# Patient Record
Sex: Female | Born: 1955
Health system: Southern US, Community
[De-identification: ages and names within clinical notes are randomized; demographics above are authoritative.]

## PROBLEM LIST (undated history)

## (undated) DIAGNOSIS — Z0000 Encounter for general adult medical examination without abnormal findings: Secondary | ICD-10-CM

## (undated) DIAGNOSIS — T7840XA Allergy, unspecified, initial encounter: Secondary | ICD-10-CM

## (undated) DIAGNOSIS — F418 Other specified anxiety disorders: Secondary | ICD-10-CM

## (undated) DIAGNOSIS — R911 Solitary pulmonary nodule: Secondary | ICD-10-CM

## (undated) DIAGNOSIS — E782 Mixed hyperlipidemia: Secondary | ICD-10-CM

## (undated) DIAGNOSIS — F419 Anxiety disorder, unspecified: Secondary | ICD-10-CM

## (undated) DIAGNOSIS — G43909 Migraine, unspecified, not intractable, without status migrainosus: Secondary | ICD-10-CM

## (undated) DIAGNOSIS — Z9289 Personal history of other medical treatment: Secondary | ICD-10-CM

## (undated) DIAGNOSIS — E663 Overweight: Secondary | ICD-10-CM

## (undated) DIAGNOSIS — F329 Major depressive disorder, single episode, unspecified: Secondary | ICD-10-CM

## (undated) DIAGNOSIS — F1010 Alcohol abuse, uncomplicated: Secondary | ICD-10-CM

## (undated) DIAGNOSIS — D689 Coagulation defect, unspecified: Secondary | ICD-10-CM

## (undated) DIAGNOSIS — Z9889 Other specified postprocedural states: Secondary | ICD-10-CM

## (undated) DIAGNOSIS — Z7189 Other specified counseling: Secondary | ICD-10-CM

## (undated) DIAGNOSIS — D693 Immune thrombocytopenic purpura: Secondary | ICD-10-CM

## (undated) DIAGNOSIS — K219 Gastro-esophageal reflux disease without esophagitis: Secondary | ICD-10-CM

## (undated) DIAGNOSIS — R112 Nausea with vomiting, unspecified: Secondary | ICD-10-CM

## (undated) DIAGNOSIS — M797 Fibromyalgia: Secondary | ICD-10-CM

## (undated) DIAGNOSIS — D110 Benign neoplasm of parotid gland: Secondary | ICD-10-CM

## (undated) HISTORY — DX: Encounter for general adult medical examination without abnormal findings: Z00.00

## (undated) HISTORY — DX: Other specified anxiety disorders: F41.8

## (undated) HISTORY — DX: Mixed hyperlipidemia: E78.2

## (undated) HISTORY — PX: LIPOMA EXCISION: SHX5283

## (undated) HISTORY — DX: Gastro-esophageal reflux disease without esophagitis: K21.9

## (undated) HISTORY — DX: Coagulation defect, unspecified: D68.9

## (undated) HISTORY — DX: Allergy, unspecified, initial encounter: T78.40XA

## (undated) HISTORY — DX: Benign neoplasm of parotid gland: D11.0

## (undated) HISTORY — PX: IRRIGATION AND DEBRIDEMENT ABSCESS: SHX5252

## (undated) HISTORY — DX: Immune thrombocytopenic purpura: D69.3

## (undated) HISTORY — PX: PAROTID GLAND TUMOR EXCISION: SHX5221

## (undated) HISTORY — DX: Overweight: E66.3

## (undated) HISTORY — DX: Other specified counseling: Z71.89

## (undated) HISTORY — DX: Solitary pulmonary nodule: R91.1

---

## 1993-12-03 HISTORY — PX: SPLENECTOMY, TOTAL: SHX788

## 1993-12-03 HISTORY — PX: TOTAL ABDOMINAL HYSTERECTOMY: SHX209

## 1993-12-03 HISTORY — PX: APPENDECTOMY: SHX54

## 2000-10-07 ENCOUNTER — Encounter: Payer: Self-pay | Admitting: Hematology & Oncology

## 2000-10-07 ENCOUNTER — Encounter: Admission: RE | Admit: 2000-10-07 | Discharge: 2000-10-07 | Payer: Self-pay | Admitting: Hematology & Oncology

## 2003-03-22 ENCOUNTER — Encounter: Payer: Self-pay | Admitting: Hematology & Oncology

## 2003-03-22 ENCOUNTER — Encounter: Admission: RE | Admit: 2003-03-22 | Discharge: 2003-03-22 | Payer: Self-pay | Admitting: Hematology & Oncology

## 2004-10-10 ENCOUNTER — Ambulatory Visit: Payer: Self-pay | Admitting: Hematology & Oncology

## 2005-01-01 ENCOUNTER — Encounter: Admission: RE | Admit: 2005-01-01 | Discharge: 2005-01-01 | Payer: Self-pay | Admitting: Hematology & Oncology

## 2005-05-31 ENCOUNTER — Ambulatory Visit: Payer: Self-pay | Admitting: Hematology & Oncology

## 2005-07-17 ENCOUNTER — Ambulatory Visit: Payer: Self-pay | Admitting: Hematology & Oncology

## 2005-11-15 ENCOUNTER — Ambulatory Visit: Payer: Self-pay | Admitting: Hematology & Oncology

## 2006-01-08 ENCOUNTER — Ambulatory Visit (HOSPITAL_COMMUNITY): Admission: RE | Admit: 2006-01-08 | Discharge: 2006-01-08 | Payer: Self-pay | Admitting: Otolaryngology

## 2006-01-09 ENCOUNTER — Ambulatory Visit: Payer: Self-pay | Admitting: Hematology & Oncology

## 2006-02-04 ENCOUNTER — Ambulatory Visit (HOSPITAL_COMMUNITY): Admission: RE | Admit: 2006-02-04 | Discharge: 2006-02-05 | Payer: Self-pay | Admitting: Otolaryngology

## 2006-02-04 ENCOUNTER — Encounter (INDEPENDENT_AMBULATORY_CARE_PROVIDER_SITE_OTHER): Payer: Self-pay | Admitting: *Deleted

## 2006-03-14 ENCOUNTER — Ambulatory Visit: Payer: Self-pay | Admitting: Hematology & Oncology

## 2006-03-15 LAB — CBC WITH DIFFERENTIAL/PLATELET
BASO%: 1.9 % (ref 0.0–2.0)
Basophils Absolute: 0.1 10*3/uL (ref 0.0–0.1)
Eosinophils Absolute: 0.1 10*3/uL (ref 0.0–0.5)
HCT: 38.3 % (ref 34.8–46.6)
HGB: 13.1 g/dL (ref 11.6–15.9)
LYMPH%: 31.1 % (ref 14.0–48.0)
MONO#: 0.8 10*3/uL (ref 0.1–0.9)
NEUT#: 4.1 10*3/uL (ref 1.5–6.5)
NEUT%: 54.3 % (ref 39.6–76.8)
Platelets: 22 10*3/uL — ABNORMAL LOW (ref 145–400)
WBC: 7.5 10*3/uL (ref 3.9–10.0)
lymph#: 2.3 10*3/uL (ref 0.9–3.3)

## 2006-03-15 LAB — URINALYSIS, MICROSCOPIC - CHCC
Bilirubin (Urine): NEGATIVE
Ketones: NEGATIVE mg/dL
Protein: NEGATIVE mg/dL
Specific Gravity, Urine: 1.015 (ref 1.003–1.035)
pH: 5 (ref 4.6–8.0)

## 2006-03-29 LAB — CBC WITH DIFFERENTIAL/PLATELET
BASO%: 1.6 % (ref 0.0–2.0)
HCT: 37.1 % (ref 34.8–46.6)
MCHC: 33.4 g/dL (ref 32.0–36.0)
MONO#: 0.7 10*3/uL (ref 0.1–0.9)
NEUT%: 49.5 % (ref 39.6–76.8)
RDW: 13.9 % (ref 11.3–14.5)
WBC: 6.8 10*3/uL (ref 3.9–10.0)
lymph#: 2.5 10*3/uL (ref 0.9–3.3)

## 2006-04-04 LAB — CBC WITH DIFFERENTIAL/PLATELET
BASO%: 0.7 % (ref 0.0–2.0)
Basophils Absolute: 0.1 10*3/uL (ref 0.0–0.1)
HCT: 35.9 % (ref 34.8–46.6)
HGB: 12.5 g/dL (ref 11.6–15.9)
LYMPH%: 34.4 % (ref 14.0–48.0)
MCHC: 34.9 g/dL (ref 32.0–36.0)
MONO#: 1.9 10*3/uL — ABNORMAL HIGH (ref 0.1–0.9)
NEUT%: 53.6 % (ref 39.6–76.8)
Platelets: 170 10*3/uL (ref 145–400)
WBC: 18.7 10*3/uL — ABNORMAL HIGH (ref 3.9–10.0)

## 2006-04-04 LAB — MORPHOLOGY: PLT EST: ADEQUATE

## 2006-04-12 LAB — CBC WITH DIFFERENTIAL/PLATELET
BASO%: 0.9 % (ref 0.0–2.0)
EOS%: 1.2 % (ref 0.0–7.0)
Eosinophils Absolute: 0.1 10*3/uL (ref 0.0–0.5)
MCV: 89.4 fL (ref 81.0–101.0)
MONO%: 12.5 % (ref 0.0–13.0)
NEUT#: 6.5 10*3/uL (ref 1.5–6.5)
RBC: 4.14 10*6/uL (ref 3.70–5.32)
RDW: 13.9 % (ref 11.3–14.5)

## 2006-04-12 LAB — MORPHOLOGY: PLT EST: DECREASED

## 2006-04-16 ENCOUNTER — Ambulatory Visit (HOSPITAL_COMMUNITY): Admission: RE | Admit: 2006-04-16 | Discharge: 2006-04-16 | Payer: Self-pay | Admitting: Hematology & Oncology

## 2006-04-18 LAB — CBC WITH DIFFERENTIAL/PLATELET
BASO%: 0.1 % (ref 0.0–2.0)
Basophils Absolute: 0 10e3/uL (ref 0.0–0.1)
EOS%: 0.1 % (ref 0.0–7.0)
Eosinophils Absolute: 0 10e3/uL (ref 0.0–0.5)
HCT: 39.8 % (ref 34.8–46.6)
HGB: 13.4 g/dL (ref 11.6–15.9)
LYMPH%: 9.7 % — ABNORMAL LOW (ref 14.0–48.0)
MCH: 30.2 pg (ref 26.0–34.0)
MCHC: 33.7 g/dL (ref 32.0–36.0)
MCV: 89.6 fL (ref 81.0–101.0)
MONO#: 0.1 10e3/uL (ref 0.1–0.9)
MONO%: 0.3 % (ref 0.0–13.0)
NEUT#: 16.1 10e3/uL — ABNORMAL HIGH (ref 1.5–6.5)
NEUT%: 89.8 % — ABNORMAL HIGH (ref 39.6–76.8)
Platelets: 192 10e3/uL (ref 145–400)
RBC: 4.45 10e6/uL (ref 3.70–5.32)
RDW: 14.1 % (ref 11.3–14.5)
WBC: 17.9 10e3/uL — ABNORMAL HIGH (ref 3.9–10.0)
lymph#: 1.7 10e3/uL (ref 0.9–3.3)

## 2006-04-18 LAB — MORPHOLOGY
PLT EST: ADEQUATE
RBC Comments: NORMAL

## 2006-04-26 LAB — CBC WITH DIFFERENTIAL/PLATELET
Basophils Absolute: 0.2 10*3/uL — ABNORMAL HIGH (ref 0.0–0.1)
Eosinophils Absolute: 0.3 10*3/uL (ref 0.0–0.5)
LYMPH%: 26.8 % (ref 14.0–48.0)
MCH: 30.2 pg (ref 26.0–34.0)
MCHC: 32.9 g/dL (ref 32.0–36.0)
MCV: 91.7 fL (ref 81.0–101.0)
RDW: 13.4 % (ref 11.3–14.5)
lymph#: 3.3 10*3/uL (ref 0.9–3.3)

## 2006-04-26 LAB — URINALYSIS, MICROSCOPIC - CHCC
Blood: NEGATIVE
Ketones: NEGATIVE mg/dL
Protein: NEGATIVE mg/dL
Specific Gravity, Urine: 1.01 (ref 1.003–1.035)
pH: 5 (ref 4.6–8.0)

## 2006-04-26 LAB — MORPHOLOGY: PLT EST: ADEQUATE

## 2006-04-27 ENCOUNTER — Ambulatory Visit: Payer: Self-pay | Admitting: Hematology & Oncology

## 2006-05-02 LAB — CBC WITH DIFFERENTIAL/PLATELET
Eosinophils Absolute: 0 10*3/uL (ref 0.0–0.5)
HCT: 38.7 % (ref 34.8–46.6)
LYMPH%: 5.9 % — ABNORMAL LOW (ref 14.0–48.0)
MONO#: 0.4 10*3/uL (ref 0.1–0.9)
NEUT#: 13.1 10*3/uL — ABNORMAL HIGH (ref 1.5–6.5)
NEUT%: 91.1 % — ABNORMAL HIGH (ref 39.6–76.8)
Platelets: 30 10*3/uL — ABNORMAL LOW (ref 145–400)
RBC: 4.3 10*6/uL (ref 3.70–5.32)
WBC: 14.4 10*3/uL — ABNORMAL HIGH (ref 3.9–10.0)

## 2006-05-02 LAB — CHCC SMEAR

## 2006-05-02 LAB — MORPHOLOGY: RBC Comments: NORMAL

## 2006-05-09 LAB — CBC WITH DIFFERENTIAL/PLATELET
BASO%: 0.9 % (ref 0.0–2.0)
Eosinophils Absolute: 0.2 10*3/uL (ref 0.0–0.5)
MCHC: 33.2 g/dL (ref 32.0–36.0)
MONO#: 1.2 10*3/uL — ABNORMAL HIGH (ref 0.1–0.9)
NEUT#: 4.8 10*3/uL (ref 1.5–6.5)
RBC: 4.29 10*6/uL (ref 3.70–5.32)
RDW: 12.4 % (ref 11.3–14.5)
WBC: 10.3 10*3/uL — ABNORMAL HIGH (ref 3.9–10.0)

## 2006-05-09 LAB — CHCC SMEAR

## 2006-05-09 LAB — MORPHOLOGY

## 2006-05-16 LAB — CBC WITH DIFFERENTIAL/PLATELET
Basophils Absolute: 0.2 10*3/uL — ABNORMAL HIGH (ref 0.0–0.1)
Eosinophils Absolute: 0.1 10*3/uL (ref 0.0–0.5)
HGB: 13.9 g/dL (ref 11.6–15.9)
MCV: 91 fL (ref 81.0–101.0)
MONO#: 1.5 10*3/uL — ABNORMAL HIGH (ref 0.1–0.9)
MONO%: 16.8 % — ABNORMAL HIGH (ref 0.0–13.0)
NEUT#: 4.9 10*3/uL (ref 1.5–6.5)
RBC: 4.6 10*6/uL (ref 3.70–5.32)
RDW: 12.4 % (ref 11.3–14.5)
WBC: 8.8 10*3/uL (ref 3.9–10.0)
lymph#: 2.1 10*3/uL (ref 0.9–3.3)

## 2006-05-16 LAB — CHCC SMEAR

## 2006-05-16 LAB — TECHNOLOGIST REVIEW

## 2006-05-23 LAB — CBC WITH DIFFERENTIAL/PLATELET
Basophils Absolute: 0.1 10*3/uL (ref 0.0–0.1)
EOS%: 1.9 % (ref 0.0–7.0)
Eosinophils Absolute: 0.1 10*3/uL (ref 0.0–0.5)
HCT: 40.1 % (ref 34.8–46.6)
HGB: 13.4 g/dL (ref 11.6–15.9)
MCH: 30.1 pg (ref 26.0–34.0)
MONO#: 0.7 10*3/uL (ref 0.1–0.9)
NEUT#: 2.5 10*3/uL (ref 1.5–6.5)
NEUT%: 43.2 % (ref 39.6–76.8)
RDW: 11.9 % (ref 11.3–14.5)
WBC: 5.9 10*3/uL (ref 3.9–10.0)
lymph#: 2.4 10*3/uL (ref 0.9–3.3)

## 2006-05-30 LAB — CBC WITH DIFFERENTIAL/PLATELET
Basophils Absolute: 0.1 10*3/uL (ref 0.0–0.1)
Eosinophils Absolute: 0.1 10*3/uL (ref 0.0–0.5)
HGB: 13.5 g/dL (ref 11.6–15.9)
LYMPH%: 43.3 % (ref 14.0–48.0)
MCV: 90.1 fL (ref 81.0–101.0)
MONO#: 0.8 10*3/uL (ref 0.1–0.9)
MONO%: 11.8 % (ref 0.0–13.0)
NEUT#: 2.9 10*3/uL (ref 1.5–6.5)
Platelets: 8 10*3/uL — CL (ref 145–400)
RDW: 12.2 % (ref 11.3–14.5)

## 2006-06-13 ENCOUNTER — Ambulatory Visit: Payer: Self-pay | Admitting: Hematology & Oncology

## 2006-06-13 LAB — CBC WITH DIFFERENTIAL/PLATELET
BASO%: 1.8 % (ref 0.0–2.0)
Basophils Absolute: 0.1 10*3/uL (ref 0.0–0.1)
EOS%: 1.4 % (ref 0.0–7.0)
HGB: 14.2 g/dL (ref 11.6–15.9)
MCH: 29.9 pg (ref 26.0–34.0)
MCHC: 33.5 g/dL (ref 32.0–36.0)
RBC: 4.74 10*6/uL (ref 3.70–5.32)
RDW: 12.2 % (ref 11.3–14.5)
lymph#: 3 10*3/uL (ref 0.9–3.3)

## 2006-06-13 LAB — CHCC SMEAR

## 2006-07-04 LAB — CBC WITH DIFFERENTIAL/PLATELET
BASO%: 1.9 % (ref 0.0–2.0)
EOS%: 1.6 % (ref 0.0–7.0)
HGB: 12.7 g/dL (ref 11.6–15.9)
MCH: 29.8 pg (ref 26.0–34.0)
MCHC: 33.5 g/dL (ref 32.0–36.0)
RDW: 13.6 % (ref 11.3–14.5)
WBC: 6.9 10*3/uL (ref 3.9–10.0)
lymph#: 2.9 10*3/uL (ref 0.9–3.3)

## 2006-07-04 LAB — CHCC SMEAR

## 2006-07-25 LAB — CBC WITH DIFFERENTIAL/PLATELET
Basophils Absolute: 0.1 10*3/uL (ref 0.0–0.1)
Eosinophils Absolute: 0 10*3/uL (ref 0.0–0.5)
HGB: 13.6 g/dL (ref 11.6–15.9)
MCV: 89.3 fL (ref 81.0–101.0)
NEUT#: 3.4 10*3/uL (ref 1.5–6.5)
RDW: 13.3 % (ref 11.3–14.5)
lymph#: 2.5 10*3/uL (ref 0.9–3.3)

## 2006-08-13 ENCOUNTER — Ambulatory Visit: Payer: Self-pay | Admitting: Hematology & Oncology

## 2006-08-15 LAB — CBC WITH DIFFERENTIAL/PLATELET
Eosinophils Absolute: 0 10*3/uL (ref 0.0–0.5)
HCT: 38.8 % (ref 34.8–46.6)
HGB: 13.4 g/dL (ref 11.6–15.9)
LYMPH%: 25.9 % (ref 14.0–48.0)
MONO#: 0.7 10*3/uL (ref 0.1–0.9)
NEUT#: 4.5 10*3/uL (ref 1.5–6.5)
NEUT%: 63.5 % (ref 39.6–76.8)
Platelets: 6 10*3/uL — CL (ref 145–400)
WBC: 7 10*3/uL (ref 3.9–10.0)

## 2006-08-15 LAB — CHCC SMEAR

## 2006-08-27 LAB — CBC WITH DIFFERENTIAL/PLATELET
BASO%: 0.9 % (ref 0.0–2.0)
EOS%: 1.4 % (ref 0.0–7.0)
HCT: 38.3 % (ref 34.8–46.6)
MCH: 29.9 pg (ref 26.0–34.0)
MCHC: 34 g/dL (ref 32.0–36.0)
MONO%: 12.2 % (ref 0.0–13.0)
NEUT%: 52.4 % (ref 39.6–76.8)
lymph#: 2.4 10*3/uL (ref 0.9–3.3)

## 2006-10-07 ENCOUNTER — Ambulatory Visit: Payer: Self-pay | Admitting: Hematology & Oncology

## 2006-10-09 LAB — CBC WITH DIFFERENTIAL/PLATELET
BASO%: 1.1 % (ref 0.0–2.0)
EOS%: 1.5 % (ref 0.0–7.0)
Eosinophils Absolute: 0.1 10*3/uL (ref 0.0–0.5)
LYMPH%: 30.5 % (ref 14.0–48.0)
MCH: 29.8 pg (ref 26.0–34.0)
MCHC: 33.7 g/dL (ref 32.0–36.0)
MCV: 88.2 fL (ref 81.0–101.0)
MONO%: 14.2 % — ABNORMAL HIGH (ref 0.0–13.0)
Platelets: 54 10*3/uL — ABNORMAL LOW (ref 145–400)
RBC: 4.69 10*6/uL (ref 3.70–5.32)
RDW: 13.4 % (ref 11.3–14.5)

## 2006-10-09 LAB — CHCC SMEAR

## 2006-10-30 LAB — CBC WITH DIFFERENTIAL/PLATELET
BASO%: 1.2 % (ref 0.0–2.0)
EOS%: 1.3 % (ref 0.0–7.0)
HCT: 38.5 % (ref 34.8–46.6)
MCH: 30.1 pg (ref 26.0–34.0)
MCHC: 34.2 g/dL (ref 32.0–36.0)
NEUT%: 54.1 % (ref 39.6–76.8)
RBC: 4.37 10*6/uL (ref 3.70–5.32)
lymph#: 1.9 10*3/uL (ref 0.9–3.3)

## 2006-11-20 LAB — CBC WITH DIFFERENTIAL/PLATELET
BASO%: 0.9 % (ref 0.0–2.0)
Basophils Absolute: 0.1 10*3/uL (ref 0.0–0.1)
EOS%: 2.1 % (ref 0.0–7.0)
HCT: 41.6 % (ref 34.8–46.6)
HGB: 13.9 g/dL (ref 11.6–15.9)
LYMPH%: 19 % (ref 14.0–48.0)
MCH: 29.7 pg (ref 26.0–34.0)
MCHC: 33.4 g/dL (ref 32.0–36.0)
MCV: 89 fL (ref 81.0–101.0)
MONO%: 11.8 % (ref 0.0–13.0)
NEUT%: 66.2 % (ref 39.6–76.8)

## 2006-12-03 HISTORY — PX: COLONOSCOPY: SHX174

## 2006-12-13 ENCOUNTER — Ambulatory Visit: Payer: Self-pay | Admitting: Hematology & Oncology

## 2006-12-17 LAB — CBC WITH DIFFERENTIAL/PLATELET
BASO%: 0.6 % (ref 0.0–2.0)
Eosinophils Absolute: 0.1 10*3/uL (ref 0.0–0.5)
HCT: 39.6 % (ref 34.8–46.6)
MCHC: 33.7 g/dL (ref 32.0–36.0)
MONO#: 0.9 10*3/uL (ref 0.1–0.9)
NEUT#: 4.1 10*3/uL (ref 1.5–6.5)
NEUT%: 54.6 % (ref 39.6–76.8)
Platelets: 74 10*3/uL — ABNORMAL LOW (ref 145–400)
RBC: 4.45 10*6/uL (ref 3.70–5.32)
WBC: 7.4 10*3/uL (ref 3.9–10.0)
lymph#: 2.4 10*3/uL (ref 0.9–3.3)

## 2006-12-17 LAB — CHCC SMEAR

## 2007-01-22 LAB — CBC WITH DIFFERENTIAL/PLATELET
Eosinophils Absolute: 0.1 10*3/uL (ref 0.0–0.5)
HCT: 39.9 % (ref 34.8–46.6)
HGB: 13.7 g/dL (ref 11.6–15.9)
LYMPH%: 29 % (ref 14.0–48.0)
MONO#: 1.2 10*3/uL — ABNORMAL HIGH (ref 0.1–0.9)
NEUT#: 4 10*3/uL (ref 1.5–6.5)
NEUT%: 52 % (ref 39.6–76.8)
Platelets: 159 10*3/uL (ref 145–400)
WBC: 7.8 10*3/uL (ref 3.9–10.0)
lymph#: 2.3 10*3/uL (ref 0.9–3.3)

## 2007-03-03 ENCOUNTER — Ambulatory Visit: Payer: Self-pay | Admitting: Hematology & Oncology

## 2007-03-05 LAB — CBC WITH DIFFERENTIAL/PLATELET
Basophils Absolute: 0.1 10*3/uL (ref 0.0–0.1)
EOS%: 1 % (ref 0.0–7.0)
Eosinophils Absolute: 0.1 10*3/uL (ref 0.0–0.5)
HCT: 39 % (ref 34.8–46.6)
HGB: 13.4 g/dL (ref 11.6–15.9)
MCH: 30.6 pg (ref 26.0–34.0)
MCV: 88.8 fL (ref 81.0–101.0)
MONO%: 14 % — ABNORMAL HIGH (ref 0.0–13.0)
NEUT#: 3.4 10*3/uL (ref 1.5–6.5)
NEUT%: 51.2 % (ref 39.6–76.8)
lymph#: 2.2 10*3/uL (ref 0.9–3.3)

## 2007-03-26 LAB — CBC WITH DIFFERENTIAL/PLATELET
Eosinophils Absolute: 0.1 10*3/uL (ref 0.0–0.5)
HCT: 38.3 % (ref 34.8–46.6)
LYMPH%: 36.3 % (ref 14.0–48.0)
MCV: 88.9 fL (ref 81.0–101.0)
MONO#: 0.9 10*3/uL (ref 0.1–0.9)
MONO%: 13.6 % — ABNORMAL HIGH (ref 0.0–13.0)
NEUT#: 3.1 10*3/uL (ref 1.5–6.5)
NEUT%: 47.4 % (ref 39.6–76.8)
Platelets: 74 10*3/uL — ABNORMAL LOW (ref 145–400)
RBC: 4.31 10*6/uL (ref 3.70–5.32)
WBC: 6.5 10*3/uL (ref 3.9–10.0)

## 2007-04-24 ENCOUNTER — Encounter: Admission: RE | Admit: 2007-04-24 | Discharge: 2007-04-24 | Payer: Self-pay | Admitting: Hematology & Oncology

## 2007-04-30 ENCOUNTER — Ambulatory Visit: Payer: Self-pay | Admitting: Hematology & Oncology

## 2007-05-02 LAB — CHCC SMEAR

## 2007-05-02 LAB — CBC WITH DIFFERENTIAL/PLATELET
BASO%: 0.8 % (ref 0.0–2.0)
EOS%: 1.4 % (ref 0.0–7.0)
HCT: 38.6 % (ref 34.8–46.6)
LYMPH%: 29.1 % (ref 14.0–48.0)
MCH: 30.7 pg (ref 26.0–34.0)
MCHC: 34.8 g/dL (ref 32.0–36.0)
MCV: 88.2 fL (ref 81.0–101.0)
MONO%: 15 % — ABNORMAL HIGH (ref 0.0–13.0)
NEUT%: 53.7 % (ref 39.6–76.8)
Platelets: 63 10*3/uL — ABNORMAL LOW (ref 145–400)
RBC: 4.37 10*6/uL (ref 3.70–5.32)
WBC: 6.5 10*3/uL (ref 3.9–10.0)

## 2007-06-16 ENCOUNTER — Ambulatory Visit: Payer: Self-pay | Admitting: Hematology & Oncology

## 2007-06-18 LAB — CBC WITH DIFFERENTIAL/PLATELET
EOS%: 1.3 % (ref 0.0–7.0)
MCH: 31 pg (ref 26.0–34.0)
MCV: 87.7 fL (ref 81.0–101.0)
MONO%: 11.9 % (ref 0.0–13.0)
NEUT#: 4.1 10*3/uL (ref 1.5–6.5)
RBC: 4.31 10*6/uL (ref 3.70–5.32)
RDW: 13 % (ref 11.3–14.5)

## 2007-07-09 ENCOUNTER — Observation Stay (HOSPITAL_COMMUNITY): Admission: EM | Admit: 2007-07-09 | Discharge: 2007-07-10 | Payer: Self-pay | Admitting: Emergency Medicine

## 2007-08-13 ENCOUNTER — Other Ambulatory Visit: Payer: Self-pay | Admitting: Emergency Medicine

## 2007-08-14 ENCOUNTER — Inpatient Hospital Stay (HOSPITAL_COMMUNITY): Admission: AD | Admit: 2007-08-14 | Discharge: 2007-08-16 | Payer: Self-pay | Admitting: Surgery

## 2007-10-01 ENCOUNTER — Ambulatory Visit: Payer: Self-pay | Admitting: Hematology & Oncology

## 2007-10-03 LAB — CBC WITH DIFFERENTIAL/PLATELET
Eosinophils Absolute: 0.1 10*3/uL (ref 0.0–0.5)
MONO#: 0.9 10*3/uL (ref 0.1–0.9)
NEUT#: 3.9 10*3/uL (ref 1.5–6.5)
RBC: 4.4 10*6/uL (ref 3.70–5.32)
RDW: 13.4 % (ref 11.3–14.5)
WBC: 6.9 10*3/uL (ref 3.9–10.0)

## 2007-10-03 LAB — CHCC SMEAR

## 2007-11-20 ENCOUNTER — Ambulatory Visit: Payer: Self-pay | Admitting: Hematology & Oncology

## 2007-11-24 LAB — CHCC SMEAR

## 2007-11-24 LAB — CBC WITH DIFFERENTIAL/PLATELET
BASO%: 0.4 % (ref 0.0–2.0)
HCT: 41 % (ref 34.8–46.6)
HGB: 13.9 g/dL (ref 11.6–15.9)
MCHC: 34 g/dL (ref 32.0–36.0)
MONO#: 1 10*3/uL — ABNORMAL HIGH (ref 0.1–0.9)
NEUT%: 52.4 % (ref 39.6–76.8)
WBC: 7.2 10*3/uL (ref 3.9–10.0)
lymph#: 2.4 10*3/uL (ref 0.9–3.3)

## 2008-01-21 ENCOUNTER — Ambulatory Visit: Payer: Self-pay | Admitting: Hematology & Oncology

## 2008-01-26 LAB — COMPREHENSIVE METABOLIC PANEL
Alkaline Phosphatase: 100 U/L (ref 39–117)
CO2: 25 mEq/L (ref 19–32)
Calcium: 9.5 mg/dL (ref 8.4–10.5)
Creatinine, Ser: 0.66 mg/dL (ref 0.40–1.20)
Glucose, Bld: 83 mg/dL (ref 70–99)
Potassium: 4.4 mEq/L (ref 3.5–5.3)
Sodium: 138 mEq/L (ref 135–145)
Total Bilirubin: 0.4 mg/dL (ref 0.3–1.2)

## 2008-01-26 LAB — CBC WITH DIFFERENTIAL/PLATELET
Eosinophils Absolute: 0.1 10*3/uL (ref 0.0–0.5)
MCH: 30.2 pg (ref 26.0–34.0)
MCHC: 34.4 g/dL (ref 32.0–36.0)
MCV: 87.9 fL (ref 81.0–101.0)
MONO#: 0.8 10*3/uL (ref 0.1–0.9)
MONO%: 12.3 % (ref 0.0–13.0)
Platelets: 52 10*3/uL — ABNORMAL LOW (ref 145–400)
RBC: 4.52 10*6/uL (ref 3.70–5.32)
lymph#: 2 10*3/uL (ref 0.9–3.3)

## 2008-01-26 LAB — TSH: TSH: 2.587 u[IU]/mL (ref 0.350–5.500)

## 2008-03-16 ENCOUNTER — Ambulatory Visit: Payer: Self-pay | Admitting: Hematology & Oncology

## 2008-03-16 LAB — CBC WITH DIFFERENTIAL/PLATELET
Eosinophils Absolute: 0.2 10*3/uL (ref 0.0–0.5)
HGB: 13 g/dL (ref 11.6–15.9)
LYMPH%: 33.2 % (ref 14.0–48.0)
MCH: 30.7 pg (ref 26.0–34.0)
MCHC: 34.2 g/dL (ref 32.0–36.0)
MCV: 89.6 fL (ref 81.0–101.0)
MONO#: 0.9 10*3/uL (ref 0.1–0.9)
NEUT#: 3.6 10*3/uL (ref 1.5–6.5)
NEUT%: 50.3 % (ref 39.6–76.8)
RBC: 4.25 10*6/uL (ref 3.70–5.32)
RDW: 13.7 % (ref 11.3–14.5)
lymph#: 2.4 10*3/uL (ref 0.9–3.3)

## 2008-04-05 LAB — CBC WITH DIFFERENTIAL/PLATELET
BASO%: 1.7 % (ref 0.0–2.0)
Basophils Absolute: 0.1 10*3/uL (ref 0.0–0.1)
Eosinophils Absolute: 0.2 10*3/uL (ref 0.0–0.5)
MCH: 30.5 pg (ref 26.0–34.0)
RBC: 4.22 10*6/uL (ref 3.70–5.32)
lymph#: 2.8 10*3/uL (ref 0.9–3.3)

## 2008-05-03 ENCOUNTER — Ambulatory Visit: Payer: Self-pay | Admitting: Hematology & Oncology

## 2008-05-07 ENCOUNTER — Encounter: Admission: RE | Admit: 2008-05-07 | Discharge: 2008-05-07 | Payer: Self-pay | Admitting: Hematology & Oncology

## 2008-06-01 LAB — COMPREHENSIVE METABOLIC PANEL
ALT: 17 U/L (ref 0–35)
AST: 19 U/L (ref 0–37)
Alkaline Phosphatase: 107 U/L (ref 39–117)
BUN: 17 mg/dL (ref 6–23)
CO2: 25 mEq/L (ref 19–32)
Chloride: 104 mEq/L (ref 96–112)
Glucose, Bld: 96 mg/dL (ref 70–99)
Sodium: 140 mEq/L (ref 135–145)

## 2008-06-01 LAB — CBC WITH DIFFERENTIAL/PLATELET
Basophils Absolute: 0.1 10*3/uL (ref 0.0–0.1)
Eosinophils Absolute: 0.2 10*3/uL (ref 0.0–0.5)
HCT: 39.9 % (ref 34.8–46.6)
LYMPH%: 26.5 % (ref 14.0–48.0)
MCH: 31 pg (ref 26.0–34.0)
MCHC: 34.3 g/dL (ref 32.0–36.0)
MCV: 90.3 fL (ref 81.0–101.0)
MONO#: 0.7 10*3/uL (ref 0.1–0.9)
MONO%: 9.6 % (ref 0.0–13.0)
NEUT#: 4.2 10*3/uL (ref 1.5–6.5)
Platelets: 57 10*3/uL — ABNORMAL LOW (ref 145–400)
WBC: 6.9 10*3/uL (ref 3.9–10.0)
lymph#: 1.8 10*3/uL (ref 0.9–3.3)

## 2008-10-01 ENCOUNTER — Ambulatory Visit: Payer: Self-pay | Admitting: Hematology & Oncology

## 2008-10-04 LAB — CBC WITH DIFFERENTIAL (CANCER CENTER ONLY)
BASO#: 0.1 10*3/uL (ref 0.0–0.2)
Eosinophils Absolute: 0.2 10*3/uL (ref 0.0–0.5)
HGB: 13.2 g/dL (ref 11.6–15.9)
LYMPH%: 32 % (ref 14.0–48.0)
MCH: 30.1 pg (ref 26.0–34.0)
MCV: 90 fL (ref 81–101)
MONO%: 10.6 % (ref 0.0–13.0)
NEUT%: 53.8 % (ref 39.6–80.0)
RBC: 4.39 10*6/uL (ref 3.70–5.32)

## 2008-10-04 LAB — CMP (CANCER CENTER ONLY)
AST: 24 U/L (ref 11–38)
BUN, Bld: 15 mg/dL (ref 7–22)
CO2: 31 mEq/L (ref 18–33)
Total Bilirubin: 0.5 mg/dl (ref 0.20–1.60)

## 2008-12-10 ENCOUNTER — Ambulatory Visit: Payer: Self-pay | Admitting: Hematology & Oncology

## 2008-12-10 LAB — CMP (CANCER CENTER ONLY)
BUN, Bld: 16 mg/dL (ref 7–22)
CO2: 29 mEq/L (ref 18–33)
Chloride: 101 mEq/L (ref 98–108)
Creat: 0.7 mg/dl (ref 0.6–1.2)
Glucose, Bld: 97 mg/dL (ref 73–118)
Total Bilirubin: 0.6 mg/dl (ref 0.20–1.60)

## 2008-12-10 LAB — CBC WITH DIFFERENTIAL (CANCER CENTER ONLY)
BASO#: 0.1 10*3/uL (ref 0.0–0.2)
Eosinophils Absolute: 0.2 10*3/uL (ref 0.0–0.5)
HGB: 13.5 g/dL (ref 11.6–15.9)
MCH: 30.9 pg (ref 26.0–34.0)
MONO%: 10.6 % (ref 0.0–13.0)
NEUT#: 3.6 10*3/uL (ref 1.5–6.5)
RBC: 4.37 10*6/uL (ref 3.70–5.32)

## 2009-02-04 ENCOUNTER — Ambulatory Visit: Payer: Self-pay | Admitting: Hematology & Oncology

## 2009-02-04 LAB — URINALYSIS, MICROSCOPIC (CHCC SATELLITE)
Leukocyte Esterase: NEGATIVE
Nitrite: NEGATIVE
Protein: NEGATIVE mg/dL
Specific Gravity, Urine: 1.01 (ref 1.003–1.035)
pH: 5 (ref 4.60–8.00)

## 2009-02-06 LAB — URINE CULTURE

## 2009-03-15 LAB — CBC WITH DIFFERENTIAL (CANCER CENTER ONLY)
BASO#: 0 10*3/uL (ref 0.0–0.2)
EOS%: 2.7 % (ref 0.0–7.0)
Eosinophils Absolute: 0.1 10*3/uL (ref 0.0–0.5)
HGB: 13.2 g/dL (ref 11.6–15.9)
LYMPH#: 1.7 10*3/uL (ref 0.9–3.3)
MCHC: 32 g/dL (ref 32.0–36.0)
MONO#: 0.4 10*3/uL (ref 0.1–0.9)
NEUT#: 2.4 10*3/uL (ref 1.5–6.5)
RBC: 4.43 10*6/uL (ref 3.70–5.32)
WBC: 4.6 10*3/uL (ref 3.9–10.0)

## 2009-05-09 ENCOUNTER — Encounter: Admission: RE | Admit: 2009-05-09 | Discharge: 2009-05-09 | Payer: Self-pay | Admitting: Hematology & Oncology

## 2009-12-06 ENCOUNTER — Ambulatory Visit: Payer: Self-pay | Admitting: Hematology & Oncology

## 2009-12-07 ENCOUNTER — Ambulatory Visit: Payer: Self-pay | Admitting: Radiology

## 2009-12-07 ENCOUNTER — Ambulatory Visit (HOSPITAL_BASED_OUTPATIENT_CLINIC_OR_DEPARTMENT_OTHER): Admission: RE | Admit: 2009-12-07 | Discharge: 2009-12-07 | Payer: Self-pay | Admitting: Hematology & Oncology

## 2009-12-07 LAB — CBC WITH DIFFERENTIAL (CANCER CENTER ONLY)
LYMPH#: 2.5 10*3/uL (ref 0.9–3.3)
LYMPH%: 57.2 % — ABNORMAL HIGH (ref 14.0–48.0)
MCHC: 33.6 g/dL (ref 32.0–36.0)
MCV: 91 fL (ref 81–101)
MONO%: 15.9 % — ABNORMAL HIGH (ref 0.0–13.0)
NEUT%: 19 % — ABNORMAL LOW (ref 39.6–80.0)
RBC: 4.09 10*6/uL (ref 3.70–5.32)
RDW: 11.2 % (ref 10.5–14.6)

## 2009-12-19 LAB — CBC WITH DIFFERENTIAL (CANCER CENTER ONLY)
BASO#: 0.1 10*3/uL (ref 0.0–0.2)
Eosinophils Absolute: 0.2 10*3/uL (ref 0.0–0.5)
HGB: 12.8 g/dL (ref 11.6–15.9)
LYMPH#: 2.6 10*3/uL (ref 0.9–3.3)
MCH: 30.1 pg (ref 26.0–34.0)
MCHC: 33.1 g/dL (ref 32.0–36.0)
MCV: 91 fL (ref 81–101)
MONO#: 0.5 10*3/uL (ref 0.1–0.9)
NEUT#: 2.4 10*3/uL (ref 1.5–6.5)
NEUT%: 42.6 % (ref 39.6–80.0)
Platelets: 135 10*3/uL — ABNORMAL LOW (ref 145–400)
RBC: 4.25 10*6/uL (ref 3.70–5.32)

## 2009-12-19 LAB — CHCC SATELLITE - SMEAR

## 2010-06-20 ENCOUNTER — Encounter: Admission: RE | Admit: 2010-06-20 | Discharge: 2010-06-20 | Payer: Self-pay | Admitting: Hematology & Oncology

## 2010-12-03 HISTORY — PX: BREAST BIOPSY: SHX20

## 2011-04-17 NOTE — Op Note (Signed)
Laura Bender, OPPERMAN             ACCOUNT NO.:  [id number]   MEDICAL RECORD NO.:  [id number]          PATIENT TYPE:  INP   LOCATION:  NA                           FACILITY:  [facility]   PHYSICIAN:  Thomas A. Cornett, M.D.DATE OF BIRTH:  16-Sep-1956   DATE OF PROCEDURE:  08/15/2007  DATE OF DISCHARGE:                               OPERATIVE REPORT   PREOPERATIVE DIAGNOSIS:  Right buttock abscess.   POSTOPERATIVE DIAGNOSIS:  Right buttock abscess.   PROCEDURE:  Incision and drainage of complex right buttock abscess.   SURGEON:  Maisie Fus A. Cornett, M.D.   ANESTHESIA:  General endotracheal anesthesia 30 mL of 0.25% Sensorcaine  with epinephrine.   ESTIMATED BLOOD LOSS:  50 mL.   SPECIMEN:  Cultures of wound sent to microbiology.   INDICATIONS FOR PROCEDURE:  The patient is a 55 year old female who has  had a history of a perirectal abscess about a month ago.  She presented  yesterday with swelling of her right buttock well away from her previous  I&D site.  This was a large right complex buttock abscess.  An attempt  was made to drain her in the emergency room by the emergency room nurse  practitioner but this was unsuccessful.  She was admitted last night and  brought to the operating room for further opening of this wound and  break up of the loculations from the abscess cavity.   DESCRIPTION OF PROCEDURE:  The patient was brought to the operating room  and placed supine.  After induction of general anesthesia, she was  placed prone.  Her right buttock was prepped and draped in a sterile  fashion.  A 12 x 12 cm area of induration was noted and a small area of  the wound was opened centrally.  A piece of packing was in this and I  removed this.  I used a scalpel to open this wider after sterile prep  and drape.  I broke up all loculations in the right buttock and excised  some excess skin over it that was indurated.  This was packed with a  saline soaked Kerlix.  Dry dressings were  applied.  This was a complex  abscess due to the multilocular nature of it.  An ABD pad was placed.  She was then placed supine, extubated, and taken to recovery in  satisfactory condition.      Thomas A. Cornett, M.D.  Electronically Signed     TAC/MEDQ  D:  08/15/2007  T:  08/16/2007  Job:  [id number]

## 2011-04-17 NOTE — Op Note (Signed)
Laura Bender, Laura Bender             ACCOUNT NO.:  [id number]   MEDICAL RECORD NO.:  [id number]          PATIENT TYPE:  INP   LOCATION:  1308                         FACILITY:  [facility]   PHYSICIAN:  Clovis Pu. Cornett, M.D.DATE OF BIRTH:  07-12-1956   DATE OF PROCEDURE:  07/10/2007  DATE OF DISCHARGE:                               OPERATIVE REPORT   PREOP DIAGNOSIS:  Perirectal abscess.   POSTOP DIAGNOSIS:  Perirectal abscess.   PROCEDURE:  Incision and drainage of perirectal abscess.   SURGEON:  Harriette Bouillon, M.D.   ANESTHESIA:  General endotracheal anesthesia with 20 mL of 0.25%  Sensorcaine.   EBL:  50 mL.   DRAINS:  None.   Cultures taken.   INDICATIONS FOR PROCEDURE:  The patient is a 55 year old female who  developed left buttock pain.  She also had some perianal pain as well.  She was brought to the operating room on examination due to redness,  induration actually involving both buttocks.  This was more left than  right initially.  Concern was for a perirectal abscess.   DESCRIPTION OF PROCEDURE:  Patient was brought to the operating room and  placed supine.  After induction of general anesthesia patient was placed  in lithotomy.  The perianal region was prepped and draped in a sterile  fashion.  Involving the posterior area of the anal canal, both left and  right, were indurated areas.  I made an incision initially on the right  side and this now appeared more red than earlier before and very  indurated.  This was about 4 cm to the right posterior of the anal canal  verge.  Once I opened this area up there was some dishwater colored  fluid that I encountered.  I used my finger to break up some loculations  and cultures were taken.  A similar area on the left buttock was chosen,  this was opened separately.  We opened this and probed it and actually  this area did not have any pus but just some induration.  Rectal  examination was done and it was normal.  There was a  small opening just  proximal to the right lateral anal canal region that I probed with a  small probe.  This was part of the abscess  involving the right buttock toward the right inguinal canal.  One-inch  packing was used to pack both wounds.  Hemostasis was excellent.  Dry  dressings were applied.  All final counts were found to be correct,  sponge, needle, instruments.  The patient was awoke, taken to recovery  in satisfactory condition.      Thomas A. Cornett, M.D.  Electronically Signed     TAC/MEDQ  D:  07/10/2007  T:  07/10/2007  Job:  [id number]

## 2011-04-17 NOTE — Discharge Summary (Signed)
Laura Bender, Laura Bender             ACCOUNT NO.:  [id number]   MEDICAL RECORD NO.:  [id number]          PATIENT TYPE:  INP   LOCATION:  5712                         FACILITY:  [facility]   PHYSICIAN:  Maisie Fus A. Cornett, M.D.DATE OF BIRTH:  05-22-1956   DATE OF ADMISSION:  08/14/2007  DATE OF DISCHARGE:  08/16/2007                               DISCHARGE SUMMARY   ADMITTING DIAGNOSIS:  Right buttock abscess.   DISCHARGE DIAGNOSIS:  Right buttock abscess.   PROCEDURES PERFORMED:  Incision and drainage right buttock abscess.   BRIEF HISTORY:  Patient is a pleasant 55 year old female who I saw about  a month ago for a perirectal abscess.  This was drained and healed quite  nicely.  She developed a new abscess over the central portion of her  right buttock and was seen by me in my office on Thursday.  She was  admitted Thursday night, placed on IV antibiotics, taken to the  operating room on Friday for incision and drainage and packing of this  wound.  Today, she is doing well.  The wound is well dressed, clean.  No  fevers or chills overnight.  Cultures are pending.  The initial report  was a staphylococcus aureus, but this did not appear to be a MRSA.  She  has been on Vancomycin; will switch her to doxycycline.  She will be  discharged home today on doxycycline 100 mg p.o. b.i.d. and Percocet for  pain.  She has significant other who will help her change her wound to  her buttock and she is comfortable with that; will not need home health.  I will see her back in seven to ten days.   CONDITION AT DISCHARGE:  Improved.   DISCHARGE MEDICATIONS:  1. Doxycycline 100 mg p.o. b.i.d.  2. Percocet one to two tabs q.4 p.r.n. pain.      Thomas A. Cornett, M.D.  Electronically Signed     TAC/MEDQ  D:  08/16/2007  T:  08/16/2007  Job:  [id number]

## 2011-04-17 NOTE — Discharge Summary (Signed)
[name], Laura Bender             ACCOUNT NO.:  [id number]   MEDICAL RECORD NO.:  [id number]          PATIENT TYPE:  INP   LOCATION:  1308                         FACILITY:  [facility]   PHYSICIAN:  Clovis Pu. Cornett, M.D.DATE OF BIRTH:  10-31-1956   DATE OF ADMISSION:  07/09/2007  DATE OF DISCHARGE:  07/10/2007                               DISCHARGE SUMMARY   ADMITTING DIAGNOSIS:  Perirectal abscess.   DISCHARGE DIAGNOSIS:  Perirectal abscess.   PROCEDURE PERFORMED:  I&D perirectal abscess.   BRIEF HISTORY:  The patient is a 55 year old female admitted for  perirectal pain, swelling and induration.  History of ITP.  She was  brought to the operating room on July 09, 2007, for incision and  drainage of perirectal abscess.   HOSPITAL COURSE:  The patient's hospital course was unremarkable.  She  felt well on postop day 1, was not febrile and wished to go home.  She  is discharged home on postop day 1 in improved condition.   DISCHARGE INSTRUCTIONS:  1. Home Health will be arranged for packing changes to her buttock      wounds, which are 2 of them.  2. She will be switched over to Augmentin until her final cultures      come back and Cipro and Flagyl were stopped.  3. She will be given a script for pain medicine and follow up in 5-7      days with me.      Thomas A. Cornett, M.D.  Electronically Signed     TAC/MEDQ  D:  07/10/2007  T:  07/10/2007  Job:  [id number]

## 2011-04-17 NOTE — H&P (Signed)
Laura Bender, Laura Bender             ACCOUNT NO.:  [id number]   MEDICAL RECORD NO.:  [id number]          PATIENT TYPE:  EMS   LOCATION:  ED                           FACILITY:  [facility]   PHYSICIAN:  Clovis Pu. Cornett, M.D.DATE OF BIRTH:  10/20/1956   DATE OF ADMISSION:  07/09/2007  DATE OF DISCHARGE:                              HISTORY & PHYSICAL   CHIEF COMPLAINT:  Left buttock pain.   HISTORY OF PRESENT ILLNESS:  The patient is a 55 year old female with  past history of anal fissure and ITP with a 2-day history of left  buttock pain and swelling.  He states it started yesterday.  She had  some mild discomfort over her left buttock and then today the area  became swollen, red and very tender with a level of 8/10 pain in the  left buttock without radiation.  Pain made worse by sitting on it, made  better by not sitting on it.  Denies any drainage, but has had fever and  chills associated with it.  She has a history of anal fissure followed  by Dr. Abbey Chatters.  Denies any history of constipation.  Her ITP was  treated by splenectomy, but she runs counts of platelets in the 70,000s.  She states it is stable.  Dr. Arlan Organ follows her for that.   PAST MEDICAL HISTORY:  1. ITP status post splenectomy.  2. History of anal fissure controlled medically.   FAMILY HISTORY:  Positive for type 2 diabetes mellitus.   SURGICAL HISTORY:  Splenectomy.   SOCIAL HISTORY:  Denies tobacco or alcohol use.   ALLERGIES TO MEDICINES:  None.   MEDICATIONS:  1. Premarin.  2. Prozac.   REVIEW OF SYSTEMS:  A 15-point review of systems was reviewed with the  patient.  It is negative, otherwise for that stated above.   PHYSICAL EXAMINATION:  VITAL SIGNS:  Temperature 98, pulse 111, blood  pressure 117/51.  GENERAL APPEARANCE:  A pleasant female in no apparent distress.  HEENT:  Extraocular movements are intact.  No evidence of scleral  icterus.  Mucous membranes are moist.  NECK:  Supple,  nontender.  No mass.  Trachea midline.  No  lymphadenopathy.  PULMONARY:  Lungs are clear to auscultation.  CHEST:  Chest wall motion is normal.  CARDIOVASCULAR:  Regular rate and rhythm without rub, murmur or gallop.  EXTREMITIES:  Well-perfused and warm.  ABDOMEN:  Soft, nontender.  Previous surgical scar is noted.  No mass.  GU:  Left buttock shows significant swelling and inflammation tracked  down to the left anal verge, tender and fluctuant on examination.  The  entire area measures roughly 10 x 12 cm.  RECTAL:  The rectal examination was not repeated due to patient  discomfort.  No evidence of drainage.  EXTREMITIES:  Muscle tone normal.  Range of motion normal.  NEUROLOGIC:  Motor and sensory function are grossly intact.  Glasgow  coma scale is 15.   DIAGNOSTIC STUDIES:  White count is 20,000 with left shift.  Platelet  count 71,000, hemoglobin 13.9.  There is a left shift.  Sodium 138,  potassium 3.6, chloride 103, CO2 of 28, BUN 10, creatinine 0.6, glucose  100.  PT/INR is normal at 1 and 30.   IMPRESSION:  1. Left block perirectal abscess.  2. History of idiopathic thrombocytopenic purpura, stable.   PLAN:  Ms. Morlock needs to have this area drained in the operating  room.  Will go ahead and schedule for incision and drainage of  perirectal abscess today.  Will place her on antibiotics and admit her  to the hospital for IV fluids and the above.  Risks of bleeding,  infection and fistula in ano were discussed with the patient.  She  understands the above and agrees to proceed.      Thomas A. Cornett, M.D.  Electronically Signed     TAC/MEDQ  D:  07/09/2007  T:  07/09/2007  Job:  [id number]

## 2011-04-17 NOTE — H&P (Signed)
Laura Bender, Laura Bender             ACCOUNT NO.:  [id number]   MEDICAL RECORD NO.:  [id number]          PATIENT TYPE:  INP   LOCATION:  5712                         FACILITY:  [facility]   PHYSICIAN:  Maisie Fus A. Cornett, M.D.DATE OF BIRTH:  09-02-1956   DATE OF ADMISSION:  08/14/2007  DATE OF DISCHARGE:                              HISTORY & PHYSICAL   CHIEF COMPLAINT:  Right buttock pain and swelling.   HISTORY OF PRESENT ILLNESS:  The patient is a 55 year old female who, 1  month ago, had two perirectal abscesses drained around her anus.  She  has now developed an area of redness and induration on the right main  buttock, well away from this.  This is associated with redness,  bleeding, and pain.  She was seen yesterday in the emergency room; and a  nurse practitioner drained this and packed it; and placed her on  doxycycline.  It has worsened today, and she comes in to see me since  she has a relationship with me from a previous surgery.   PAST MEDICAL HISTORY:  1. ITP.  2. Status post splenectomy.  3. Sinus histiocytosis.   PAST SURGICAL HISTORY:  1. Splenectomy.  2. Hysterectomy.  3. Appendectomy.  4. Incision and drainage of perirectal abscess.  5. Lymph node removal.   ALLERGIES:  None known.   MEDICATIONS:  Premarin, Prozac, and daily vitamins.   SOCIAL HISTORY:  Denies tobacco or alcohol use.   FAMILY HISTORY:  Positive for hypertension, diabetes mellitus. and  hypercholesterolemia.   REVIEW OF SYSTEMS:  A 15-point review of systems is otherwise  unremarkable.   PHYSICAL EXAM:  GENERAL:  A white female in mild distress.  HEENT: Extraocular movements are intact.  Oropharynx clear.  NECK:  Supple, nontender.  No JVD.  CHEST:  Clear to auscultation.  Chest wall motion normal.  CARDIOVASCULAR:  Regular rate and rhythm without murmur, rub, or gallop.  EXTREMITIES:  Warm.  ABDOMEN:  Soft, nontender without rebound, or guarding.  EXTREMITIES:  Muscle tone normal.  Range  of motion normal.  SKIN:  Over the right buttock, midportion, is a 10 x 10 cm area of  induration with a small incision in the middle of it.  It is tender and  fluctuant.  The other incisions in the perianal region were well-healed  with no signs of infection or recurrence.   IMPRESSION:  Right buttock cellulitis, partially drained abscess.   PLAN:  She will need to be admitted for IV antibiotics and subsequent  debridement tomorrow.  She would like me to do it; and I will go ahead  and get her in hospital tonight on IV antibiotics, IV fluids, and pain  medicine; and schedule her for further debridement of this tomorrow.  The risks of bleeding, infection, were all discussed.  Her ITP has been  well controlled since her splenectomy.  She runs platelet a count of  about 8000 which is her baseline.      Thomas A. Cornett, M.D.  Electronically Signed     TAC/MEDQ  D:  08/14/2007  T:  08/15/2007  Job:  [id number]

## 2011-04-24 ENCOUNTER — Other Ambulatory Visit: Payer: Self-pay | Admitting: Hematology & Oncology

## 2011-04-24 ENCOUNTER — Encounter: Payer: BC Managed Care – PPO | Admitting: Hematology & Oncology

## 2011-04-24 LAB — CBC WITH DIFFERENTIAL (CANCER CENTER ONLY)
BASO#: 0.1 10*3/uL (ref 0.0–0.2)
Eosinophils Absolute: 0.3 10*3/uL (ref 0.0–0.5)
HCT: 35.9 % (ref 34.8–46.6)
LYMPH%: 43 % (ref 14.0–48.0)
MCH: 30.6 pg (ref 26.0–34.0)
MCHC: 33.4 g/dL (ref 32.0–36.0)
MONO%: 9.3 % (ref 0.0–13.0)
NEUT#: 2.6 10*3/uL (ref 1.5–6.5)
NEUT%: 41.8 % (ref 39.6–80.0)
RDW: 12.9 % (ref 11.1–15.7)
WBC: 6.3 10*3/uL (ref 3.9–10.0)

## 2011-09-14 LAB — DIFFERENTIAL
Basophils Absolute: 0.1
Basophils Absolute: 0.1
Basophils Relative: 0
Eosinophils Absolute: 0.1
Eosinophils Absolute: 0.1
Eosinophils Relative: 1
Lymphocytes Relative: 15
Lymphs Abs: 1.6
Monocytes Absolute: 1.1 — ABNORMAL HIGH
Neutro Abs: 9.9 — ABNORMAL HIGH
Neutrophils Relative %: 71

## 2011-09-14 LAB — CBC
HCT: 37.1
Hemoglobin: 12.6
MCHC: 33.5
MCV: 88.6
Platelets: 59 — ABNORMAL LOW
RDW: 13.2
RDW: 13.6

## 2011-09-14 LAB — WOUND CULTURE

## 2011-09-14 LAB — BASIC METABOLIC PANEL
CO2: 29
Calcium: 9.2
Creatinine, Ser: 0.71
Glucose, Bld: 100 — ABNORMAL HIGH

## 2011-09-14 LAB — CULTURE, ROUTINE-ABSCESS

## 2011-09-14 LAB — ANAEROBIC CULTURE

## 2011-09-17 LAB — WOUND CULTURE

## 2011-09-17 LAB — BASIC METABOLIC PANEL
BUN: 10
CO2: 28
Calcium: 9.2
Chloride: 103
Creatinine, Ser: 0.65
GFR calc Af Amer: >60

## 2011-09-17 LAB — PROTIME-INR
INR: 1
Prothrombin Time: 12.9

## 2011-09-17 LAB — CBC
MCHC: 34.3
MCV: 88.3
Platelets: 71 — ABNORMAL LOW
RBC: 4.6
RDW: 13.5

## 2011-09-17 LAB — ANAEROBIC CULTURE

## 2011-09-17 LAB — DIFFERENTIAL
Basophils Absolute: 0.1
Basophils Relative: 0
Eosinophils Absolute: 0
Neutro Abs: 17.9 — ABNORMAL HIGH
Neutrophils Relative %: 86 — ABNORMAL HIGH

## 2011-09-17 LAB — APTT: aPTT: 30

## 2011-10-19 ENCOUNTER — Other Ambulatory Visit: Payer: Self-pay | Admitting: Hematology & Oncology

## 2011-10-19 DIAGNOSIS — Z1231 Encounter for screening mammogram for malignant neoplasm of breast: Secondary | ICD-10-CM

## 2011-11-08 ENCOUNTER — Ambulatory Visit
Admission: RE | Admit: 2011-11-08 | Discharge: 2011-11-08 | Disposition: A | Discharge status: Home / Self Care | Payer: BC Managed Care – PPO | Source: Ambulatory Visit | Attending: Hematology & Oncology | Admitting: Hematology & Oncology

## 2011-11-08 DIAGNOSIS — Z1231 Encounter for screening mammogram for malignant neoplasm of breast: Secondary | ICD-10-CM

## 2011-11-12 ENCOUNTER — Other Ambulatory Visit: Payer: Self-pay | Admitting: Hematology & Oncology

## 2011-11-12 DIAGNOSIS — R928 Other abnormal and inconclusive findings on diagnostic imaging of breast: Secondary | ICD-10-CM

## 2011-11-19 ENCOUNTER — Ambulatory Visit
Admission: RE | Admit: 2011-11-19 | Discharge: 2011-11-19 | Disposition: A | Discharge status: Home / Self Care | Payer: BC Managed Care – PPO | Source: Ambulatory Visit | Attending: Hematology & Oncology | Admitting: Hematology & Oncology

## 2011-11-19 ENCOUNTER — Other Ambulatory Visit: Payer: Self-pay | Admitting: Hematology & Oncology

## 2011-11-19 DIAGNOSIS — R928 Other abnormal and inconclusive findings on diagnostic imaging of breast: Secondary | ICD-10-CM

## 2011-11-23 ENCOUNTER — Other Ambulatory Visit: Payer: Self-pay | Admitting: *Deleted

## 2011-11-23 ENCOUNTER — Encounter: Payer: Self-pay | Admitting: Hematology & Oncology

## 2011-11-23 ENCOUNTER — Other Ambulatory Visit: Payer: Self-pay | Admitting: Hematology & Oncology

## 2011-11-23 DIAGNOSIS — D693 Immune thrombocytopenic purpura: Secondary | ICD-10-CM

## 2011-11-28 ENCOUNTER — Other Ambulatory Visit (HOSPITAL_BASED_OUTPATIENT_CLINIC_OR_DEPARTMENT_OTHER): Payer: BC Managed Care – PPO | Admitting: Lab

## 2011-11-28 ENCOUNTER — Telehealth: Payer: Self-pay | Admitting: Hematology & Oncology

## 2011-11-28 DIAGNOSIS — D693 Immune thrombocytopenic purpura: Secondary | ICD-10-CM

## 2011-11-28 LAB — CBC WITH DIFFERENTIAL (CANCER CENTER ONLY)
BASO#: 0.1 10*3/uL (ref 0.0–0.2)
BASO%: 1.1 % (ref 0.0–2.0)
EOS%: 1.3 % (ref 0.0–7.0)
HGB: 13.1 g/dL (ref 11.6–15.9)
MCH: 31 pg (ref 26.0–34.0)
MCHC: 33.8 g/dL (ref 32.0–36.0)
MONO%: 13.1 % — ABNORMAL HIGH (ref 0.0–13.0)
NEUT#: 2.6 10*3/uL (ref 1.5–6.5)
RDW: 13.1 % (ref 11.1–15.7)

## 2011-11-28 NOTE — Telephone Encounter (Signed)
Pt was her on 11/28/11 for lab work, she sch MD visit for 01/02/12

## 2011-11-29 ENCOUNTER — Ambulatory Visit
Admission: RE | Admit: 2011-11-29 | Discharge: 2011-11-29 | Disposition: A | Discharge status: Home / Self Care | Payer: BC Managed Care – PPO | Source: Ambulatory Visit | Attending: Hematology & Oncology | Admitting: Hematology & Oncology

## 2011-11-29 ENCOUNTER — Other Ambulatory Visit: Payer: Self-pay | Admitting: Hematology & Oncology

## 2011-11-29 DIAGNOSIS — R928 Other abnormal and inconclusive findings on diagnostic imaging of breast: Secondary | ICD-10-CM

## 2012-01-02 ENCOUNTER — Ambulatory Visit (HOSPITAL_BASED_OUTPATIENT_CLINIC_OR_DEPARTMENT_OTHER): Payer: BC Managed Care – PPO | Admitting: Hematology & Oncology

## 2012-01-02 DIAGNOSIS — L723 Sebaceous cyst: Secondary | ICD-10-CM

## 2012-01-02 DIAGNOSIS — R222 Localized swelling, mass and lump, trunk: Secondary | ICD-10-CM

## 2012-01-02 DIAGNOSIS — D693 Immune thrombocytopenic purpura: Secondary | ICD-10-CM

## 2012-01-02 DIAGNOSIS — H6690 Otitis media, unspecified, unspecified ear: Secondary | ICD-10-CM

## 2012-01-02 MED ORDER — CEFDINIR 300 MG PO CAPS: 300.0000 mg | ORAL_CAPSULE | Freq: Two times a day (BID) | ORAL | Status: AC

## 2012-01-02 MED ORDER — CLORAZEPATE DIPOTASSIUM 7.5 MG PO TABS: 7.5000 mg | ORAL_TABLET | Freq: Every day | ORAL | Status: DC

## 2012-01-02 MED ORDER — FLUOXETINE HCL 20 MG PO CAPS: 20.0000 mg | ORAL_CAPSULE | Freq: Every day | ORAL | Status: DC

## 2012-01-02 MED ORDER — ESTROGENS CONJUGATED 1.25 MG PO TABS: 1.2500 mg | ORAL_TABLET | Freq: Every day | ORAL | Status: DC

## 2012-01-02 MED ORDER — TRAZODONE HCL 50 MG PO TABS: 50.0000 mg | ORAL_TABLET | Freq: Every day | ORAL | Status: DC

## 2012-01-02 NOTE — Progress Notes (Signed)
Addended by: Arlan Organ R on: 01/02/2012 02:00 PM   Modules accepted: Orders, Medications

## 2012-01-02 NOTE — Progress Notes (Signed)
This office note has been dictated.

## 2012-01-03 NOTE — Progress Notes (Signed)
CC:   Adolph Pollack, M.D.  DIAGNOSES: 1. Refractory ITP. 2. Fibromyalgia.  CURRENT THERAPY:  Observation.  INTERIM HISTORY:  Laura Bender comes in for follow-up.  She is doing okay.  She did have a breast biopsy done recently.  I think this was for a lesion in the left breast.  Thankfully, the pathology report ([id number]) showed benign breast fibrocystic changes.  Otherwise, Laura Bender has no problems.  She said that she does have some stuffiness with her ears.  She does have a problem with sinusitis in the past.  She also has what appears to be a lipoma on her right upper back.  This is becoming a little more painful for her when she lies down.  We may need to have this taken out.  She has had no bleeding.  There has been no change in bowel or bladder habits.  She has had no issues with her fibromyalgia.  PHYSICAL EXAMINATION:  General Appearance:  This is a well-developed, well-nourished white female in no obvious distress.  Vital Signs:  98.1, pulse 68, respiratory rate 14, blood pressure 121/68.  Weight is 180. Head and Neck Exam:  Shows a normocephalic, atraumatic skull.  Her tonsils are not enlarged.  There is no pharyngeal erythema.  Ear Exam: Does show some fullness with the tympanic membranes.  She appears to have a little fluid behind the tympanic membranes.  She does have a good light reflex.  There is no adenopathy in her neck.  Thyroid is not palpable.  Lungs are clear bilaterally.  Cardiac Exam:  Regular rate and rhythm with a normal S1, S2.  There are no murmurs, rubs or bruits. Abdominal Exam:  Soft with good bowel sounds.  There is no fluid wave. She has a well-healed splenectomy scar.  There is no palpable hepatomegaly.  Back Exam:  Does show this lipoma-type lesion in the right upper back.  It is nontender to palpation.  It is somewhat mobile. It is slightly firm.  It probably measures about 4 x 3 cm.  Extremities: Show no clubbing, cyanosis or  edema.  She has good range of motion of her joints.  There is no erythema, warmth or swelling of her joints. Skin Exam:  No rashes, ecchymosis or petechia.  LABORATORY STUDIES:  White cell count is 6.4, hemoglobin 13.1, hematocrit 38.8, platelet count 48,000 (11/28/2011).  IMPRESSION:  Laura Bender is a 56 year old white female with refractory ITP.  She has had her spleen out.  We last treated her with Rituxan. This was several years ago.  Her platelet count has been on the low side, but yet she is totally asymptomatic.  We will go ahead and get her set up with a CT scan of the chest.  This will look at this lipoma/cyst in the upper back.  If needed, Dr. Abbey Chatters, who has seen Laura Bender in the past, can see her again for an evaluation for resection of this area.  We did go ahead and give Laura Bender a prescription for Omnicef (600 mg p.o. daily times 7 days) for what appears to be otitis media.  Laura Bender usually likes to come back when she has issues.  We will leave that for her to let us know when she needs to see [place].  We did refill her medications today.    ______________________________ Josph Macho, M.D. PRE/MEDQ  D:  01/02/2012  T:  01/03/2012  Job:  [id number]

## 2012-01-09 ENCOUNTER — Other Ambulatory Visit (HOSPITAL_BASED_OUTPATIENT_CLINIC_OR_DEPARTMENT_OTHER): Payer: BC Managed Care – PPO

## 2012-01-09 ENCOUNTER — Ambulatory Visit (HOSPITAL_BASED_OUTPATIENT_CLINIC_OR_DEPARTMENT_OTHER)
Admission: RE | Admit: 2012-01-09 | Discharge: 2012-01-09 | Disposition: A | Discharge status: Home / Self Care | Payer: BC Managed Care – PPO | Source: Ambulatory Visit | Attending: Hematology & Oncology | Admitting: Hematology & Oncology

## 2012-01-09 DIAGNOSIS — H6690 Otitis media, unspecified, unspecified ear: Secondary | ICD-10-CM

## 2012-01-09 DIAGNOSIS — R935 Abnormal findings on diagnostic imaging of other abdominal regions, including retroperitoneum: Secondary | ICD-10-CM

## 2012-01-09 DIAGNOSIS — R222 Localized swelling, mass and lump, trunk: Secondary | ICD-10-CM

## 2012-01-09 DIAGNOSIS — J984 Other disorders of lung: Secondary | ICD-10-CM | POA: Insufficient documentation

## 2012-01-09 DIAGNOSIS — R229 Localized swelling, mass and lump, unspecified: Secondary | ICD-10-CM

## 2012-01-09 DIAGNOSIS — D693 Immune thrombocytopenic purpura: Secondary | ICD-10-CM

## 2012-01-09 DIAGNOSIS — R911 Solitary pulmonary nodule: Secondary | ICD-10-CM

## 2012-01-10 ENCOUNTER — Telehealth: Payer: Self-pay | Admitting: Hematology & Oncology

## 2012-01-10 NOTE — Telephone Encounter (Signed)
Nothing to document

## 2012-02-05 ENCOUNTER — Other Ambulatory Visit: Payer: Self-pay | Admitting: Hematology & Oncology

## 2012-02-07 ENCOUNTER — Encounter (INDEPENDENT_AMBULATORY_CARE_PROVIDER_SITE_OTHER): Payer: Self-pay | Admitting: General Surgery

## 2012-02-07 ENCOUNTER — Ambulatory Visit (INDEPENDENT_AMBULATORY_CARE_PROVIDER_SITE_OTHER): Payer: BC Managed Care – PPO | Admitting: General Surgery

## 2012-02-07 ENCOUNTER — Other Ambulatory Visit (INDEPENDENT_AMBULATORY_CARE_PROVIDER_SITE_OTHER): Payer: Self-pay | Admitting: General Surgery

## 2012-02-07 VITALS — BP 118/66 | HR 64 | Temp 97.2°F | Resp 12 | Ht 68.0 in | Wt 177.4 lb

## 2012-02-07 DIAGNOSIS — D1779 Benign lipomatous neoplasm of other sites: Secondary | ICD-10-CM

## 2012-02-07 DIAGNOSIS — D171 Benign lipomatous neoplasm of skin and subcutaneous tissue of trunk: Secondary | ICD-10-CM

## 2012-02-07 NOTE — Progress Notes (Signed)
[name], female   DOB: Sep 27, 1956, 56 y.o.   MRN: [id number]  Chief Complaint  Patient presents with  . Lipoma    new prob- eval lipoma on back    HPI Laura Bender is a 56 y.o. female.   HPIShe is referred by Dr. Myna Hidalgo because of an enlarging and painful lipoma of the back. She's had this for many years but recently has become larger and more painful. A CT scan demonstrated findings consistent with a 5.5 cm lipomatous mass. She is here to discuss removal of that. She does have thrombocytopenia and her last platelet count was 48,000.  Past Medical History  Diagnosis Date  . ITP (idiopathic thrombocytopenic purpura) 11/23/2011  . Clotting disorder   . Lipoma     Past Surgical History  Procedure Date  . Splenectomy, total 1995  . Abdominal hysterectomy 1995  . Appendectomy 1995  . Irrigation and debridement abscess     x2  . Parotid gland tumor excision     benign    Family History  Problem Relation Age of Onset  . Heart disease Maternal Grandmother   . Heart disease Maternal Grandfather   . Heart disease Paternal Grandmother   . Heart disease Paternal Grandfather     Social History History  Substance Use Topics  . Smoking status: Former Games developer  . Smokeless tobacco: Not on file  . Alcohol Use: Yes    No Known Allergies  Current Outpatient Prescriptions  Medication Sig Dispense Refill  . clorazepate (TRANXENE) 7.5 MG tablet Take 1 tablet (7.5 mg total) by mouth daily at 8 pm.  30 tablet  3  . estrogens, conjugated, (PREMARIN) 1.25 MG tablet Take 1 tablet (1.25 mg total) by mouth daily. Take daily for 21 days then do not take for 7 days.  30 tablet  3  . FLUoxetine (PROZAC) 20 MG capsule Take 1 capsule (20 mg total) by mouth daily.  30 capsule  3  . traZODone (DESYREL) 50 MG tablet TAKE 1 TABLET BY MOUTH EVERY DAY  30 tablet  PRN    Review of Systems Review of Systems  Constitutional: Negative for fever and chills.  HENT: Negative.     Respiratory: Negative.   Cardiovascular: Negative.   Gastrointestinal: Negative.   Hematological: Does not bruise/bleed easily.       Low platelet count.    Blood pressure 118/66, pulse 64, temperature 97.2 F (36.2 C), temperature source Temporal, resp. rate 12, height 5\' 8"  (1.727 m), weight 177 lb 6.4 oz (80.468 kg).  Physical Exam Physical Exam  Constitutional: She appears well-developed and well-nourished. No distress.  HENT:  Head: Normocephalic and atraumatic.  Cardiovascular: Normal rate and regular rhythm.   Pulmonary/Chest: Effort normal and breath sounds normal.  Musculoskeletal:       6 cm soft tissue mass in the right scapular region of the back that is mobile. There is no erythema.    Data Reviewed Dr. Gustavo Lah note.  CT scan.  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 She also has thrombocytopenia with a platelet count less than 50,000.    Plan    Removal of lipoma of the back under general anesthesia. Perioperative platelet transfusion. I have discussed the procedure and risks with her the risks include but not limited to bleeding, infection, wound healing problems, anesthesia. She realizes that her biggest risk is bleeding. We also discussed after care. She seems to understand all this and agrees  with the plan.       Parnell Spieler J 02/07/2012, 9:46 AM

## 2012-02-07 NOTE — Patient Instructions (Signed)
Call us if you get sick the week before your operation.

## 2012-02-27 ENCOUNTER — Encounter (HOSPITAL_COMMUNITY): Payer: Self-pay | Admitting: Pharmacy Technician

## 2012-03-04 ENCOUNTER — Encounter (HOSPITAL_COMMUNITY)
Admission: RE | Admit: 2012-03-04 | Discharge: 2012-03-04 | Disposition: A | Discharge status: Home / Self Care | Payer: BC Managed Care – PPO | Source: Ambulatory Visit | Attending: General Surgery | Admitting: General Surgery

## 2012-03-04 ENCOUNTER — Encounter (HOSPITAL_COMMUNITY): Payer: Self-pay

## 2012-03-04 DIAGNOSIS — F32A Depression, unspecified: Secondary | ICD-10-CM

## 2012-03-04 DIAGNOSIS — M797 Fibromyalgia: Secondary | ICD-10-CM

## 2012-03-04 DIAGNOSIS — D693 Immune thrombocytopenic purpura: Secondary | ICD-10-CM

## 2012-03-04 HISTORY — DX: Depression, unspecified: F32.A

## 2012-03-04 HISTORY — DX: Immune thrombocytopenic purpura: D69.3

## 2012-03-04 HISTORY — DX: Fibromyalgia: M79.7

## 2012-03-04 HISTORY — DX: Major depressive disorder, single episode, unspecified: F32.9

## 2012-03-04 LAB — COMPREHENSIVE METABOLIC PANEL
BUN: 12 mg/dL (ref 6–23)
Calcium: 9.5 mg/dL (ref 8.4–10.5)
Creatinine, Ser: 0.67 mg/dL (ref 0.50–1.10)
GFR calc Af Amer: >90 mL/min (ref >90)
Glucose, Bld: 78 mg/dL (ref 70–99)
Total Protein: 7.8 g/dL (ref 6.0–8.3)

## 2012-03-04 LAB — DIFFERENTIAL
Eosinophils Relative: 2 % (ref 0–5)
Lymphocytes Relative: 48 % — ABNORMAL HIGH (ref 12–46)
Monocytes Absolute: 0.7 10*3/uL (ref 0.1–1.0)
Monocytes Relative: 11 % (ref 3–12)
Neutrophils Relative %: 38 % — ABNORMAL LOW (ref 43–77)

## 2012-03-04 LAB — CBC
HCT: 38.1 % (ref 36.0–46.0)
Hemoglobin: 12.6 g/dL (ref 12.0–15.0)
MCH: 30.7 pg (ref 26.0–34.0)
MCHC: 33.1 g/dL (ref 30.0–36.0)
RDW: 13.3 % (ref 11.5–15.5)

## 2012-03-04 LAB — PROTIME-INR
INR: 0.9 (ref 0.00–1.49)
Prothrombin Time: 12.3 seconds (ref 11.6–15.2)

## 2012-03-04 NOTE — Patient Instructions (Signed)
20 Laura Bender  03/04/2012   Your procedure is scheduled on: 4-8  -2013  Report to [facility] at   0530     AM.  Call this number if you have problems the morning of surgery: [phone]   Remember:   Do not eat food:After Midnight.    Take these medicines the morning of surgery with A SIP OF WATER: Prozac, Tylenol.   Do not wear jewelry, make-up or nail polish.  Do not wear lotions, powders, or perfumes. You may wear deodorant.  Do not shave 48 hours prior to surgery.(face and neck okay, no shaving of legs)  Do not bring valuables to the hospital.  Contacts, dentures or bridgework may not be worn into surgery.  Leave suitcase in the car. After surgery it may be brought to your room.  For patients admitted to the hospital, checkout time is 11:00 AM the day of discharge.   Patients discharged the day of surgery will not be allowed to drive home.  Name and phone number of your driver: Laura Bender, friend- [phone]  Special Instructions: CHG Shower Use Special Wash: 1/2 bottle night before surgery and 1/2 bottle morning of surgery.(avoid face and genitals)   Please read over the following fact sheets that you were given: MRSA Information, Blood Transfusion fact sheet, Incentive Spirometry Instruction.

## 2012-03-05 ENCOUNTER — Telehealth (INDEPENDENT_AMBULATORY_CARE_PROVIDER_SITE_OTHER): Payer: Self-pay

## 2012-03-05 NOTE — Telephone Encounter (Signed)
Nurse from [facility] pre op called with FYI that pt's platelets were at 55 today. She has ITP.  Her surgery is scheduled for 03/10/12 at 7:30am.  Dr. Abbey Chatters was notified.

## 2012-03-05 NOTE — Pre-Procedure Instructions (Signed)
03-05-12 22 Dr. Maris Berger RN- [facility] [facility], made aware pt's platelet 55,000 and has a hx. ITP-T/S has been done on 03-04-12.W. Zali Kamaka,RN

## 2012-03-09 ENCOUNTER — Other Ambulatory Visit: Payer: Self-pay | Admitting: Hematology & Oncology

## 2012-03-09 MED ORDER — CEFAZOLIN SODIUM-DEXTROSE 2-3 GM-% IV SOLR: 2.0000 g | INTRAVENOUS | Status: DC

## 2012-03-10 ENCOUNTER — Encounter (HOSPITAL_COMMUNITY)
Admission: RE | Disposition: A | Discharge status: Home / Self Care | Payer: Self-pay | Source: Ambulatory Visit | Attending: General Surgery

## 2012-03-10 ENCOUNTER — Ambulatory Visit (HOSPITAL_COMMUNITY)
Admission: RE | Admit: 2012-03-10 | Discharge: 2012-03-10 | Disposition: A | Discharge status: Home / Self Care | Payer: BC Managed Care – PPO | Source: Ambulatory Visit | Attending: General Surgery | Admitting: General Surgery

## 2012-03-10 ENCOUNTER — Ambulatory Visit (HOSPITAL_COMMUNITY): Payer: BC Managed Care – PPO | Admitting: Anesthesiology

## 2012-03-10 ENCOUNTER — Encounter (HOSPITAL_COMMUNITY): Payer: Self-pay | Admitting: Anesthesiology

## 2012-03-10 ENCOUNTER — Encounter (HOSPITAL_COMMUNITY): Payer: Self-pay | Admitting: *Deleted

## 2012-03-10 DIAGNOSIS — IMO0001 Reserved for inherently not codable concepts without codable children: Secondary | ICD-10-CM | POA: Insufficient documentation

## 2012-03-10 DIAGNOSIS — D696 Thrombocytopenia, unspecified: Secondary | ICD-10-CM | POA: Insufficient documentation

## 2012-03-10 DIAGNOSIS — D1779 Benign lipomatous neoplasm of other sites: Secondary | ICD-10-CM | POA: Insufficient documentation

## 2012-03-10 DIAGNOSIS — Z01812 Encounter for preprocedural laboratory examination: Secondary | ICD-10-CM | POA: Insufficient documentation

## 2012-03-10 DIAGNOSIS — D1739 Benign lipomatous neoplasm of skin and subcutaneous tissue of other sites: Secondary | ICD-10-CM

## 2012-03-10 HISTORY — PX: MASS EXCISION: SHX2000

## 2012-03-10 LAB — TYPE AND SCREEN
ABO/RH(D): O POS
Antibody Screen: NEGATIVE

## 2012-03-10 SURGERY — EXCISION MASS
Anesthesia: General | Site: Back | Wound class: Clean

## 2012-03-10 MED ORDER — SODIUM CHLORIDE 0.9 % IV SOLN
INTRAVENOUS | Status: DC
  Administered 2012-03-10 (×2): via INTRAVENOUS

## 2012-03-10 MED ORDER — HYDROMORPHONE HCL PF 1 MG/ML IJ SOLN
INTRAMUSCULAR | Status: AC
  Filled 2012-03-10: qty 1

## 2012-03-10 MED ORDER — ACETAMINOPHEN 650 MG RE SUPP
650.0000 mg | RECTAL | Status: DC | PRN
  Filled 2012-03-10: qty 1

## 2012-03-10 MED ORDER — CEFAZOLIN SODIUM 1-5 GM-% IV SOLN
INTRAVENOUS | Status: DC | PRN
  Administered 2012-03-10: 2 g via INTRAVENOUS

## 2012-03-10 MED ORDER — METOCLOPRAMIDE HCL 5 MG/ML IJ SOLN
INTRAMUSCULAR | Status: DC | PRN
  Administered 2012-03-10: 10 mg via INTRAVENOUS

## 2012-03-10 MED ORDER — MORPHINE SULFATE 10 MG/ML IJ SOLN: 2.0000 mg | INTRAMUSCULAR | Status: DC | PRN

## 2012-03-10 MED ORDER — SCOPOLAMINE 1 MG/3DAYS TD PT72
MEDICATED_PATCH | TRANSDERMAL | Status: AC
  Filled 2012-03-10: qty 1

## 2012-03-10 MED ORDER — OXYCODONE HCL 5 MG PO TABS: 5.0000 mg | ORAL_TABLET | ORAL | Status: DC | PRN

## 2012-03-10 MED ORDER — BUPIVACAINE-EPINEPHRINE PF 0.25-1:200000 % IJ SOLN
INTRAMUSCULAR | Status: AC
  Filled 2012-03-10: qty 30

## 2012-03-10 MED ORDER — LIDOCAINE HCL (CARDIAC) 20 MG/ML IV SOLN
INTRAVENOUS | Status: DC | PRN
  Administered 2012-03-10: 50 mg via INTRAVENOUS

## 2012-03-10 MED ORDER — CEFAZOLIN SODIUM-DEXTROSE 2-3 GM-% IV SOLR
INTRAVENOUS | Status: AC
  Filled 2012-03-10: qty 50

## 2012-03-10 MED ORDER — ONDANSETRON HCL 4 MG/2ML IJ SOLN: 4.0000 mg | Freq: Four times a day (QID) | INTRAMUSCULAR | Status: DC | PRN

## 2012-03-10 MED ORDER — ACETAMINOPHEN 325 MG PO TABS: 650.0000 mg | ORAL_TABLET | ORAL | Status: DC | PRN

## 2012-03-10 MED ORDER — HYDROMORPHONE HCL PF 1 MG/ML IJ SOLN
0.2500 mg | INTRAMUSCULAR | Status: DC | PRN
  Administered 2012-03-10 (×2): 0.5 mg via INTRAVENOUS

## 2012-03-10 MED ORDER — SODIUM CHLORIDE 0.9 % IJ SOLN: 3.0000 mL | INTRAMUSCULAR | Status: DC | PRN

## 2012-03-10 MED ORDER — GLYCOPYRROLATE 0.2 MG/ML IJ SOLN
INTRAMUSCULAR | Status: DC | PRN
  Administered 2012-03-10: 0.6 mg via INTRAVENOUS

## 2012-03-10 MED ORDER — PROPOFOL 10 MG/ML IV BOLUS
INTRAVENOUS | Status: DC | PRN
  Administered 2012-03-10: 200 mg via INTRAVENOUS

## 2012-03-10 MED ORDER — FENTANYL CITRATE 0.05 MG/ML IJ SOLN
INTRAMUSCULAR | Status: DC | PRN
  Administered 2012-03-10 (×2): 50 ug via INTRAVENOUS
  Administered 2012-03-10: 100 ug via INTRAVENOUS

## 2012-03-10 MED ORDER — LACTATED RINGERS IV SOLN
INTRAVENOUS | Status: DC | PRN
  Administered 2012-03-10: 07:00:00 via INTRAVENOUS

## 2012-03-10 MED ORDER — HYDROCODONE-ACETAMINOPHEN 5-325 MG PO TABS: 1.0000 | ORAL_TABLET | ORAL | Status: AC | PRN

## 2012-03-10 MED ORDER — ONDANSETRON HCL 4 MG/2ML IJ SOLN
INTRAMUSCULAR | Status: DC | PRN
  Administered 2012-03-10: 4 mg via INTRAVENOUS

## 2012-03-10 MED ORDER — ROCURONIUM BROMIDE 100 MG/10ML IV SOLN
INTRAVENOUS | Status: DC | PRN
  Administered 2012-03-10: 40 mg via INTRAVENOUS

## 2012-03-10 MED ORDER — BUPIVACAINE-EPINEPHRINE 0.25% -1:200000 IJ SOLN
INTRAMUSCULAR | Status: DC | PRN
  Administered 2012-03-10: 8 mL

## 2012-03-10 MED ORDER — NEOSTIGMINE METHYLSULFATE 1 MG/ML IJ SOLN
INTRAMUSCULAR | Status: DC | PRN
  Administered 2012-03-10: 4 mg via INTRAVENOUS

## 2012-03-10 MED ORDER — 0.9 % SODIUM CHLORIDE (POUR BTL) OPTIME
TOPICAL | Status: DC | PRN
  Administered 2012-03-10: 1000 mL

## 2012-03-10 MED ORDER — SCOPOLAMINE 1 MG/3DAYS TD PT72
MEDICATED_PATCH | TRANSDERMAL | Status: DC | PRN
  Administered 2012-03-10: 1 via TRANSDERMAL

## 2012-03-10 MED ORDER — DEXAMETHASONE SODIUM PHOSPHATE 10 MG/ML IJ SOLN
INTRAMUSCULAR | Status: DC | PRN
  Administered 2012-03-10: 4 mg via INTRAVENOUS

## 2012-03-10 SURGICAL SUPPLY — 42 items
APL SKNCLS STERI-STRIP NONHPOA (GAUZE/BANDAGES/DRESSINGS) ×1
BENZOIN TINCTURE PRP APPL 2/3 (GAUZE/BANDAGES/DRESSINGS) ×1 IMPLANT
BLADE HEX COATED 2.75 (ELECTRODE) ×2 IMPLANT
BLADE SURG SZ10 CARB STEEL (BLADE) ×3 IMPLANT
CANISTER SUCTION 2500CC (MISCELLANEOUS) ×2 IMPLANT
CLOTH BEACON ORANGE TIMEOUT ST (SAFETY) ×2 IMPLANT
CLSR STERI-STRIP ANTIMIC 1/2X4 (GAUZE/BANDAGES/DRESSINGS) ×2 IMPLANT
DECANTER SPIKE VIAL GLASS SM (MISCELLANEOUS) IMPLANT
DRAPE LAPAROTOMY T 102X78X121 (DRAPES) IMPLANT
DRAPE LAPAROTOMY TRNSV 102X78 (DRAPE) ×2 IMPLANT
DRAPE LG THREE QUARTER DISP (DRAPES) ×1 IMPLANT
DRSG TEGADERM 4X4.75 (GAUZE/BANDAGES/DRESSINGS) ×1 IMPLANT
ELECT REM PT RETURN 9FT ADLT (ELECTROSURGICAL) ×2
ELECTRODE REM PT RTRN 9FT ADLT (ELECTROSURGICAL) ×1 IMPLANT
EVACUATOR SILICONE 100CC (DRAIN) IMPLANT
GAUZE XEROFORM 4X4 STRL (GAUZE/BANDAGES/DRESSINGS) ×1 IMPLANT
GLOVE BIOGEL PI IND STRL 7.0 (GLOVE) ×1 IMPLANT
GLOVE BIOGEL PI INDICATOR 7.0 (GLOVE) ×1
GLOVE ECLIPSE 8.0 STRL XLNG CF (GLOVE) ×2 IMPLANT
GLOVE INDICATOR 8.0 STRL GRN (GLOVE) ×4 IMPLANT
GOWN STRL NON-REIN LRG LVL3 (GOWN DISPOSABLE) ×2 IMPLANT
GOWN STRL REIN XL XLG (GOWN DISPOSABLE) ×4 IMPLANT
KIT BASIN OR (CUSTOM PROCEDURE TRAY) ×2 IMPLANT
NDL HYPO 25X1 1.5 SAFETY (NEEDLE) ×1 IMPLANT
NEEDLE HYPO 25X1 1.5 SAFETY (NEEDLE) ×2 IMPLANT
NS IRRIG 1000ML POUR BTL (IV SOLUTION) ×2 IMPLANT
PACK BASIC VI WITH GOWN DISP (CUSTOM PROCEDURE TRAY) ×2 IMPLANT
PEN SKIN MARKING BROAD (MISCELLANEOUS) IMPLANT
PENCIL BUTTON HOLSTER BLD 10FT (ELECTRODE) ×2 IMPLANT
SOL PREP POV-IOD 16OZ 10% (MISCELLANEOUS) ×2 IMPLANT
SPONGE GAUZE 4X4 12PLY (GAUZE/BANDAGES/DRESSINGS) ×2 IMPLANT
SPONGE LAP 18X18 X RAY DECT (DISPOSABLE) IMPLANT
SPONGE LAP 4X18 X RAY DECT (DISPOSABLE) ×2 IMPLANT
STAPLER VISISTAT 35W (STAPLE) IMPLANT
SUT MNCRL AB 4-0 PS2 18 (SUTURE) ×1 IMPLANT
SUT VIC AB 3-0 SH 27 (SUTURE) ×2
SUT VIC AB 3-0 SH 27XBRD (SUTURE) IMPLANT
SUT VICRYL 0 UR6 27IN ABS (SUTURE) ×2 IMPLANT
SYR CONTROL 10ML LL (SYRINGE) ×2 IMPLANT
TAPE CLOTH SURG 4X10 WHT LF (GAUZE/BANDAGES/DRESSINGS) ×2 IMPLANT
TOWEL OR 17X26 10 PK STRL BLUE (TOWEL DISPOSABLE) ×2 IMPLANT
YANKAUER SUCT BULB TIP 10FT TU (MISCELLANEOUS) IMPLANT

## 2012-03-10 NOTE — H&P (Signed)
Laura Bender is an 56 y.o. female.   Chief Complaint: Enlarging soft tissue mass on back HPI:   She has an enlarging mass in the subcutaneous tissue of the right scapular area.  She now presents for removal.  She has thrombocytopenia as well.  Past Medical History  Diagnosis Date  . Clotting disorder   . Lipoma 03-04-12    Rt. back about scapular ares  . 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  '95- blow normal levels, but stable.  . Fibromyalgia 03-04-12    Sporadic pain  . Depression 03-04-12    tx. meds    Past Surgical History  Procedure Date  . Splenectomy, total 1995  . Abdominal hysterectomy 1995  . Appendectomy 1995  . Irrigation and debridement abscess 03-04-12    x2- buttocks  . Parotid gland tumor excision     benign  . Breast surgery 03-04-12    12'12 -left needle biopsy(benign)-Titanium needle marker remains    Family History  Problem Relation Age of Onset  . Heart disease Maternal Grandmother   . Heart disease Maternal Grandfather   . Heart disease Paternal Grandmother   . Heart disease Paternal Grandfather    Social History:  reports that she has quit smoking. She does not have any smokeless tobacco history on file. She reports that she drinks alcohol. She reports that she does not use illicit drugs.  Allergies:  Allergies  Allergen Reactions  . Codeine Nausea And Vomiting    Medications Prior to Admission  Medication Dose Route Frequency Provider Last Rate Last Dose  . 0.9 %  sodium chloride infusion   Intravenous Continuous Adolph Pollack, MD      . ceFAZolin (ANCEF) IVPB 2 g/50 mL premix  2 g Intravenous 60 min Pre-Op Adolph Pollack, MD       Medications Prior to Admission  Medication Sig Dispense Refill  . clorazepate (TRANXENE) 7.5 MG tablet Take 1 tablet (7.5 mg total) by mouth daily at 8 pm.  30 tablet  3  . FLUoxetine (PROZAC) 20 MG capsule Take 20 mg by mouth daily with breakfast.        Results for orders placed during  the hospital encounter of 03/10/12 (from the past 48 hour(s))  PREPARE PLATELET PHERESIS     Status: Normal (Preliminary result)   Collection Time   03/10/12  7:00 AM      Component Value Range Comment   Unit Number [id number]      Blood Component Type PLTPHER LR1      Unit division 00      Status of Unit ISSUED      Transfusion Status OK TO TRANSFUSE      No results found.  Review of Systems  Constitutional: Negative for fever and chills.  HENT: Negative for congestion.   Respiratory: Negative for cough.   Gastrointestinal: Negative for nausea, vomiting and diarrhea.    Blood pressure 123/60, pulse 69, temperature 97.9 F (36.6 C), resp. rate 20, SpO2 100.00%. Physical Exam  Constitutional: She appears well-developed and well-nourished.  HENT:  Head: Normocephalic and atraumatic.  Neck: Neck supple.  Cardiovascular: Normal rate and regular rhythm.   Respiratory: Effort normal and breath sounds normal.  GI: Soft. There is no tenderness.  Musculoskeletal: She exhibits no edema.       6 cm soft tissue in right scapular region.  Lymphadenopathy:    She has no cervical adenopathy.     Assessment/Plan Enlarging soft  tissue mass on back.  Also has thrombocytopenia.  Plan:  Preop platelet transfusion and then removal of back mass.  Laura Bender J 03/10/2012, 7:40 AM

## 2012-03-10 NOTE — Interval H&P Note (Signed)
History and Physical Interval Note:  03/10/2012 7:44 AM  Laura Bender  has presented today for surgery, with the diagnosis of Enlarging, painful back lipoma  The various methods of treatment have been discussed with the patient and family. After consideration of risks, benefits and other options for treatment, the patient has consented to  Procedure(s) (LRB): EXCISION MASS (N/A) as a surgical intervention .  The patients' history has been reviewed, patient examined, no change in status, stable for surgery.  I have reviewed the patients' chart and labs.  Questions were answered to the patient's satisfaction.     [name] [name]

## 2012-03-10 NOTE — Anesthesia Preprocedure Evaluation (Signed)
Anesthesia Evaluation  Patient identified by MRN, date of birth, ID band Patient awake    Reviewed: Allergy & Precautions, H&P , NPO status , Patient's Chart, lab work & pertinent test results, reviewed documented beta blocker date and time   History of Anesthesia Complications (+) PONV  Airway Mallampati: II TM Distance: >3 FB Neck ROM: Full    Dental  (+) Teeth Intact and Dental Advisory Given   Pulmonary neg pulmonary ROS,  breath sounds clear to auscultation        Cardiovascular negative cardio ROS  Rhythm:Regular Rate:Normal  Denies cardiac symptoms   Neuro/Psych negative neurological ROS  negative psych ROS   GI/Hepatic negative GI ROS, Neg liver ROS,   Endo/Other  negative endocrine ROS  Renal/GU negative Renal ROS  negative genitourinary   Musculoskeletal  (+) Fibromyalgia -  Abdominal   Peds negative pediatric ROS (+)  Hematology ITP Plts 55K No steroids in yrs   Anesthesia Other Findings   Reproductive/Obstetrics negative OB ROS                           Anesthesia Physical Anesthesia Plan  ASA: III  Anesthesia Plan: General   Post-op Pain Management:    Induction: Intravenous  Airway Management Planned: Oral ETT  Additional Equipment:   Intra-op Plan:   Post-operative Plan: Extubation in OR  Informed Consent: I have reviewed the patients History and Physical, chart, labs and discussed the procedure including the risks, benefits and alternatives for the proposed anesthesia with the patient or authorized representative who has indicated his/her understanding and acceptance.   Dental advisory given  Plan Discussed with: CRNA and Surgeon  Anesthesia Plan Comments:         Anesthesia Quick Evaluation

## 2012-03-10 NOTE — Op Note (Signed)
Operative Note  Laura Bender female 56 y.o. 03/10/2012  PREOPERATIVE DX:  Soft tissue mass right upper back POSTOPERATIVE DX:  Same  PROCEDURE:  Excision of 6.2 cm deep soft tissue mass right upper back        Surgeon: Adolph Pollack   Assistants:   Arsenio Loader, PA-S  Anesthesia: General endotracheal anesthesia  Indications: This is a 56 year old female with an enlarging soft tissue mass of the right upper back. She presents for removal. She has thrombocytopenia and was given a preoperative platelet transfusion in the holding room.    Procedure Detail:  She was brought to the operating room supine on a stretcher and given a general anesthetic. She was then placed prone on the operating table and pressure points were padded appropriately. The right upper back area were sterilely prepped and draped.  An incision was made over the area of the subcutaneous  soft tissue mass incising the skin sharply. Using electrocautery the dermis and subcutaneous tissue were divided. The soft tissue mass was deep below the subcutaneous tissue and was identified. A fibrous capsule was then entered and a lipomatous type mass was noted. Using blunt dissection and electrocautery this was freed from its surrounding attachments and removed.  The mass measured 6.2 cm and was sent to pathology.  The wound was inspected and bleeding points were controlled electrocautery. Marcaine solution was infiltrated into the wound for local anesthetic effect.The was inspected again and hemostasis was adequate.  The subcutaneous tissue was then closed in 2 layers with running 3-0 Vicryl suture. The skin was closed with a 4-0 Monocryl subcuticular stitch. Steri-Strips and sterile dressings were applied.  She tolerated the procedure well without any apparent complications and was taken to the recovery room in satisfactory condition.   Estimated Blood Loss:  Minimal         Drains: none          Blood Given:  one unit PLTS          Specimens: Soft tissue mass right upper back        Complications:  * No complications entered in OR log *         Disposition: PACU - hemodynamically stable.         Condition: stable

## 2012-03-10 NOTE — Discharge Instructions (Addendum)
Light activities with her right arm for one week. Please do not lift anything heavier than 10 pounds with the right arm for one week.  May return to work in 2-5 days or when you're comfortable. May drive tomorrow afternoon if you are not taking pain medicine. Do not drive if you are taking pain medicine.  Liquid diet and crackers for the rest of the day. Diet as tolerated beginning tomorrow.  May shower tomorrow. Remove bandage in 3 days. Leave Steri-Strips on.  Ice to the area for 48 hours.  Call for severe swelling, heavy bleeding, High fever (greater than 101.5), or other wound problems.  Follow up appointment in 2-3 weeks.  Please call the office to make this appointment, [phone].

## 2012-03-10 NOTE — Anesthesia Postprocedure Evaluation (Signed)
  Anesthesia Post-op Note  Patient: Laura Bender  Procedure(s) Performed: Procedure(s) (LRB): EXCISION MASS (N/A)  Patient Location: PACU  Anesthesia Type: General  Level of Consciousness: oriented and sedated  Airway and Oxygen Therapy: Patient Spontanous Breathing  Post-op Pain: mild  Post-op Assessment: Post-op Vital signs reviewed, Patient's Cardiovascular Status Stable, Respiratory Function Stable and Patent Airway  Post-op Vital Signs: stable  Complications: No apparent anesthesia complications

## 2012-03-10 NOTE — Transfer of Care (Signed)
Immediate Anesthesia Transfer of Care Note  Patient: Laura Bender  Procedure(s) Performed: Procedure(s) (LRB): EXCISION MASS (N/A)  Patient Location: PACU  Anesthesia Type: General  Level of Consciousness: awake, alert  and oriented  Airway & Oxygen Therapy: Patient Spontanous Breathing and Patient connected to face mask oxygen  Post-op Assessment: Report given to PACU RN, Post -op Vital signs reviewed and stable and Patient moving all extremities  Post vital signs: Reviewed and stable  Complications: No apparent anesthesia complications

## 2012-03-11 LAB — PREPARE PLATELET PHERESIS: Unit division: 0

## 2012-03-18 ENCOUNTER — Encounter (INDEPENDENT_AMBULATORY_CARE_PROVIDER_SITE_OTHER): Payer: BC Managed Care – PPO | Admitting: General Surgery

## 2012-03-25 ENCOUNTER — Encounter (HOSPITAL_COMMUNITY): Payer: Self-pay | Admitting: General Surgery

## 2012-03-26 ENCOUNTER — Encounter (INDEPENDENT_AMBULATORY_CARE_PROVIDER_SITE_OTHER): Payer: Self-pay | Admitting: General Surgery

## 2012-03-26 ENCOUNTER — Ambulatory Visit (INDEPENDENT_AMBULATORY_CARE_PROVIDER_SITE_OTHER): Payer: BC Managed Care – PPO | Admitting: General Surgery

## 2012-03-26 VITALS — BP 104/68 | HR 60 | Temp 97.8°F | Resp 12 | Ht 68.0 in | Wt 178.8 lb

## 2012-03-26 DIAGNOSIS — Z9889 Other specified postprocedural states: Secondary | ICD-10-CM

## 2012-03-26 NOTE — Progress Notes (Signed)
Operation: Removal of right upper back soft tissue mass  Date: March 10, 2012  Pathology: Benign lipoma  HPI: She is here for her first postoperative visit. She is doing well and has no complaints.   Physical Exam: Right upper back wound is clean and intact with no evidence of infection   Assessment: Wound healing well and pathology is benign. I told her there is a small chance that this could recur  Plan: Return visit as needed.

## 2012-03-26 NOTE — Patient Instructions (Signed)
Call if you have any wound problems. 

## 2012-05-30 ENCOUNTER — Other Ambulatory Visit: Payer: Self-pay | Admitting: *Deleted

## 2012-05-30 DIAGNOSIS — R222 Localized swelling, mass and lump, trunk: Secondary | ICD-10-CM

## 2012-05-30 DIAGNOSIS — H6690 Otitis media, unspecified, unspecified ear: Secondary | ICD-10-CM

## 2012-05-30 DIAGNOSIS — D693 Immune thrombocytopenic purpura: Secondary | ICD-10-CM

## 2012-05-30 MED ORDER — CLORAZEPATE DIPOTASSIUM 7.5 MG PO TABS: 7.5000 mg | ORAL_TABLET | Freq: Every day | ORAL | Status: DC

## 2012-05-30 NOTE — Telephone Encounter (Signed)
Pt called stating that [facility] sent a request for Tranxene over 2 weeks ago & hasn't heard back from the office. No refill request received. Reviewed with Dr Myna Hidalgo, ok to refill with 2 additional refills. Faxed to [facility] at [phone]. Dr Myna Hidalgo has encouraged the pt to obtain a PCP several times but the pt has declined to do so. Spoke to her about this. She agreed that it was time & is going to make an appt with [facility] in the [facility] building.

## 2012-07-14 ENCOUNTER — Encounter (INDEPENDENT_AMBULATORY_CARE_PROVIDER_SITE_OTHER): Payer: BC Managed Care – PPO | Admitting: Ophthalmology

## 2012-07-14 DIAGNOSIS — H5210 Myopia, unspecified eye: Secondary | ICD-10-CM

## 2012-07-14 DIAGNOSIS — H2510 Age-related nuclear cataract, unspecified eye: Secondary | ICD-10-CM

## 2012-07-14 DIAGNOSIS — H35439 Paving stone degeneration of retina, unspecified eye: Secondary | ICD-10-CM

## 2012-07-14 DIAGNOSIS — H43819 Vitreous degeneration, unspecified eye: Secondary | ICD-10-CM

## 2012-10-20 ENCOUNTER — Other Ambulatory Visit: Payer: Self-pay | Admitting: *Deleted

## 2012-10-20 NOTE — Telephone Encounter (Signed)
Received refill request for Tranxene from [facility]. Denied rx as per previous note on last refill request as she was to make an appt with a PCP.

## 2013-01-28 ENCOUNTER — Other Ambulatory Visit: Payer: Self-pay

## 2013-01-28 DIAGNOSIS — Z1231 Encounter for screening mammogram for malignant neoplasm of breast: Secondary | ICD-10-CM

## 2013-02-20 ENCOUNTER — Encounter (HOSPITAL_COMMUNITY): Payer: Self-pay | Admitting: Emergency Medicine

## 2013-02-20 ENCOUNTER — Emergency Department (HOSPITAL_COMMUNITY): Payer: BC Managed Care – PPO

## 2013-02-20 ENCOUNTER — Emergency Department (HOSPITAL_COMMUNITY)
Admission: EM | Admit: 2013-02-20 | Discharge: 2013-02-20 | Disposition: A | Discharge status: Home / Self Care | Payer: BC Managed Care – PPO | Attending: Emergency Medicine | Admitting: Emergency Medicine

## 2013-02-20 ENCOUNTER — Other Ambulatory Visit: Payer: Self-pay | Admitting: Medical

## 2013-02-20 ENCOUNTER — Other Ambulatory Visit: Payer: Self-pay

## 2013-02-20 ENCOUNTER — Telehealth: Payer: Self-pay | Admitting: Hematology & Oncology

## 2013-02-20 ENCOUNTER — Ambulatory Visit (HOSPITAL_BASED_OUTPATIENT_CLINIC_OR_DEPARTMENT_OTHER)
Admission: RE | Admit: 2013-02-20 | Discharge: 2013-02-20 | Disposition: A | Discharge status: Home / Self Care | Payer: BC Managed Care – PPO | Source: Ambulatory Visit | Attending: Medical | Admitting: Medical

## 2013-02-20 ENCOUNTER — Other Ambulatory Visit: Payer: Self-pay | Admitting: Oncology

## 2013-02-20 ENCOUNTER — Encounter (HOSPITAL_COMMUNITY)
Admission: RE | Admit: 2013-02-20 | Discharge: 2013-02-20 | Disposition: A | Discharge status: Home / Self Care | Payer: BC Managed Care – PPO | Source: Ambulatory Visit | Attending: Hematology & Oncology | Admitting: Hematology & Oncology

## 2013-02-20 ENCOUNTER — Ambulatory Visit (HOSPITAL_BASED_OUTPATIENT_CLINIC_OR_DEPARTMENT_OTHER): Payer: BC Managed Care – PPO | Admitting: Lab

## 2013-02-20 DIAGNOSIS — F329 Major depressive disorder, single episode, unspecified: Secondary | ICD-10-CM | POA: Insufficient documentation

## 2013-02-20 DIAGNOSIS — Z87891 Personal history of nicotine dependence: Secondary | ICD-10-CM | POA: Insufficient documentation

## 2013-02-20 DIAGNOSIS — R0602 Shortness of breath: Secondary | ICD-10-CM | POA: Insufficient documentation

## 2013-02-20 DIAGNOSIS — J209 Acute bronchitis, unspecified: Secondary | ICD-10-CM | POA: Insufficient documentation

## 2013-02-20 DIAGNOSIS — J3489 Other specified disorders of nose and nasal sinuses: Secondary | ICD-10-CM | POA: Insufficient documentation

## 2013-02-20 DIAGNOSIS — D693 Immune thrombocytopenic purpura: Secondary | ICD-10-CM

## 2013-02-20 DIAGNOSIS — Z862 Personal history of diseases of the blood and blood-forming organs and certain disorders involving the immune mechanism: Secondary | ICD-10-CM | POA: Insufficient documentation

## 2013-02-20 DIAGNOSIS — Z79899 Other long term (current) drug therapy: Secondary | ICD-10-CM | POA: Insufficient documentation

## 2013-02-20 DIAGNOSIS — R6883 Chills (without fever): Secondary | ICD-10-CM | POA: Insufficient documentation

## 2013-02-20 DIAGNOSIS — J329 Chronic sinusitis, unspecified: Secondary | ICD-10-CM

## 2013-02-20 DIAGNOSIS — R0789 Other chest pain: Secondary | ICD-10-CM

## 2013-02-20 DIAGNOSIS — IMO0001 Reserved for inherently not codable concepts without codable children: Secondary | ICD-10-CM | POA: Insufficient documentation

## 2013-02-20 DIAGNOSIS — R079 Chest pain, unspecified: Secondary | ICD-10-CM | POA: Insufficient documentation

## 2013-02-20 DIAGNOSIS — R110 Nausea: Secondary | ICD-10-CM | POA: Insufficient documentation

## 2013-02-20 DIAGNOSIS — Z8589 Personal history of malignant neoplasm of other organs and systems: Secondary | ICD-10-CM | POA: Insufficient documentation

## 2013-02-20 DIAGNOSIS — J40 Bronchitis, not specified as acute or chronic: Secondary | ICD-10-CM

## 2013-02-20 DIAGNOSIS — F3289 Other specified depressive episodes: Secondary | ICD-10-CM | POA: Insufficient documentation

## 2013-02-20 DIAGNOSIS — R5381 Other malaise: Secondary | ICD-10-CM | POA: Insufficient documentation

## 2013-02-20 HISTORY — DX: Nausea with vomiting, unspecified: R11.2

## 2013-02-20 HISTORY — DX: Other specified postprocedural states: Z98.890

## 2013-02-20 LAB — URINALYSIS, ROUTINE W REFLEX MICROSCOPIC
Bilirubin Urine: NEGATIVE
Glucose, UA: NEGATIVE mg/dL
Ketones, ur: NEGATIVE mg/dL
Protein, ur: NEGATIVE mg/dL
Urobilinogen, UA: 0.2 mg/dL (ref 0.0–1.0)

## 2013-02-20 LAB — COMPREHENSIVE METABOLIC PANEL
AST: 22 U/L (ref 0–37)
Albumin: 3.2 g/dL — ABNORMAL LOW (ref 3.5–5.2)
BUN: 13 mg/dL (ref 6–23)
Calcium: 9.1 mg/dL (ref 8.4–10.5)
Chloride: 102 mEq/L (ref 96–112)
Creatinine, Ser: 0.71 mg/dL (ref 0.50–1.10)
Total Bilirubin: 0.1 mg/dL — ABNORMAL LOW (ref 0.3–1.2)
Total Protein: 7.3 g/dL (ref 6.0–8.3)

## 2013-02-20 LAB — CBC WITH DIFFERENTIAL (CANCER CENTER ONLY)
BASO#: 0.1 10*3/uL (ref 0.0–0.2)
EOS%: 0 % (ref 0.0–7.0)
Eosinophils Absolute: 0 10*3/uL (ref 0.0–0.5)
HGB: 12.5 g/dL (ref 11.6–15.9)
LYMPH%: 12.3 % — ABNORMAL LOW (ref 14.0–48.0)
MCH: 30.7 pg (ref 26.0–34.0)
MCHC: 32.8 g/dL (ref 32.0–36.0)
MCV: 94 fL (ref 81–101)
MONO%: 11.1 % (ref 0.0–13.0)
NEUT#: 5 10*3/uL (ref 1.5–6.5)
Platelets: 9 10*3/uL — CL (ref 145–400)
RBC: 4.07 10*6/uL (ref 3.70–5.32)

## 2013-02-20 LAB — CBC WITH DIFFERENTIAL/PLATELET
Basophils Absolute: 0.1 10*3/uL (ref 0.0–0.1)
Eosinophils Absolute: 0 10*3/uL (ref 0.0–0.7)
Lymphs Abs: 1.4 10*3/uL (ref 0.7–4.0)
MCH: 30.6 pg (ref 26.0–34.0)
MCV: 91.9 fL (ref 78.0–100.0)
Monocytes Absolute: 0.9 10*3/uL (ref 0.1–1.0)
Neutrophils Relative %: 63 % (ref 43–77)
Platelets: 10 10*3/uL — CL (ref 150–400)
RBC: 3.96 MIL/uL (ref 3.87–5.11)
RDW: 13 % (ref 11.5–15.5)

## 2013-02-20 LAB — URINE MICROSCOPIC-ADD ON

## 2013-02-20 LAB — D-DIMER, QUANTITATIVE: D-Dimer, Quant: 0.53 ug{FEU}/mL — ABNORMAL HIGH (ref 0.00–0.48)

## 2013-02-20 LAB — TROPONIN I: Troponin I: <0.3 ng/mL (ref <0.30)

## 2013-02-20 MED ORDER — AZITHROMYCIN 250 MG PO TABS: ORAL_TABLET | ORAL | Status: DC

## 2013-02-20 MED ORDER — SODIUM CHLORIDE 0.9 % IV BOLUS (SEPSIS)
1000.0000 mL | Freq: Once | INTRAVENOUS | Status: AC
  Administered 2013-02-20: 1000 mL via INTRAVENOUS

## 2013-02-20 MED ORDER — IOHEXOL 350 MG/ML SOLN
100.0000 mL | Freq: Once | INTRAVENOUS | Status: AC | PRN
  Administered 2013-02-20: 100 mL via INTRAVENOUS

## 2013-02-20 NOTE — ED Notes (Signed)
Pt aware urine sample is needed 

## 2013-02-20 NOTE — Consult Note (Signed)
Medical Oncology 02-20-2013   Reason for Consult:thrombocytopenia Referring Physician: ED  Laura Bender is an 57 y.o. female who has been followed x19 years by Dr Arlan Organ for ITP, for which she had splenectomy remotely, seen in ED with respiratory infection and lower platelet count than her usual. Patient had upper respiratory symptoms 4 weeks ago, which resolved with amoxicillin x 10 days. On 02-16-13 she began with upper respiratory congestion and cough, which has worsened, now with some yellow sputum and temperature of 101 this AM. She was cold and chilling last pm, did not take temperature then. She saw small amount of blood in sputum x3 today, with hard coughing and sore anterior chest when she coughs. She has trouble taking deep breath due to coughing and nasal congestion. She has been drinking fluids, has not seen other bleeding or significant petechiae, no GI symptoms. She was given steroid nasal spray and prednisone 20 mg daily by PCP which she took on 02-18-13, 3-20 and 10 mg this AM;she has had no antibiotics in ~ 2.5 weeks.  She last needed treatment for ITP in 2007, initially with brief good improvement in platelets with pulse decadron, then prolonged response to Rituxan x 4 weeks.   Review of Systems: as above   CBCs today:  WBC 6.6, Hgb 12.5 and plt 9K, then repeat this afternoon WBC 6.5, Hgb 12.1 and plt 10k. I have reviewed peripheral smear: no schistocytes, some large platelets, no immature cells, consistent with ITP.  Allergies:  Allergies  Allergen Reactions  . Codeine Nausea And Vomiting   Past Medical History  Diagnosis Date  . Clotting disorder   . Lipoma 03-04-12    Rt. back about scapular ares  . 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  '95- blow normal levels, but stable.  . Fibromyalgia 03-04-12    Sporadic pain  . Depression 03-04-12    tx. meds  . PONV (postoperative nausea and vomiting)     Past Surgical History  Procedure Laterality  Date  . Splenectomy, total  1995  . Abdominal hysterectomy  1995  . Appendectomy  1995  . Irrigation and debridement abscess  03-04-12    x2- buttocks  . Parotid gland tumor excision      benign  . Breast surgery  03-04-12    12'12 -left needle biopsy(benign)-Titanium needle marker remains  . Mass excision  03/10/2012    Procedure: EXCISION MASS;  Surgeon: Adolph Pollack, MD;  Location: [facility];  Service: General;  Laterality: N/A;  Removal of back lipoma    Family History  Problem Relation Age of Onset  . Heart disease Maternal Grandmother   . Heart disease Maternal Grandfather   . Heart disease Paternal Grandmother   . Heart disease Paternal Grandfather     Social History:  reports that she quit smoking about 30 years ago. She does not have any smokeless tobacco history on file. She reports that  drinks alcohol. She reports that she does not use illicit drugs. Roommate has been ill with similar respiratory illness. Has worked this week.  Medications: as above  Blood pressure 116/62, pulse 90, temperature 99.6 F (37.6 C), temperature source Oral, SpO2 95.00%. Awake, alert, good historian, sounds nasally congested, shallow breaths trying not to cough. Not in acute distress tho looks uncomfortable. HEENT: PERRL, not icteric. Nasal mucosa boggy with thick drainage, no blood. Oral mucosa clear and moist, posterior pharynx with some erythema and some petechiae. No cervical or supraclavicular adenopathy.  Lungs with breath sounds bilaterally to bases, no wheezes or rales, no use of accessory muscles. Heart RRR. Abdomen soft, nontender, some bowel sounds.LE no edema, cords, tenderness. Skin with very few scattered petechiae on lower legs bilaterally. IV infusing LUE site ok.   Results for orders placed during the hospital encounter of 02/20/13 (from the past 48 hour(s))  CBC WITH DIFFERENTIAL     Status: Abnormal   Collection Time    02/20/13  2:40 PM      Result Value Range   WBC 6.5  4.0  - 10.5 K/uL   RBC 3.96  3.87 - 5.11 MIL/uL   Hemoglobin 12.1  12.0 - 15.0 g/dL   HCT 62.1  30.8 - 65.7 %   MCV 91.9  78.0 - 100.0 fL   MCH 30.6  26.0 - 34.0 pg   MCHC 33.2  30.0 - 36.0 g/dL   RDW 84.6  96.2 - 95.2 %   Platelets 10 (*) 150 - 400 K/uL   Comment: SPECIMEN CHECKED FOR CLOTS     RESULT REPEATED AND VERIFIED     CRITICAL RESULT CALLED TO, READ BACK BY AND VERIFIED WITH:     JEFFRIES,T. AT 1535 ON [id number] BY LOVE,T.   Neutrophils Relative 63  43 - 77 %   Lymphocytes Relative 22  12 - 46 %   Monocytes Relative 14 (*) 3 - 12 %   Eosinophils Relative 0  0 - 5 %   Basophils Relative 1  0 - 1 %   Neutro Abs 4.1  1.7 - 7.7 K/uL   Lymphs Abs 1.4  0.7 - 4.0 K/uL   Monocytes Absolute 0.9  0.1 - 1.0 K/uL   Eosinophils Absolute 0.0  0.0 - 0.7 K/uL   Basophils Absolute 0.1  0.0 - 0.1 K/uL   WBC Morphology VACUOLATED NEUTROPHILS     Smear Review PLATELET COUNT CONFIRMED BY SMEAR    COMPREHENSIVE METABOLIC PANEL     Status: Abnormal   Collection Time    02/20/13  2:40 PM      Result Value Range   Sodium 138  135 - 145 mEq/L   Potassium 3.2 (*) 3.5 - 5.1 mEq/L   Chloride 102  96 - 112 mEq/L   CO2 26  19 - 32 mEq/L   Glucose, Bld 94  70 - 99 mg/dL   BUN 13  6 - 23 mg/dL   Creatinine, Ser 4.01  0.50 - 1.10 mg/dL   Calcium 9.1  8.4 - 02.7 mg/dL   Total Protein 7.3  6.0 - 8.3 g/dL   Albumin 3.2 (*) 3.5 - 5.2 g/dL   AST 22  0 - 37 U/L   ALT 18  0 - 35 U/L   Alkaline Phosphatase 93  39 - 117 U/L   Total Bilirubin 0.1 (*) 0.3 - 1.2 mg/dL   GFR calc non Af Amer >90  >90 mL/min   GFR calc Af Amer >90  >90 mL/min   Comment:            The eGFR has been calculated     using the CKD EPI equation.     This calculation has not been     validated in all clinical     situations.     eGFR's persistently     <90 mL/min signify     possible Chronic Kidney Disease.  TROPONIN I     Status: None   Collection Time    02/20/13  2:40 PM      Result Value Range   Troponin I <0.30  <0.30  ng/mL   Comment:            Due to the release kinetics of cTnI,     a negative result within the first hours     of the onset of symptoms does not rule out     myocardial infarction with certainty.     If myocardial infarction is still suspected,     repeat the test at appropriate intervals.  D-DIMER, QUANTITATIVE     Status: Abnormal   Collection Time    02/20/13  2:40 PM      Result Value Range   D-Dimer, Quant 0.53 (*) 0.00 - 0.48 ug/mL-FEU   Comment:            AT THE INHOUSE ESTABLISHED CUTOFF     VALUE OF 0.48 ug/mL FEU,     THIS ASSAY HAS BEEN DOCUMENTED     IN THE LITERATURE TO HAVE     A SENSITIVITY AND NEGATIVE     PREDICTIVE VALUE OF AT LEAST     98 TO 99%.  THE TEST RESULT     SHOULD BE CORRELATED WITH     AN ASSESSMENT OF THE CLINICAL     PROBABILITY OF DVT / VTE.  TYPE AND SCREEN     Status: None   Collection Time    02/20/13  2:40 PM      Result Value Range   ABO/RH(D) O POS     Antibody Screen NEG     Sample Expiration 02/23/2013    URINALYSIS, ROUTINE W REFLEX MICROSCOPIC     Status: Abnormal   Collection Time    02/20/13  4:01 PM      Result Value Range   Color, Urine YELLOW  YELLOW   APPearance CLOUDY (*) CLEAR   Specific Gravity, Urine 1.022  1.005 - 1.030   pH 5.5  5.0 - 8.0   Glucose, UA NEGATIVE  NEGATIVE mg/dL   Hgb urine dipstick MODERATE (*) NEGATIVE   Bilirubin Urine NEGATIVE  NEGATIVE   Ketones, ur NEGATIVE  NEGATIVE mg/dL   Protein, ur NEGATIVE  NEGATIVE mg/dL   Urobilinogen, UA 0.2  0.0 - 1.0 mg/dL   Nitrite NEGATIVE  NEGATIVE   Leukocytes, UA NEGATIVE  NEGATIVE  URINE MICROSCOPIC-ADD ON     Status: Abnormal   Collection Time    02/20/13  4:01 PM      Result Value Range   RBC / HPF 3-6  <3 RBC/hpf   Bacteria, UA FEW (*) RARE   Urine-Other MUCOUS PRESENT      Dg Chest 2 View  02/20/2013  *RADIOLOGY REPORT*  Clinical Data: Chest pain.  CHEST - 2 VIEW  Comparison: 12/07/2009.  Findings: The cardiac silhouette, mediastinal and hilar  contours are within normal limits and stable.  The lungs are clear.  No pleural effusion.  The bony thorax is intact.  IMPRESSION: No acute cardiopulmonary findings.  No change since prior examination.   Original Report Authenticated By: Rudie Meyer, M.D.    Ct Angio Chest Pe W/cm &/or Wo Cm  02/20/2013  *RADIOLOGY REPORT*  Clinical Data: Fever, chills, nausea.  Coughing up blood.  Concern pulmonary embolism.  CT ANGIOGRAPHY CHEST  Technique:  Multidetector CT imaging of the chest using the standard protocol during bolus administration of intravenous contrast. Multiplanar reconstructed images including MIPs were obtained and reviewed to evaluate the vascular anatomy.  Contrast: OMNIPAQUE IOHEXOL 350 MG/ML SOLN  Comparison: CT 01/09/2012  Findings: No filling defects within the pulmonary suggest acute pulmonary embolism.  No acute findings of the aorta great vessels. No pericardial fluid.  Esophagus is normal.  There is a mildly enlarged subcarinal lymph node measuring 13 mm short axis.  There is enlarged hilar lymph node measuring 13 mm (image 90).  Review of the lung parenchyma demonstrates no pleural fluid or pneumothorax.  No focal consolidation.  Within the left lower lobe there is a 5 mm pulmonary nodule is unchanged from prior (image 48, series 12).  IMPRESSION:  1.  No evidence acute pulmonary embolism. 2.  Mild subcarinal and right hilar adenopathy is likely reactive. 3.  No evidence of pneumonia. 4.  5 mm nodule at the left lung base. If the patient is at high risk for bronchogenic carcinoma, follow-up chest CT at 6-12 months is recommended.  If the patient is at low risk for bronchogenic carcinoma, follow-up chest CT at 12 months is recommended.  This recommendation follows the consensus statement: Guidelines for Management of Small Pulmonary Nodules Detected on CT Scans: A Statement from the Fleischner Society as published in Radiology 2005; 237:395-400.   Original Report Authenticated By:  Genevive Bi, M.D.       Assessment/Plan: 1.Sinusitis, possibly bronchitis in patient who is post splenectomy: needs to be covered with antibiotics due to earlier fever and purulent secretions. Suggested mucinex and possibly cough suppressant and decongestant also. No history of environmental allergies, but I suggested keeping windows closed for now. 2.thrombocytopenia: looks consistent with ITP exacerbation, probably from recent viral illness. She does not want steroids. Count may increase without other intervention when the respiratory illness resolves. We can check CBC at [facility] office on 02-23-13 AM if she is not admitted. We can consider Rituxan again if needed next week. 3. Immunocompromised post splenectomy: if she is not admitted, she understands that she would need to be seen again if temp spikes or shaking chills.  LIVESAY,LENNIS P 02/20/2013, 5:59 PM

## 2013-02-20 NOTE — ED Provider Notes (Signed)
History     CSN: [id number]  Arrival date & time 02/20/13  1326   First MD Initiated Contact with Patient 02/20/13 1351      Chief Complaint  Patient presents with  . Cough  . Chest Pain    with cough  . Weakness    (Consider location/radiation/quality/duration/timing/severity/associated sxs/prior treatment) HPI Comments: Patient presents with one month of generalized weakness, shortness of breath, chest pain with coughing and congestion. Associated with chills and nausea. She of hemoptysis this morning which prompted her to call her oncologist. She was found to have thrombocytopenia at 9. She has a history of ITP. She denies any bleeding in her stools or vaginal bleeding. She's been on a course of antibiotics for bronchitis. She denies any leg pain or swelling. She denies any easy bleeding or bruising.  The history is provided by the patient.    Past Medical History  Diagnosis Date  . Clotting disorder   . Lipoma 03-04-12    Rt. back about scapular ares  . 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  '95- blow normal levels, but stable.  . Fibromyalgia 03-04-12    Sporadic pain  . Depression 03-04-12    tx. meds    Past Surgical History  Procedure Laterality Date  . Splenectomy, total  1995  . Abdominal hysterectomy  1995  . Appendectomy  1995  . Irrigation and debridement abscess  03-04-12    x2- buttocks  . Parotid gland tumor excision      benign  . Breast surgery  03-04-12    12'12 -left needle biopsy(benign)-Titanium needle marker remains  . Mass excision  03/10/2012    Procedure: EXCISION MASS;  Surgeon: Adolph Pollack, MD;  Location: [facility];  Service: General;  Laterality: N/A;  Removal of back lipoma    Family History  Problem Relation Age of Onset  . Heart disease Maternal Grandmother   . Heart disease Maternal Grandfather   . Heart disease Paternal Grandmother   . Heart disease Paternal Grandfather     History  Substance Use Topics  . Smoking  status: Former Games developer  . Smokeless tobacco: Not on file  . Alcohol Use: 0.0 oz/week    3-4 Cans of beer per week     Comment: weekends -beer    OB History   Grav Para Term Preterm Abortions TAB SAB Ect Mult Living                  Review of Systems  Constitutional: Positive for activity change, appetite change and fatigue. Negative for fever.  HENT: Negative for congestion and rhinorrhea.   Respiratory: Positive for cough, chest tightness and shortness of breath.   Cardiovascular: Negative for chest pain.  Gastrointestinal: Negative for nausea, vomiting and abdominal pain.  Genitourinary: Negative for dysuria.  Musculoskeletal: Positive for myalgias and arthralgias. Negative for back pain.  Skin: Negative for wound.  Neurological: Positive for weakness. Negative for dizziness and headaches.  A complete 10 system review of systems was obtained and all systems are negative except as noted in the HPI and PMH.    Allergies  Codeine  Home Medications   Current Outpatient Rx  Name  Route  Sig  Dispense  Refill  . clorazepate (TRANXENE) 7.5 MG tablet   Oral   Take 1 tablet (7.5 mg total) by mouth daily at 8 pm.   30 tablet   2   . FLUoxetine (PROZAC) 20 MG capsule   Oral  Take 20 mg by mouth daily with breakfast.         . PREMARIN 1.25 MG tablet      TAKE 1 TABLET BY MOUTH EVERY DAY   30 tablet   PRN   . traZODone (DESYREL) 50 MG tablet   Oral   Take 50 mg by mouth at bedtime.           BP 115/65  Pulse 93  Temp(Src) 99.6 F (37.6 C) (Oral)  SpO2 95%  Physical Exam  Constitutional: She is oriented to person, place, and time. She appears well-developed and well-nourished. No distress.  HENT:  Head: Normocephalic and atraumatic.  Mouth/Throat: Oropharynx is clear and moist. No oropharyngeal exudate.  Nasal congestion  Eyes: Conjunctivae and EOM are normal. Pupils are equal, round, and reactive to light.  Neck: Normal range of motion. Neck supple.   Cardiovascular: Normal rate, regular rhythm and normal heart sounds.   No murmur heard. Pulmonary/Chest: Effort normal and breath sounds normal. No respiratory distress.  Abdominal: Soft. There is no tenderness. There is no rebound and no guarding.  Musculoskeletal: Normal range of motion. She exhibits no edema and no tenderness.  Neurological: She is alert and oriented to person, place, and time. No cranial nerve deficit. She exhibits normal muscle tone. Coordination normal.  Skin: Skin is warm. No rash noted.  No evidence petechiae    ED Course  Procedures (including critical care time)  Labs Reviewed  CBC WITH DIFFERENTIAL  COMPREHENSIVE METABOLIC PANEL  TROPONIN I  D-DIMER, QUANTITATIVE  URINALYSIS, ROUTINE W REFLEX MICROSCOPIC  TYPE AND SCREEN   Dg Chest 2 View  02/20/2013  *RADIOLOGY REPORT*  Clinical Data: Chest pain.  CHEST - 2 VIEW  Comparison: 12/07/2009.  Findings: The cardiac silhouette, mediastinal and hilar contours are within normal limits and stable.  The lungs are clear.  No pleural effusion.  The bony thorax is intact.  IMPRESSION: No acute cardiopulmonary findings.  No change since prior examination.   Original Report Authenticated By: Rudie Meyer, M.D.      No diagnosis found.    MDM  One month of generalized body aches, cough, chest pain, shortness of breath, myalgias. History of ITP with thrombocytopenia. CXR negative. Presentation consistent with bronchitis/sinusitis.   Patient's thrombocytopenia discuss with Dr. Darrold Span of oncology. She agrees with workup including d-dimer.  Patient not interested in taking steroids, though she has taken 20 mg prednisone daily for the past 3 days.  CT angiogram of chest pending at time of signout to Dr. Adriana Simas. Oncology consult pending as well.   Date: 02/20/2013  Rate: 93  Rhythm: normal sinus rhythm  QRS Axis: normal  Intervals: normal  ST/T Wave abnormalities: normal  Conduction Disutrbances:none  Narrative  Interpretation:   Old EKG Reviewed: none available     Glynn Octave, MD 02/21/13 1326

## 2013-02-20 NOTE — Telephone Encounter (Signed)
Left pt message with 11:15 am lab and she needs to stay for results and to call if the time is not ok.

## 2013-02-20 NOTE — ED Notes (Signed)
Pt c/o cough with SOB and chest pain. Chest pain only happens when coughing on the right side. States she has chills, fever, and nausea. Last night she was having night sweats. Pt was coughing up bright red blood this morning.

## 2013-02-23 ENCOUNTER — Telehealth: Payer: Self-pay | Admitting: Oncology

## 2013-02-23 ENCOUNTER — Other Ambulatory Visit: Payer: Self-pay | Admitting: Oncology

## 2013-02-23 ENCOUNTER — Other Ambulatory Visit (HOSPITAL_BASED_OUTPATIENT_CLINIC_OR_DEPARTMENT_OTHER): Payer: BC Managed Care – PPO | Admitting: Lab

## 2013-02-23 ENCOUNTER — Other Ambulatory Visit: Payer: Self-pay

## 2013-02-23 DIAGNOSIS — D693 Immune thrombocytopenic purpura: Secondary | ICD-10-CM

## 2013-02-23 LAB — CBC WITH DIFFERENTIAL/PLATELET
Basophils Absolute: 0.1 10*3/uL (ref 0.0–0.1)
Eosinophils Absolute: 0.1 10*3/uL (ref 0.0–0.5)
HGB: 13 g/dL (ref 11.6–15.9)
MCV: 91.8 fL (ref 79.5–101.0)
MONO#: 0.9 10*3/uL (ref 0.1–0.9)
MONO%: 18.3 % — ABNORMAL HIGH (ref 0.0–14.0)
NEUT#: 1.4 10*3/uL — ABNORMAL LOW (ref 1.5–6.5)
RBC: 4.29 10*6/uL (ref 3.70–5.45)
RDW: 13.3 % (ref 11.2–14.5)
WBC: 4.8 10*3/uL (ref 3.9–10.3)
lymph#: 2.3 10*3/uL (ref 0.9–3.3)
nRBC: 0 % (ref 0–0)

## 2013-02-23 NOTE — Telephone Encounter (Signed)
Gave pt appt for lab on 3/28

## 2013-02-23 NOTE — Progress Notes (Signed)
Ms. Delamater here to follow up from ED visit 02-20-13 with URI and low plt. count.   She is on a Z-pack.  Afebrile 98.6 since Sat. 02-21-13.  Productive cough with yellow phlegm. No blood in sputum. Using Mucinex  and Claritin.  Drinking a lot of water. She is feeling better.  Told Ms. Silguero that Dr. Darrold Span said that her plts. A stable at 12k.She would like to check her counts again either 02-27-13 or 03-02-13. Dr. Darrold Span said that the small nodule in the left lung base on the CT in the ED 02-20-13 is not concerning at this time as she has been very sick.  She will need a follow up CT in 6-12 months as recommended by radiologist to re-evaluate the area. Ms. Sabo is to call Dr. Precious Reel office if she has further problems prior to return visit with Dr. Myna Hidalgo.  Pt. Verbalized understanding.

## 2013-02-26 ENCOUNTER — Ambulatory Visit
Admission: RE | Admit: 2013-02-26 | Discharge: 2013-02-26 | Disposition: A | Discharge status: Home / Self Care | Payer: BC Managed Care – PPO | Source: Ambulatory Visit

## 2013-02-26 DIAGNOSIS — Z1231 Encounter for screening mammogram for malignant neoplasm of breast: Secondary | ICD-10-CM

## 2013-02-27 ENCOUNTER — Telehealth: Payer: Self-pay

## 2013-02-27 ENCOUNTER — Other Ambulatory Visit: Payer: Self-pay | Admitting: Oncology

## 2013-02-27 ENCOUNTER — Other Ambulatory Visit (HOSPITAL_BASED_OUTPATIENT_CLINIC_OR_DEPARTMENT_OTHER): Payer: BC Managed Care – PPO

## 2013-02-27 DIAGNOSIS — D693 Immune thrombocytopenic purpura: Secondary | ICD-10-CM

## 2013-02-27 LAB — MORPHOLOGY: PLT EST: DECREASED

## 2013-02-27 LAB — CBC WITH DIFFERENTIAL/PLATELET
BASO%: 0.8 % (ref 0.0–2.0)
Eosinophils Absolute: 0.1 10*3/uL (ref 0.0–0.5)
HCT: 39.4 % (ref 34.8–46.6)
MCHC: 33 g/dL (ref 31.5–36.0)
MONO#: 0.8 10*3/uL (ref 0.1–0.9)
NEUT#: 1.5 10*3/uL (ref 1.5–6.5)
NEUT%: 29.6 % — ABNORMAL LOW (ref 38.4–76.8)
WBC: 5 10*3/uL (ref 3.9–10.3)
lymph#: 2.6 10*3/uL (ref 0.9–3.3)

## 2013-02-27 NOTE — Telephone Encounter (Signed)
Lm for Ms. Konopka that a message would be left with Dr. Gustavo Lah nurse in [place] to call and set up a follow up appointment with Dr. Myna Hidalgo in the next few weeks to discuss her ITP and if restarting Rituxan would be appropriate with her platelet count gradually declining  and very low with recent respiratory infection ED as mentioned by patient in lab follow up of ED consult by Dr. Darrold Span. Spoke with Alvino Chapel RN at Dr. Joycelyn Das office and will follow up with Ms. Harmon regarding above.  Platelet count was up to 133k today and Pt. Felt much better from the infection.

## 2013-03-03 LAB — HM MAMMOGRAPHY: HM Mammogram: NORMAL

## 2013-04-15 ENCOUNTER — Other Ambulatory Visit: Payer: Self-pay | Admitting: Family Medicine

## 2013-04-15 ENCOUNTER — Ambulatory Visit (INDEPENDENT_AMBULATORY_CARE_PROVIDER_SITE_OTHER): Payer: BC Managed Care – PPO | Admitting: Family Medicine

## 2013-04-15 ENCOUNTER — Encounter: Payer: Self-pay | Admitting: Family Medicine

## 2013-04-15 VITALS — BP 102/70 | HR 60 | Temp 97.7°F | Ht 68.0 in | Wt 186.0 lb

## 2013-04-15 DIAGNOSIS — Z23 Encounter for immunization: Secondary | ICD-10-CM

## 2013-04-15 DIAGNOSIS — M797 Fibromyalgia: Secondary | ICD-10-CM

## 2013-04-15 DIAGNOSIS — T7840XA Allergy, unspecified, initial encounter: Secondary | ICD-10-CM

## 2013-04-15 DIAGNOSIS — IMO0001 Reserved for inherently not codable concepts without codable children: Secondary | ICD-10-CM

## 2013-04-15 DIAGNOSIS — D693 Immune thrombocytopenic purpura: Secondary | ICD-10-CM

## 2013-04-15 DIAGNOSIS — R222 Localized swelling, mass and lump, trunk: Secondary | ICD-10-CM

## 2013-04-15 DIAGNOSIS — F341 Dysthymic disorder: Secondary | ICD-10-CM

## 2013-04-15 DIAGNOSIS — F418 Other specified anxiety disorders: Secondary | ICD-10-CM

## 2013-04-15 DIAGNOSIS — Z0000 Encounter for general adult medical examination without abnormal findings: Secondary | ICD-10-CM

## 2013-04-15 NOTE — Telephone Encounter (Signed)
Please advise refill? Last RX was wrote by Dr Myna Hidalgo on 05-30-12 quantity 30 with 2 refills?

## 2013-04-15 NOTE — Telephone Encounter (Signed)
Pam from the pharmacy called requesting a new prescription for clorazepate for patient

## 2013-04-15 NOTE — Patient Instructions (Addendum)
Rel of [facility] at [employer] with any concerns Next visit annual gyn with labs prior to visit, lipid, renal, cbc, tsh, hepatic   Preventive Care for Adults, Female A healthy lifestyle and preventive care can promote health and wellness. Preventive health guidelines for women include the following key practices.  A routine yearly physical is a good way to check with your caregiver about your health and preventive screening. It is a chance to share any concerns and updates on your health, and to receive a thorough exam.  Visit your dentist for a routine exam and preventive care every 6 months. Brush your teeth twice a day and floss once a day. Good oral hygiene prevents tooth decay and gum disease.  The frequency of eye exams is based on your age, health, family medical history, use of contact lenses, and other factors. Follow your caregiver's recommendations for frequency of eye exams.  Eat a healthy diet. Foods like vegetables, fruits, whole grains, low-fat dairy products, and lean protein foods contain the nutrients you need without too many calories. Decrease your intake of foods high in solid fats, added sugars, and salt. Eat the right amount of calories for you.Get information about a proper diet from your caregiver, if necessary.  Regular physical exercise is one of the most important things you can do for your health. Most adults should get at least 150 minutes of moderate-intensity exercise (any activity that increases your heart rate and causes you to sweat) each week. In addition, most adults need muscle-strengthening exercises on 2 or more days a week.  Maintain a healthy weight. The body mass index (BMI) is a screening tool to identify possible weight problems. It provides an estimate of body fat based on height and weight. Your caregiver can help determine your BMI, and can help you achieve or maintain a healthy weight.For adults 20 years and older:  A BMI below 18.5 is  considered underweight.  A BMI of 18.5 to 24.9 is normal.  A BMI of 25 to 29.9 is considered overweight.  A BMI of 30 and above is considered obese.  Maintain normal blood lipids and cholesterol levels by exercising and minimizing your intake of saturated fat. Eat a balanced diet with plenty of fruit and vegetables. Blood tests for lipids and cholesterol should begin at age 25 and be repeated every 5 years. If your lipid or cholesterol levels are high, you are over 50, or you are at high risk for heart disease, you may need your cholesterol levels checked more frequently.Ongoing high lipid and cholesterol levels should be treated with medicines if diet and exercise are not effective.  If you smoke, find out from your caregiver how to quit. If you do not use tobacco, do not start.  If you are pregnant, do not drink alcohol. If you are breastfeeding, be very cautious about drinking alcohol. If you are not pregnant and choose to drink alcohol, do not exceed 1 drink per day. One drink is considered to be 12 ounces (355 mL) of beer, 5 ounces (148 mL) of wine, or 1.5 ounces (44 mL) of liquor.  Avoid use of street drugs. Do not share needles with anyone. Ask for help if you need support or instructions about stopping the use of drugs.  High blood pressure causes heart disease and increases the risk of stroke. Your blood pressure should be checked at least every 1 to 2 years. Ongoing high blood pressure should be treated with medicines if weight loss  and exercise are not effective.  If you are 35 to 56 years old, ask your caregiver if you should take aspirin to prevent strokes.  Diabetes screening involves taking a blood sample to check your fasting blood sugar level. This should be done once every 3 years, after age 62, if you are within normal weight and without risk factors for diabetes. Testing should be considered at a younger age or be carried out more frequently if you are overweight and have at  least 1 risk factor for diabetes.  Breast cancer screening is essential preventive care for women. You should practice "breast self-awareness." This means understanding the normal appearance and feel of your breasts and may include breast self-examination. Any changes detected, no matter how small, should be reported to a caregiver. Women in their 13s and 30s should have a clinical breast exam (CBE) by a caregiver as part of a regular health exam every 1 to 3 years. After age 38, women should have a CBE every year. Starting at age 41, women should consider having a mammography (breast X-ray test) every year. Women who have a family history of breast cancer should talk to their caregiver about genetic screening. Women at a high risk of breast cancer should talk to their caregivers about having magnetic resonance imaging (MRI) and a mammography every year.  The Pap test is a screening test for cervical cancer. A Pap test can show cell changes on the cervix that might become cervical cancer if left untreated. A Pap test is a procedure in which cells are obtained and examined from the lower end of the uterus (cervix).  Women should have a Pap test starting at age 52.  Between ages 48 and 6, Pap tests should be repeated every 2 years.  Beginning at age 64, you should have a Pap test every 3 years as long as the past 3 Pap tests have been normal.  Some women have medical problems that increase the chance of getting cervical cancer. Talk to your caregiver about these problems. It is especially important to talk to your caregiver if a new problem develops soon after your last Pap test. In these cases, your caregiver may recommend more frequent screening and Pap tests.  The above recommendations are the same for women who have or have not gotten the vaccine for human papillomavirus (HPV).  If you had a hysterectomy for a problem that was not cancer or a condition that could lead to cancer, then you no longer  need Pap tests. Even if you no longer need a Pap test, a regular exam is a good idea to make sure no other problems are starting.  If you are between ages 31 and 41, and you have had normal Pap tests going back 10 years, you no longer need Pap tests. Even if you no longer need a Pap test, a regular exam is a good idea to make sure no other problems are starting.  If you have had past treatment for cervical cancer or a condition that could lead to cancer, you need Pap tests and screening for cancer for at least 20 years after your treatment.  If Pap tests have been discontinued, risk factors (such as a new sexual partner) need to be reassessed to determine if screening should be resumed.  The HPV test is an additional test that may be used for cervical cancer screening. The HPV test looks for the virus that can cause the cell changes on the cervix. The  cells collected during the Pap test can be tested for HPV. The HPV test could be used to screen women aged 70 years and older, and should be used in women of any age who have unclear Pap test results. After the age of 48, women should have HPV testing at the same frequency as a Pap test.  Colorectal cancer can be detected and often prevented. Most routine colorectal cancer screening begins at the age of 15 and continues through age 44. However, your caregiver may recommend screening at an earlier age if you have risk factors for colon cancer. On a yearly basis, your caregiver may provide home test kits to check for hidden blood in the stool. Use of a small camera at the end of a tube, to directly examine the colon (sigmoidoscopy or colonoscopy), can detect the earliest forms of colorectal cancer. Talk to your caregiver about this at age 20, when routine screening begins. Direct examination of the colon should be repeated every 5 to 10 years through age 72, unless early forms of pre-cancerous polyps or small growths are found.  Hepatitis C blood testing is  recommended for all people born from [birth year] through 1965 and any individual with known risks for hepatitis C.  Practice safe sex. Use condoms and avoid high-risk sexual practices to reduce the spread of sexually transmitted infections (STIs). STIs include gonorrhea, chlamydia, syphilis, trichomonas, herpes, HPV, and human immunodeficiency virus (HIV). Herpes, HIV, and HPV are viral illnesses that have no cure. They can result in disability, cancer, and death. Sexually active women aged 70 and younger should be checked for chlamydia. Older women with new or multiple partners should also be tested for chlamydia. Testing for other STIs is recommended if you are sexually active and at increased risk.  Osteoporosis is a disease in which the bones lose minerals and strength with aging. This can result in serious bone fractures. The risk of osteoporosis can be identified using a bone density scan. Women ages 65 and over and women at risk for fractures or osteoporosis should discuss screening with their caregivers. Ask your caregiver whether you should take a calcium supplement or vitamin D to reduce the rate of osteoporosis.  Menopause can be associated with physical symptoms and risks. Hormone replacement therapy is available to decrease symptoms and risks. You should talk to your caregiver about whether hormone replacement therapy is right for you.  Use sunscreen with sun protection factor (SPF) of 30 or more. Apply sunscreen liberally and repeatedly throughout the day. You should seek shade when your shadow is shorter than you. Protect yourself by wearing long sleeves, pants, a wide-brimmed hat, and sunglasses year round, whenever you are outdoors.  Once a month, do a whole body skin exam, using a mirror to look at the skin on your back. Notify your caregiver of new moles, moles that have irregular borders, moles that are larger than a pencil eraser, or moles that have changed in shape or color.  Stay current  with required immunizations.  Influenza. You need a dose every fall (or winter). The composition of the flu vaccine changes each year, so being vaccinated once is not enough.  Pneumococcal polysaccharide. You need 1 to 2 doses if you smoke cigarettes or if you have certain chronic medical conditions. You need 1 dose at age 59 (or older) if you have never been vaccinated.  Tetanus, diphtheria, pertussis (Tdap, Td). Get 1 dose of Tdap vaccine if you are younger than age 80, are over 46  and have contact with an infant, are a Research scientist (physical sciences), are pregnant, or simply want to be protected from whooping cough. After that, you need a Td booster dose every 10 years. Consult your caregiver if you have not had at least 3 tetanus and diphtheria-containing shots sometime in your life or have a deep or dirty wound.  HPV. You need this vaccine if you are a woman age 58 or younger. The vaccine is given in 3 doses over 6 months.  Measles, mumps, rubella (MMR). You need at least 1 dose of MMR if you were born in 1957 or later. You may also need a second dose.  Meningococcal. If you are age 11 to 61 and a first-year college student living in a residence hall, or have one of several medical conditions, you need to get vaccinated against meningococcal disease. You may also need additional booster doses.  Zoster (shingles). If you are age 79 or older, you should get this vaccine.  Varicella (chickenpox). If you have never had chickenpox or you were vaccinated but received only 1 dose, talk to your caregiver to find out if you need this vaccine.  Hepatitis A. You need this vaccine if you have a specific risk factor for hepatitis A virus infection or you simply wish to be protected from this disease. The vaccine is usually given as 2 doses, 6 to 18 months apart.  Hepatitis B. You need this vaccine if you have a specific risk factor for hepatitis B virus infection or you simply wish to be protected from this disease.  The vaccine is given in 3 doses, usually over 6 months. Preventive Services / Frequency Ages 59 to 17  Blood pressure check.** / Every 1 to 2 years.  Lipid and cholesterol check.** / Every 5 years beginning at age 12.  Clinical breast exam.** / Every 3 years for women in their 40s and 30s.  Pap test.** / Every 2 years from ages 39 through 75. Every 3 years starting at age 88 through age 37 or 77 with a history of 3 consecutive normal Pap tests.  HPV screening.** / Every 3 years from ages 30 through ages 42 to 17 with a history of 3 consecutive normal Pap tests.  Hepatitis C blood test.** / For any individual with known risks for hepatitis C.  Skin self-exam. / Monthly.  Influenza immunization.** / Every year.  Pneumococcal polysaccharide immunization.** / 1 to 2 doses if you smoke cigarettes or if you have certain chronic medical conditions.  Tetanus, diphtheria, pertussis (Tdap, Td) immunization. / A one-time dose of Tdap vaccine. After that, you need a Td booster dose every 10 years.  HPV immunization. / 3 doses over 6 months, if you are 38 and younger.  Measles, mumps, rubella (MMR) immunization. / You need at least 1 dose of MMR if you were born in 1957 or later. You may also need a second dose.  Meningococcal immunization. / 1 dose if you are age 51 to 56 and a first-year college student living in a residence hall, or have one of several medical conditions, you need to get vaccinated against meningococcal disease. You may also need additional booster doses.  Varicella immunization.** / Consult your caregiver.  Hepatitis A immunization.** / Consult your caregiver. 2 doses, 6 to 18 months apart.  Hepatitis B immunization.** / Consult your caregiver. 3 doses usually over 6 months. Ages 69 to 64  Blood pressure check.** / Every 1 to 2 years.  Lipid and cholesterol check.** /  Every 5 years beginning at age 37.  Clinical breast exam.** / Every year after age  93.  Mammogram.** / Every year beginning at age 13 and continuing for as long as you are in good health. Consult with your caregiver.  Pap test.** / Every 3 years starting at age 56 through age 25 or 67 with a history of 3 consecutive normal Pap tests.  HPV screening.** / Every 3 years from ages 29 through ages 28 to 63 with a history of 3 consecutive normal Pap tests.  Fecal occult blood test (FOBT) of stool. / Every year beginning at age 55 and continuing until age 73. You may not need to do this test if you get a colonoscopy every 10 years.  Flexible sigmoidoscopy or colonoscopy.** / Every 5 years for a flexible sigmoidoscopy or every 10 years for a colonoscopy beginning at age 81 and continuing until age 40.  Hepatitis C blood test.** / For all people born from [birth year] through 1965 and any individual with known risks for hepatitis C.  Skin self-exam. / Monthly.  Influenza immunization.** / Every year.  Pneumococcal polysaccharide immunization.** / 1 to 2 doses if you smoke cigarettes or if you have certain chronic medical conditions.  Tetanus, diphtheria, pertussis (Tdap, Td) immunization.** / A one-time dose of Tdap vaccine. After that, you need a Td booster dose every 10 years.  Measles, mumps, rubella (MMR) immunization. / You need at least 1 dose of MMR if you were born in 1957 or later. You may also need a second dose.  Varicella immunization.** / Consult your caregiver.  Meningococcal immunization.** / Consult your caregiver.  Hepatitis A immunization.** / Consult your caregiver. 2 doses, 6 to 18 months apart.  Hepatitis B immunization.** / Consult your caregiver. 3 doses, usually over 6 months. Ages 33 and over  Blood pressure check.** / Every 1 to 2 years.  Lipid and cholesterol check.** / Every 5 years beginning at age 45.  Clinical breast exam.** / Every year after age 26.  Mammogram.** / Every year beginning at age 69 and continuing for as long as you are in good  health. Consult with your caregiver.  Pap test.** / Every 3 years starting at age 23 through age 64 or 67 with a 3 consecutive normal Pap tests. Testing can be stopped between 65 and 70 with 3 consecutive normal Pap tests and no abnormal Pap or HPV tests in the past 10 years.  HPV screening.** / Every 3 years from ages 65 through ages 32 or 82 with a history of 3 consecutive normal Pap tests. Testing can be stopped between 65 and 70 with 3 consecutive normal Pap tests and no abnormal Pap or HPV tests in the past 10 years.  Fecal occult blood test (FOBT) of stool. / Every year beginning at age 52 and continuing until age 30. You may not need to do this test if you get a colonoscopy every 10 years.  Flexible sigmoidoscopy or colonoscopy.** / Every 5 years for a flexible sigmoidoscopy or every 10 years for a colonoscopy beginning at age 94 and continuing until age 22.  Hepatitis C blood test.** / For all people born from [birth year] through 1964 and any individual with known risks for hepatitis C.  Osteoporosis screening.** / A one-time screening for women ages 28 and over and women at risk for fractures or osteoporosis.  Skin self-exam. / Monthly.  Influenza immunization.** / Every year.  Pneumococcal polysaccharide immunization.** / 1 dose at age 21 (  or older) if you have never been vaccinated.  Tetanus, diphtheria, pertussis (Tdap, Td) immunization. / A one-time dose of Tdap vaccine if you are over 65 and have contact with an infant, are a Research scientist (physical sciences), or simply want to be protected from whooping cough. After that, you need a Td booster dose every 10 years.  Varicella immunization.** / Consult your caregiver.  Meningococcal immunization.** / Consult your caregiver.  Hepatitis A immunization.** / Consult your caregiver. 2 doses, 6 to 18 months apart.  Hepatitis B immunization.** / Check with your caregiver. 3 doses, usually over 6 months. ** Family history and personal history of risk and  conditions may change your caregiver's recommendations. Document Released: 01/15/2002 Document Revised: 02/11/2012 Document Reviewed: 04/16/2011 [facility] Patient Information 2013 [place], [place].

## 2013-04-15 NOTE — Telephone Encounter (Signed)
OK to refill with same strength, same sig, disp #30 2 rf

## 2013-04-16 MED ORDER — CLORAZEPATE DIPOTASSIUM 7.5 MG PO TABS: 7.5000 mg | ORAL_TABLET | Freq: Every day | ORAL | Status: DC

## 2013-04-17 ENCOUNTER — Encounter: Payer: Self-pay | Admitting: Family Medicine

## 2013-04-17 DIAGNOSIS — Z0000 Encounter for general adult medical examination without abnormal findings: Secondary | ICD-10-CM | POA: Insufficient documentation

## 2013-04-17 DIAGNOSIS — F418 Other specified anxiety disorders: Secondary | ICD-10-CM

## 2013-04-17 DIAGNOSIS — M797 Fibromyalgia: Secondary | ICD-10-CM | POA: Insufficient documentation

## 2013-04-17 DIAGNOSIS — T7840XA Allergy, unspecified, initial encounter: Secondary | ICD-10-CM

## 2013-04-17 HISTORY — DX: Other specified anxiety disorders: F41.8

## 2013-04-17 HISTORY — DX: Allergy, unspecified, initial encounter: T78.40XA

## 2013-04-17 NOTE — Assessment & Plan Note (Signed)
requires treatment intermittently. develops petechiae.

## 2013-04-17 NOTE — Assessment & Plan Note (Signed)
Consider Cetirizine prn.

## 2013-04-17 NOTE — Assessment & Plan Note (Signed)
Using Prozac with decent results, is noting some increased anhedonia, no changes for now.

## 2013-04-17 NOTE — Assessment & Plan Note (Signed)
Chronic pain, add krill oil caps, increase exercise.

## 2013-04-17 NOTE — Progress Notes (Signed)
[name], female   DOB: 1956-03-28, 57 y.o.   MRN: [id number] Laura Bender [id number] Feb 03, 1956 04/17/2013      Progress Note New Patient  Subjective  Chief Complaint  Chief Complaint  Patient presents with  . Annual Exam    est new patient    HPI  Patient 81 Caucasian female in today for new patient appointment. 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 She has recurrent flares up with oncology she has been sick off and on all winter and will no fevers or chills. Feeling better at this time. Has been evaluated in [facility] and [place]. Has recently been treated for bronchitis with antibiotics and symptoms are improved. No recent petechiae  Past Medical History  Diagnosis Date  . Clotting disorder   . Lipoma 03-04-12    Rt. back about scapular ares  . 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  '95- blow normal levels, but stable.  . Fibromyalgia 03-04-12    Sporadic pain  . Depression 03-04-12    tx. meds  . PONV (postoperative nausea and vomiting)   . Allergic state 04/17/2013  . Depression with anxiety 04/17/2013  . Preventative health care 04/17/2013    Past Surgical History  Procedure Laterality Date  . Splenectomy, total  1995  . Appendectomy  1995  . Irrigation and debridement abscess  03-04-12    x2- buttocks  . Parotid gland tumor excision Left     benign  . Breast surgery  03-04-12    12'12 -left needle biopsy(benign)-Titanium needle marker remains  . Mass excision  03/10/2012    Procedure: EXCISION MASS;  Surgeon: Adolph Pollack, MD;  Location: [facility];  Service: General;  Laterality: N/A;  Removal of back lipoma  . Abdominal hysterectomy  1995    total, endometriosis, fibroid, ovarian atrophy    Family History  Problem Relation Age of Onset  . Heart disease Maternal Grandmother   . Heart disease Maternal Grandfather   . Heart attack Maternal Grandfather 62  . Heart disease  Paternal Grandmother   . Heart attack Paternal Grandmother 23  . Heart disease Paternal Grandfather   . Cancer Paternal Grandfather     chest- smoker  . Diabetes Mother     type 2- controlled by diet  . Atrial fibrillation Father   . Hyperlipidemia Sister   . Depression Sister     History   Social History  . Marital Status: Single    Spouse Name: N/A    Number of Children: N/A  . Years of Education: N/A   Occupational History  . Not on file.   Social History Main Topics  . Smoking status: Former Smoker -- 5 years    Quit date: 02/21/1983  . Smokeless tobacco: Not on file  . Alcohol Use: 0.0 oz/week    3-4 Cans of beer per week     Comment: weekends -beer  . Drug Use: No  . Sexually Active: No   Other Topics Concern  . Not on file   Social History Narrative  . No narrative on file    Current Outpatient Prescriptions on File Prior to Visit  Medication Sig Dispense Refill  . acetaminophen (TYLENOL) 500 MG tablet Take 1,000 mg by mouth every 6 (six) hours as needed for pain.      Marland Kitchen estrogens, conjugated, (PREMARIN) 1.25 MG tablet Take 1.25 mg by mouth daily.      Marland Kitchen  FLUoxetine (PROZAC) 20 MG capsule Take 20 mg by mouth daily with breakfast.      . traZODone (DESYREL) 50 MG tablet Take 50 mg by mouth at bedtime.       No current facility-administered medications on file prior to visit.    Allergies  Allergen Reactions  . Codeine Nausea And Vomiting    Review of Systems  Review of Systems  Constitutional: Negative for fever, chills and malaise/fatigue.  HENT: Negative for hearing loss, nosebleeds and congestion.   Eyes: Negative for discharge.  Respiratory: Negative for cough, sputum production, shortness of breath and wheezing.   Cardiovascular: Negative for chest pain, palpitations and leg swelling.  Gastrointestinal: Negative for heartburn, nausea, vomiting, abdominal pain, diarrhea, constipation and blood in stool.  Genitourinary: Negative for dysuria,  urgency, frequency and hematuria.  Musculoskeletal: Negative for myalgias, back pain and falls.  Skin: Negative for rash.  Neurological: Negative for dizziness, tremors, sensory change, focal weakness, loss of consciousness, weakness and headaches.  Endo/Heme/Allergies: Negative for polydipsia. Does not bruise/bleed easily.  Psychiatric/Behavioral: Negative for depression and suicidal ideas. The patient is not nervous/anxious and does not have insomnia.     Objective  BP 102/70  Pulse 60  Temp(Src) 97.7 F (36.5 C) (Oral)  Ht 5\' 8"  (1.727 m)  Wt 186 lb (84.369 kg)  BMI 28.29 kg/m2  SpO2 99%  Physical Exam  Physical Exam  Constitutional: She is oriented to person, place, and time and well-developed, well-nourished, and in no distress. No distress.  HENT:  Head: Normocephalic and atraumatic.  Right Ear: External ear normal.  Left Ear: External ear normal.  Nose: Nose normal.  Mouth/Throat: Oropharynx is clear and moist. No oropharyngeal exudate.  Eyes: Conjunctivae are normal. Pupils are equal, round, and reactive to light. Right eye exhibits no discharge. Left eye exhibits no discharge. No scleral icterus.  Neck: Normal range of motion. Neck supple. No thyromegaly present.  Cardiovascular: Normal rate, regular rhythm, normal heart sounds and intact distal pulses.   No murmur heard. Pulmonary/Chest: Effort normal and breath sounds normal. No respiratory distress. She has no wheezes. She has no rales.  Abdominal: Soft. Bowel sounds are normal. She exhibits no distension and no mass. There is no tenderness.  Musculoskeletal: Normal range of motion. She exhibits no edema and no tenderness.  Lymphadenopathy:    She has no cervical adenopathy.  Neurological: She is alert and oriented to person, place, and time. She has normal reflexes. No cranial nerve deficit. Coordination normal.  Skin: Skin is warm and dry. No rash noted. She is not diaphoretic.  Psychiatric: Mood, memory and  affect normal.       Assessment & Plan  ITP (idiopathic thrombocytopenic purpura) requires treatment intermittently. develops petechiae.   Depression with anxiety Using Prozac with decent results, is noting some increased anhedonia, no changes for now.   Allergic state Consider Cetirizine prn.   Fibromyalgia Chronic pain, add krill oil caps, increase exercise.   Preventative health care tdap given today. May need pneumonia shot at next visit. Encouraged to return for fasting labs prior to next visit.

## 2013-04-17 NOTE — Assessment & Plan Note (Signed)
tdap given today. May need pneumonia shot at next visit. Encouraged to return for fasting labs prior to next visit.

## 2013-04-22 ENCOUNTER — Emergency Department (HOSPITAL_COMMUNITY): Payer: BC Managed Care – PPO

## 2013-04-22 ENCOUNTER — Emergency Department (HOSPITAL_COMMUNITY)
Admission: EM | Admit: 2013-04-22 | Discharge: 2013-04-23 | Disposition: A | Discharge status: Home / Self Care | Payer: BC Managed Care – PPO | Attending: Emergency Medicine | Admitting: Emergency Medicine

## 2013-04-22 ENCOUNTER — Encounter (HOSPITAL_COMMUNITY): Payer: Self-pay | Admitting: Emergency Medicine

## 2013-04-22 DIAGNOSIS — F341 Dysthymic disorder: Secondary | ICD-10-CM | POA: Insufficient documentation

## 2013-04-22 DIAGNOSIS — Z9089 Acquired absence of other organs: Secondary | ICD-10-CM | POA: Insufficient documentation

## 2013-04-22 DIAGNOSIS — K8020 Calculus of gallbladder without cholecystitis without obstruction: Secondary | ICD-10-CM | POA: Insufficient documentation

## 2013-04-22 DIAGNOSIS — Z79899 Other long term (current) drug therapy: Secondary | ICD-10-CM | POA: Insufficient documentation

## 2013-04-22 DIAGNOSIS — Z8739 Personal history of other diseases of the musculoskeletal system and connective tissue: Secondary | ICD-10-CM | POA: Insufficient documentation

## 2013-04-22 DIAGNOSIS — R110 Nausea: Secondary | ICD-10-CM | POA: Insufficient documentation

## 2013-04-22 DIAGNOSIS — Z862 Personal history of diseases of the blood and blood-forming organs and certain disorders involving the immune mechanism: Secondary | ICD-10-CM | POA: Insufficient documentation

## 2013-04-22 DIAGNOSIS — Z87891 Personal history of nicotine dependence: Secondary | ICD-10-CM | POA: Insufficient documentation

## 2013-04-22 DIAGNOSIS — R1013 Epigastric pain: Secondary | ICD-10-CM | POA: Insufficient documentation

## 2013-04-22 DIAGNOSIS — Z90710 Acquired absence of both cervix and uterus: Secondary | ICD-10-CM | POA: Insufficient documentation

## 2013-04-22 LAB — BASIC METABOLIC PANEL
CO2: 24 mEq/L (ref 19–32)
Chloride: 102 mEq/L (ref 96–112)
Potassium: 3.9 mEq/L (ref 3.5–5.1)
Sodium: 138 mEq/L (ref 135–145)

## 2013-04-22 LAB — POCT I-STAT TROPONIN I
Troponin i, poc: 0 ng/mL (ref 0.00–0.08)
Troponin i, poc: 0 ng/mL (ref 0.00–0.08)

## 2013-04-22 LAB — CBC
Platelets: 72 10*3/uL — ABNORMAL LOW (ref 150–400)
RBC: 4.16 MIL/uL (ref 3.87–5.11)
WBC: 8.1 10*3/uL (ref 4.0–10.5)

## 2013-04-22 LAB — LIPASE, BLOOD: Lipase: 33 U/L (ref 11–59)

## 2013-04-22 LAB — HEPATIC FUNCTION PANEL
AST: 23 U/L (ref 0–37)
Albumin: 3.6 g/dL (ref 3.5–5.2)

## 2013-04-22 MED ORDER — ONDANSETRON HCL 4 MG/2ML IJ SOLN
4.0000 mg | INTRAMUSCULAR | Status: AC
  Administered 2013-04-22: 4 mg via INTRAVENOUS
  Filled 2013-04-22: qty 2

## 2013-04-22 MED ORDER — OXYCODONE-ACETAMINOPHEN 5-325 MG PO TABS
2.0000 | ORAL_TABLET | Freq: Once | ORAL | Status: AC
  Administered 2013-04-22: 2 via ORAL
  Filled 2013-04-22: qty 2

## 2013-04-22 MED ORDER — HYDROMORPHONE HCL PF 1 MG/ML IJ SOLN
0.5000 mg | Freq: Once | INTRAMUSCULAR | Status: AC
  Administered 2013-04-22: 0.5 mg via INTRAVENOUS
  Filled 2013-04-22: qty 1

## 2013-04-22 MED ORDER — LORAZEPAM 2 MG/ML IJ SOLN
1.0000 mg | Freq: Once | INTRAMUSCULAR | Status: AC
  Administered 2013-04-22: 1 mg via INTRAVENOUS
  Filled 2013-04-22: qty 1

## 2013-04-22 MED ORDER — OXYCODONE-ACETAMINOPHEN 5-325 MG PO TABS: 2.0000 | ORAL_TABLET | Freq: Four times a day (QID) | ORAL | Status: DC | PRN

## 2013-04-22 MED ORDER — ONDANSETRON HCL 4 MG PO TABS: 4.0000 mg | ORAL_TABLET | Freq: Four times a day (QID) | ORAL | Status: DC

## 2013-04-22 MED ORDER — HYDROMORPHONE HCL PF 1 MG/ML IJ SOLN
1.0000 mg | Freq: Once | INTRAMUSCULAR | Status: AC
  Administered 2013-04-22: 1 mg via INTRAVENOUS
  Filled 2013-04-22: qty 1

## 2013-04-22 MED ORDER — SODIUM CHLORIDE 0.9 % IV BOLUS (SEPSIS)
1000.0000 mL | Freq: Once | INTRAVENOUS | Status: AC
  Administered 2013-04-22: 1000 mL via INTRAVENOUS

## 2013-04-22 NOTE — ED Notes (Signed)
US at bedside

## 2013-04-22 NOTE — ED Notes (Signed)
Pt states that around lunch time today she started having central chest pain that goes under left breast around to left back.  Pt states she has little lightheadedness, and mild nausea but denies sweating, LOC, vomiting.

## 2013-04-22 NOTE — ED Provider Notes (Signed)
History     CSN: [id number]  Arrival date & time 04/22/13  1705   First MD Initiated Contact with Patient 04/22/13 1721      Chief Complaint  Patient presents with  . Chest Pain    (Consider location/radiation/quality/duration/timing/severity/associated sxs/prior treatment) HPI Comments: Patient is a 57 year old female with a PMH of ITP and fibromyalgia as well as a surgical hx of total splenectomy, appendectomy, and TAHBSO who presents for epigastric pain with onset this AM. Patient states that pain is sharp in nature, gradually worsening since onset, and radiating to her RUQ, L chest, and under her L breast. Patient denies aggravating or alleviating factors of symptoms. She admits to associated nausea and denies fevers, SOB, V/D, urinary symptoms, and numbness or tingling in her extremities.  Patient PCP - Dr. Abner Greenspan; Heme/Onc - Dr. Myna Hidalgo  Patient is a 57 y.o. female presenting with chest pain. The history is provided by the patient. No language interpreter was used.  Chest Pain Associated symptoms: abdominal pain (epigastric) and nausea   Associated symptoms: no dysphagia, no fever, no shortness of breath, not vomiting and no weakness     Past Medical History  Diagnosis Date  . Clotting disorder   . Lipoma 03-04-12    Rt. back about scapular ares  . 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  '95- blow normal levels, but stable.  . Fibromyalgia 03-04-12    Sporadic pain  . Depression 03-04-12    tx. meds  . PONV (postoperative nausea and vomiting)   . Allergic state 04/17/2013  . Depression with anxiety 04/17/2013  . Preventative health care 04/17/2013    Past Surgical History  Procedure Laterality Date  . Splenectomy, total  1995  . Appendectomy  1995  . Irrigation and debridement abscess  03-04-12    x2- buttocks  . Parotid gland tumor excision Left     benign  . Breast surgery  03-04-12    12'12 -left needle biopsy(benign)-Titanium needle marker remains  .  Mass excision  03/10/2012    Procedure: EXCISION MASS;  Surgeon: Adolph Pollack, MD;  Location: [facility];  Service: General;  Laterality: N/A;  Removal of back lipoma  . Abdominal hysterectomy  1995    total, endometriosis, fibroid, ovarian atrophy    Family History  Problem Relation Age of Onset  . Heart disease Maternal Grandmother   . Heart disease Maternal Grandfather   . Heart attack Maternal Grandfather 62  . Heart disease Paternal Grandmother   . Heart attack Paternal Grandmother 40  . Heart disease Paternal Grandfather   . Cancer Paternal Grandfather     chest- smoker  . Diabetes Mother     type 2- controlled by diet  . Atrial fibrillation Father   . Hyperlipidemia Sister   . Depression Sister     History  Substance Use Topics  . Smoking status: Former Smoker -- 5 years    Quit date: 02/21/1983  . Smokeless tobacco: Not on file  . Alcohol Use: 0.0 oz/week    3-4 Cans of beer per week     Comment: weekends -beer    OB History   Grav Para Term Preterm Abortions TAB SAB Ect Mult Living                  Review of Systems  Constitutional: Negative for fever.  HENT: Negative for trouble swallowing.   Respiratory: Negative for shortness of breath.   Cardiovascular: Positive for chest pain.  Gastrointestinal: Positive for nausea and abdominal pain (epigastric). Negative for vomiting and diarrhea.  Skin: Negative for color change.  Neurological: Negative for syncope, speech difficulty and weakness.  All other systems reviewed and are negative.    Allergies  Codeine  Home Medications   Current Outpatient Rx  Name  Route  Sig  Dispense  Refill  . acetaminophen (TYLENOL) 500 MG tablet   Oral   Take 1,000 mg by mouth every 6 (six) hours as needed for pain.         Marland Kitchen BIOTIN PO   Oral   Take 1 capsule by mouth daily.         . clorazepate (TRANXENE) 7.5 MG tablet   Oral   Take 1 tablet (7.5 mg total) by mouth daily at 8 pm.   30 tablet   2   .  estrogens, conjugated, (PREMARIN) 1.25 MG tablet   Oral   Take 1.25 mg by mouth daily.         Marland Kitchen FLUoxetine (PROZAC) 20 MG capsule   Oral   Take 40 mg by mouth daily with breakfast.          . traZODone (DESYREL) 50 MG tablet   Oral   Take 50 mg by mouth at bedtime.           BP 132/67  Pulse 71  Temp(Src) 98.1 F (36.7 C) (Oral)  Resp 16  SpO2 98%  Physical Exam  Nursing note and vitals reviewed. Constitutional: She is oriented to person, place, and time. She appears well-developed and well-nourished. No distress.  HENT:  Head: Normocephalic and atraumatic.  Eyes: Conjunctivae and EOM are normal. Pupils are equal, round, and reactive to light. No scleral icterus.  Neck: Normal range of motion. Neck supple.  Cardiovascular: Normal rate, regular rhythm, normal heart sounds and intact distal pulses.   Pulmonary/Chest: Effort normal and breath sounds normal. No respiratory distress. She has no wheezes. She has no rales.  Abdominal: Soft. Normal appearance and bowel sounds are normal. She exhibits no distension, no fluid wave, no ascites and no mass. There is tenderness (epigastric and RUQ) in the right upper quadrant and epigastric area. There is no rebound, no guarding, no CVA tenderness, no tenderness at McBurney's point and negative Murphy's sign.    Focal RUQ and epigastric tenderness; no peritoneal signs  Musculoskeletal: Normal range of motion. She exhibits no edema.  Lymphadenopathy:    She has no cervical adenopathy.  Neurological: She is alert and oriented to person, place, and time. She has normal strength and normal reflexes. No cranial nerve deficit or sensory deficit. She exhibits normal muscle tone. Coordination normal. GCS eye subscore is 4. GCS verbal subscore is 5. GCS motor subscore is 6.  Skin: Skin is warm and dry. No rash noted. She is not diaphoretic. No erythema. No pallor.  Psychiatric: She has a normal mood and affect. Her behavior is normal.     ED Course  Procedures (including critical care time)  Labs Reviewed  CBC - Abnormal; Notable for the following:    Platelets 72 (*)    All other components within normal limits  HEPATIC FUNCTION PANEL - Abnormal; Notable for the following:    Total Bilirubin 0.2 (*)    All other components within normal limits  BASIC METABOLIC PANEL  LIPASE, BLOOD  POCT I-STAT TROPONIN I  POCT I-STAT TROPONIN I   US Abdomen Complete  04/22/2013   *RADIOLOGY REPORT*  Clinical Data:  Right  upper quadrant abdominal pain.  COMPLETE ABDOMINAL ULTRASOUND  Comparison:  CT scan 04/16/2006.  Findings:  Gallbladder:  Large nonmobile gallstone noted measuring 2.3 cm.  No gallbladder wall thickening, pericholecystic fluid or sonographic Murphy's sign.  Common bile duct:  Normal in caliber measuring a maximum of 4.48mm.  Liver:  Normal echogenicity without intrahepatic biliary dilatation.  Scattered hypoechoic lesions correspond to lesions seen on the prior CT scan consistent with benign hepatic hemangiomas.  IVC:  Normal caliber.  Pancreas:  Sonographically unremarkable.  Spleen:  Status post lobectomy.  Right Kidney:  10.9 cm in length. Normal renal cortical thickness and echogenicity without focal lesions or hydronephrosis.  Left Kidney:  10.1 cm in length. Normal renal cortical thickness and echogenicity without focal lesions or hydronephrosis.  Abdominal aorta:  Normal caliber.  IMPRESSION:  1.  Cholelithiasis without sonographic findings for acute cholecystitis. 2.  Scattered benign appearing hepatic hemangioma is unchanged since 2007. 3.  Status post splenectomy.   Original Report Authenticated By: Rudie Meyer, M.D.   Dg Chest Port 1 View  04/22/2013   *RADIOLOGY REPORT*  Clinical Data: Chest pain, facial numbness  PORTABLE CHEST - 1 VIEW  Comparison: Prior chest x-ray and chest CT 02/20/2013  Findings: The lungs are well-aerated and free from pulmonary edema, focal airspace consolidation or pulmonary nodule.   Cardiac and mediastinal contours are within normal limits.  No pneumothorax, or pleural effusion. No acute osseous findings.  IMPRESSION:  No acute cardiopulmonary disease.   Original Report Authenticated By: Malachy Moan, M.D.     Date: 04/22/2013  Rate: 79  Rhythm: normal sinus rhythm  QRS Axis: normal  Intervals: normal  ST/T Wave abnormalities: normal  Conduction Disutrbances:none  Narrative Interpretation:   Old EKG Reviewed: unchanged from 01/30/2006   1. Cholelithiasis     MDM  Patient is a 57 year old female with a PMH of ITP and fibromyalgia as well as a surgical hx of total splenectomy, appendectomy, and TAHBSO who presents for epigastric pain with onset this AM. On physical exam there is focal tenderness to palpation in the epigastric region and medial aspect of the right upper quadrant. Low suspicion for ACS given history and physical exam findings; supported by troponin of 0.00 x 2 and unchanged EKG. chest x-ray without evidence of pneumonia, pneumothorax, or pleural effusion on my interpretation. Ordered abdominal ultrasound to further evaluate cause of patient's pain.  Abdominal ultrasound significant for cholelithiasis without evidence of acute cholecystitis. Gallstones approximately 2.3 cm and there is no gallbladder wall thickening or pericholecystic fluid. Patient's LFTs stable. Alkaline phosphatase and Total Bili WNL. Patient hemodynamically stable and pain controlled in ED. Believe pain can be adequately managed at home. She is appropriate for d/c with general surgery f/u and PCP f/u as needed. Zofran and percocet prescribed for symptom management. Indications for ED return discussed at length with this patient. Patient states comfort and understanding with this discharge plan with no unaddressed concerns. Patient work up and management discussed with Dr. Silverio Lay who is in agreement.      Antony Madura, PA-C 04/22/13 2326

## 2013-04-23 ENCOUNTER — Other Ambulatory Visit (INDEPENDENT_AMBULATORY_CARE_PROVIDER_SITE_OTHER): Payer: Self-pay | Admitting: Surgery

## 2013-04-23 ENCOUNTER — Encounter (INDEPENDENT_AMBULATORY_CARE_PROVIDER_SITE_OTHER): Payer: Self-pay | Admitting: Surgery

## 2013-04-23 ENCOUNTER — Encounter (HOSPITAL_COMMUNITY): Payer: Self-pay | Admitting: *Deleted

## 2013-04-23 ENCOUNTER — Ambulatory Visit (INDEPENDENT_AMBULATORY_CARE_PROVIDER_SITE_OTHER): Payer: BC Managed Care – PPO | Admitting: Surgery

## 2013-04-23 ENCOUNTER — Inpatient Hospital Stay (HOSPITAL_COMMUNITY)
Admission: AD | Admit: 2013-04-23 | Discharge: 2013-04-25 | DRG: 493 | Disposition: A | Discharge status: Home / Self Care | Payer: BC Managed Care – PPO | Source: Ambulatory Visit | Attending: General Surgery | Admitting: General Surgery

## 2013-04-23 VITALS — BP 130/68 | HR 92 | Resp 16 | Ht 68.0 in | Wt 185.4 lb

## 2013-04-23 DIAGNOSIS — Z79899 Other long term (current) drug therapy: Secondary | ICD-10-CM

## 2013-04-23 DIAGNOSIS — Z87891 Personal history of nicotine dependence: Secondary | ICD-10-CM

## 2013-04-23 DIAGNOSIS — K812 Acute cholecystitis with chronic cholecystitis: Secondary | ICD-10-CM

## 2013-04-23 DIAGNOSIS — IMO0001 Reserved for inherently not codable concepts without codable children: Secondary | ICD-10-CM | POA: Diagnosis present

## 2013-04-23 DIAGNOSIS — D693 Immune thrombocytopenic purpura: Secondary | ICD-10-CM

## 2013-04-23 DIAGNOSIS — K8000 Calculus of gallbladder with acute cholecystitis without obstruction: Principal | ICD-10-CM | POA: Diagnosis present

## 2013-04-23 DIAGNOSIS — F4321 Adjustment disorder with depressed mood: Secondary | ICD-10-CM | POA: Diagnosis present

## 2013-04-23 LAB — COMPREHENSIVE METABOLIC PANEL
ALT: 59 U/L — ABNORMAL HIGH (ref 0–35)
AST: 50 U/L — ABNORMAL HIGH (ref 0–37)
Albumin: 3.5 g/dL (ref 3.5–5.2)
Alkaline Phosphatase: 108 U/L (ref 39–117)
Calcium: 9.2 mg/dL (ref 8.4–10.5)
Potassium: 4.3 mEq/L (ref 3.5–5.1)
Sodium: 139 mEq/L (ref 135–145)
Total Protein: 7.2 g/dL (ref 6.0–8.3)

## 2013-04-23 LAB — CBC
Hemoglobin: 12.3 g/dL (ref 12.0–15.0)
MCH: 30.5 pg (ref 26.0–34.0)
MCHC: 33.1 g/dL (ref 30.0–36.0)
Platelets: 79 10*3/uL — ABNORMAL LOW (ref 150–400)

## 2013-04-23 MED ORDER — KCL IN DEXTROSE-NACL 40-5-0.45 MEQ/L-%-% IV SOLN
INTRAVENOUS | Status: DC
  Administered 2013-04-23 – 2013-04-25 (×4): via INTRAVENOUS
  Filled 2013-04-23 (×8): qty 1000

## 2013-04-23 MED ORDER — BISACODYL 10 MG RE SUPP: 10.0000 mg | Freq: Two times a day (BID) | RECTAL | Status: DC | PRN

## 2013-04-23 MED ORDER — ZOLPIDEM TARTRATE 5 MG PO TABS
5.0000 mg | ORAL_TABLET | Freq: Every evening | ORAL | Status: DC | PRN
Start: 2013-04-23 — End: 2013-04-24

## 2013-04-23 MED ORDER — AMPICILLIN-SULBACTAM SODIUM 3 (2-1) G IJ SOLR
3.0000 g | Freq: Four times a day (QID) | INTRAMUSCULAR | Status: DC
  Administered 2013-04-23 – 2013-04-25 (×6): 3 g via INTRAVENOUS
  Filled 2013-04-23 (×8): qty 3

## 2013-04-23 MED ORDER — ACETAMINOPHEN 500 MG PO TABS
1000.0000 mg | ORAL_TABLET | Freq: Three times a day (TID) | ORAL | Status: DC
  Administered 2013-04-23 – 2013-04-24 (×4): 1000 mg via ORAL
  Filled 2013-04-23 (×8): qty 2

## 2013-04-23 MED ORDER — MAGIC MOUTHWASH
15.0000 mL | Freq: Four times a day (QID) | ORAL | Status: DC | PRN
  Filled 2013-04-23: qty 15

## 2013-04-23 MED ORDER — DIPHENHYDRAMINE HCL 12.5 MG/5ML PO ELIX: 12.5000 mg | ORAL_SOLUTION | Freq: Four times a day (QID) | ORAL | Status: DC | PRN

## 2013-04-23 MED ORDER — LIP MEDEX EX OINT
1.0000 "application " | TOPICAL_OINTMENT | Freq: Two times a day (BID) | CUTANEOUS | Status: DC
  Administered 2013-04-24 (×2): 1 via TOPICAL
  Filled 2013-04-23: qty 7

## 2013-04-23 MED ORDER — ALUM & MAG HYDROXIDE-SIMETH 200-200-20 MG/5ML PO SUSP: 30.0000 mL | Freq: Four times a day (QID) | ORAL | Status: DC | PRN

## 2013-04-23 MED ORDER — LACTATED RINGERS IV BOLUS (SEPSIS): 1000.0000 mL | Freq: Three times a day (TID) | INTRAVENOUS | Status: DC | PRN

## 2013-04-23 MED ORDER — PHENOL 1.4 % MT LIQD: 2.0000 | OROMUCOSAL | Status: DC | PRN

## 2013-04-23 MED ORDER — HYDROMORPHONE HCL PF 1 MG/ML IJ SOLN
0.5000 mg | INTRAMUSCULAR | Status: DC | PRN
  Administered 2013-04-23 – 2013-04-24 (×3): 1 mg via INTRAVENOUS
  Filled 2013-04-23 (×2): qty 1

## 2013-04-23 MED ORDER — METOPROLOL TARTRATE 1 MG/ML IV SOLN
5.0000 mg | Freq: Four times a day (QID) | INTRAVENOUS | Status: DC | PRN
  Filled 2013-04-23: qty 5

## 2013-04-23 MED ORDER — DIPHENHYDRAMINE HCL 50 MG/ML IJ SOLN: 12.5000 mg | Freq: Four times a day (QID) | INTRAMUSCULAR | Status: DC | PRN

## 2013-04-23 MED ORDER — MAGNESIUM HYDROXIDE 400 MG/5ML PO SUSP: 30.0000 mL | Freq: Two times a day (BID) | ORAL | Status: DC | PRN

## 2013-04-23 MED ORDER — PROMETHAZINE HCL 25 MG/ML IJ SOLN
12.5000 mg | Freq: Four times a day (QID) | INTRAMUSCULAR | Status: DC | PRN
  Administered 2013-04-24: 12.5 mg via INTRAVENOUS
  Filled 2013-04-23: qty 1

## 2013-04-23 MED ORDER — ACETAMINOPHEN 325 MG PO TABS: 650.0000 mg | ORAL_TABLET | Freq: Four times a day (QID) | ORAL | Status: DC | PRN

## 2013-04-23 MED ORDER — HEPARIN SODIUM (PORCINE) 5000 UNIT/ML IJ SOLN
5000.0000 [IU] | Freq: Three times a day (TID) | INTRAMUSCULAR | Status: DC
  Filled 2013-04-23 (×8): qty 1

## 2013-04-23 MED ORDER — ONDANSETRON HCL 4 MG/2ML IJ SOLN
4.0000 mg | Freq: Four times a day (QID) | INTRAMUSCULAR | Status: DC | PRN
  Administered 2013-04-24: 4 mg via INTRAVENOUS
  Filled 2013-04-23: qty 2

## 2013-04-23 MED ORDER — LORAZEPAM 2 MG/ML IJ SOLN: 0.5000 mg | Freq: Three times a day (TID) | INTRAMUSCULAR | Status: DC | PRN

## 2013-04-23 MED ORDER — OXYCODONE HCL 5 MG PO TABS
5.0000 mg | ORAL_TABLET | ORAL | Status: DC | PRN
  Administered 2013-04-23 – 2013-04-25 (×5): 10 mg via ORAL
  Filled 2013-04-23 (×5): qty 2

## 2013-04-23 MED ORDER — ACETAMINOPHEN 650 MG RE SUPP: 650.0000 mg | Freq: Four times a day (QID) | RECTAL | Status: DC | PRN

## 2013-04-23 MED ORDER — LACTATED RINGERS IV BOLUS (SEPSIS)
1000.0000 mL | Freq: Once | INTRAVENOUS | Status: AC
  Administered 2013-04-23: 1000 mL via INTRAVENOUS

## 2013-04-23 NOTE — ED Provider Notes (Signed)
Medical screening examination/treatment/procedure(s) were conducted as a shared visit with non-physician practitioner(s) and myself.  I personally evaluated the patient during the encounter  Laura Bender is a 57 y.o. female hx of ITP, fibromyalgia here with RUQ and epigastric pain since this AM. + tenderness in RUQ, mild murphy sign and epigastric tenderness. No rebound. US showed gallstones with no acute chole. LFTs and lipase nl. I think she is likely to have biliary colic. Pain controlled with pain meds. Will d/c home with surgery f/u.    Richardean Canal, MD 04/23/13 [phone]

## 2013-04-23 NOTE — Progress Notes (Addendum)
[name], female   DOB: 1956-07-29, 57 y.o.   MRN: [id number] Patient with cholecystitis.  Being admitted from our office.  See history and physical note dictated at the same time.

## 2013-04-23 NOTE — H&P (Signed)
Laura Bender IONGEXBM  May 23, 1956 [id number]  CARE TEAM:  PCP: Danise Edge, MD  Outpatient Care Team: Patient Care Team: Bradd Canary, MD as PCP - General (Family Medicine) Florencia Reasons, MD as Consulting Physician (Gastroenterology)  Inpatient Treatment Team: @RRTREATTEAM @  This patient is a 57 y.o.female who presents today for surgical evaluation at the request of Dr. Dwain Sarna.   Reason for evaluation: Right upper quadrant pain with gallstones.  Probable cholecystitis.  Pleasant active female.  Work checked on a surgery.  An episode of upper abdominal pain a few hours after lunch.  Had a [employer] without event.  Has a history of mild reflux for which he has taken Pepcid in the past.  Pepcid did not work this time.  This felt different.  Pain became more intense in the right upper quadrant.  Began to have more bloating and nausea.  Went to the emergency room.  Evaluation revealed gallstones.  Treated with nausea and pain medicines.  A little bit better.  Was sent home.  She began to get worse.  Pain became more intense.  She called her office.  We fit her in urgently.  She is quite miserable.  She normally walks 2 miles on the treadmill most days.  It is difficult for her to even stand up now.  She feels very nauseated.  No emesis.  No sick contacts.  Normally has a bowel movement every two days.  Had a normal colonoscopy a few years ago.  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  Can get recurrent thrombocytopenia in times of stress.  Platelet count last night 70.  Does not have issues with spontaneous bleeding or bruising.  Not on any immunosuppressive medicines for this at this time.  No personal nor family history of GI/colon cancer, inflammatory bowel disease, irritable bowel syndrome, allergy such as Celiac Sprue, dietary/dairy problems, colitis, ulcers nor gastritis.  No recent sick contacts/gastroenteritis.  No travel outside the country.  No changes in  diet. Denies much in the way of heartburn or reflux the    Past Medical History  Diagnosis Date  . Clotting disorder   . Lipoma 03-04-12    Rt. back about scapular ares  . 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  '95- blow normal levels, but stable.  . Fibromyalgia 03-04-12    Sporadic pain  . Depression 03-04-12    tx. meds  . PONV (postoperative nausea and vomiting)   . Allergic state 04/17/2013  . Depression with anxiety 04/17/2013  . Preventative health care 04/17/2013    Past Surgical History  Procedure Laterality Date  . Splenectomy, total  1995  . Appendectomy  1995  . Irrigation and debridement abscess  03-04-12    x2- buttocks  . Parotid gland tumor excision Left     benign  . Breast surgery  03-04-12    12'12 -left needle biopsy(benign)-Titanium needle marker remains  . Mass excision  03/10/2012    Procedure: EXCISION MASS;  Surgeon: Adolph Pollack, MD;  Location: [facility];  Service: General;  Laterality: N/A;  Removal of back lipoma  . Abdominal hysterectomy  1995    total, endometriosis, fibroid, ovarian atrophy    History   Social History  . Marital Status: Single    Spouse Name: N/A    Number of Children: N/A  . Years of Education: N/A   Occupational History  . Not on file.   Social History Main  Topics  . Smoking status: Former Smoker -- 5 years    Quit date: 02/21/1983  . Smokeless tobacco: Not on file  . Alcohol Use: 0.0 oz/week    3-4 Cans of beer per week     Comment: weekends -beer  . Drug Use: No  . Sexually Active: No   Other Topics Concern  . Not on file   Social History Narrative   Works at [employer]    Family History  Problem Relation Age of Onset  . Heart disease Maternal Grandmother   . Heart disease Maternal Grandfather   . Heart attack Maternal Grandfather 62  . Heart disease Paternal Grandmother   . Heart attack Paternal Grandmother 24  . Heart disease Paternal Grandfather   . Cancer Paternal Grandfather      chest- smoker  . Diabetes Mother     type 2- controlled by diet  . Atrial fibrillation Father   . Hyperlipidemia Sister   . Depression Sister     Current Outpatient Prescriptions  Medication Sig Dispense Refill  . acetaminophen (TYLENOL) 500 MG tablet Take 1,000 mg by mouth every 6 (six) hours as needed for pain.      Marland Kitchen BIOTIN PO Take 1 capsule by mouth daily.      . clorazepate (TRANXENE) 7.5 MG tablet Take 1 tablet (7.5 mg total) by mouth daily at 8 pm.  30 tablet  2  . estrogens, conjugated, (PREMARIN) 1.25 MG tablet Take 1.25 mg by mouth daily.      Marland Kitchen FLUoxetine (PROZAC) 20 MG capsule Take 40 mg by mouth daily with breakfast.       . ondansetron (ZOFRAN) 4 MG tablet Take 1 tablet (4 mg total) by mouth every 6 (six) hours.  12 tablet  0  . oxyCODONE-acetaminophen (PERCOCET/ROXICET) 5-325 MG per tablet Take 2 tablets by mouth every 6 (six) hours as needed for pain.  20 tablet  0  . traZODone (DESYREL) 50 MG tablet Take 50 mg by mouth at bedtime.       No current facility-administered medications for this visit.     Allergies  Allergen Reactions  . Codeine Nausea And Vomiting    ROS: Constitutional:  No fevers, chills, sweats.  Weight stable Eyes:  No vision changes, No discharge HENT:  No sore throats, nasal drainage Lymph: No neck swelling, No bruising easily Pulmonary:  No cough, productive sputum CV: No orthopnea, PND  Patient walks 60 minutes for about 2 miles without difficulty.  No exertional chest/neck/shoulder/arm pain. GI: No personal nor family history of GI/colon cancer, inflammatory bowel disease, irritable bowel syndrome, allergy such as Celiac Sprue, dietary/dairy problems, colitis, ulcers nor gastritis.  No recent sick contacts/gastroenteritis.  No travel outside the country.  No changes in diet. Renal: No UTIs, No hematuria Genital:  No drainage, bleeding, masses Musculoskeletal: No severe joint pain.  Good ROM major joints Skin:  No sores or lesions.  No  rashes Heme/Lymph:  No easy bleeding.  No swollen lymph nodes Neuro: No focal weakness/numbness.  No seizures Psych: No suicidal ideation.  No hallucinations  BP 130/68  Pulse 92  Resp 16  Ht 5\' 8"  (1.727 m)  Wt 185 lb 6.4 oz (84.097 kg)  BMI 28.2 kg/m2  Physical Exam: General: Pt awake/alert/oriented x4 in moderate acute distress, toxic Eyes: PERRL, normal EOM. Sclera nonicteric Neuro: CN II-XII intact w/o focal sensory/motor deficits. Lymph: No head/neck/groin lymphadenopathy Psych:  No delerium/psychosis/paranoia HENT: Normocephalic, Mucus membranes moist.  No thrush Neck:  Supple, No tracheal deviation Chest: No pain.  Good respiratory excursion. CV:  Pulses intact.  Regular rhythm Abdomen: Soft, Nondistended.  ++ TTP RUQ with Murphy's sign.  No incarcerated hernias. Ext:  SCDs BLE.  No significant edema.  No cyanosis Skin: No petechiae / purpurea.  No major sores Musculoskeletal: No severe joint pain.  Good ROM major joints   Results:   Labs: Results for orders placed during the hospital encounter of 04/22/13 (from the past 48 hour(s))  CBC     Status: Abnormal   Collection Time    04/22/13  5:23 PM      Result Value Range   WBC 8.1  4.0 - 10.5 K/uL   RBC 4.16  3.87 - 5.11 MIL/uL   Hemoglobin 12.5  12.0 - 15.0 g/dL   HCT 16.1  09.6 - 04.5 %   MCV 90.9  78.0 - 100.0 fL   MCH 30.0  26.0 - 34.0 pg   MCHC 33.1  30.0 - 36.0 g/dL   RDW 40.9  81.1 - 91.4 %   Platelets 72 (*) 150 - 400 K/uL   Comment: PLATELET COUNT CONFIRMED BY SMEAR  BASIC METABOLIC PANEL     Status: None   Collection Time    04/22/13  5:23 PM      Result Value Range   Sodium 138  135 - 145 mEq/L   Potassium 3.9  3.5 - 5.1 mEq/L   Chloride 102  96 - 112 mEq/L   CO2 24  19 - 32 mEq/L   Glucose, Bld 89  70 - 99 mg/dL   BUN 17  6 - 23 mg/dL   Creatinine, Ser 7.82  0.50 - 1.10 mg/dL   Calcium 9.4  8.4 - 95.6 mg/dL   GFR calc non Af Amer >90  >90 mL/min   GFR calc Af Amer >90  >90 mL/min    Comment:            The eGFR has been calculated     using the CKD EPI equation.     This calculation has not been     validated in all clinical     situations.     eGFR's persistently     <90 mL/min signify     possible Chronic Kidney Disease.  POCT I-STAT TROPONIN I     Status: None   Collection Time    04/22/13  5:34 PM      Result Value Range   Troponin i, poc 0.00  0.00 - 0.08 ng/mL   Comment 3            Comment: Due to the release kinetics of cTnI,     a negative result within the first hours     of the onset of symptoms does not rule out     myocardial infarction with certainty.     If myocardial infarction is still suspected,     repeat the test at appropriate intervals.  HEPATIC FUNCTION PANEL     Status: Abnormal   Collection Time    04/22/13  5:43 PM      Result Value Range   Total Protein 7.5  6.0 - 8.3 g/dL   Albumin 3.6  3.5 - 5.2 g/dL   AST 23  0 - 37 U/L   ALT 16  0 - 35 U/L   Alkaline Phosphatase 109  39 - 117 U/L   Total Bilirubin 0.2 (*) 0.3 - 1.2 mg/dL   Bilirubin,  Direct <0.1  0.0 - 0.3 mg/dL   Indirect Bilirubin NOT CALCULATED  0.3 - 0.9 mg/dL  LIPASE, BLOOD     Status: None   Collection Time    04/22/13  5:43 PM      Result Value Range   Lipase 33  11 - 59 U/L  POCT I-STAT TROPONIN I     Status: None   Collection Time    04/22/13  8:57 PM      Result Value Range   Troponin i, poc 0.00  0.00 - 0.08 ng/mL   Comment 3            Comment: Due to the release kinetics of cTnI,     a negative result within the first hours     of the onset of symptoms does not rule out     myocardial infarction with certainty.     If myocardial infarction is still suspected,     repeat the test at appropriate intervals.    Imaging / Studies: US Abdomen Complete  04/22/2013   *RADIOLOGY REPORT*  Clinical Data:  Right upper quadrant abdominal pain.  COMPLETE ABDOMINAL ULTRASOUND  Comparison:  CT scan 04/16/2006.  Findings:  Gallbladder:  Large nonmobile gallstone  noted measuring 2.3 cm.  No gallbladder wall thickening, pericholecystic fluid or sonographic Murphy's sign.  Common bile duct:  Normal in caliber measuring a maximum of 4.82mm.  Liver:  Normal echogenicity without intrahepatic biliary dilatation.  Scattered hypoechoic lesions correspond to lesions seen on the prior CT scan consistent with benign hepatic hemangiomas.  IVC:  Normal caliber.  Pancreas:  Sonographically unremarkable.  Spleen:  Status post lobectomy.  Right Kidney:  10.9 cm in length. Normal renal cortical thickness and echogenicity without focal lesions or hydronephrosis.  Left Kidney:  10.1 cm in length. Normal renal cortical thickness and echogenicity without focal lesions or hydronephrosis.  Abdominal aorta:  Normal caliber.  IMPRESSION:  1.  Cholelithiasis without sonographic findings for acute cholecystitis. 2.  Scattered benign appearing hepatic hemangioma is unchanged since 2007. 3.  Status post splenectomy.   Original Report Authenticated By: Rudie Meyer, M.D.   Dg Chest Port 1 View  04/22/2013   *RADIOLOGY REPORT*  Clinical Data: Chest pain, facial numbness  PORTABLE CHEST - 1 VIEW  Comparison: Prior chest x-ray and chest CT 02/20/2013  Findings: The lungs are well-aerated and free from pulmonary edema, focal airspace consolidation or pulmonary nodule.  Cardiac and mediastinal contours are within normal limits.  No pneumothorax, or pleural effusion. No acute osseous findings.  IMPRESSION:  No acute cardiopulmonary disease.   Original Report Authenticated By: Malachy Moan, M.D.    Medications / Allergies: per chart  Antibiotics: Anti-infectives   None      Assessment  ASHAYLA SUBIA  57 y.o. female  @[facility]SURGERY @    Problem List:  Active Problems:   * No active hospital problems. *   Persistent right upper quadrant pain strongly suspicious for cholecystitis.  Plan:  Admit  IV antibiotics  IV fluid rehydration  Recheck CBC.  Consider consultation  with hematology have severe thrombocytopenia persists.  Probable cholecystectomy in the morning I discussed this with her in detail.  I discussed with my partner, Dr. Cyndia Bent, who will assume care of her admission:  The anatomy & physiology of hepatobiliary & pancreatic function was discussed.  The pathophysiology of gallbladder dysfunction was discussed.  Natural history risks without surgery was discussed.   I feel the risks of no intervention  will lead to serious problems that outweigh the operative risks; therefore, I recommended cholecystectomy to remove the pathology.  I explained laparoscopic techniques with possible need for an open approach.  Probable cholangiogram to evaluate the bilary tract was explained as well.    Risks such as bleeding, infection, abscess, leak, injury to other organs, need for further treatment, heart attack, death, and other risks were discussed.  I noted a good likelihood this will help address the problem.  Possibility that this will not correct all abdominal symptoms was explained.  Goals of post-operative recovery were discussed as well.  We will work to minimize complications.  An educational handout further explaining the pathology and treatment options was given as well.  Questions were answered.  The patient expresses understanding & wishes to proceed with surgery.  -VTE prophylaxis- SCDs, etc  -mobilize as tolerated to help recovery    Ardeth Sportsman, M.D., F.A.C.S. Gastrointestinal and Minimally Invasive Surgery [facility] [address] [place], [place] [address] [phone] Main / Paging   04/23/2013

## 2013-04-23 NOTE — Patient Instructions (Addendum)
Cholecystitis Cholecystitis is an inflammation of your gallbladder. It is usually caused by a buildup of gallstones or sludge (cholelithiasis) in your gallbladder. The gallbladder stores a fluid that helps digest fats (bile). Cholecystitis is serious and needs treatment right away.  CAUSES   Gallstones. Gallstones can block the tube that leads to your gallbladder, causing bile to build up. As bile builds up, the gallbladder becomes inflamed.  Bile duct problems, such as blockage from scarring or kinking.  Tumors. Tumors can stop bile from leaving your gallbladder correctly, causing bile to build up. As bile builds up, the gallbladder becomes inflamed. SYMPTOMS   Nausea.  Vomiting.  Abdominal pain, especially in the upper right area of your abdomen.  Abdominal tenderness or bloating.  Sweating.  Chills.  Fever.  Yellowing of the skin and the whites of the eyes (jaundice). DIAGNOSIS  Your caregiver may order blood tests to look for infection or gallbladder problems. Your caregiver may also order imaging tests, such as an ultrasound or computed tomography (CT) scan. Further tests may include a hepatobiliary iminodiacetic acid (HIDA) scan. This scan allows your caregiver to see your bile move from the liver to the gallbladder and to the small intestine. TREATMENT  A hospital stay is usually necessary to lessen the inflammation of your gallbladder. You may be required to not eat or drink (fast) for a certain amount of time. You may be given medicine to treat pain or an antibiotic medicine to treat an infection. Surgery may be needed to remove your gallbladder (cholecystectomy) once the inflammation has gone down. Surgery may be needed right away if you develop complications such as death of gallbladder tissue (gangrene) or a tear (perforation) of the gallbladder.  HOME CARE INSTRUCTIONS  Home care will depend on your treatment. In general:  If you were given antibiotics, take them as  directed. Finish them even if you start to feel better.  Only take over-the-counter or prescription medicines for pain, discomfort, or fever as directed by your caregiver.  Follow a low-fat diet until you see your caregiver again.  Keep all follow-up visits as directed by your caregiver. SEEK IMMEDIATE MEDICAL CARE IF:   Your pain is increasing and not controlled by medicines.  Your pain moves to another part of your abdomen or to your back.  You have a fever.  You have nausea and vomiting. MAKE SURE YOU:  Understand these instructions.  Will watch your condition.  Will get help right away if you are not doing well or get worse. Document Released: 11/19/2005 Document Revised: 02/11/2012 Document Reviewed: 10/05/2011 [facility] Patient Information 2014 [place], [place].  LAPAROSCOPIC SURGERY: POST OP INSTRUCTIONS  1. DIET: Follow a light bland diet the first 24 hours after arrival home, such as soup, liquids, crackers, etc.  Be sure to include lots of fluids daily.  Avoid fast food or heavy meals as your are more likely to get nauseated.  Eat a low fat the next few days after surgery.   2. Take your usually prescribed home medications unless otherwise directed. 3. PAIN CONTROL: a. Pain is best controlled by a usual combination of three different methods TOGETHER: i. Ice/Heat ii. Over the counter pain medication iii. Prescription pain medication b. Most patients will experience some swelling and bruising around the incisions.  Ice packs or heating pads (30-60 minutes up to 6 times a day) will help. Use ice for the first few days to help decrease swelling and bruising, then switch to heat to help relax tight/sore  spots and speed recovery.  Some people prefer to use ice alone, heat alone, alternating between ice & heat.  Experiment to what works for you.  Swelling and bruising can take several weeks to resolve.   c. It is helpful to take an over-the-counter pain medication regularly for  the first few weeks.  Choose one of the following that works best for you: i. Naproxen (Aleve, etc)  Two 220mg  tabs twice a day ii. Ibuprofen (Advil, etc) Three 200mg  tabs four times a day (every meal & bedtime) iii. Acetaminophen (Tylenol, etc) 500-650mg  four times a day (every meal & bedtime) d. A  prescription for pain medication (such as oxycodone, hydrocodone, etc) should be given to you upon discharge.  Take your pain medication as prescribed.  i. If you are having problems/concerns with the prescription medicine (does not control pain, nausea, vomiting, rash, itching, etc), please call us [phone] to see if we need to switch you to a different pain medicine that will work better for you and/or control your side effect better. ii. If you need a refill on your pain medication, please contact your pharmacy.  They will contact our office to request authorization. Prescriptions will not be filled after 5 pm or on week-ends. 4. Avoid getting constipated.  Between the surgery and the pain medications, it is common to experience some constipation.  Increasing fluid intake and taking a fiber supplement (such as Metamucil, Citrucel, FiberCon, MiraLax, etc) 1-2 times a day regularly will usually help prevent this problem from occurring.  A mild laxative (prune juice, Milk of Magnesia, MiraLax, etc) should be taken according to package directions if there are no bowel movements after 48 hours.   5. Watch out for diarrhea.  If you have many loose bowel movements, simplify your diet to bland foods & liquids for a few days.  Stop any stool softeners and decrease your fiber supplement.  Switching to mild anti-diarrheal medications (Kayopectate, Pepto Bismol) can help.  If this worsens or does not improve, please call us. 6. Wash / shower every day.  You may shower over the dressings as they are waterproof.  Continue to shower over incision(s) after the dressing is off. 7. Remove your waterproof bandages 5  days after surgery.  You may leave the incision open to air.  You may replace a dressing/Band-Aid to cover the incision for comfort if you wish.  8. ACTIVITIES as tolerated:   a. You may resume regular (light) daily activities beginning the next day-such as daily self-care, walking, climbing stairs-gradually increasing activities as tolerated.  If you can walk 30 minutes without difficulty, it is safe to try more intense activity such as jogging, treadmill, bicycling, low-impact aerobics, swimming, etc. b. Save the most intensive and strenuous activity for last such as sit-ups, heavy lifting, contact sports, etc  Refrain from any heavy lifting or straining until you are off narcotics for pain control.   c. DO NOT PUSH THROUGH PAIN.  Let pain be your guide: If it hurts to do something, don't do it.  Pain is your body warning you to avoid that activity for another week until the pain goes down. d. You may drive when you are no longer taking prescription pain medication, you can comfortably wear a seatbelt, and you can safely maneuver your car and apply brakes. e. Bonita Quin may have sexual intercourse when it is comfortable.  9. FOLLOW UP in our office a. Please call [facility] at [phone] to set up an appointment  to see your surgeon in the office for a follow-up appointment approximately 2-3 weeks after your surgery. b. Make sure that you call for this appointment the day you arrive home to insure a convenient appointment time. 10. IF YOU HAVE DISABILITY OR FAMILY LEAVE FORMS, BRING THEM TO THE OFFICE FOR PROCESSING.  DO NOT GIVE THEM TO YOUR DOCTOR.   WHEN TO CALL us [phone]: 1. Poor pain control 2. Reactions / problems with new medications (rash/itching, nausea, etc)  3. Fever over 101.5 F (38.5 C) 4. Inability to urinate 5. Nausea and/or vomiting 6. Worsening swelling or bruising 7. Continued bleeding from incision. 8. Increased pain, redness, or drainage from the incision   The clinic staff  is available to answer your questions during regular business hours (8:30am-5pm).  Please don't hesitate to call and ask to speak to one of our nurses for clinical concerns.   If you have a medical emergency, go to the nearest emergency room or call 911.  A surgeon from [facility] is always on call at the [facility], [place] [address], [address], [place], [place]  [address] ? MAIN: [phone] ? TOLL FREE: [phone] ?  FAX [phone] [email or web address]  Managing Pain  Pain after surgery or related to activity is often due to strain/injury to muscle, tendon, nerves and/or incisions.  This pain is usually short-term and will improve in a few months.   Many people find it helpful to do the following things TOGETHER to help speed the process of healing and to get back to regular activity more quickly:  1. Avoid heavy physical activity a.  no lifting greater than 20 pounds b. Do not "push through" the pain.  Listen to your body and avoid positions and maneuvers than reproduce the pain c. Walking is okay as tolerated, but go slowly and stop when getting sore.  d. Remember: If it hurts to do it, then don't do it! 2. Take Anti-inflammatory medication  a. Take with food/snack around the clock for 1-2 weeks i. This helps the muscle and nerve tissues become less irritable and calm down faster b. Choose ONE of the following over-the-counter medications: i. Naproxen 220mg  tabs (ex. Aleve) 1-2 pills twice a day  ii. Ibuprofen 200mg  tabs (ex. Advil, Motrin) 3-4 pills with every meal and just before bedtime iii. Acetaminophen 500mg  tabs (Tylenol) 1-2 pills with every meal and just before bedtime 3. Use a Heating pad or Ice/Cold Pack a. 4-6 times a day b. May use warm bath/hottub  or showers 4. Try Gentle Massage and/or Stretching  a. at the area of pain many times a day b. stop if you feel pain - do not overdo it  Try these steps  together to help you body heal faster and avoid making things get worse.  Doing just one of these things may not be enough.    If you are not getting better after two weeks or are noticing you are getting worse, contact our office for further advice; we may need to re-evaluate you & see what other things we can do to help.

## 2013-04-24 ENCOUNTER — Inpatient Hospital Stay (HOSPITAL_COMMUNITY): Payer: BC Managed Care – PPO

## 2013-04-24 ENCOUNTER — Ambulatory Visit: Admit: 2013-04-24 | Payer: Self-pay | Admitting: Surgery

## 2013-04-24 ENCOUNTER — Encounter (INDEPENDENT_AMBULATORY_CARE_PROVIDER_SITE_OTHER): Payer: BC Managed Care – PPO | Admitting: Surgery

## 2013-04-24 ENCOUNTER — Encounter (HOSPITAL_COMMUNITY): Payer: Self-pay | Admitting: Anesthesiology

## 2013-04-24 ENCOUNTER — Encounter (HOSPITAL_COMMUNITY)
Admission: AD | Disposition: A | Discharge status: Home / Self Care | Payer: Self-pay | Source: Ambulatory Visit | Attending: General Surgery

## 2013-04-24 ENCOUNTER — Inpatient Hospital Stay (HOSPITAL_COMMUNITY): Payer: BC Managed Care – PPO | Admitting: Anesthesiology

## 2013-04-24 DIAGNOSIS — K810 Acute cholecystitis: Secondary | ICD-10-CM

## 2013-04-24 DIAGNOSIS — K8000 Calculus of gallbladder with acute cholecystitis without obstruction: Secondary | ICD-10-CM

## 2013-04-24 DIAGNOSIS — D693 Immune thrombocytopenic purpura: Secondary | ICD-10-CM

## 2013-04-24 HISTORY — PX: CHOLECYSTECTOMY: SHX55

## 2013-04-24 LAB — BASIC METABOLIC PANEL
BUN: 7 mg/dL (ref 6–23)
CO2: 32 mEq/L (ref 19–32)
Calcium: 8.7 mg/dL (ref 8.4–10.5)
Chloride: 105 mEq/L (ref 96–112)
Creatinine, Ser: 0.68 mg/dL (ref 0.50–1.10)
Glucose, Bld: 116 mg/dL — ABNORMAL HIGH (ref 70–99)

## 2013-04-24 LAB — CBC
HCT: 35.6 % — ABNORMAL LOW (ref 36.0–46.0)
MCH: 31.3 pg (ref 26.0–34.0)
MCHC: 33.4 g/dL (ref 30.0–36.0)
MCV: 93.7 fL (ref 78.0–100.0)
RDW: 13.9 % (ref 11.5–15.5)
WBC: 8.8 10*3/uL (ref 4.0–10.5)

## 2013-04-24 LAB — TYPE AND SCREEN: ABO/RH(D): O POS

## 2013-04-24 SURGERY — LAPAROSCOPIC CHOLECYSTECTOMY WITH INTRAOPERATIVE CHOLANGIOGRAM
Anesthesia: General | Wound class: Clean Contaminated

## 2013-04-24 MED ORDER — IOHEXOL 300 MG/ML  SOLN
INTRAMUSCULAR | Status: DC | PRN
  Administered 2013-04-24: 50 mL

## 2013-04-24 MED ORDER — PROPOFOL INFUSION 10 MG/ML OPTIME
INTRAVENOUS | Status: DC | PRN
  Administered 2013-04-24: 160 ug/kg/min via INTRAVENOUS

## 2013-04-24 MED ORDER — ROCURONIUM BROMIDE 100 MG/10ML IV SOLN
INTRAVENOUS | Status: DC | PRN
  Administered 2013-04-24: 10 mg via INTRAVENOUS
  Administered 2013-04-24: 35 mg via INTRAVENOUS
  Administered 2013-04-24: 5 mg via INTRAVENOUS
  Administered 2013-04-24: 10 mg via INTRAVENOUS

## 2013-04-24 MED ORDER — SCOPOLAMINE 1 MG/3DAYS TD PT72
MEDICATED_PATCH | TRANSDERMAL | Status: DC | PRN
  Administered 2013-04-24: 1 via TRANSDERMAL

## 2013-04-24 MED ORDER — BUPIVACAINE HCL (PF) 0.25 % IJ SOLN
INTRAMUSCULAR | Status: DC | PRN
  Administered 2013-04-24: 20 mL

## 2013-04-24 MED ORDER — LABETALOL HCL 5 MG/ML IV SOLN
INTRAVENOUS | Status: DC | PRN
  Administered 2013-04-24: 5 mg via INTRAVENOUS

## 2013-04-24 MED ORDER — GLYCOPYRROLATE 0.2 MG/ML IJ SOLN
INTRAMUSCULAR | Status: DC | PRN
  Administered 2013-04-24: 2.5 mg via INTRAVENOUS

## 2013-04-24 MED ORDER — PROMETHAZINE HCL 25 MG/ML IJ SOLN: 6.2500 mg | INTRAMUSCULAR | Status: DC | PRN

## 2013-04-24 MED ORDER — METOCLOPRAMIDE HCL 5 MG/ML IJ SOLN
INTRAMUSCULAR | Status: DC | PRN
  Administered 2013-04-24: 10 mg via INTRAVENOUS

## 2013-04-24 MED ORDER — HEMOSTATIC AGENTS (NO CHARGE) OPTIME
TOPICAL | Status: DC | PRN
  Administered 2013-04-24: 1 via TOPICAL

## 2013-04-24 MED ORDER — SUCCINYLCHOLINE CHLORIDE 20 MG/ML IJ SOLN
INTRAMUSCULAR | Status: DC | PRN
  Administered 2013-04-24: 100 mg via INTRAVENOUS

## 2013-04-24 MED ORDER — NEOSTIGMINE METHYLSULFATE 1 MG/ML IJ SOLN
INTRAMUSCULAR | Status: DC | PRN
  Administered 2013-04-24: 3.5 mg via INTRAVENOUS

## 2013-04-24 MED ORDER — PROMETHAZINE HCL 25 MG/ML IJ SOLN
INTRAMUSCULAR | Status: DC | PRN
  Administered 2013-04-24: .625 mg via INTRAVENOUS

## 2013-04-24 MED ORDER — ONDANSETRON HCL 4 MG/2ML IJ SOLN
INTRAMUSCULAR | Status: DC | PRN
  Administered 2013-04-24: 4 mg via INTRAVENOUS

## 2013-04-24 MED ORDER — LACTATED RINGERS IV SOLN
INTRAVENOUS | Status: DC | PRN
  Administered 2013-04-24 (×2): via INTRAVENOUS

## 2013-04-24 MED ORDER — LACTATED RINGERS IV SOLN
INTRAVENOUS | Status: DC | PRN
  Administered 2013-04-24 (×2): 1000 mL

## 2013-04-24 MED ORDER — KETOROLAC TROMETHAMINE 30 MG/ML IJ SOLN: 15.0000 mg | Freq: Once | INTRAMUSCULAR | Status: DC | PRN

## 2013-04-24 MED ORDER — 0.9 % SODIUM CHLORIDE (POUR BTL) OPTIME
TOPICAL | Status: DC | PRN
  Administered 2013-04-24: 1000 mL

## 2013-04-24 MED ORDER — FENTANYL CITRATE 0.05 MG/ML IJ SOLN
25.0000 ug | INTRAMUSCULAR | Status: DC | PRN
  Administered 2013-04-24: 50 ug via INTRAVENOUS
  Administered 2013-04-24 (×2): 25 ug via INTRAVENOUS

## 2013-04-24 MED ORDER — DEXAMETHASONE SODIUM PHOSPHATE 10 MG/ML IJ SOLN
INTRAMUSCULAR | Status: DC | PRN
  Administered 2013-04-24: 8 mg via INTRAVENOUS

## 2013-04-24 MED ORDER — SUFENTANIL CITRATE 50 MCG/ML IV SOLN
INTRAVENOUS | Status: DC | PRN
  Administered 2013-04-24: 10 ug via INTRAVENOUS
  Administered 2013-04-24: 5 ug via INTRAVENOUS
  Administered 2013-04-24: 10 ug via INTRAVENOUS
  Administered 2013-04-24: 15 ug via INTRAVENOUS
  Administered 2013-04-24 (×2): 10 ug via INTRAVENOUS

## 2013-04-24 SURGICAL SUPPLY — 43 items
ADH SKN CLS APL DERMABOND .7 (GAUZE/BANDAGES/DRESSINGS) ×2
APPLIER CLIP ROT 10 11.4 M/L (STAPLE) ×2
APR CLP MED LRG 11.4X10 (STAPLE) ×1
BAG SPEC RTRVL LRG 6X4 10 (ENDOMECHANICALS) ×1
CANISTER SUCTION 2500CC (MISCELLANEOUS) ×2 IMPLANT
CATH ROBINSON RED A/P 16FR (CATHETERS) ×1 IMPLANT
CHLORAPREP W/TINT 10.5 ML (MISCELLANEOUS) ×2 IMPLANT
CLIP APPLIE ROT 10 11.4 M/L (STAPLE) ×1 IMPLANT
CLOTH BEACON ORANGE TIMEOUT ST (SAFETY) ×2 IMPLANT
COVER MAYO STAND STRL (DRAPES) IMPLANT
DECANTER SPIKE VIAL GLASS SM (MISCELLANEOUS) ×2 IMPLANT
DERMABOND ADVANCED (GAUZE/BANDAGES/DRESSINGS) ×2
DERMABOND ADVANCED .7 DNX12 (GAUZE/BANDAGES/DRESSINGS) IMPLANT
DRAPE C-ARM 42X72 X-RAY (DRAPES) IMPLANT
DRAPE LAPAROSCOPIC ABDOMINAL (DRAPES) ×2 IMPLANT
ELECT REM PT RETURN 9FT ADLT (ELECTROSURGICAL) ×2
ELECTRODE REM PT RTRN 9FT ADLT (ELECTROSURGICAL) ×1 IMPLANT
FILTER SMOKE EVAC LAPAROSHD (FILTER) ×2 IMPLANT
GLOVE BIOGEL PI IND STRL 7.0 (GLOVE) ×1 IMPLANT
GLOVE BIOGEL PI INDICATOR 7.0 (GLOVE) ×1
GLOVE EUDERMIC 7 POWDERFREE (GLOVE) ×2 IMPLANT
GOWN STRL NON-REIN LRG LVL3 (GOWN DISPOSABLE) ×2 IMPLANT
GOWN STRL REIN XL XLG (GOWN DISPOSABLE) ×4 IMPLANT
HEMOSTAT SURGICEL 2X14 (HEMOSTASIS) ×1 IMPLANT
HEMOSTAT SURGICEL 4X8 (HEMOSTASIS) IMPLANT
IV SET EXT 30 76VOL 4 MALE LL (IV SETS) IMPLANT
KIT BASIN OR (CUSTOM PROCEDURE TRAY) ×2 IMPLANT
NS IRRIG 1000ML POUR BTL (IV SOLUTION) ×2 IMPLANT
POUCH SPECIMEN RETRIEVAL 10MM (ENDOMECHANICALS) ×1 IMPLANT
SET CHOLANGIOGRAPH MIX (MISCELLANEOUS) ×2 IMPLANT
SET IRRIG TUBING LAPAROSCOPIC (IRRIGATION / IRRIGATOR) ×2 IMPLANT
SLEEVE Z-THREAD 5X100MM (TROCAR) ×2 IMPLANT
SOLUTION ANTI FOG 6CC (MISCELLANEOUS) ×2 IMPLANT
STOPCOCK K 69 2C6206 (IV SETS) IMPLANT
STRIP CLOSURE SKIN 1/4X3 (GAUZE/BANDAGES/DRESSINGS) ×4 IMPLANT
SUT MNCRL AB 4-0 PS2 18 (SUTURE) ×2 IMPLANT
TOWEL OR 17X26 10 PK STRL BLUE (TOWEL DISPOSABLE) ×2 IMPLANT
TRAY LAP CHOLE (CUSTOM PROCEDURE TRAY) ×2 IMPLANT
TROCAR XCEL BLUNT TIP 100MML (ENDOMECHANICALS) ×2 IMPLANT
TROCAR XCEL NON-BLD 11X100MML (ENDOMECHANICALS) ×1 IMPLANT
TROCAR Z-THREAD FIOS 11X100 BL (TROCAR) ×2 IMPLANT
TROCAR Z-THREAD FIOS 5X100MM (TROCAR) ×2 IMPLANT
TUBING INSUFFLATION 10FT LAP (TUBING) ×2 IMPLANT

## 2013-04-24 NOTE — Progress Notes (Signed)
Acute cholecystitis with chronic cholecystitis  Assessment: Biliary colic developing acute cholecystitis ITP, stable  Plan: Will proceed to lap chole later today. Patient wishes to proceed and voices understanding of procedure risks etc from her discussions yesterday with Dr Michaell Cowing. She has a long upper midline incision which could make laparoscopic approach more difficult and I discussed that as well   Subjective: Still with RUQ pain, a little worse than yesterday  Objective: Vital signs in last 24 hours: Temp:  [97.9 F (36.6 C)-98.5 F (36.9 C)] 98.5 F (36.9 C) (05/23 0500) Pulse Rate:  [60-92] 63 (05/23 0500) Resp:  [16-18] 18 (05/23 0500) BP: (122-142)/(68-82) 122/77 mmHg (05/23 0500) SpO2:  [96 %-100 %] 96 % (05/23 0500) Weight:  [185 lb 6.4 oz (84.097 kg)] 185 lb 6.4 oz (84.097 kg) (05/22 1731) Last BM Date: 04/23/13  Intake/Output from previous day: 05/22 0701 - 05/23 0700 In: 1370.8 [I.V.:1370.8] Out: 3 [Urine:3]  General appearance: alert, cooperative and mild distress Resp: clear to auscultation bilaterally GI: not distended, soft, but very tender right subcostal area, no rebound  Lab Results:  Results for orders placed during the hospital encounter of 04/23/13 (from the past 24 hour(s))  CBC     Status: Abnormal   Collection Time    04/23/13  5:06 PM      Result Value Range   WBC 13.1 (*) 4.0 - 10.5 K/uL   RBC 4.03  3.87 - 5.11 MIL/uL   Hemoglobin 12.3  12.0 - 15.0 g/dL   HCT 28.4  13.2 - 44.0 %   MCV 92.3  78.0 - 100.0 fL   MCH 30.5  26.0 - 34.0 pg   MCHC 33.1  30.0 - 36.0 g/dL   RDW 10.2  72.5 - 36.6 %   Platelets 79 (*) 150 - 400 K/uL  COMPREHENSIVE METABOLIC PANEL     Status: Abnormal   Collection Time    04/23/13  5:06 PM      Result Value Range   Sodium 139  135 - 145 mEq/L   Potassium 4.3  3.5 - 5.1 mEq/L   Chloride 104  96 - 112 mEq/L   CO2 30  19 - 32 mEq/L   Glucose, Bld 108 (*) 70 - 99 mg/dL   BUN 10  6 - 23 mg/dL   Creatinine, Ser  4.40  0.50 - 1.10 mg/dL   Calcium 9.2  8.4 - 34.7 mg/dL   Total Protein 7.2  6.0 - 8.3 g/dL   Albumin 3.5  3.5 - 5.2 g/dL   AST 50 (*) 0 - 37 U/L   ALT 59 (*) 0 - 35 U/L   Alkaline Phosphatase 108  39 - 117 U/L   Total Bilirubin 0.3  0.3 - 1.2 mg/dL   GFR calc non Af Amer >90  >90 mL/min   GFR calc Af Amer >90  >90 mL/min  SURGICAL PCR SCREEN     Status: None   Collection Time    04/23/13  7:25 PM      Result Value Range   MRSA, PCR NEGATIVE  NEGATIVE   Staphylococcus aureus NEGATIVE  NEGATIVE  BASIC METABOLIC PANEL     Status: Abnormal   Collection Time    04/24/13  4:25 AM      Result Value Range   Sodium 139  135 - 145 mEq/L   Potassium 4.3  3.5 - 5.1 mEq/L   Chloride 105  96 - 112 mEq/L   CO2 32  19 -  32 mEq/L   Glucose, Bld 116 (*) 70 - 99 mg/dL   BUN 7  6 - 23 mg/dL   Creatinine, Ser 0.86  0.50 - 1.10 mg/dL   Calcium 8.7  8.4 - 57.8 mg/dL   GFR calc non Af Amer >90  >90 mL/min   GFR calc Af Amer >90  >90 mL/min  CBC     Status: Abnormal   Collection Time    04/24/13  4:25 AM      Result Value Range   WBC 8.8  4.0 - 10.5 K/uL   RBC 3.80 (*) 3.87 - 5.11 MIL/uL   Hemoglobin 11.9 (*) 12.0 - 15.0 g/dL   HCT 46.9 (*) 62.9 - 52.8 %   MCV 93.7  78.0 - 100.0 fL   MCH 31.3  26.0 - 34.0 pg   MCHC 33.4  30.0 - 36.0 g/dL   RDW 41.3  24.4 - 01.0 %   Platelets 76 (*) 150 - 400 K/uL     Studies/Results Radiology     MEDS, Scheduled . acetaminophen  1,000 mg Oral TID  . ampicillin-sulbactam (UNASYN) IV  3 g Intravenous Q6H  . heparin  5,000 Units Subcutaneous Q8H  . lip balm  1 application Topical BID       LOS: 1 day    Currie Paris, MD, [facility], [place] [phone]   04/24/2013 7:46 AM

## 2013-04-24 NOTE — Consult Note (Signed)
Ms Brickley is well-known to me. She is a very nice 57 year old white female with chronic immune thrombocytopenia. I've been seeing her now probably for close to 17 or 18 years. 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 Typically, her platelet counts ranged around 20-50,000. She recently had a respiratory illness and her platelet count dropped down to around 10,000. They have rebounded nicely.  Last him that we actually treated the thrombocytopenia was back in 2007 when she got Rituxan.  She is now admitted with acute cholecystitis. Her platelet count actually was 72,000.  She has had no bleeding. She is due for surgery today. She will have a laparoscopic procedure.  She has not been on any aspirin. She has had prior surgery without any issues with bleeding. She did have a lipoma removed last year. Her platelet count was 46,000. She did receive some platelets preprocedure.  Even with profound thrombocytopenia, she has not had any bleeding issues. Her platelets are very functional.  Her past medical history is pretty much unremarkable. There is some fibromyalgia. She has some situational depression.  Her allergies are to codeine.  Her admission medications are Tranxene 7.5 mg by mouth every morning Premarin 1.25 mg by mouth daily Prozac 40 mg by mouth daily Desyrel 50 mg by mouth Q. at bedtime  Social history is negative for tobacco use. There is rare alcohol use.  Family history is non-contributory.  On her physical exam this is a well-developed well-nourished white female in no obvious distress. Vital signs were temperature of 98.5 pulse 63 response rate 18 blood pressure 122/77. Head and neck exam shows no ocular oral lesion. There are no palpable cervical or supraclavicular lymph nodes. Lungs are clear bilaterally. Cardiac exam regular rate and rhythm with no murmurs rubs or bruits. Abdominal exam is soft. There is some tenderness in the right upper quadrant. There may be some slight  "bloating". Bowel sounds are decreased. There is no obvious fluid wave. She's well-healed splenectomy scar. Extremities shows no clubbing cyanosis or edema. Neurological exam shows no focal neurological deficits.  Laboratory studies shows a white cell count 8.8 hemoglobin 12 hematocrit 35.6 platelet count 76,000.  Ms. Runnels is a 57 year old white female with chronic immune thrombocytopenia. Her platelet count actually is doing quite well for her. I do not anticipate any problems with bleeding with her surgery. I do not feel that she needs to platelet transfusions prior to surgery. I feel her risk of bleeding is minimal given her platelet count. Typically with immune thrombocytopenia, the platelets are very functional as they are very young platelets that tend to be "sticky".  I will be more than happy to follow along and help out in any way possible. Again, I feel that she will do well with surgery and that her risk of bleeding should not be any greater than normal.  Pete E.  Joshua 1:9

## 2013-04-24 NOTE — Anesthesia Postprocedure Evaluation (Signed)
Anesthesia Post Note  Patient: Laura Bender  Procedure(s) Performed: Procedure(s) (LRB): LAPAROSCOPIC CHOLECYSTECTOMY WITH INTRAOPERATIVE CHOLANGIOGRAM (N/A)  Anesthesia type: General  Patient location: PACU  Post pain: Pain level controlled  Post assessment: Post-op Vital signs reviewed  Last Vitals: BP 134/58  Pulse 67  Temp(Src) 36.9 C (Oral)  Resp 15  Ht 5\' 8"  (1.727 m)  Wt 185 lb 6.4 oz (84.097 kg)  BMI 28.2 kg/m2  SpO2 100%  Post vital signs: Reviewed  Level of consciousness: sedated  Complications: No apparent anesthesia complications

## 2013-04-24 NOTE — Transfer of Care (Signed)
Immediate Anesthesia Transfer of Care Note  Patient: Laura Bender  Procedure(s) Performed: Procedure(s): LAPAROSCOPIC CHOLECYSTECTOMY WITH INTRAOPERATIVE CHOLANGIOGRAM (N/A)  Patient Location: PACU  Anesthesia Type:General  Level of Consciousness: awake, oriented, sedated, patient cooperative and responds to stimulation  Airway & Oxygen Therapy: Patient Spontanous Breathing and Patient connected to face mask oxygen  Post-op Assessment: Report given to PACU RN, Post -op Vital signs reviewed and stable and Patient moving all extremities X 4  Post vital signs: Reviewed, stable  Complications: No apparent anesthesia complications

## 2013-04-24 NOTE — Op Note (Signed)
Laura Bender [facility] 18-Aug-1956 [id number] 04/23/2013  Preoperative diagnosis: acute calculus cholecystitis  Postoperative diagnosis: same  Procedure: laparoscopic cholecystectomy with intraoperative cholangiogram  Surgeon: Currie Paris, MD, FACS  Assistant surgeon: Dr. Viviann Spare gross   Anesthesia: General  Clinical History and Indications: This patient was admitted urgently yesterday with signs and symptoms of acute cholecystitis and gallstones.  Description of procedure: The patient was seen in the preoperative area. I reviewed the plans for the procedure with her as well as the risks and complications. She had no further questions and wished to proceed.  The patient was taken to the operating room. After satisfactory general endotracheal anesthesia had been obtained the abdomen was prepped and draped. A time out was done.  0.25% plain Marcaine was used at all incisions.she has a long midline incision from her prior splenectomy. Therefore I used a 5 mm Optiview trocar in the right upper quadrant to gain entry to the abdominal cavity under direct vision. The camera was placed and there were no gross abnormalities. There were some midline adhesions, but the area of the umbilicus was free of adhesions. I made an umbilical incision I was able to inner the abdominal cavity under direct vision. A pursestring was placed and the [facility] cannula introduced. I placed a camera here and put a 1011 port in the epigastrium and a second 5 mm port in the right upper quadrant.  The gallbladder was tense distended and acutely inflamed. It was aspirated to decompress it. Dissection was then carried out in the area of the cystic duct. This was very tedious scissor a lot of adhesions and edema. Eventually was able to free a segment of what appeared to be the cystic duct and another segment of the cystic artery. I then opened the peritoneum a little further up on the gallbladder to confirm the anatomy. Once  that was done I clipped and divided the cystic artery. A clip was placed on the cystic duct. It was opened.   An intraoperative cholangiogram was then performed. A Cook catheter was introduced percutaneously and placed in the cystic duct. The cholangiogram showed good filling of the common duct and hepatic radicals and free flow into the duodenum. No abnormalities were noted.  The catheter was removed and 3 clips placed on the stay side of the cystic duct. The duct was then divided.  . The gallbladder was then removed from below to above the coagulation current of the cautery.smaller vessels were clipped and divided as they were encountered. It was then placed in a bag to be retrieved later.  The abdomen was irrigated and a check for hemostasis along the bed of the gallbladder made. Once everything appeared to be dry we were able to move the camera to the epigastric port and removed the gallbladder through the umbilical port.  The abdomen was reinsufflated and a final check for hemostasis made. There is no evidence of bleeding or bile leakage. The lateral ports were removed under direct vision and there was no bleeding. The umbilical site was closed with a pursestring, watching with the camera in the epigastric port. The abdomen was then deflated through the epigastric port and that was removed. Skin was closed with 4-0 Monocryl subcuticular and Dermabond.  The patient tolerated the procedure well. There were no operative complications. EBL was minimal. All counts were correct.  Currie Paris, MD, FACS 04/24/2013 3:10 PM

## 2013-04-24 NOTE — Anesthesia Preprocedure Evaluation (Signed)
Anesthesia Evaluation  Patient identified by MRN, date of birth, ID band Patient awake    Reviewed: Allergy & Precautions, H&P , NPO status , Patient's Chart, lab work & pertinent test results  Airway Mallampati: II TM Distance: >3 FB Neck ROM: Full    Dental no notable dental hx.    Pulmonary neg pulmonary ROS,  breath sounds clear to auscultation  Pulmonary exam normal       Cardiovascular negative cardio ROS  Rhythm:Regular Rate:Normal     Neuro/Psych negative neurological ROS  negative psych ROS   GI/Hepatic negative GI ROS, Neg liver ROS,   Endo/Other  negative endocrine ROS  Renal/GU negative Renal ROS  negative genitourinary   Musculoskeletal  (+) Fibromyalgia -  Abdominal   Peds negative pediatric ROS (+)  Hematology  (+) Blood dyscrasia, anemia , ITP (idiopathic thrombocytopenic purpura) 03-04-12 Dx.  '95- blow normal levels, but stable.    Anesthesia Other Findings   Reproductive/Obstetrics negative OB ROS                           Anesthesia Physical Anesthesia Plan  ASA: III  Anesthesia Plan: General   Post-op Pain Management:    Induction: Intravenous  Airway Management Planned: Oral ETT  Additional Equipment:   Intra-op Plan:   Post-operative Plan:   Informed Consent: I have reviewed the patients History and Physical, chart, labs and discussed the procedure including the risks, benefits and alternatives for the proposed anesthesia with the patient or authorized representative who has indicated his/her understanding and acceptance.   Dental advisory given  Plan Discussed with: CRNA and Surgeon  Anesthesia Plan Comments:         Anesthesia Quick Evaluation

## 2013-04-25 MED ORDER — OXYCODONE HCL 5 MG PO TABS: 5.0000 mg | ORAL_TABLET | ORAL | Status: DC | PRN

## 2013-04-25 NOTE — Progress Notes (Signed)
Pain well controlled with oral pain meds. Ate fat modified regular breakfast and tolerated well. Voices desire for discharge. Discharge instruction discussed . Able to voice home care instructions.

## 2013-04-25 NOTE — Progress Notes (Signed)
1 Day Post-Op   Assessment: s/p Procedure(s): LAPAROSCOPIC CHOLECYSTECTOMY WITH INTRAOPERATIVE CHOLANGIOGRAM Patient Active Problem List   Diagnosis Date Noted  . Acute cholecystitis with chronic cholecystitis 04/23/2013    Priority: High  . Allergic state 04/17/2013  . Fibromyalgia 04/17/2013  . Depression with anxiety 04/17/2013  . Preventative health care 04/17/2013  . Lipoma of back 02/07/2012  . ITP (idiopathic thrombocytopenic purpura) s/p splenectomy 1995 11/23/2011    Doing well post lap chole for acute cholecystitis  Plan: Advance diet Discharge If diet tolerated this am and pain well controlled  Subjective: Feels much better than pre-op and hoping to go home later today  Objective: Vital signs in last 24 hours: Temp:  [97.3 F (36.3 C)-99.1 F (37.3 C)] 98.9 F (37.2 C) (05/24 0542) Pulse Rate:  [58-74] 60 (05/24 0542) Resp:  [15-16] 16 (05/24 0542) BP: (109-148)/(58-76) 115/72 mmHg (05/24 0542) SpO2:  [94 %-100 %] 98 % (05/24 0542)   Intake/Output from previous day: 05/23 0701 - 05/24 0700 In: 4815.8 [P.O.:720; I.V.:3895.8; IV Piggyback:200] Out: 3500 [Urine:3500]  General appearance: alert, cooperative and no distress Resp: clear to auscultation bilaterally GI: Soft, not distended, mild incisional tenderness, BS+  Incision: healing well  Lab Results:   Recent Labs  04/23/13 1706 04/24/13 0425  WBC 13.1* 8.8  HGB 12.3 11.9*  HCT 37.2 35.6*  PLT 79* 76*   BMET  Recent Labs  04/23/13 1706 04/24/13 0425  NA 139 139  K 4.3 4.3  CL 104 105  CO2 30 32  GLUCOSE 108* 116*  BUN 10 7  CREATININE 0.72 0.68  CALCIUM 9.2 8.7    MEDS, Scheduled . acetaminophen  1,000 mg Oral TID  . ampicillin-sulbactam (UNASYN) IV  3 g Intravenous Q6H  . heparin  5,000 Units Subcutaneous Q8H  . lip balm  1 application Topical BID    Studies/Results: Dg Cholangiogram Operative  04/24/2013   *RADIOLOGY REPORT*  Clinical data:  Cholelithiasis.   Laparoscopic cholecystectomy.  INTRAOPERATIVE CHOLANGIOGRAM 04/24/2013.  Comparison: Abdominal ultrasound 04/22/2013.  Findings:  2 series of cholangiographic images from the C-arm fluoroscopic device were submitted for interpretation post- operatively.  The cannula is present in the cystic duct remnant with excellent opacification of the common bile duct, common hepatic duct, and proximal intrahepatic ducts.  No filling defects to suggest retained bile duct stones.  Excellent antegrade flow into the duodenum.  No extravasation.  The pancreatic duct in the head of the pancreas fills and is normal in appearance.  The radiologic technologist documented 10 seconds of fluoroscopy time.  Please correlate with findings at real time fluoroscopy.  IMPRESSION: Normal intraoperative cholangiogram.  These images were submitted for radiologic interpretation only. Please see the procedural report for the amount of contrast and the fluoroscopy time utilized.   Original Report Authenticated By: Hulan Saas, M.D.      LOS: 2 days     Currie Paris, MD, [facility] Surgery, [place] [phone]   04/25/2013 7:18 AM

## 2013-04-28 ENCOUNTER — Encounter (HOSPITAL_COMMUNITY): Payer: Self-pay | Admitting: Surgery

## 2013-04-29 NOTE — Discharge Summary (Signed)
[name] [id number] 57 y.o. October 06, 1956  Admission  Date: 04/23/2013  Discharge date and time: 04/25/2013  9:55 AM  Admitting Physician: Currie Paris  Discharge Physician: Currie Paris  Admission Diagnoses: cholecystitis cholecystitis  Discharge Diagnoses: Acute calsulous cholecystitis  Operations: Procedure(s): LAPAROSCOPIC CHOLECYSTECTOMY WITH INTRAOPERATIVE CHOLANGIOGRAM  Discharged Condition: good  Hospital Course: The patient was admitted with S/S of ascute cholecystitis, taken to surgery the next AM for lap chole. Did well and able to be discharged the next day  Consults: None  Significant Diagnostic Studies: Path: Diagnosis Gallbladder - ACUTE CHOLECYSTITIS AND CHOLELITHIASIS. Abigail Miyamoto MD Pathologist, Electronic Signature (Case signed 04/28/2013)   Disposition: Home

## 2013-05-15 ENCOUNTER — Ambulatory Visit (INDEPENDENT_AMBULATORY_CARE_PROVIDER_SITE_OTHER): Payer: BC Managed Care – PPO | Admitting: Surgery

## 2013-05-15 ENCOUNTER — Encounter (INDEPENDENT_AMBULATORY_CARE_PROVIDER_SITE_OTHER): Payer: Self-pay | Admitting: Surgery

## 2013-05-15 VITALS — BP 128/72 | HR 64 | Temp 97.6°F | Resp 15 | Ht 68.0 in | Wt 182.8 lb

## 2013-05-15 DIAGNOSIS — Z09 Encounter for follow-up examination after completed treatment for conditions other than malignant neoplasm: Secondary | ICD-10-CM

## 2013-05-15 NOTE — Progress Notes (Signed)
NAME: Laura Bender       DOB: July 19, 1956           DATE: 05/15/2013       YNW:[id number]   CC:  Chief Complaint  Patient presents with  . Routine Post Op    1st po gb     Impression:  The patient appears to be doing well, with improvement in her symptoms.  Plan:  She may resume full activity and regular diet. She  will followup with [place] on a p.r.n. basis. I did tell her that she may still have some foods that cause indigestion and ask her to call us if there are any questions, problems or concerns.  HPI:  This patient underwent a laparoscopic cholecystectomy with operative cholangiogram on 03/10/13. She is in for her first postoperative visit. She notes that her incisional pain has resolved. Her preoperative symptoms have improved. She is not having problems with nausea, vomiting, diarrhea, fevers, chills, or urinary symptoms. She is tolerating diet. She feels that she is progressing well and nearly back to normal. PE:  VS: BP 128/72  Pulse 64  Temp(Src) 97.6 F (36.4 C) (Temporal)  Resp 15  Ht 5\' 8"  (1.727 m)  Wt 182 lb 12.8 oz (82.918 kg)  BMI 27.8 kg/m2  General: The patient is alert and appears comfortable, NAD.  Abdomen: Soft and benign. The incisions are healing nicely. There are no apparent problems.  Data reviewed: IOC: IMPRESSION:  Normal intraoperative cholangiogram.  These images were submitted for radiologic interpretation only.  Please see the procedural report for the amount of contrast and the  fluoroscopy time utilized.  Original Report Authenticated By: Hulan Saas, M.D.  Pathology: Diagnosis Gallbladder - ACUTE CHOLECYSTITIS AND CHOLELITHIASIS. Abigail Miyamoto MD Pathologist, Electronic Signature

## 2013-05-15 NOTE — Patient Instructions (Signed)
We will see you again on an as needed basis. Please call the office at [phone] if you have any questions or concerns. Thank you for allowing us to take care of you.  

## 2013-05-22 ENCOUNTER — Other Ambulatory Visit: Payer: Self-pay | Admitting: Family Medicine

## 2013-05-22 MED ORDER — TRAZODONE HCL 50 MG PO TABS: 50.0000 mg | ORAL_TABLET | Freq: Every day | ORAL | Status: DC

## 2013-05-22 NOTE — Telephone Encounter (Signed)
Please advise refill? I don't see where we have filled the Trazodone?  If ok fax to [phone]

## 2013-05-22 NOTE — Telephone Encounter (Signed)
OK to refill Trazodone 50 mg po qhs, disp #30, 3 rf

## 2013-05-22 NOTE — Telephone Encounter (Signed)
New prescription request for Trazodone

## 2013-06-29 ENCOUNTER — Other Ambulatory Visit: Payer: Self-pay | Admitting: *Deleted

## 2013-06-29 MED ORDER — ESTROGENS CONJUGATED 1.25 MG PO TABS: 1.2500 mg | ORAL_TABLET | Freq: Every day | ORAL | Status: DC

## 2013-06-29 NOTE — Telephone Encounter (Signed)
Rx request to pharmacy/[facility]  

## 2013-08-04 ENCOUNTER — Other Ambulatory Visit: Payer: Self-pay | Admitting: Family Medicine

## 2013-08-04 NOTE — Telephone Encounter (Signed)
Please advise refill? Last RX was done on 04-15-13 quantity 30 with 2 refills

## 2013-08-04 NOTE — Telephone Encounter (Signed)
Please advise 

## 2013-08-04 NOTE — Telephone Encounter (Signed)
OK to refill med with same sig, same number, 2 rf

## 2013-08-13 ENCOUNTER — Telehealth: Payer: Self-pay

## 2013-08-13 DIAGNOSIS — Z0000 Encounter for general adult medical examination without abnormal findings: Secondary | ICD-10-CM

## 2013-08-13 NOTE — Telephone Encounter (Signed)
Lab order placed.

## 2013-08-14 LAB — CBC
MCV: 90.2 fL (ref 78.0–100.0)
Platelets: 43 10*3/uL — ABNORMAL LOW (ref 150–400)
RDW: 14.3 % (ref 11.5–15.5)
WBC: 5.5 10*3/uL (ref 4.0–10.5)

## 2013-08-14 LAB — RENAL FUNCTION PANEL
Albumin: 4 g/dL (ref 3.5–5.2)
Calcium: 9.3 mg/dL (ref 8.4–10.5)
Creat: 0.75 mg/dL (ref 0.50–1.10)
Phosphorus: 3.6 mg/dL (ref 2.3–4.6)

## 2013-08-14 LAB — HEPATIC FUNCTION PANEL
ALT: 15 U/L (ref 0–35)
Bilirubin, Direct: 0.1 mg/dL (ref 0.0–0.3)
Total Bilirubin: 0.3 mg/dL (ref 0.3–1.2)

## 2013-08-14 LAB — LIPID PANEL
HDL: 74 mg/dL (ref >39)
LDL Cholesterol: 93 mg/dL (ref 0–99)
Triglycerides: 91 mg/dL (ref <150)
VLDL: 18 mg/dL (ref 0–40)

## 2013-08-14 LAB — TSH: TSH: 1.8 u[IU]/mL (ref 0.350–4.500)

## 2013-08-17 ENCOUNTER — Encounter: Payer: Self-pay | Admitting: Family Medicine

## 2013-08-17 ENCOUNTER — Ambulatory Visit (INDEPENDENT_AMBULATORY_CARE_PROVIDER_SITE_OTHER): Payer: BC Managed Care – PPO | Admitting: Family Medicine

## 2013-08-17 VITALS — BP 104/70 | HR 57 | Temp 98.1°F | Ht 68.0 in | Wt 190.0 lb

## 2013-08-17 DIAGNOSIS — R911 Solitary pulmonary nodule: Secondary | ICD-10-CM | POA: Insufficient documentation

## 2013-08-17 DIAGNOSIS — Z23 Encounter for immunization: Secondary | ICD-10-CM

## 2013-08-17 DIAGNOSIS — F418 Other specified anxiety disorders: Secondary | ICD-10-CM

## 2013-08-17 DIAGNOSIS — D693 Immune thrombocytopenic purpura: Secondary | ICD-10-CM

## 2013-08-17 DIAGNOSIS — Z0000 Encounter for general adult medical examination without abnormal findings: Secondary | ICD-10-CM

## 2013-08-17 DIAGNOSIS — F329 Major depressive disorder, single episode, unspecified: Secondary | ICD-10-CM

## 2013-08-17 DIAGNOSIS — F341 Dysthymic disorder: Secondary | ICD-10-CM

## 2013-08-17 DIAGNOSIS — F411 Generalized anxiety disorder: Secondary | ICD-10-CM

## 2013-08-17 DIAGNOSIS — R0789 Other chest pain: Secondary | ICD-10-CM

## 2013-08-17 DIAGNOSIS — F32A Depression, unspecified: Secondary | ICD-10-CM

## 2013-08-17 DIAGNOSIS — K219 Gastro-esophageal reflux disease without esophagitis: Secondary | ICD-10-CM

## 2013-08-17 HISTORY — DX: Solitary pulmonary nodule: R91.1

## 2013-08-17 HISTORY — DX: Gastro-esophageal reflux disease without esophagitis: K21.9

## 2013-08-17 MED ORDER — ESCITALOPRAM OXALATE 20 MG PO TABS: 20.0000 mg | ORAL_TABLET | Freq: Every day | ORAL | Status: DC

## 2013-08-17 MED ORDER — ALPRAZOLAM 0.25 MG PO TABS: 0.2500 mg | ORAL_TABLET | Freq: Two times a day (BID) | ORAL | Status: DC | PRN

## 2013-08-17 NOTE — Assessment & Plan Note (Addendum)
Pepcid as needed, try Mylanta prn for substernal Chest pain and Tums qhs, avoid offending foods

## 2013-08-17 NOTE — Assessment & Plan Note (Signed)
Having some anxiety attacks and no improvement with increase of Prozac. Will change to Lexapro 20 mg daily and given a small amount of Alprazolam to use prn for anxiety attacks, reeval in 8- 12 weeks

## 2013-08-17 NOTE — Patient Instructions (Addendum)
Preventive Care for Adults, Female A healthy lifestyle and preventive care can promote health and wellness. Preventive health guidelines for women include the following key practices.  A routine yearly physical is a good way to check with your caregiver about your health and preventive screening. It is a chance to share any concerns and updates on your health, and to receive a thorough exam.  Visit your dentist for a routine exam and preventive care every 6 months. Brush your teeth twice a day and floss once a day. Good oral hygiene prevents tooth decay and gum disease.  The frequency of eye exams is based on your age, health, family medical history, use of contact lenses, and other factors. Follow your caregiver's recommendations for frequency of eye exams.  Eat a healthy diet. Foods like vegetables, fruits, whole grains, low-fat dairy products, and lean protein foods contain the nutrients you need without too many calories. Decrease your intake of foods high in solid fats, added sugars, and salt. Eat the right amount of calories for you.Get information about a proper diet from your caregiver, if necessary.  Regular physical exercise is one of the most important things you can do for your health. Most adults should get at least 150 minutes of moderate-intensity exercise (any activity that increases your heart rate and causes you to sweat) each week. In addition, most adults need muscle-strengthening exercises on 2 or more days a week.  Maintain a healthy weight. The body mass index (BMI) is a screening tool to identify possible weight problems. It provides an estimate of body fat based on height and weight. Your caregiver can help determine your BMI, and can help you achieve or maintain a healthy weight.For adults 20 years and older:  A BMI below 18.5 is considered underweight.  A BMI of 18.5 to 24.9 is normal.  A BMI of 25 to 29.9 is considered overweight.  A BMI of 30 and above is  considered obese.  Maintain normal blood lipids and cholesterol levels by exercising and minimizing your intake of saturated fat. Eat a balanced diet with plenty of fruit and vegetables. Blood tests for lipids and cholesterol should begin at age 20 and be repeated every 5 years. If your lipid or cholesterol levels are high, you are over 50, or you are at high risk for heart disease, you may need your cholesterol levels checked more frequently.Ongoing high lipid and cholesterol levels should be treated with medicines if diet and exercise are not effective.  If you smoke, find out from your caregiver how to quit. If you do not use tobacco, do not start.  If you are pregnant, do not drink alcohol. If you are breastfeeding, be very cautious about drinking alcohol. If you are not pregnant and choose to drink alcohol, do not exceed 1 drink per day. One drink is considered to be 12 ounces (355 mL) of beer, 5 ounces (148 mL) of wine, or 1.5 ounces (44 mL) of liquor.  Avoid use of street drugs. Do not share needles with anyone. Ask for help if you need support or instructions about stopping the use of drugs.  High blood pressure causes heart disease and increases the risk of stroke. Your blood pressure should be checked at least every 1 to 2 years. Ongoing high blood pressure should be treated with medicines if weight loss and exercise are not effective.  If you are 55 to 56 years old, ask your caregiver if you should take aspirin to prevent strokes.  Diabetes   screening involves taking a blood sample to check your fasting blood sugar level. This should be done once every 3 years, after age 45, if you are within normal weight and without risk factors for diabetes. Testing should be considered at a younger age or be carried out more frequently if you are overweight and have at least 1 risk factor for diabetes.  Breast cancer screening is essential preventive care for women. You should practice "breast  self-awareness." This means understanding the normal appearance and feel of your breasts and may include breast self-examination. Any changes detected, no matter how small, should be reported to a caregiver. Women in their 20s and 30s should have a clinical breast exam (CBE) by a caregiver as part of a regular health exam every 1 to 3 years. After age 40, women should have a CBE every year. Starting at age 40, women should consider having a mammography (breast X-ray test) every year. Women who have a family history of breast cancer should talk to their caregiver about genetic screening. Women at a high risk of breast cancer should talk to their caregivers about having magnetic resonance imaging (MRI) and a mammography every year.  The Pap test is a screening test for cervical cancer. A Pap test can show cell changes on the cervix that might become cervical cancer if left untreated. A Pap test is a procedure in which cells are obtained and examined from the lower end of the uterus (cervix).  Women should have a Pap test starting at age 21.  Between ages 21 and 29, Pap tests should be repeated every 2 years.  Beginning at age 30, you should have a Pap test every 3 years as long as the past 3 Pap tests have been normal.  Some women have medical problems that increase the chance of getting cervical cancer. Talk to your caregiver about these problems. It is especially important to talk to your caregiver if a new problem develops soon after your last Pap test. In these cases, your caregiver may recommend more frequent screening and Pap tests.  The above recommendations are the same for women who have or have not gotten the vaccine for human papillomavirus (HPV).  If you had a hysterectomy for a problem that was not cancer or a condition that could lead to cancer, then you no longer need Pap tests. Even if you no longer need a Pap test, a regular exam is a good idea to make sure no other problems are  starting.  If you are between ages 65 and 70, and you have had normal Pap tests going back 10 years, you no longer need Pap tests. Even if you no longer need a Pap test, a regular exam is a good idea to make sure no other problems are starting.  If you have had past treatment for cervical cancer or a condition that could lead to cancer, you need Pap tests and screening for cancer for at least 20 years after your treatment.  If Pap tests have been discontinued, risk factors (such as a new sexual partner) need to be reassessed to determine if screening should be resumed.  The HPV test is an additional test that may be used for cervical cancer screening. The HPV test looks for the virus that can cause the cell changes on the cervix. The cells collected during the Pap test can be tested for HPV. The HPV test could be used to screen women aged 30 years and older, and should   be used in women of any age who have unclear Pap test results. After the age of 30, women should have HPV testing at the same frequency as a Pap test.  Colorectal cancer can be detected and often prevented. Most routine colorectal cancer screening begins at the age of 50 and continues through age 75. However, your caregiver may recommend screening at an earlier age if you have risk factors for colon cancer. On a yearly basis, your caregiver may provide home test kits to check for hidden blood in the stool. Use of a small camera at the end of a tube, to directly examine the colon (sigmoidoscopy or colonoscopy), can detect the earliest forms of colorectal cancer. Talk to your caregiver about this at age 50, when routine screening begins. Direct examination of the colon should be repeated every 5 to 10 years through age 75, unless early forms of pre-cancerous polyps or small growths are found.  Hepatitis C blood testing is recommended for all people born from 1945 through 1965 and any individual with known risks for hepatitis C.  Practice  safe sex. Use condoms and avoid high-risk sexual practices to reduce the spread of sexually transmitted infections (STIs). STIs include gonorrhea, chlamydia, syphilis, trichomonas, herpes, HPV, and human immunodeficiency virus (HIV). Herpes, HIV, and HPV are viral illnesses that have no cure. They can result in disability, cancer, and death. Sexually active women aged 25 and younger should be checked for chlamydia. Older women with new or multiple partners should also be tested for chlamydia. Testing for other STIs is recommended if you are sexually active and at increased risk.  Osteoporosis is a disease in which the bones lose minerals and strength with aging. This can result in serious bone fractures. The risk of osteoporosis can be identified using a bone density scan. Women ages 65 and over and women at risk for fractures or osteoporosis should discuss screening with their caregivers. Ask your caregiver whether you should take a calcium supplement or vitamin D to reduce the rate of osteoporosis.  Menopause can be associated with physical symptoms and risks. Hormone replacement therapy is available to decrease symptoms and risks. You should talk to your caregiver about whether hormone replacement therapy is right for you.  Use sunscreen with sun protection factor (SPF) of 30 or more. Apply sunscreen liberally and repeatedly throughout the day. You should seek shade when your shadow is shorter than you. Protect yourself by wearing long sleeves, pants, a wide-brimmed hat, and sunglasses year round, whenever you are outdoors.  Once a month, do a whole body skin exam, using a mirror to look at the skin on your back. Notify your caregiver of new moles, moles that have irregular borders, moles that are larger than a pencil eraser, or moles that have changed in shape or color.  Stay current with required immunizations.  Influenza. You need a dose every fall (or winter). The composition of the flu vaccine  changes each year, so being vaccinated once is not enough.  Pneumococcal polysaccharide. You need 1 to 2 doses if you smoke cigarettes or if you have certain chronic medical conditions. You need 1 dose at age 65 (or older) if you have never been vaccinated.  Tetanus, diphtheria, pertussis (Tdap, Td). Get 1 dose of Tdap vaccine if you are younger than age 65, are over 65 and have contact with an infant, are a healthcare worker, are pregnant, or simply want to be protected from whooping cough. After that, you need a Td   booster dose every 10 years. Consult your caregiver if you have not had at least 3 tetanus and diphtheria-containing shots sometime in your life or have a deep or dirty wound.  HPV. You need this vaccine if you are a woman age 26 or younger. The vaccine is given in 3 doses over 6 months.  Measles, mumps, rubella (MMR). You need at least 1 dose of MMR if you were born in 1957 or later. You may also need a second dose.  Meningococcal. If you are age 19 to 21 and a first-year college student living in a residence hall, or have one of several medical conditions, you need to get vaccinated against meningococcal disease. You may also need additional booster doses.  Zoster (shingles). If you are age 60 or older, you should get this vaccine.  Varicella (chickenpox). If you have never had chickenpox or you were vaccinated but received only 1 dose, talk to your caregiver to find out if you need this vaccine.  Hepatitis A. You need this vaccine if you have a specific risk factor for hepatitis A virus infection or you simply wish to be protected from this disease. The vaccine is usually given as 2 doses, 6 to 18 months apart.  Hepatitis B. You need this vaccine if you have a specific risk factor for hepatitis B virus infection or you simply wish to be protected from this disease. The vaccine is given in 3 doses, usually over 6 months. Preventive Services / Frequency Ages 19 to 39  Blood  pressure check.** / Every 1 to 2 years.  Lipid and cholesterol check.** / Every 5 years beginning at age 20.  Clinical breast exam.** / Every 3 years for women in their 20s and 30s.  Pap test.** / Every 2 years from ages 21 through 29. Every 3 years starting at age 30 through age 65 or 70 with a history of 3 consecutive normal Pap tests.  HPV screening.** / Every 3 years from ages 30 through ages 65 to 70 with a history of 3 consecutive normal Pap tests.  Hepatitis C blood test.** / For any individual with known risks for hepatitis C.  Skin self-exam. / Monthly.  Influenza immunization.** / Every year.  Pneumococcal polysaccharide immunization.** / 1 to 2 doses if you smoke cigarettes or if you have certain chronic medical conditions.  Tetanus, diphtheria, pertussis (Tdap, Td) immunization. / A one-time dose of Tdap vaccine. After that, you need a Td booster dose every 10 years.  HPV immunization. / 3 doses over 6 months, if you are 26 and younger.  Measles, mumps, rubella (MMR) immunization. / You need at least 1 dose of MMR if you were born in 1957 or later. You may also need a second dose.  Meningococcal immunization. / 1 dose if you are age 19 to 21 and a first-year college student living in a residence hall, or have one of several medical conditions, you need to get vaccinated against meningococcal disease. You may also need additional booster doses.  Varicella immunization.** / Consult your caregiver.  Hepatitis A immunization.** / Consult your caregiver. 2 doses, 6 to 18 months apart.  Hepatitis B immunization.** / Consult your caregiver. 3 doses usually over 6 months. Ages 40 to 64  Blood pressure check.** / Every 1 to 2 years.  Lipid and cholesterol check.** / Every 5 years beginning at age 20.  Clinical breast exam.** / Every year after age 40.  Mammogram.** / Every year beginning at age 40   and continuing for as long as you are in good health. Consult with your  caregiver.  Pap test.** / Every 3 years starting at age 30 through age 65 or 70 with a history of 3 consecutive normal Pap tests.  HPV screening.** / Every 3 years from ages 30 through ages 65 to 70 with a history of 3 consecutive normal Pap tests.  Fecal occult blood test (FOBT) of stool. / Every year beginning at age 50 and continuing until age 75. You may not need to do this test if you get a colonoscopy every 10 years.  Flexible sigmoidoscopy or colonoscopy.** / Every 5 years for a flexible sigmoidoscopy or every 10 years for a colonoscopy beginning at age 50 and continuing until age 75.  Hepatitis C blood test.** / For all people born from 1945 through 1965 and any individual with known risks for hepatitis C.  Skin self-exam. / Monthly.  Influenza immunization.** / Every year.  Pneumococcal polysaccharide immunization.** / 1 to 2 doses if you smoke cigarettes or if you have certain chronic medical conditions.  Tetanus, diphtheria, pertussis (Tdap, Td) immunization.** / A one-time dose of Tdap vaccine. After that, you need a Td booster dose every 10 years.  Measles, mumps, rubella (MMR) immunization. / You need at least 1 dose of MMR if you were born in 1957 or later. You may also need a second dose.  Varicella immunization.** / Consult your caregiver.  Meningococcal immunization.** / Consult your caregiver.  Hepatitis A immunization.** / Consult your caregiver. 2 doses, 6 to 18 months apart.  Hepatitis B immunization.** / Consult your caregiver. 3 doses, usually over 6 months. Ages 65 and over  Blood pressure check.** / Every 1 to 2 years.  Lipid and cholesterol check.** / Every 5 years beginning at age 20.  Clinical breast exam.** / Every year after age 40.  Mammogram.** / Every year beginning at age 40 and continuing for as long as you are in good health. Consult with your caregiver.  Pap test.** / Every 3 years starting at age 30 through age 65 or 70 with a 3  consecutive normal Pap tests. Testing can be stopped between 65 and 70 with 3 consecutive normal Pap tests and no abnormal Pap or HPV tests in the past 10 years.  HPV screening.** / Every 3 years from ages 30 through ages 65 or 70 with a history of 3 consecutive normal Pap tests. Testing can be stopped between 65 and 70 with 3 consecutive normal Pap tests and no abnormal Pap or HPV tests in the past 10 years.  Fecal occult blood test (FOBT) of stool. / Every year beginning at age 50 and continuing until age 75. You may not need to do this test if you get a colonoscopy every 10 years.  Flexible sigmoidoscopy or colonoscopy.** / Every 5 years for a flexible sigmoidoscopy or every 10 years for a colonoscopy beginning at age 50 and continuing until age 75.  Hepatitis C blood test.** / For all people born from 1945 through 1965 and any individual with known risks for hepatitis C.  Osteoporosis screening.** / A one-time screening for women ages 65 and over and women at risk for fractures or osteoporosis.  Skin self-exam. / Monthly.  Influenza immunization.** / Every year.  Pneumococcal polysaccharide immunization.** / 1 dose at age 65 (or older) if you have never been vaccinated.  Tetanus, diphtheria, pertussis (Tdap, Td) immunization. / A one-time dose of Tdap vaccine if you are over   65 and have contact with an infant, are a healthcare worker, or simply want to be protected from whooping cough. After that, you need a Td booster dose every 10 years.  Varicella immunization.** / Consult your caregiver.  Meningococcal immunization.** / Consult your caregiver.  Hepatitis A immunization.** / Consult your caregiver. 2 doses, 6 to 18 months apart.  Hepatitis B immunization.** / Check with your caregiver. 3 doses, usually over 6 months. ** Family history and personal history of risk and conditions may change your caregiver's recommendations. Document Released: 01/15/2002 Document Revised: 02/11/2012  Document Reviewed: 04/16/2011 [employer] Patient Information 2014 [employer]  

## 2013-08-17 NOTE — Telephone Encounter (Signed)
So patient was in today, she is asymptomatic today.

## 2013-08-17 NOTE — Progress Notes (Signed)
[name], female   DOB: 12/05/1955, 57 y.o.   MRN: [id number] Laura Bender [id number] 1956-07-21 08/17/2013      Progress Note-Follow Up  Subjective  Chief Complaint  Chief Complaint  Patient presents with  . Annual Exam    physical  . Injections    flu    HPI  Patient is a 57 year old Caucasian female who is in today for annual exam. She has a few concerns. One is when she was in March 2014 and coughing up blood he didn't CAT scan of the chest. He's had a pulmonary nodule. Review of her history reveals she did smoke for a short time less than 5 years when she was young and always less than a pack per day. She has no persistent hemoptysis. She has a mild a.m. Cough but it is nonproductive and she does have worsening reflux over the last 2 months. She notes reflux to the point of needing Pepcid couple times a week at this time. Worse when she eats fatty and spicy foods. No bloody or tarry stool. No change in bowel habits. No anorexia or constant nausea. He is complaining of some substernal chest pain a couple times a week as well. Describes it as a dull ache without associated symptoms. No palpitations, shortness of breath diaphoresis or nausea and time. She does note depression and anxiety are worsening as well. Did not respond to the increase in Prozac. Has a low mood but continues to get to work. Denies suicidal ideation but has anhedonia and lack of motivation. He is having episodes of anxiety attacks with trouble breathing palpitations and tremulousness a couple times a week as well. Triggered by both family and work stress  Past Medical History  Diagnosis Date  . Clotting disorder   . Lipoma 03-04-12    Rt. back about scapular ares  . 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  '95- blow normal levels, but stable.  . Fibromyalgia 03-04-12    Sporadic pain  . Depression 03-04-12    tx. meds  . PONV (postoperative nausea and vomiting)   . Allergic  state 04/17/2013  . Depression with anxiety 04/17/2013  . Preventative health care 04/17/2013    Past Surgical History  Procedure Laterality Date  . Splenectomy, total  1995  . Appendectomy  1995  . Irrigation and debridement abscess  03-04-12    x2- buttocks  . Parotid gland tumor excision Left     benign  . Breast surgery  03-04-12    12'12 -left needle biopsy(benign)-Titanium needle marker remains  . Mass excision  03/10/2012    Procedure: EXCISION MASS;  Surgeon: Adolph Pollack, MD;  Location: [facility];  Service: General;  Laterality: N/A;  Removal of back lipoma  . Abdominal hysterectomy  1995    total, endometriosis, fibroid, ovarian atrophy  . Colonoscopy  2008    WNL, Dr Matthias Hughs  . Cholecystectomy N/A 04/24/2013    Procedure: LAPAROSCOPIC CHOLECYSTECTOMY WITH INTRAOPERATIVE CHOLANGIOGRAM;  Surgeon: Currie Paris, MD;  Location: [facility];  Service: General;  Laterality: N/A;    Family History  Problem Relation Age of Onset  . Heart disease Maternal Grandmother   . Heart disease Maternal Grandfather   . Heart attack Maternal Grandfather 62  . Heart disease Paternal Grandmother   . Heart attack Paternal Grandmother 1  . Heart disease Paternal Grandfather   . Cancer Paternal Grandfather     chest- smoker  . Diabetes Mother  type 2- controlled by diet  . Atrial fibrillation Father   . Hyperlipidemia Sister   . Depression Sister     History   Social History  . Marital Status: Single    Spouse Name: N/A    Number of Children: N/A  . Years of Education: N/A   Occupational History  . Not on file.   Social History Main Topics  . Smoking status: Former Smoker -- 5 years    Quit date: 02/21/1983  . Smokeless tobacco: Never Used  . Alcohol Use: 0.0 oz/week    3-4 Cans of beer per week     Comment: weekends -beer  . Drug Use: No  . Sexual Activity: No   Other Topics Concern  . Not on file   Social History Narrative   Works at [employer]    Current  Outpatient Prescriptions on File Prior to Visit  Medication Sig Dispense Refill  . acetaminophen (TYLENOL) 500 MG tablet Take 1,000 mg by mouth every 6 (six) hours as needed for pain.      Marland Kitchen BIOTIN PO Take 1 capsule by mouth daily.      . clorazepate (TRANXENE) 7.5 MG tablet TAKE 1 TABLET BY MOUTH ONCE DAILY AT 8 IN THE EVENING  30 tablet  2  . estrogens, conjugated, (PREMARIN) 1.25 MG tablet Take 1 tablet (1.25 mg total) by mouth daily.  30 tablet  2  . FLUoxetine (PROZAC) 20 MG capsule Take 40 mg by mouth daily with breakfast.       . traZODone (DESYREL) 50 MG tablet Take 1 tablet (50 mg total) by mouth at bedtime.  30 tablet  3   No current facility-administered medications on file prior to visit.    Allergies  Allergen Reactions  . Codeine Nausea And Vomiting    Review of Systems  Review of Systems  Constitutional: Negative for fever, chills and malaise/fatigue.  HENT: Negative for hearing loss, nosebleeds and congestion.   Eyes: Negative for discharge.  Respiratory: Negative for cough, sputum production, shortness of breath and wheezing.   Cardiovascular: Negative for chest pain, palpitations and leg swelling.  Gastrointestinal: Negative for heartburn, nausea, vomiting, abdominal pain, diarrhea, constipation and blood in stool.  Genitourinary: Negative for dysuria, urgency, frequency and hematuria.  Musculoskeletal: Negative for myalgias, back pain and falls.  Skin: Negative for rash.  Neurological: Negative for dizziness, tremors, sensory change, focal weakness, loss of consciousness, weakness and headaches.  Endo/Heme/Allergies: Negative for polydipsia. Does not bruise/bleed easily.  Psychiatric/Behavioral: Negative for depression and suicidal ideas. The patient is not nervous/anxious and does not have insomnia.     Objective  BP 104/70  Pulse 57  Temp(Src) 98.1 F (36.7 C) (Oral)  Ht 5\' 8"  (1.727 m)  Wt 190 lb 0.6 oz (86.202 kg)  BMI 28.9 kg/m2  SpO2 98%  Physical  Exam  Physical Exam  Constitutional: She is oriented to person, place, and time and well-developed, well-nourished, and in no distress. No distress.  HENT:  Head: Normocephalic and atraumatic.  Eyes: Conjunctivae are normal.  Neck: Neck supple. No thyromegaly present.  Cardiovascular: Normal rate, regular rhythm and normal heart sounds.   No murmur heard. Pulmonary/Chest: Effort normal and breath sounds normal. She has no wheezes.  Abdominal: She exhibits no distension and no mass.  Musculoskeletal: She exhibits no edema.  Lymphadenopathy:    She has no cervical adenopathy.  Neurological: She is alert and oriented to person, place, and time.  Skin: Skin is warm and  dry. No rash noted. She is not diaphoretic.  Psychiatric: Memory, affect and judgment normal.    Lab Results  Component Value Date   TSH 1.800 08/13/2013   Lab Results  Component Value Date   WBC 5.5 08/13/2013   HGB 13.0 08/13/2013   HCT 39.5 08/13/2013   MCV 90.2 08/13/2013   PLT 43* 08/13/2013   Lab Results  Component Value Date   CREATININE 0.75 08/13/2013   BUN 16 08/13/2013   NA 139 08/13/2013   K 4.6 08/13/2013   CL 101 08/13/2013   CO2 31 08/13/2013   Lab Results  Component Value Date   ALT 15 08/13/2013   AST 19 08/13/2013   ALKPHOS 88 08/13/2013   BILITOT 0.3 08/13/2013   Lab Results  Component Value Date   CHOL 185 08/13/2013   Lab Results  Component Value Date   HDL 74 08/13/2013   Lab Results  Component Value Date   LDLCALC 93 08/13/2013   Lab Results  Component Value Date   TRIG 91 08/13/2013   Lab Results  Component Value Date   CHOLHDL 2.5 08/13/2013     Assessment & Plan  ITP (idiopathic thrombocytopenic purpura) s/p splenectomy 1995 Asymptomatic, patient will often not get treatment until under plt count of 10  GERD (gastroesophageal reflux disease) Pepcid as needed, try Mylanta prn for substernal Chest pain and Tums qhs, avoid offending foods  Solitary pulmonary nodule Repeat CT  scan now for surveillance given the addition of some intermittent atypical chest pain  Depression with anxiety Having some anxiety attacks and no improvement with increase of Prozac. Will change to Lexapro 20 mg daily and given a small amount of Alprazolam to use prn for anxiety attacks, reeval in 8- 12 weeks  Preventative health care Given flu shot today, annual labs reviewed, encouraged pelvic exam at next annual hs not had one in years. Encouraged routine exercise

## 2013-08-17 NOTE — Assessment & Plan Note (Signed)
Given flu shot today, annual labs reviewed, encouraged pelvic exam at next annual hs not had one in years. Encouraged routine exercise

## 2013-08-17 NOTE — Assessment & Plan Note (Signed)
Asymptomatic, patient will often not get treatment until under plt count of 10

## 2013-08-17 NOTE — Assessment & Plan Note (Signed)
Repeat CT scan now for surveillance given the addition of some intermittent atypical chest pain

## 2013-09-01 ENCOUNTER — Ambulatory Visit (HOSPITAL_BASED_OUTPATIENT_CLINIC_OR_DEPARTMENT_OTHER)
Admission: RE | Admit: 2013-09-01 | Discharge: 2013-09-01 | Disposition: A | Discharge status: Home / Self Care | Payer: BC Managed Care – PPO | Source: Ambulatory Visit | Attending: Family Medicine | Admitting: Family Medicine

## 2013-09-01 ENCOUNTER — Encounter (HOSPITAL_BASED_OUTPATIENT_CLINIC_OR_DEPARTMENT_OTHER): Payer: Self-pay

## 2013-09-01 DIAGNOSIS — R918 Other nonspecific abnormal finding of lung field: Secondary | ICD-10-CM | POA: Insufficient documentation

## 2013-09-01 DIAGNOSIS — K7689 Other specified diseases of liver: Secondary | ICD-10-CM | POA: Insufficient documentation

## 2013-09-01 DIAGNOSIS — R0789 Other chest pain: Secondary | ICD-10-CM

## 2013-09-01 DIAGNOSIS — R911 Solitary pulmonary nodule: Secondary | ICD-10-CM

## 2013-09-01 MED ORDER — IOHEXOL 300 MG/ML  SOLN
100.0000 mL | Freq: Once | INTRAMUSCULAR | Status: AC | PRN
  Administered 2013-09-01: 100 mL via INTRAVENOUS

## 2013-09-02 ENCOUNTER — Telehealth: Payer: Self-pay

## 2013-09-02 ENCOUNTER — Ambulatory Visit (HOSPITAL_BASED_OUTPATIENT_CLINIC_OR_DEPARTMENT_OTHER): Payer: BC Managed Care – PPO

## 2013-09-02 NOTE — Telephone Encounter (Signed)
Message copied by Darnell Level on Wed Sep 02, 2013 10:45 AM ------      Message from: Danise Edge A      Created: Tue Sep 01, 2013  7:49 PM       Notify pulmonary nodule appears stable and benign they recommend 1 more repeat CT in a year to confirm. The new finding is some lesions in her liver which might be hemangiomas but we need a MRI of liver to confirm. I will order once she is aware ------

## 2013-09-02 NOTE — Telephone Encounter (Signed)
Pt notified of results of CT and aware of needing an MRI. She would like to know how urgent the MRI is, she will be out of town until Monday and then going out of town again after that for 2 weeks. Please advise.

## 2013-09-02 NOTE — Telephone Encounter (Signed)
So if she is not in any pain then she can do it when she gets back I just cannot tell her what it is until we get the image

## 2013-09-02 NOTE — Telephone Encounter (Signed)
Patient informed and states she will call us when she gets back into town

## 2013-09-15 ENCOUNTER — Telehealth: Payer: Self-pay | Admitting: Family Medicine

## 2013-09-15 ENCOUNTER — Other Ambulatory Visit: Payer: Self-pay | Admitting: Family Medicine

## 2013-09-15 DIAGNOSIS — K769 Liver disease, unspecified: Secondary | ICD-10-CM

## 2013-09-15 NOTE — Telephone Encounter (Signed)
I have ordered MRI

## 2013-09-15 NOTE — Telephone Encounter (Signed)
Patient states that Dr. Abner Greenspan told her that she would order an MRI for patient regarding the lesions on her liver. She states that no one has called her regarding this. I did not see an order in patients record.

## 2013-09-15 NOTE — Telephone Encounter (Signed)
Please advise 

## 2013-09-28 ENCOUNTER — Other Ambulatory Visit: Payer: Self-pay | Admitting: Family Medicine

## 2013-09-28 NOTE — Telephone Encounter (Signed)
Please advise Trazodone refill? Last RX was 05-22-13 quantity 30 with 3 refills.  If ok fax to [phone]

## 2013-10-03 ENCOUNTER — Ambulatory Visit (HOSPITAL_BASED_OUTPATIENT_CLINIC_OR_DEPARTMENT_OTHER)
Admission: RE | Admit: 2013-10-03 | Discharge: 2013-10-03 | Disposition: A | Discharge status: Home / Self Care | Payer: BC Managed Care – PPO | Source: Ambulatory Visit | Attending: Family Medicine | Admitting: Family Medicine

## 2013-10-03 DIAGNOSIS — K769 Liver disease, unspecified: Secondary | ICD-10-CM

## 2013-10-03 DIAGNOSIS — R1012 Left upper quadrant pain: Secondary | ICD-10-CM | POA: Insufficient documentation

## 2013-10-03 DIAGNOSIS — D1809 Hemangioma of other sites: Secondary | ICD-10-CM | POA: Insufficient documentation

## 2013-10-03 DIAGNOSIS — K7689 Other specified diseases of liver: Secondary | ICD-10-CM | POA: Insufficient documentation

## 2013-10-03 MED ORDER — GADOBENATE DIMEGLUMINE 529 MG/ML IV SOLN: 17.0000 mL | Freq: Once | INTRAVENOUS | Status: AC | PRN

## 2013-10-08 ENCOUNTER — Telehealth: Payer: Self-pay | Admitting: *Deleted

## 2013-10-08 NOTE — Telephone Encounter (Signed)
Pt called requesting results from MRI done on Saturday/SLS Please Advise.

## 2013-10-08 NOTE — Telephone Encounter (Signed)
So I sent this already. 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 No further work up at this time

## 2013-10-09 NOTE — Telephone Encounter (Signed)
Patient informed & understood/SLS

## 2013-10-16 ENCOUNTER — Ambulatory Visit: Payer: BC Managed Care – PPO | Admitting: Family Medicine

## 2013-10-19 ENCOUNTER — Encounter: Payer: Self-pay | Admitting: Family Medicine

## 2013-10-19 ENCOUNTER — Ambulatory Visit (INDEPENDENT_AMBULATORY_CARE_PROVIDER_SITE_OTHER): Payer: BC Managed Care – PPO | Admitting: Family Medicine

## 2013-10-19 VITALS — BP 120/78 | HR 75 | Temp 98.1°F | Ht 68.0 in | Wt 190.1 lb

## 2013-10-19 DIAGNOSIS — F418 Other specified anxiety disorders: Secondary | ICD-10-CM

## 2013-10-19 DIAGNOSIS — D693 Immune thrombocytopenic purpura: Secondary | ICD-10-CM

## 2013-10-19 DIAGNOSIS — F32A Depression, unspecified: Secondary | ICD-10-CM

## 2013-10-19 DIAGNOSIS — R7309 Other abnormal glucose: Secondary | ICD-10-CM

## 2013-10-19 DIAGNOSIS — Z23 Encounter for immunization: Secondary | ICD-10-CM

## 2013-10-19 DIAGNOSIS — D696 Thrombocytopenia, unspecified: Secondary | ICD-10-CM

## 2013-10-19 DIAGNOSIS — F329 Major depressive disorder, single episode, unspecified: Secondary | ICD-10-CM

## 2013-10-19 DIAGNOSIS — F411 Generalized anxiety disorder: Secondary | ICD-10-CM

## 2013-10-19 DIAGNOSIS — K219 Gastro-esophageal reflux disease without esophagitis: Secondary | ICD-10-CM

## 2013-10-19 DIAGNOSIS — R739 Hyperglycemia, unspecified: Secondary | ICD-10-CM

## 2013-10-19 DIAGNOSIS — IMO0001 Reserved for inherently not codable concepts without codable children: Secondary | ICD-10-CM

## 2013-10-19 DIAGNOSIS — M797 Fibromyalgia: Secondary | ICD-10-CM

## 2013-10-19 DIAGNOSIS — R1013 Epigastric pain: Secondary | ICD-10-CM

## 2013-10-19 DIAGNOSIS — F341 Dysthymic disorder: Secondary | ICD-10-CM

## 2013-10-19 LAB — RENAL FUNCTION PANEL
Albumin: 3.9 g/dL (ref 3.5–5.2)
Calcium: 9.4 mg/dL (ref 8.4–10.5)
Creat: 0.81 mg/dL (ref 0.50–1.10)
Phosphorus: 2.5 mg/dL (ref 2.3–4.6)
Sodium: 135 mEq/L (ref 135–145)

## 2013-10-19 LAB — CBC
MCH: 30.4 pg (ref 26.0–34.0)
MCHC: 34.1 g/dL (ref 30.0–36.0)
MCV: 89.2 fL (ref 78.0–100.0)
Platelets: 47 10*3/uL — ABNORMAL LOW (ref 150–400)
RDW: 13.6 % (ref 11.5–15.5)
WBC: 5.6 10*3/uL (ref 4.0–10.5)

## 2013-10-19 LAB — HEMOGLOBIN A1C
Hgb A1c MFr Bld: 5.1 % (ref <5.7)
Mean Plasma Glucose: 100 mg/dL (ref <117)

## 2013-10-19 MED ORDER — OMEPRAZOLE 20 MG PO CPDR: 20.0000 mg | DELAYED_RELEASE_CAPSULE | Freq: Every day | ORAL | Status: DC

## 2013-10-19 MED ORDER — ESCITALOPRAM OXALATE 20 MG PO TABS: 20.0000 mg | ORAL_TABLET | Freq: Every day | ORAL | Status: DC

## 2013-10-19 MED ORDER — ALPRAZOLAM 0.25 MG PO TABS: 0.2500 mg | ORAL_TABLET | Freq: Two times a day (BID) | ORAL | Status: DC | PRN

## 2013-10-19 NOTE — Progress Notes (Signed)
Pre visit review using our clinic review tool, if applicable. No additional management support is needed unless otherwise documented below in the visit note. 

## 2013-10-19 NOTE — Progress Notes (Signed)
[name], female   DOB: Nov 06, 1956, 57 y.o.   MRN: [id number] Laura Bender [id number] [birth year] 10/19/2013      Progress Note-Follow Up  Subjective  Chief Complaint  Chief Complaint  Patient presents with  . Follow-up    8 week  . Injections    prevnar    HPI  Patient is a 57 year old Caucasian female who is in today for followup. Generally doing well. Does believe that oh is helping partially and has had no difficulty with it. Denies headaches, chest pain, palpitations, shortness of breath or worsening anxiety. No suicidal lesion. She is noting some persistent heartburn. Using relates with temporary effect been having nearly daily symptoms with burning, dyspepsia and sour taste into her throat frequently.  Past Medical History  Diagnosis Date  . Clotting disorder   . Lipoma 03-04-12    Rt. back about scapular ares  . 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  '95- blow normal levels, but stable.  . Fibromyalgia 03-04-12    Sporadic pain  . Depression 03-04-12    tx. meds  . PONV (postoperative nausea and vomiting)   . Allergic state 04/17/2013  . Depression with anxiety 04/17/2013  . Preventative health care 04/17/2013  . GERD (gastroesophageal reflux disease) 08/17/2013  . Solitary pulmonary nodule 08/17/2013    LLL seen on CT in March 2014  . Preventative health care 08/17/2013    Past Surgical History  Procedure Laterality Date  . Splenectomy, total  1995  . Appendectomy  1995  . Irrigation and debridement abscess  03-04-12    x2- buttocks  . Parotid gland tumor excision Left     benign  . Breast surgery  03-04-12    12'12 -left needle biopsy(benign)-Titanium needle marker remains  . Mass excision  03/10/2012    Procedure: EXCISION MASS;  Surgeon: Adolph Pollack, MD;  Location: [facility];  Service: General;  Laterality: N/A;  Removal of back lipoma  . Abdominal hysterectomy  1995    total, endometriosis, fibroid, ovarian atrophy  .  Colonoscopy  2008    WNL, Dr Matthias Hughs  . Cholecystectomy N/A 04/24/2013    Procedure: LAPAROSCOPIC CHOLECYSTECTOMY WITH INTRAOPERATIVE CHOLANGIOGRAM;  Surgeon: Currie Paris, MD;  Location: [facility];  Service: General;  Laterality: N/A;    Family History  Problem Relation Age of Onset  . Heart disease Maternal Grandmother   . Heart disease Maternal Grandfather   . Heart attack Maternal Grandfather 62  . Heart disease Paternal Grandmother   . Heart attack Paternal Grandmother 64  . Heart disease Paternal Grandfather   . Cancer Paternal Grandfather     chest- smoker  . Diabetes Mother     type 2- controlled by diet  . Atrial fibrillation Father   . Hyperlipidemia Sister   . Depression Sister     History   Social History  . Marital Status: Single    Spouse Name: N/A    Number of Children: N/A  . Years of Education: N/A   Occupational History  . Not on file.   Social History Main Topics  . Smoking status: Former Smoker -- 5 years    Quit date: 02/21/1983  . Smokeless tobacco: Never Used  . Alcohol Use: 0.0 oz/week    3-4 Cans of beer per week     Comment: weekends -beer  . Drug Use: No  . Sexual Activity: No   Other Topics Concern  . Not on file  Social History Narrative   Works at [employer]    Current Outpatient Prescriptions on File Prior to Visit  Medication Sig Dispense Refill  . acetaminophen (TYLENOL) 500 MG tablet Take 1,000 mg by mouth every 6 (six) hours as needed for pain.      Marland Kitchen ALPRAZolam (XANAX) 0.25 MG tablet Take 1 tablet (0.25 mg total) by mouth 2 (two) times daily as needed for sleep or anxiety.  20 tablet  1  . BIOTIN PO Take 1 capsule by mouth daily.      . clorazepate (TRANXENE) 7.5 MG tablet TAKE 1 TABLET BY MOUTH ONCE DAILY AT 8 IN THE EVENING  30 tablet  2  . escitalopram (LEXAPRO) 20 MG tablet Take 1 tablet (20 mg total) by mouth daily.  30 tablet  3  . PREMARIN 1.25 MG tablet TAKE 1 TABLET (1.25 MG TOTAL) BY MOUTH DAILY.  30  tablet  2  . traZODone (DESYREL) 50 MG tablet TAKE 1 TABLET (50 MG) BY MOUTH AT BEDTIME.  30 tablet  3   No current facility-administered medications on file prior to visit.    Allergies  Allergen Reactions  . Codeine Nausea And Vomiting    Review of Systems  Review of Systems  Constitutional: Negative for fever and malaise/fatigue.  HENT: Negative for congestion.   Eyes: Negative for discharge.  Respiratory: Negative for shortness of breath.   Cardiovascular: Negative for chest pain, palpitations and leg swelling.  Gastrointestinal: Positive for heartburn. Negative for nausea, abdominal pain and diarrhea.  Genitourinary: Negative for dysuria.  Musculoskeletal: Negative for falls.  Skin: Negative for rash.  Neurological: Negative for loss of consciousness and headaches.  Endo/Heme/Allergies: Negative for polydipsia.  Psychiatric/Behavioral: Negative for depression and suicidal ideas. The patient is not nervous/anxious and does not have insomnia.     Objective  BP 120/78  Pulse 75  Temp(Src) 98.1 F (36.7 C) (Oral)  Ht 5\' 8"  (1.727 m)  Wt 190 lb 1.9 oz (86.238 kg)  BMI 28.91 kg/m2  SpO2 97%  Physical Exam  Physical Exam  Constitutional: She is oriented to person, place, and time and well-developed, well-nourished, and in no distress. No distress.  HENT:  Head: Normocephalic and atraumatic.  Eyes: Conjunctivae are normal.  Neck: Neck supple. No thyromegaly present.  Cardiovascular: Normal rate, regular rhythm and normal heart sounds.   No murmur heard. Pulmonary/Chest: Effort normal and breath sounds normal. She has no wheezes.  Abdominal: She exhibits no distension and no mass.  Musculoskeletal: She exhibits no edema.  Lymphadenopathy:    She has no cervical adenopathy.  Neurological: She is alert and oriented to person, place, and time.  Skin: Skin is warm and dry. No rash noted. She is not diaphoretic.  Psychiatric: Memory, affect and judgment normal.     Lab Results  Component Value Date   TSH 1.800 08/13/2013   Lab Results  Component Value Date   WBC 5.5 08/13/2013   HGB 13.0 08/13/2013   HCT 39.5 08/13/2013   MCV 90.2 08/13/2013   PLT 43* 08/13/2013   Lab Results  Component Value Date   CREATININE 0.75 08/13/2013   BUN 16 08/13/2013   NA 139 08/13/2013   K 4.6 08/13/2013   CL 101 08/13/2013   CO2 31 08/13/2013   Lab Results  Component Value Date   ALT 15 08/13/2013   AST 19 08/13/2013   ALKPHOS 88 08/13/2013   BILITOT 0.3 08/13/2013   Lab Results  Component Value Date  CHOL 185 08/13/2013   Lab Results  Component Value Date   HDL 74 08/13/2013   Lab Results  Component Value Date   LDLCALC 93 08/13/2013   Lab Results  Component Value Date   TRIG 91 08/13/2013   Lab Results  Component Value Date   CHOLHDL 2.5 08/13/2013     Assessment & Plan  ITP (idiopathic thrombocytopenic purpura) s/p splenectomy 1995 Repeat CBC today  GERD (gastroesophageal reflux disease) Avoid offending foods, add Omeprazole daily  Depression with anxiety Partial response to Lexapro, try 20 mg daily reassess at next visit. Alprazolam prn

## 2013-10-19 NOTE — Assessment & Plan Note (Signed)
Repeat CBC today 

## 2013-10-19 NOTE — Patient Instructions (Signed)
DASH Diet  The DASH diet stands for "Dietary Approaches to Stop Hypertension." It is a healthy eating plan that has been shown to reduce high blood pressure (hypertension) in as little as 14 days, while also possibly providing other significant health benefits. These other health benefits include reducing the risk of breast cancer after menopause and reducing the risk of type 2 diabetes, heart disease, colon cancer, and stroke. Health benefits also include weight loss and slowing kidney failure in patients with chronic kidney disease.   DIET GUIDELINES  · Limit salt (sodium). Your diet should contain less than 1500 mg of sodium daily.  · Limit refined or processed carbohydrates. Your diet should include mostly whole grains. Desserts and added sugars should be used sparingly.  · Include small amounts of heart-healthy fats. These types of fats include nuts, oils, and tub margarine. Limit saturated and trans fats. These fats have been shown to be harmful in the body.  CHOOSING FOODS   The following food groups are based on a 2000 calorie diet. See your Registered Dietitian for individual calorie needs.  Grains and Grain Products (6 to 8 servings daily)  · Eat More Often: Whole-wheat bread, brown rice, whole-grain or wheat pasta, quinoa, popcorn without added fat or salt (air popped).  · Eat Less Often: White bread, white pasta, white rice, cornbread.  Vegetables (4 to 5 servings daily)  · Eat More Often: Fresh, frozen, and canned vegetables. Vegetables may be raw, steamed, roasted, or grilled with a minimal amount of fat.  · Eat Less Often/Avoid: Creamed or fried vegetables. Vegetables in a cheese sauce.  Fruit (4 to 5 servings daily)  · Eat More Often: All fresh, canned (in natural juice), or frozen fruits. Dried fruits without added sugar. One hundred percent fruit juice (½ cup [237 mL] daily).  · Eat Less Often: Dried fruits with added sugar. Canned fruit in light or heavy syrup.  Lean Meats, Fish, and Poultry (2  servings or less daily. One serving is 3 to 4 oz [85-114 g]).  · Eat More Often: Ninety percent or leaner ground beef, tenderloin, sirloin. Round cuts of beef, chicken breast, turkey breast. All fish. Grill, bake, or broil your meat. Nothing should be fried.  · Eat Less Often/Avoid: Fatty cuts of meat, turkey, or chicken leg, thigh, or wing. Fried cuts of meat or fish.  Dairy (2 to 3 servings)  · Eat More Often: Low-fat or fat-free milk, low-fat plain or light yogurt, reduced-fat or part-skim cheese.  · Eat Less Often/Avoid: Milk (whole, 2%). Whole milk yogurt. Full-fat cheeses.  Nuts, Seeds, and Legumes (4 to 5 servings per week)  · Eat More Often: All without added salt.  · Eat Less Often/Avoid: Salted nuts and seeds, canned beans with added salt.  Fats and Sweets (limited)  · Eat More Often: Vegetable oils, tub margarines without trans fats, sugar-free gelatin. Mayonnaise and salad dressings.  · Eat Less Often/Avoid: Coconut oils, palm oils, butter, stick margarine, cream, half and half, cookies, candy, pie.  FOR MORE INFORMATION  The Dash Diet Eating Plan: www.dashdiet.org  Document Released: 11/08/2011 Document Revised: 02/11/2012 Document Reviewed: 11/08/2011  [employer]® Patient Information ©2014 [employer].

## 2013-10-23 NOTE — Assessment & Plan Note (Signed)
Partial response to Lexapro, try 20 mg daily reassess at next visit. Alprazolam prn

## 2013-10-23 NOTE — Assessment & Plan Note (Signed)
Avoid offending foods, add Omeprazole daily

## 2013-10-26 ENCOUNTER — Other Ambulatory Visit: Payer: Self-pay | Admitting: *Deleted

## 2013-10-26 ENCOUNTER — Telehealth: Payer: Self-pay

## 2013-10-26 NOTE — Telephone Encounter (Signed)
Patient left a message stating that her Trazodone was increased to 1 1/2 tablets daily? Pt needs new RX to read 45 tabs.  Please advise?

## 2013-10-26 NOTE — Telephone Encounter (Signed)
Medication Detail      Disp Refills Start End     traZODone (DESYREL) 50 MG tablet 30 tablet 3 09/28/2013     Sig: TAKE 1 TABLET (50 MG) BY MOUTH AT BEDTIME.    E-Prescribing Status: Sent to pharmacy (09/28/2013 1:37 PM EDT)    Rx request Denied: Medication Detail: Last Rx sent 10.27.14, #30x3-Too Soon for request/SLS

## 2013-10-26 NOTE — Telephone Encounter (Signed)
I am fine with increasing her Trazadone to 75 mg qhs, disp 50 mg tabs take 1 1/2 tab po qhs, disp #45 with 2 rf

## 2013-10-27 MED ORDER — TRAZODONE HCL 50 MG PO TABS: 75.0000 mg | ORAL_TABLET | Freq: Every day | ORAL | Status: DC

## 2013-10-28 ENCOUNTER — Telehealth: Payer: Self-pay | Admitting: *Deleted

## 2013-10-28 NOTE — Telephone Encounter (Addendum)
Faxed refill request received from pharmacy for Clorazepate 7.5 mg Last filled by MD on 09.02.14, #30x2 Last AEX - 11.17.14 Next AEX - 3 Months Please Advise on refills for Dr. Einar Grad

## 2013-10-30 MED ORDER — CLORAZEPATE DIPOTASSIUM 7.5 MG PO TABS: ORAL_TABLET | ORAL | Status: DC

## 2013-10-30 NOTE — Telephone Encounter (Signed)
Rx request phoned to pharmacy/SLS  

## 2013-10-30 NOTE — Telephone Encounter (Signed)
OK to send 30 tabs zero refills.  

## 2013-11-30 ENCOUNTER — Other Ambulatory Visit: Payer: Self-pay | Admitting: Family

## 2013-11-30 NOTE — Telephone Encounter (Signed)
Rx printed and forwarded to Provider for signature.   Medication name:  Name from pharmacy:  clorazepate (TRANXENE) 7.5 MG tablet  CLORAZEPATE 7.5 MG TABLET 7.5 MG TAB Sig: TAKE 1 TABLET BY MOUTH EVERYDAY AT 8:00PM Dispense: 30 tablet Refills: 0 Start: 11/30/2013 Class: Normal Requested on: 11/02/2013 Originally ordered on: 01/02/2012 Last refill: 11/02/2013

## 2014-01-20 ENCOUNTER — Other Ambulatory Visit: Payer: Self-pay | Admitting: Family Medicine

## 2014-01-20 NOTE — Telephone Encounter (Signed)
Please advise Trazodone refill?  Last RX was wrote on 10-27-13 quantity 45 with 2 refills

## 2014-01-21 ENCOUNTER — Ambulatory Visit: Payer: BC Managed Care – PPO | Admitting: Family Medicine

## 2014-01-22 ENCOUNTER — Ambulatory Visit (INDEPENDENT_AMBULATORY_CARE_PROVIDER_SITE_OTHER): Payer: BC Managed Care – PPO | Admitting: Family Medicine

## 2014-01-22 ENCOUNTER — Telehealth: Payer: Self-pay | Admitting: Family Medicine

## 2014-01-22 ENCOUNTER — Encounter: Payer: Self-pay | Admitting: Family Medicine

## 2014-01-22 VITALS — BP 122/76 | HR 64 | Temp 97.9°F | Resp 16 | Ht 68.0 in | Wt 195.5 lb

## 2014-01-22 DIAGNOSIS — K219 Gastro-esophageal reflux disease without esophagitis: Secondary | ICD-10-CM

## 2014-01-22 DIAGNOSIS — Z0000 Encounter for general adult medical examination without abnormal findings: Secondary | ICD-10-CM

## 2014-01-22 DIAGNOSIS — H6690 Otitis media, unspecified, unspecified ear: Secondary | ICD-10-CM

## 2014-01-22 DIAGNOSIS — R222 Localized swelling, mass and lump, trunk: Secondary | ICD-10-CM

## 2014-01-22 DIAGNOSIS — R229 Localized swelling, mass and lump, unspecified: Secondary | ICD-10-CM

## 2014-01-22 DIAGNOSIS — F341 Dysthymic disorder: Secondary | ICD-10-CM

## 2014-01-22 DIAGNOSIS — D693 Immune thrombocytopenic purpura: Secondary | ICD-10-CM

## 2014-01-22 DIAGNOSIS — F418 Other specified anxiety disorders: Secondary | ICD-10-CM

## 2014-01-22 NOTE — Telephone Encounter (Signed)
Lab order week of 08/22/14  Next visit annual/gyn with labs prior to visit, lipid, renal, cbc, tsh, hepatic

## 2014-01-22 NOTE — Progress Notes (Signed)
Pre visit review using our clinic review tool, if applicable. No additional management support is needed unless otherwise documented below in the visit note/SLS  

## 2014-01-22 NOTE — Patient Instructions (Signed)
Daily Salon Pas patches  Costochondritis Costochondritis, sometimes called Tietze syndrome, is a swelling and irritation (inflammation) of the tissue (cartilage) that connects your ribs with your breastbone (sternum). It causes pain in the chest and rib area. Costochondritis usually goes away on its own over time. It can take up to 6 weeks or longer to get better, especially if you are unable to limit your activities. CAUSES  Some cases of costochondritis have no known cause. Possible causes include:  Injury (trauma).  Exercise or activity such as lifting.  Severe coughing. SIGNS AND SYMPTOMS  Pain and tenderness in the chest and rib area.  Pain that gets worse when coughing or taking deep breaths.  Pain that gets worse with specific movements. DIAGNOSIS  Your health care provider will do a physical exam and ask about your symptoms. Chest X-rays or other tests may be done to rule out other problems. TREATMENT  Costochondritis usually goes away on its own over time. Your health care provider may prescribe medicine to help relieve pain. HOME CARE INSTRUCTIONS   Avoid exhausting physical activity. Try not to strain your ribs during normal activity. This would include any activities using chest, abdominal, and side muscles, especially if heavy weights are used.  Apply ice to the affected area for the first 2 days after the pain begins.  Put ice in a plastic bag.  Place a towel between your skin and the bag.  Leave the ice on for 20 minutes, 2 3 times a day.  Only take over-the-counter or prescription medicines as directed by your health care provider. SEEK MEDICAL CARE IF:  You have redness or swelling at the rib joints. These are signs of infection.  Your pain does not go away despite rest or medicine. SEEK IMMEDIATE MEDICAL CARE IF:   Your pain increases or you are very uncomfortable.  You have shortness of breath or difficulty breathing.  You cough up blood.  You have  worse chest pains, sweating, or vomiting.  You have a fever or persistent symptoms for more than 2 3 days.  You have a fever and your symptoms suddenly get worse. MAKE SURE YOU:   Understand these instructions.  Will watch your condition.  Will get help right away if you are not doing well or get worse. Document Released: 08/29/2005 Document Revised: 09/09/2013 Document Reviewed: 06/23/2013 [facility] Patient Information 2014 [place].

## 2014-01-23 NOTE — Telephone Encounter (Signed)
Orders entered as below.

## 2014-01-24 MED ORDER — FLUOXETINE HCL 20 MG PO CAPS: 20.0000 mg | ORAL_CAPSULE | Freq: Every day | ORAL | Status: DC

## 2014-01-24 NOTE — Assessment & Plan Note (Signed)
Stable  controlled

## 2014-01-24 NOTE — Progress Notes (Signed)
[name], female   DOB: [birth year], 58 y.o.   MRN: [id number] Laura Bender [id number] [birth year] 01/24/2014      Progress Note-Follow Up  Subjective  Chief Complaint  Chief Complaint  Patient presents with  . Follow-up    3-mth. [Gerd; Hyperglycemia; ITP; Depression w/Anxiety; Fibromyalgia]    HPI  Patient is a 58 year old Caucasian female who is in today for followup. She was switched to Lexapro but ultimately did not feel like it helped so she went back to Prozac. She feels that has been more helpful. Otherwise feeling fairly well. No recent illness. No chest pain, palpitations or shortness of breath. No GI or GU concerns at this time.  Past Medical History  Diagnosis Date  . Clotting disorder   . Lipoma 03-04-12    Rt. back about scapular ares  . 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  '95- blow normal levels, but stable.  . Fibromyalgia 03-04-12    Sporadic pain  . Depression 03-04-12    tx. meds  . PONV (postoperative nausea and vomiting)   . Allergic state 04/17/2013  . Depression with anxiety 04/17/2013  . Preventative health care 04/17/2013  . GERD (gastroesophageal reflux disease) 08/17/2013  . Solitary pulmonary nodule 08/17/2013    LLL seen on CT in March 2014  . Preventative health care 08/17/2013    Past Surgical History  Procedure Laterality Date  . Splenectomy, total  1995  . Appendectomy  1995  . Irrigation and debridement abscess  03-04-12    x2- buttocks  . Parotid gland tumor excision Left     benign  . Breast surgery  03-04-12    12'12 -left needle biopsy(benign)-Titanium needle marker remains  . Mass excision  03/10/2012    Procedure: EXCISION MASS;  Surgeon: Odis Hollingshead, MD;  Location: [facility];  Service: General;  Laterality: N/A;  Removal of back lipoma  . Abdominal hysterectomy  1995    total, endometriosis, fibroid, ovarian atrophy  . Colonoscopy  2008    [facility], Dr Cristina Gong  . Cholecystectomy N/A 04/24/2013   Procedure: LAPAROSCOPIC CHOLECYSTECTOMY WITH INTRAOPERATIVE CHOLANGIOGRAM;  Surgeon: Haywood Lasso, MD;  Location: [facility];  Service: General;  Laterality: N/A;    Family History  Problem Relation Age of Onset  . Heart disease Maternal Grandmother   . Heart disease Maternal Grandfather   . Heart attack Maternal Grandfather 62  . Heart disease Paternal Grandmother   . Heart attack Paternal Grandmother 86  . Heart disease Paternal Grandfather   . Cancer Paternal Grandfather     chest- smoker  . Diabetes Mother     type 2- controlled by diet  . Atrial fibrillation Father   . Hyperlipidemia Sister   . Depression Sister     History   Social History  . Marital Status: Single    Spouse Name: N/A    Number of Children: N/A  . Years of Education: N/A   Occupational History  . Not on file.   Social History Main Topics  . Smoking status: Former Smoker -- 5 years    Quit date: 02/21/1983  . Smokeless tobacco: Never Used  . Alcohol Use: 0.0 oz/week    3-4 Cans of beer per week     Comment: weekends -beer  . Drug Use: No  . Sexual Activity: No   Other Topics Concern  . Not on file   Social History Narrative   Works at [employer]  Current Outpatient Prescriptions on File Prior to Visit  Medication Sig Dispense Refill  . acetaminophen (TYLENOL) 500 MG tablet Take 1,000 mg by mouth every 6 (six) hours as needed for pain.      Marland Kitchen ALPRAZolam (XANAX) 0.25 MG tablet Take 1 tablet (0.25 mg total) by mouth 2 (two) times daily as needed for sleep or anxiety.  20 tablet  3  . BIOTIN PO Take 1 capsule by mouth daily.      . clorazepate (TRANXENE) 7.5 MG tablet TAKE 1 TABLET BY MOUTH EVERYDAY AT 8:00PM  30 tablet  1  . omeprazole (PRILOSEC) 20 MG capsule Take 1 capsule (20 mg total) by mouth daily.  30 capsule  3  . PREMARIN 1.25 MG tablet TAKE 1 TABLET (1.25 MG TOTAL) BY MOUTH DAILY.  30 tablet  2  . traZODone (DESYREL) 50 MG tablet TAKE 1 & 1/2 TABLET BY MOUTH AT BEDTIME  45  tablet  2   No current facility-administered medications on file prior to visit.    Allergies  Allergen Reactions  . Codeine Nausea And Vomiting    Review of Systems  Review of Systems  Constitutional: Negative for fever and malaise/fatigue.  HENT: Negative for congestion.   Eyes: Negative for discharge.  Respiratory: Negative for shortness of breath.   Cardiovascular: Negative for chest pain, palpitations and leg swelling.  Gastrointestinal: Negative for nausea, abdominal pain and diarrhea.  Genitourinary: Negative for dysuria.  Musculoskeletal: Negative for falls.  Skin: Negative for rash.  Neurological: Negative for loss of consciousness and headaches.  Endo/Heme/Allergies: Negative for polydipsia.  Psychiatric/Behavioral: Negative for depression and suicidal ideas. The patient is nervous/anxious. The patient does not have insomnia.     Objective  BP 122/76  Pulse 64  Temp(Src) 97.9 F (36.6 C) (Oral)  Resp 16  Ht 5\' 8"  (1.727 m)  Wt 195 lb 8 oz (88.678 kg)  BMI 29.73 kg/m2  SpO2 99%  Physical Exam  Physical Exam  Constitutional: She is oriented to person, place, and time and well-developed, well-nourished, and in no distress. No distress.  HENT:  Head: Normocephalic and atraumatic.  Eyes: Conjunctivae are normal.  Neck: Neck supple. No thyromegaly present.  Cardiovascular: Normal rate, regular rhythm and normal heart sounds.   No murmur heard. Pulmonary/Chest: Effort normal and breath sounds normal. She has no wheezes.  Abdominal: She exhibits no distension and no mass.  Musculoskeletal: She exhibits no edema.  Lymphadenopathy:    She has no cervical adenopathy.  Neurological: She is alert and oriented to person, place, and time.  Skin: Skin is warm and dry. No rash noted. She is not diaphoretic.  Psychiatric: Memory, affect and judgment normal.    Lab Results  Component Value Date   TSH 1.800 08/13/2013   Lab Results  Component Value Date   WBC 5.6  10/19/2013   HGB 13.3 10/19/2013   HCT 39.0 10/19/2013   MCV 89.2 10/19/2013   PLT 47* 10/19/2013   Lab Results  Component Value Date   CREATININE 0.81 10/19/2013   BUN 18 10/19/2013   NA 135 10/19/2013   K 4.3 10/19/2013   CL 102 10/19/2013   CO2 29 10/19/2013   Lab Results  Component Value Date   ALT 15 08/13/2013   AST 19 08/13/2013   ALKPHOS 88 08/13/2013   BILITOT 0.3 08/13/2013   Lab Results  Component Value Date   CHOL 185 08/13/2013   Lab Results  Component Value Date   HDL 74 08/13/2013  Lab Results  Component Value Date   LDLCALC 93 08/13/2013   Lab Results  Component Value Date   TRIG 91 08/13/2013   Lab Results  Component Value Date   CHOLHDL 2.5 08/13/2013     Assessment & Plan  Depression with anxiety swithced back to Prozac and will stay with this for now.   GERD (gastroesophageal reflux disease) Improved avoid offending foods. Continue Omeprazole and  Consider probiotics  ITP (idiopathic thrombocytopenic purpura) s/p splenectomy 1995 Stable controlled

## 2014-01-24 NOTE — Assessment & Plan Note (Signed)
swithced back to Prozac and will stay with this for now.

## 2014-01-24 NOTE — Assessment & Plan Note (Addendum)
Improved avoid offending foods. Continue Omeprazole and  Consider probiotics

## 2014-02-01 ENCOUNTER — Telehealth: Payer: Self-pay | Admitting: Family Medicine

## 2014-02-01 DIAGNOSIS — D693 Immune thrombocytopenic purpura: Secondary | ICD-10-CM

## 2014-02-01 DIAGNOSIS — H6690 Otitis media, unspecified, unspecified ear: Secondary | ICD-10-CM

## 2014-02-01 DIAGNOSIS — R222 Localized swelling, mass and lump, trunk: Secondary | ICD-10-CM

## 2014-02-01 NOTE — Telephone Encounter (Signed)
Refill-clorazapate  Message from pharmacy:  Patient would also like a refill on her fluoxetine. We've never filled it, so we would need all prescribing info for it.

## 2014-02-02 MED ORDER — CLORAZEPATE DIPOTASSIUM 7.5 MG PO TABS: 7.5000 mg | ORAL_TABLET | Freq: Every day | ORAL | Status: DC

## 2014-02-02 MED ORDER — FLUOXETINE HCL 20 MG PO CAPS: 20.0000 mg | ORAL_CAPSULE | Freq: Every day | ORAL | Status: DC

## 2014-02-10 ENCOUNTER — Encounter: Payer: Self-pay | Admitting: Physician Assistant

## 2014-02-10 ENCOUNTER — Ambulatory Visit (INDEPENDENT_AMBULATORY_CARE_PROVIDER_SITE_OTHER): Payer: BC Managed Care – PPO | Admitting: Physician Assistant

## 2014-02-10 VITALS — BP 98/68 | HR 68 | Temp 98.1°F | Resp 16 | Ht 68.0 in | Wt 198.0 lb

## 2014-02-10 DIAGNOSIS — R42 Dizziness and giddiness: Secondary | ICD-10-CM

## 2014-02-10 MED ORDER — METHYLPREDNISOLONE ACETATE 40 MG/ML IJ SUSP
40.0000 mg | Freq: Once | INTRAMUSCULAR | Status: AC
  Administered 2014-02-10: 40 mg via INTRAMUSCULAR

## 2014-02-10 MED ORDER — MECLIZINE HCL 25 MG PO TABS: 25.0000 mg | ORAL_TABLET | Freq: Three times a day (TID) | ORAL | Status: DC | PRN

## 2014-02-10 MED ORDER — FLUTICASONE PROPIONATE 50 MCG/ACT NA SUSP: 2.0000 | Freq: Every day | NASAL | Status: DC

## 2014-02-10 NOTE — Patient Instructions (Signed)
Please use Flonase as directed once daily.  Monitor intake of salt.  Take meclizine as directed for dizziness.  Read instruction of Epley Maneuvers to do at home to help with your symptoms.  Continue Claritin daily.  Call or return to clinic if symptoms are not improving.  Vertigo Vertigo means you feel like you or your surroundings are moving when they are not. Vertigo can be dangerous if it occurs when you are at work, driving, or performing difficult activities.  CAUSES  Vertigo occurs when there is a conflict of signals sent to your brain from the visual and sensory systems in your body. There are many different causes of vertigo, including:  Infections, especially in the inner ear.  A bad reaction to a drug or misuse of alcohol and medicines.  Withdrawal from drugs or alcohol.  Rapidly changing positions, such as lying down or rolling over in bed.  A migraine headache.  Decreased blood flow to the brain.  Increased pressure in the brain from a head injury, infection, tumor, or bleeding. SYMPTOMS  You may feel as though the world is spinning around or you are falling to the ground. Because your balance is upset, vertigo can cause nausea and vomiting. You may have involuntary eye movements (nystagmus). DIAGNOSIS  Vertigo is usually diagnosed by physical exam. If the cause of your vertigo is unknown, your caregiver may perform imaging tests, such as an MRI scan (magnetic resonance imaging). TREATMENT  Most cases of vertigo resolve on their own, without treatment. Depending on the cause, your caregiver may prescribe certain medicines. If your vertigo is related to body position issues, your caregiver may recommend movements or procedures to correct the problem. In rare cases, if your vertigo is caused by certain inner ear problems, you may need surgery. HOME CARE INSTRUCTIONS   Follow your caregiver's instructions.  Avoid driving.  Avoid operating heavy machinery.  Avoid performing  any tasks that would be dangerous to you or others during a vertigo episode.  Tell your caregiver if you notice that certain medicines seem to be causing your vertigo. Some of the medicines used to treat vertigo episodes can actually make them worse in some people. SEEK IMMEDIATE MEDICAL CARE IF:   Your medicines do not relieve your vertigo or are making it worse.  You develop problems with talking, walking, weakness, or using your arms, hands, or legs.  You develop severe headaches.  Your nausea or vomiting continues or gets worse.  You develop visual changes.  A family member notices behavioral changes.  Your condition gets worse. MAKE SURE YOU:  Understand these instructions.  Will watch your condition.  Will get help right away if you are not doing well or get worse. Document Released: 08/29/2005 Document Revised: 02/11/2012 Document Reviewed: 06/07/2011 [facility] Patient Information 2014 [place].

## 2014-02-10 NOTE — Progress Notes (Signed)
Pre visit review using our clinic review tool, if applicable. No additional management support is needed unless otherwise documented below in the visit note/SLS  

## 2014-02-11 DIAGNOSIS — R42 Dizziness and giddiness: Secondary | ICD-10-CM | POA: Insufficient documentation

## 2014-02-11 NOTE — Progress Notes (Signed)
Patient presents to clinic today c/o intermittent positional vertigo x2 weeks. Patient states the dizziness occurs when she turned her head too quickly, more commonly when head is turned to the right. Endorses some bilateral ear pressure. Denies ear pain. Has occasional nausea. Denies vomiting. Denies lightheadedness or syncope. Denies headache or change to vision. Denies recent URI symptoms. Denies ever having this problem before. Does have history of allergies. Denies change in hearing or tinnitus.  Past Medical History  Diagnosis Date  . Clotting disorder   . Lipoma 03-04-12    Rt. back about scapular ares  . 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  '95- blow normal levels, but stable.  . Fibromyalgia 03-04-12    Sporadic pain  . Depression 03-04-12    tx. meds  . PONV (postoperative nausea and vomiting)   . Allergic state 04/17/2013  . Depression with anxiety 04/17/2013  . Preventative health care 04/17/2013  . GERD (gastroesophageal reflux disease) 08/17/2013  . Solitary pulmonary nodule 08/17/2013    LLL seen on CT in March 2014  . Preventative health care 08/17/2013    Current Outpatient Prescriptions on File Prior to Visit  Medication Sig Dispense Refill  . acetaminophen (TYLENOL) 500 MG tablet Take 1,000 mg by mouth every 6 (six) hours as needed for pain.      Marland Kitchen ALPRAZolam (XANAX) 0.25 MG tablet Take 1 tablet (0.25 mg total) by mouth 2 (two) times daily as needed for sleep or anxiety.  20 tablet  3  . BIOTIN PO Take 1 capsule by mouth daily.      . clorazepate (TRANXENE) 7.5 MG tablet Take 1 tablet (7.5 mg total) by mouth at bedtime.  30 tablet  1  . FLUoxetine (PROZAC) 20 MG capsule Take 1 capsule (20 mg total) by mouth daily.  30 capsule  3  . omeprazole (PRILOSEC) 20 MG capsule Take 1 capsule (20 mg total) by mouth daily.  30 capsule  3  . PREMARIN 1.25 MG tablet TAKE 1 TABLET (1.25 MG TOTAL) BY MOUTH DAILY.  30 tablet  2  . traZODone (DESYREL) 50 MG tablet TAKE 1 &  1/2 TABLET BY MOUTH AT BEDTIME  45 tablet  2   No current facility-administered medications on file prior to visit.    Allergies  Allergen Reactions  . Codeine Nausea And Vomiting    Family History  Problem Relation Age of Onset  . Heart disease Maternal Grandmother   . Heart disease Maternal Grandfather   . Heart attack Maternal Grandfather 62  . Heart disease Paternal Grandmother   . Heart attack Paternal Grandmother 86  . Heart disease Paternal Grandfather   . Cancer Paternal Grandfather     chest- smoker  . Diabetes Mother     type 2- controlled by diet  . Atrial fibrillation Father   . Hyperlipidemia Sister   . Depression Sister     History   Social History  . Marital Status: Single    Spouse Name: N/A    Number of Children: N/A  . Years of Education: N/A   Social History Main Topics  . Smoking status: Former Smoker -- 5 years    Quit date: 02/21/1983  . Smokeless tobacco: Never Used  . Alcohol Use: 0.0 oz/week    3-4 Cans of beer per week     Comment: weekends -beer  . Drug Use: No  . Sexual Activity: No   Other Topics Concern  . None   Social History  Narrative   Works at [employer]   Review of Systems - See HPI.  All other ROS are negative.  BP 98/68  Pulse 68  Temp(Src) 98.1 F (36.7 C) (Oral)  Resp 16  Ht 5\' 8"  (1.727 m)  Wt 198 lb (89.812 kg)  BMI 30.11 kg/m2  SpO2 98%  Physical Exam  Vitals reviewed. Constitutional: She is oriented to person, place, and time and well-developed, well-nourished, and in no distress.  HENT:  Head: Normocephalic and atraumatic.  Right Ear: Hearing, external ear and ear canal normal.  Left Ear: Hearing, external ear and ear canal normal.  Nose: Nose normal. Right sinus exhibits no maxillary sinus tenderness and no frontal sinus tenderness. Left sinus exhibits no maxillary sinus tenderness and no frontal sinus tenderness.  Mouth/Throat: Uvula is midline, oropharynx is clear and moist and mucous  membranes are normal. No oropharyngeal exudate, posterior oropharyngeal edema, posterior oropharyngeal erythema or tonsillar abscesses.  Fluid bubbles noted behind eardrums bilaterally. Dix-Hallpike maneuver performed, revealing right beating nystagmus.  Eyes: Conjunctivae and EOM are normal. Pupils are equal, round, and reactive to light.  Neck: Neck supple.  Cardiovascular: Normal rate, regular rhythm, normal heart sounds and intact distal pulses.   Pulmonary/Chest: Effort normal and breath sounds normal. No respiratory distress. She has no wheezes. She has no rales. She exhibits no tenderness.  Neurological: She is alert and oriented to person, place, and time.  Skin: Skin is warm and dry. No rash noted.  Psychiatric: Affect normal.   No results found for this or any previous visit (from the past 2160 hour(s)).  Assessment/Plan: Vertigo Positional.  Fluid noted behind eardrums. Rx Flonase. Encourage antihistamine. Antivert for dizziness. Handout was given to patient for Epley Maneuver Exercises. If symptoms persist, will need referral to physical therapy and/or ENT.

## 2014-02-11 NOTE — Assessment & Plan Note (Addendum)
Positional.  Fluid noted behind eardrums. Rx Flonase. Encourage antihistamine. Antivert for dizziness. Handout was given to patient for Epley Maneuver Exercises. If symptoms persist, will need referral to physical therapy and/or ENT.

## 2014-02-19 ENCOUNTER — Other Ambulatory Visit: Payer: Self-pay | Admitting: Family Medicine

## 2014-04-07 ENCOUNTER — Other Ambulatory Visit: Payer: Self-pay | Admitting: Family Medicine

## 2014-04-07 NOTE — Telephone Encounter (Signed)
Medication name:  Name from pharmacy:  clorazepate (TRANXENE) 7.5 MG tablet  CLORAZEPATE 7.5 MG TABLET 7.5 MG TAB Sig: TAKE 1 TABLET BY MOUTH DAILY AT BEDTIME Dispense: 30 tablet Refills: 1 Start: 04/07/2014 Class: Normal Requested on: 02/02/2014 Originally ordered on: 01/02/2012 Last refill: 03/08/2014

## 2014-04-23 ENCOUNTER — Other Ambulatory Visit: Payer: Self-pay | Admitting: Family Medicine

## 2014-06-07 ENCOUNTER — Telehealth: Payer: Self-pay | Admitting: Family Medicine

## 2014-06-07 MED ORDER — CLORAZEPATE DIPOTASSIUM 7.5 MG PO TABS: 7.5000 mg | ORAL_TABLET | Freq: Every day | ORAL | Status: DC

## 2014-06-07 NOTE — Telephone Encounter (Signed)
Refill- clorazepate  [facility]

## 2014-07-19 ENCOUNTER — Other Ambulatory Visit: Payer: Self-pay | Admitting: Family Medicine

## 2014-08-13 ENCOUNTER — Telehealth: Payer: Self-pay | Admitting: Family Medicine

## 2014-08-13 NOTE — Telephone Encounter (Signed)
Pt stated that she was last seen in early spring (02/10/14) for vertigo and states that she's starting to experience it again (dizziness, ear pressure, nausea, and jaw soreness near ears).  She called to inquire about what medication she took before to relieve symptoms.  According to patient's medication list, pt was prescribed meclizine (ANTIVERT) 25 MG tablet 30 tablets and 3 refills.  Pt had not been taking medication and was therefore encouraged to take it given symptoms.  She was also encouraged to take Flonase and Claritin as this may help as well.  She was encouraged to call back if her symptoms fail to improve to schedule an office visit.  Pt stated understanding and agreed.  She stated that she has an upcoming appointment with Dr. Charlett Blake on 08/31/14 and hoped that she could hold out until then.

## 2014-08-13 NOTE — Telephone Encounter (Signed)
Caller name: Melayah Relation to pt: Call back [place]:  Reason for call:  Having issues with ears and vertigo again.  Having dizziness and nausea again.  Would like to speak with RN.

## 2014-08-24 ENCOUNTER — Other Ambulatory Visit (INDEPENDENT_AMBULATORY_CARE_PROVIDER_SITE_OTHER): Payer: BC Managed Care – PPO

## 2014-08-24 ENCOUNTER — Telehealth: Payer: Self-pay | Admitting: Lab

## 2014-08-24 DIAGNOSIS — Z0000 Encounter for general adult medical examination without abnormal findings: Secondary | ICD-10-CM

## 2014-08-24 NOTE — Telephone Encounter (Signed)
I need patient orders and diagnosis code for lab work .

## 2014-08-24 NOTE — Telephone Encounter (Signed)
Lab order placed.

## 2014-08-25 ENCOUNTER — Telehealth: Payer: Self-pay | Admitting: Lab

## 2014-08-25 LAB — CBC
HEMATOCRIT: 38.7 % (ref 36.0–46.0)
HEMOGLOBIN: 12.8 g/dL (ref 12.0–15.0)
MCHC: 32.9 g/dL (ref 30.0–36.0)
MCV: 92.3 fl (ref 78.0–100.0)
RBC: 4.2 Mil/uL (ref 3.87–5.11)
RDW: 14 % (ref 11.5–15.5)
WBC: 5.8 10*3/uL (ref 4.0–10.5)

## 2014-08-25 LAB — LIPID PANEL
CHOLESTEROL: 193 mg/dL (ref 0–200)
HDL: 63.5 mg/dL (ref >39.00)
LDL Cholesterol: 101 mg/dL — ABNORMAL HIGH (ref 0–99)
NONHDL: 129.5
Total CHOL/HDL Ratio: 3
Triglycerides: 142 mg/dL (ref 0.0–149.0)
VLDL: 28.4 mg/dL (ref 0.0–40.0)

## 2014-08-25 LAB — RENAL FUNCTION PANEL
Albumin: 4 g/dL (ref 3.5–5.2)
BUN: 19 mg/dL (ref 6–23)
CHLORIDE: 104 meq/L (ref 96–112)
CO2: 30 meq/L (ref 19–32)
CREATININE: 0.8 mg/dL (ref 0.4–1.2)
Calcium: 9.3 mg/dL (ref 8.4–10.5)
GFR: 75.13 mL/min (ref >60.00)
Glucose, Bld: 92 mg/dL (ref 70–99)
Phosphorus: 3.8 mg/dL (ref 2.3–4.6)
Potassium: 4.2 mEq/L (ref 3.5–5.1)
SODIUM: 137 meq/L (ref 135–145)

## 2014-08-25 LAB — TSH: TSH: 1.56 u[IU]/mL (ref 0.35–4.50)

## 2014-08-25 LAB — HEPATIC FUNCTION PANEL
ALK PHOS: 93 U/L (ref 39–117)
ALT: 19 U/L (ref 0–35)
AST: 25 U/L (ref 0–37)
Albumin: 4 g/dL (ref 3.5–5.2)
Bilirubin, Direct: 0.1 mg/dL (ref 0.0–0.3)
Total Bilirubin: 0.3 mg/dL (ref 0.2–1.2)
Total Protein: 8.2 g/dL (ref 6.0–8.3)

## 2014-08-25 NOTE — Telephone Encounter (Signed)
Dr.Blyth currently out of office, Dr. Birdie Riddle can you verify lab result for patient. She has a critical platelet

## 2014-08-25 NOTE — Telephone Encounter (Signed)
Please call pt regarding her platelet count of 20,000.  She has a history of ITP but most recent platelets have been 40,000.  Please ask if she is experiencing any spontaneous bleeding or headaches- if so, she needs to go to the ER.  She has an appt w/ Dr Charlett Blake for next week but she should probably be seen tomorrow or Friday if able.

## 2014-08-25 NOTE — Telephone Encounter (Signed)
Pt states that she fine.  She denies spontaneous bleeding, headaches, bruising, etc.  She is familiar of what warning signs to look for and knows to go to the ER if she experiences any of these symptoms.  She has an appointment on 08/31/14 at 7:15 am for CPE.    Do we need to bring her in sooner or notify Dr. Marin Olp?  Please advise.

## 2014-08-25 NOTE — Telephone Encounter (Signed)
Re-route to Triage RN

## 2014-08-31 ENCOUNTER — Encounter: Payer: Self-pay | Admitting: Family Medicine

## 2014-08-31 ENCOUNTER — Ambulatory Visit (INDEPENDENT_AMBULATORY_CARE_PROVIDER_SITE_OTHER): Payer: BC Managed Care – PPO | Admitting: Family Medicine

## 2014-08-31 ENCOUNTER — Other Ambulatory Visit: Payer: Self-pay | Admitting: *Deleted

## 2014-08-31 VITALS — BP 102/55 | HR 74 | Temp 98.0°F | Ht 68.0 in | Wt 190.6 lb

## 2014-08-31 DIAGNOSIS — D693 Immune thrombocytopenic purpura: Secondary | ICD-10-CM

## 2014-08-31 DIAGNOSIS — K219 Gastro-esophageal reflux disease without esophagitis: Secondary | ICD-10-CM

## 2014-08-31 DIAGNOSIS — F341 Dysthymic disorder: Secondary | ICD-10-CM

## 2014-08-31 DIAGNOSIS — Z23 Encounter for immunization: Secondary | ICD-10-CM | POA: Diagnosis not present

## 2014-08-31 DIAGNOSIS — Z0000 Encounter for general adult medical examination without abnormal findings: Secondary | ICD-10-CM

## 2014-08-31 DIAGNOSIS — D696 Thrombocytopenia, unspecified: Secondary | ICD-10-CM | POA: Diagnosis not present

## 2014-08-31 DIAGNOSIS — F418 Other specified anxiety disorders: Secondary | ICD-10-CM

## 2014-08-31 DIAGNOSIS — Z7189 Other specified counseling: Secondary | ICD-10-CM

## 2014-08-31 LAB — CBC
HCT: 38.2 % (ref 36.0–46.0)
HEMOGLOBIN: 12.6 g/dL (ref 12.0–15.0)
MCHC: 32.9 g/dL (ref 30.0–36.0)
MCV: 92.9 fl (ref 78.0–100.0)
PLATELETS: 34 10*3/uL — AB (ref 150.0–400.0)
RBC: 4.11 Mil/uL (ref 3.87–5.11)
RDW: 13.8 % (ref 11.5–15.5)
WBC: 5.8 10*3/uL (ref 4.0–10.5)

## 2014-08-31 MED ORDER — TRAZODONE HCL 50 MG PO TABS: 75.0000 mg | ORAL_TABLET | Freq: Every day | ORAL | Status: DC

## 2014-08-31 MED ORDER — CLORAZEPATE DIPOTASSIUM 7.5 MG PO TABS: 7.5000 mg | ORAL_TABLET | Freq: Every day | ORAL | Status: DC

## 2014-08-31 MED ORDER — FLUOXETINE HCL 20 MG PO CAPS: 20.0000 mg | ORAL_CAPSULE | Freq: Every day | ORAL | Status: DC

## 2014-08-31 MED ORDER — OMEPRAZOLE 20 MG PO CPDR: 20.0000 mg | DELAYED_RELEASE_CAPSULE | Freq: Every day | ORAL | Status: DC

## 2014-08-31 NOTE — Patient Instructions (Signed)

## 2014-08-31 NOTE — Progress Notes (Signed)
Pre visit review using our clinic review tool, if applicable. No additional management support is needed unless otherwise documented below in the visit note. 

## 2014-09-01 ENCOUNTER — Encounter: Payer: Self-pay | Admitting: Family

## 2014-09-01 ENCOUNTER — Encounter: Payer: Self-pay | Admitting: Family Medicine

## 2014-09-01 ENCOUNTER — Ambulatory Visit (HOSPITAL_BASED_OUTPATIENT_CLINIC_OR_DEPARTMENT_OTHER): Payer: BC Managed Care – PPO | Admitting: Family

## 2014-09-01 ENCOUNTER — Other Ambulatory Visit (HOSPITAL_BASED_OUTPATIENT_CLINIC_OR_DEPARTMENT_OTHER): Payer: BC Managed Care – PPO | Admitting: Lab

## 2014-09-01 VITALS — BP 132/62 | HR 58 | Temp 98.0°F | Resp 12 | Ht 68.0 in | Wt 188.0 lb

## 2014-09-01 DIAGNOSIS — D693 Immune thrombocytopenic purpura: Secondary | ICD-10-CM

## 2014-09-01 DIAGNOSIS — Z7189 Other specified counseling: Secondary | ICD-10-CM

## 2014-09-01 HISTORY — DX: Other specified counseling: Z71.89

## 2014-09-01 LAB — COMPREHENSIVE METABOLIC PANEL
ALT: 16 U/L (ref 0–35)
AST: 20 U/L (ref 0–37)
Albumin: 4 g/dL (ref 3.5–5.2)
Alkaline Phosphatase: 100 U/L (ref 39–117)
BUN: 17 mg/dL (ref 6–23)
CALCIUM: 9.3 mg/dL (ref 8.4–10.5)
CHLORIDE: 102 meq/L (ref 96–112)
CO2: 29 meq/L (ref 19–32)
Creatinine, Ser: 0.83 mg/dL (ref 0.50–1.10)
Glucose, Bld: 88 mg/dL (ref 70–99)
Potassium: 4.5 mEq/L (ref 3.5–5.3)
Sodium: 137 mEq/L (ref 135–145)
Total Bilirubin: 0.3 mg/dL (ref 0.2–1.2)
Total Protein: 7.3 g/dL (ref 6.0–8.3)

## 2014-09-01 LAB — CBC WITH DIFFERENTIAL (CANCER CENTER ONLY)
BASO#: 0.1 10*3/uL (ref 0.0–0.2)
BASO%: 1.2 % (ref 0.0–2.0)
EOS ABS: 0.1 10*3/uL (ref 0.0–0.5)
EOS%: 1.2 % (ref 0.0–7.0)
HEMATOCRIT: 37.7 % (ref 34.8–46.6)
HGB: 12.5 g/dL (ref 11.6–15.9)
LYMPH#: 2.4 10*3/uL (ref 0.9–3.3)
LYMPH%: 36.4 % (ref 14.0–48.0)
MCH: 30.7 pg (ref 26.0–34.0)
MCHC: 33.2 g/dL (ref 32.0–36.0)
MCV: 93 fL (ref 81–101)
MONO#: 1 10*3/uL — AB (ref 0.1–0.9)
MONO%: 14.5 % — ABNORMAL HIGH (ref 0.0–13.0)
NEUT#: 3.1 10*3/uL (ref 1.5–6.5)
NEUT%: 46.7 % (ref 39.6–80.0)
Platelets: 25 10*3/uL — ABNORMAL LOW (ref 145–400)
RBC: 4.07 10*6/uL (ref 3.70–5.32)
RDW: 13.4 % (ref 11.1–15.7)
WBC: 6.7 10*3/uL (ref 3.9–10.0)

## 2014-09-01 LAB — CHCC SATELLITE - SMEAR

## 2014-09-01 NOTE — Assessment & Plan Note (Signed)
Patient encouraged to maintain heart healthy diet, regular exercise, adequate sleep. Consider daily probiotics. Take medications as prescribed. Labs reviewed with patient. Given flu shot today

## 2014-09-01 NOTE — Progress Notes (Signed)
[place]  Telephone:([phone]) [phone] Fax:(336) [phone]  ID: AFOMIA BLACKLEY OB: December 14, 1955 MR#: [id number] JOI#:[id number] Patient Care Team: [name], MD as PCP - General (Family Medicine) Cleotis Nipper, MD as Consulting Physician (Gastroenterology)  DIAGNOSIS:  Refractory ITP.  Fibromyalgia.  INTERVAL HISTORY: [name] is here today for a follow-up. Her platelets were 20 at her PCP visit last week. Today they are 25. She spotted a little last week after voiding. Other than this she has had no issues with bleeding. She has had no problems with her fibromyalgia. She denies fever, chills, n/v, cough, rash, headaches, dizziness, SOB, chest pain, palpitations, abdominal pain, constipation, diarrhea or blood in stool. She has had no swelling, tenderness, numbness or tingling in her extremities. Her appetite is good and she stays hydrated. Her weight is stable.     CURRENT TREATMENT: Observation  REVIEW OF SYSTEMS: All other 10 point review of systems is negative.   PAST MEDICAL HISTORY: Past Medical History  Diagnosis Date  . Clotting disorder   . Lipoma 03-04-12    Rt. back about scapular ares  . 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  '95- blow normal levels, but stable.  . Fibromyalgia 03-04-12    Sporadic pain  . Depression 03-04-12    tx. meds  . PONV (postoperative nausea and vomiting)   . Allergic state 04/17/2013  . Depression with anxiety 04/17/2013  . Preventative health care 04/17/2013  . GERD (gastroesophageal reflux disease) 08/17/2013  . Solitary pulmonary nodule 08/17/2013    LLL seen on CT in March 2014  . Preventative health care 08/17/2013   PAST SURGICAL HISTORY: Past Surgical History  Procedure Laterality Date  . Splenectomy, total  1995  . Appendectomy  1995  . Irrigation and debridement abscess  03-04-12    x2- buttocks  . Parotid gland tumor excision Left     benign  . Breast surgery  03-04-12    12'12 -left needle  biopsy(benign)-Titanium needle marker remains  . Mass excision  03/10/2012    Procedure: EXCISION MASS;  Surgeon: Odis Hollingshead, MD;  Location: [facility];  Service: General;  Laterality: N/A;  Removal of back lipoma  . Abdominal hysterectomy  1995    total, endometriosis, fibroid, ovarian atrophy  . Colonoscopy  2008    WNL, Dr Cristina Gong  . Cholecystectomy N/A 04/24/2013    Procedure: LAPAROSCOPIC CHOLECYSTECTOMY WITH INTRAOPERATIVE CHOLANGIOGRAM;  Surgeon: Haywood Lasso, MD;  Location: [facility];  Service: General;  Laterality: N/A;   FAMILY HISTORY Family History  Problem Relation Age of Onset  . Heart disease Maternal Grandmother   . Heart disease Maternal Grandfather   . Heart attack Maternal Grandfather 62  . Heart disease Paternal Grandmother   . Heart attack Paternal Grandmother 86  . Heart disease Paternal Grandfather   . Cancer Paternal Grandfather     chest- smoker  . Diabetes Mother     type 2- controlled by diet  . Atrial fibrillation Father   . Hyperlipidemia Sister   . Depression Sister    GYNECOLOGIC HISTORY:  No LMP recorded. Patient has had a hysterectomy.   SOCIAL HISTORY:  History   Social History  . Marital Status: Single    Spouse Name: N/A    Number of Children: N/A  . Years of Education: N/A   Occupational History  . Not on file.   Social History Main Topics  . Smoking status: Former Smoker -- 5 years  Quit date: 02/21/1983  . Smokeless tobacco: Never Used  . Alcohol Use: 0.0 oz/week    3-4 Cans of beer per week     Comment: weekends -beer  . Drug Use: No  . Sexual Activity: No   Other Topics Concern  . Not on file   Social History Narrative   Works at [employer]: <no information>  HEALTH MAINTENANCE: History  Substance Use Topics  . Smoking status: Former Smoker -- 5 years    Quit date: 02/21/1983  . Smokeless tobacco: Never Used  . Alcohol Use: 0.0 oz/week    3-4 Cans of beer per week     Comment:  weekends -beer   Colonoscopy: PAP: Bone density: Lipid panel:  Allergies  Allergen Reactions  . Codeine Nausea And Vomiting   Current Outpatient Prescriptions  Medication Sig Dispense Refill  . acetaminophen (TYLENOL) 500 MG tablet Take 1,000 mg by mouth every 6 (six) hours as needed for pain.      Marland Kitchen BIOTIN PO Take 1 capsule by mouth daily.      . clorazepate (TRANXENE) 7.5 MG tablet Take 1 tablet (7.5 mg total) by mouth at bedtime.  90 tablet  1  . FLUoxetine (PROZAC) 20 MG capsule Take 1 capsule (20 mg total) by mouth daily.  90 capsule  3  . fluticasone (FLONASE) 50 MCG/ACT nasal spray Place 2 sprays into both nostrils daily.  16 g  6  . loratadine (CLARITIN) 10 MG tablet Take 10 mg by mouth daily.      . meclizine (ANTIVERT) 25 MG tablet Take 1 tablet (25 mg total) by mouth 3 (three) times daily as needed for dizziness or nausea.  30 tablet  3  . omeprazole (PRILOSEC) 20 MG capsule Take 1 capsule (20 mg total) by mouth daily.  90 capsule  3  . PREMARIN 1.25 MG tablet TAKE 1 TABLET BY MOUTH DAILY.  30 tablet  2  . traZODone (DESYREL) 50 MG tablet Take 1.5 tablets (75 mg total) by mouth at bedtime.  135 tablet  1  . ALPRAZolam (XANAX) 0.25 MG tablet Take 1 tablet (0.25 mg total) by mouth 2 (two) times daily as needed for sleep or anxiety.  20 tablet  3   No current facility-administered medications for this visit.   OBJECTIVE: Filed Vitals:   09/01/14 1119  BP: 132/62  Pulse: 58  Temp: 98 F (36.7 C)  Resp: 12   Body mass index is 28.59 kg/(m^2). ECOG FS:0 - Asymptomatic Ocular: Sclerae unicteric, pupils equal, round and reactive to light Ear-nose-throat: Oropharynx clear, dentition fair Lymphatic: No cervical or supraclavicular adenopathy Lungs no rales or rhonchi, good excursion bilaterally Heart regular rate and rhythm, no murmur appreciated Abd soft, nontender, positive bowel sounds MSK no focal spinal tenderness, no joint edema Neuro: non-focal, well-oriented,  appropriate affect Breasts: Deferred  LAB RESULTS: CMP     Component Value Date/Time   NA 137 08/24/2014 1709   NA 141 12/10/2008 1014   K 4.2 08/24/2014 1709   K 4.4 12/10/2008 1014   CL 104 08/24/2014 1709   CL 101 12/10/2008 1014   CO2 30 08/24/2014 1709   CO2 29 12/10/2008 1014   GLUCOSE 92 08/24/2014 1709   GLUCOSE 97 12/10/2008 1014   BUN 19 08/24/2014 1709   BUN 16 12/10/2008 1014   CREATININE 0.8 08/24/2014 1709   CREATININE 0.81 10/19/2013 0902   CALCIUM 9.3 08/24/2014 1709   CALCIUM 9.4 12/10/2008 1014  PROT 8.2 08/24/2014 1709   PROT 7.7 12/10/2008 1014   ALBUMIN 4.0 08/24/2014 1709   ALBUMIN 4.0 08/24/2014 1709   AST 25 08/24/2014 1709   AST 20 12/10/2008 1014   ALT 19 08/24/2014 1709   ALT 16 12/10/2008 1014   ALKPHOS 93 08/24/2014 1709   ALKPHOS 92* 12/10/2008 1014   BILITOT 0.3 08/24/2014 1709   BILITOT 0.60 12/10/2008 1014   GFRNONAA >90 04/24/2013 0425   GFRAA >90 04/24/2013 0425   No results found for this basename: SPEP, UPEP,  kappa and lambda light chains   Lab Results  Component Value Date   WBC 6.7 09/01/2014   NEUTROABS 3.1 09/01/2014   HGB 12.5 09/01/2014   HCT 37.7 09/01/2014   MCV 93 09/01/2014   PLT 25* 09/01/2014   No results found for this basename: LABCA2   No components found with this basename: LABCA125   No results found for this basename: INR,  in the last 168 hours  STUDIES: No results found.  ASSESSMENT/PLAN: Ms. Lem is a very pleasant 58 yo female with refractory ITP. She has had her spleen out. We last treated her with Rituxan several years ago when her platelets dropped to 2. She has had only once instance of spotting last week. Her platelets today were 25. She is completely asymptomatic at this time.  Ms. Petion usually likes to come back when she has issues. We will leave that for her to let us know when she needs to see [place].   Eliezer Bottom, NP 09/01/2014 11:57 AM

## 2014-09-01 NOTE — Assessment & Plan Note (Signed)
Doing well on Fluoxetine. Continue same

## 2014-09-01 NOTE — Progress Notes (Signed)
[name], female   DOB: 1956-09-19, 58 y.o.   MRN: [id number] DAYONA SHAHEEN [id number] [birth year] 09/01/2014      Progress Note-Follow Up  Subjective  Chief Complaint  Chief Complaint  Patient presents with  . Annual Exam    physical  . Injections    flu    HPI  Patient is a 58 year old female in today for routine medical care. She exam. No recent illness. She does acknowledge she's had increased fatigue and some low-grade headaches but no significant headache. Has had some mild vertigo and nausea but none today.: Was 2009 with Dr. Ernest Haber. She's had no bleeding gums bloody stool, hematuria despite her low platelet count. Denies CP/palp/SOB/HA/congestion/fevers/GI or GU c/o. Taking meds as prescribed  Past Medical History  Diagnosis Date  . Clotting disorder   . Lipoma 03-04-12    Rt. back about scapular ares  . 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  '95- blow normal levels, but stable.  . Fibromyalgia 03-04-12    Sporadic pain  . Depression 03-04-12    tx. meds  . PONV (postoperative nausea and vomiting)   . Allergic state 04/17/2013  . Depression with anxiety 04/17/2013  . Preventative health care 04/17/2013  . GERD (gastroesophageal reflux disease) 08/17/2013  . Solitary pulmonary nodule 08/17/2013    LLL seen on CT in March 2014  . Preventative health care 08/17/2013  . Advanced care planning/counseling discussion 09/01/2014    Past Surgical History  Procedure Laterality Date  . Splenectomy, total  1995  . Appendectomy  1995  . Irrigation and debridement abscess  03-04-12    x2- buttocks  . Parotid gland tumor excision Left     benign  . Breast surgery  03-04-12    12'12 -left needle biopsy(benign)-Titanium needle marker remains  . Mass excision  03/10/2012    Procedure: EXCISION MASS;  Surgeon: Odis Hollingshead, MD;  Location: [facility];  Service: General;  Laterality: N/A;  Removal of back lipoma  . Abdominal hysterectomy  1995    total,  endometriosis, fibroid, ovarian atrophy  . Colonoscopy  2008    WNL, Dr Cristina Gong  . Cholecystectomy N/A 04/24/2013    Procedure: LAPAROSCOPIC CHOLECYSTECTOMY WITH INTRAOPERATIVE CHOLANGIOGRAM;  Surgeon: Haywood Lasso, MD;  Location: [facility];  Service: General;  Laterality: N/A;    Family History  Problem Relation Age of Onset  . Heart disease Maternal Grandmother   . Heart disease Maternal Grandfather   . Heart attack Maternal Grandfather 62  . Heart disease Paternal Grandmother   . Heart attack Paternal Grandmother 86  . Heart disease Paternal Grandfather   . Cancer Paternal Grandfather     chest- smoker  . Diabetes Mother     type 2- controlled by diet  . Atrial fibrillation Father   . Hyperlipidemia Sister   . Depression Sister     History   Social History  . Marital Status: Single    Spouse Name: N/A    Number of Children: N/A  . Years of Education: N/A   Occupational History  . Not on file.   Social History Main Topics  . Smoking status: Former Smoker -- 5 years    Quit date: 02/21/1983  . Smokeless tobacco: Never Used  . Alcohol Use: 0.0 oz/week    3-4 Cans of beer per week     Comment: weekends -beer  . Drug Use: No  . Sexual Activity: No   Other  Topics Concern  . Not on file   Social History Narrative   Works at [employer]    Current Outpatient Prescriptions on File Prior to Visit  Medication Sig Dispense Refill  . acetaminophen (TYLENOL) 500 MG tablet Take 1,000 mg by mouth every 6 (six) hours as needed for pain.      Marland Kitchen ALPRAZolam (XANAX) 0.25 MG tablet Take 1 tablet (0.25 mg total) by mouth 2 (two) times daily as needed for sleep or anxiety.  20 tablet  3  . BIOTIN PO Take 1 capsule by mouth daily.      . fluticasone (FLONASE) 50 MCG/ACT nasal spray Place 2 sprays into both nostrils daily.  16 g  6  . loratadine (CLARITIN) 10 MG tablet Take 10 mg by mouth daily.      . meclizine (ANTIVERT) 25 MG tablet Take 1 tablet (25 mg total) by mouth 3  (three) times daily as needed for dizziness or nausea.  30 tablet  3  . PREMARIN 1.25 MG tablet TAKE 1 TABLET BY MOUTH DAILY.  30 tablet  2   No current facility-administered medications on file prior to visit.    Allergies  Allergen Reactions  . Codeine Nausea And Vomiting    Review of Systems  Review of Systems  Constitutional: Negative for fever and malaise/fatigue.  HENT: Negative for congestion.   Eyes: Negative for discharge.  Respiratory: Negative for shortness of breath.   Cardiovascular: Negative for chest pain, palpitations and leg swelling.  Gastrointestinal: Negative for nausea, abdominal pain and diarrhea.  Genitourinary: Negative for dysuria.  Musculoskeletal: Negative for falls.  Skin: Negative for rash.  Neurological: Negative for loss of consciousness and headaches.  Endo/Heme/Allergies: Negative for polydipsia.  Psychiatric/Behavioral: Negative for depression and suicidal ideas. The patient is not nervous/anxious and does not have insomnia.     Objective  BP 102/55  Pulse 74  Temp(Src) 98 F (36.7 C) (Oral)  Ht 5\' 8"  (1.727 m)  Wt 190 lb 9.6 oz (86.456 kg)  BMI 28.99 kg/m2  SpO2 99%  Physical Exam  Physical Exam  Constitutional: She is oriented to person, place, and time and well-developed, well-nourished, and in no distress. No distress.  HENT:  Head: Normocephalic and atraumatic.  Eyes: Conjunctivae are normal.  Neck: Neck supple. No thyromegaly present.  Cardiovascular: Normal rate, regular rhythm and normal heart sounds.   No murmur heard. Pulmonary/Chest: Effort normal and breath sounds normal. She has no wheezes.  Abdominal: Soft. Bowel sounds are normal. She exhibits no distension and no mass.  Musculoskeletal: She exhibits no edema.  Lymphadenopathy:    She has no cervical adenopathy.  Neurological: She is alert and oriented to person, place, and time.  Skin: Skin is warm and dry. No rash noted. She is not diaphoretic.  Psychiatric:  Memory, affect and judgment normal.     Lab Results  Component Value Date   TSH 1.56 08/24/2014   Lab Results  Component Value Date   WBC 6.7 09/01/2014   HGB 12.5 09/01/2014   HCT 37.7 09/01/2014   MCV 93 09/01/2014   PLT 25* 09/01/2014   Lab Results  Component Value Date   CREATININE 0.8 08/24/2014   BUN 19 08/24/2014   NA 137 08/24/2014   K 4.2 08/24/2014   CL 104 08/24/2014   CO2 30 08/24/2014   Lab Results  Component Value Date   ALT 19 08/24/2014   AST 25 08/24/2014   ALKPHOS 93 08/24/2014   BILITOT 0.3  08/24/2014   Lab Results  Component Value Date   CHOL 193 08/24/2014   Lab Results  Component Value Date   HDL 63.50 08/24/2014   Lab Results  Component Value Date   LDLCALC 101* 08/24/2014   Lab Results  Component Value Date   TRIG 142.0 08/24/2014   Lab Results  Component Value Date   CHOLHDL 3 08/24/2014     Assessment & Plan  ITP (idiopathic thrombocytopenic purpura) s/p splenectomy 1995 Platelets back up to 34. She is referred back to hematology. Patient is asymptomatic  GERD (gastroesophageal reflux disease) Avoid offending foods, start probiotics. Do not eat large meals in late evening and consider raising head of bed.   Depression with anxiety Doing well on Fluoxetine. Continue same  Advanced care planning/counseling discussion She has HCP and living will will try and bring [place] forms and she is given a copy of the short form if she needs it.  Preventative health care Patient encouraged to maintain heart healthy diet, regular exercise, adequate sleep. Consider daily probiotics. Take medications as prescribed. Labs reviewed with patient. Given flu shot today

## 2014-09-01 NOTE — Assessment & Plan Note (Signed)
Avoid offending foods, start probiotics. Do not eat large meals in late evening and consider raising head of bed.  

## 2014-09-01 NOTE — Assessment & Plan Note (Signed)
She has HCP and living will will try and bring [place] forms and she is given a copy of the short form if she needs it.

## 2014-09-01 NOTE — Assessment & Plan Note (Signed)
Platelets back up to 34. She is referred back to hematology. Patient is asymptomatic

## 2014-12-07 ENCOUNTER — Other Ambulatory Visit: Payer: Self-pay | Admitting: Family Medicine

## 2014-12-08 NOTE — Telephone Encounter (Signed)
Rx for Premarin sent for 90 day supply.  Called patient to verify if patient has had recent Pap.  Patient states that she will get a Pap during her scheduled appointment on 02/24/15.   EAL

## 2015-02-02 ENCOUNTER — Other Ambulatory Visit: Payer: Self-pay | Admitting: Physician Assistant

## 2015-02-02 NOTE — Telephone Encounter (Signed)
Rx request to pharmacy/[facility]  

## 2015-02-24 ENCOUNTER — Ambulatory Visit (INDEPENDENT_AMBULATORY_CARE_PROVIDER_SITE_OTHER): Payer: BLUE CROSS/BLUE SHIELD | Admitting: Family Medicine

## 2015-02-24 ENCOUNTER — Encounter: Payer: Self-pay | Admitting: Family Medicine

## 2015-02-24 ENCOUNTER — Encounter: Payer: Self-pay | Admitting: General Practice

## 2015-02-24 VITALS — BP 116/74 | HR 69 | Temp 98.0°F | Resp 16 | Ht 68.0 in | Wt 191.0 lb

## 2015-02-24 DIAGNOSIS — D696 Thrombocytopenia, unspecified: Secondary | ICD-10-CM

## 2015-02-24 DIAGNOSIS — F418 Other specified anxiety disorders: Secondary | ICD-10-CM

## 2015-02-24 DIAGNOSIS — K219 Gastro-esophageal reflux disease without esophagitis: Secondary | ICD-10-CM

## 2015-02-24 DIAGNOSIS — T7840XD Allergy, unspecified, subsequent encounter: Secondary | ICD-10-CM

## 2015-02-24 DIAGNOSIS — F341 Dysthymic disorder: Secondary | ICD-10-CM | POA: Diagnosis not present

## 2015-02-24 DIAGNOSIS — D693 Immune thrombocytopenic purpura: Secondary | ICD-10-CM

## 2015-02-24 DIAGNOSIS — E663 Overweight: Secondary | ICD-10-CM

## 2015-02-24 DIAGNOSIS — Z0000 Encounter for general adult medical examination without abnormal findings: Secondary | ICD-10-CM

## 2015-02-24 DIAGNOSIS — E785 Hyperlipidemia, unspecified: Secondary | ICD-10-CM

## 2015-02-24 DIAGNOSIS — R42 Dizziness and giddiness: Secondary | ICD-10-CM

## 2015-02-24 DIAGNOSIS — F411 Generalized anxiety disorder: Secondary | ICD-10-CM

## 2015-02-24 MED ORDER — MONTELUKAST SODIUM 10 MG PO TABS: 10.0000 mg | ORAL_TABLET | Freq: Every day | ORAL | Status: DC

## 2015-02-24 MED ORDER — FLUOXETINE HCL 20 MG PO CAPS: 40.0000 mg | ORAL_CAPSULE | Freq: Every day | ORAL | Status: DC

## 2015-02-24 MED ORDER — ALPRAZOLAM 0.25 MG PO TABS: 0.2500 mg | ORAL_TABLET | Freq: Two times a day (BID) | ORAL | Status: DC | PRN

## 2015-02-24 NOTE — Patient Instructions (Signed)

## 2015-02-24 NOTE — Progress Notes (Signed)
Pre visit review using our clinic review tool, if applicable. No additional management support is needed unless otherwise documented below in the visit note. 

## 2015-03-01 ENCOUNTER — Ambulatory Visit: Payer: BC Managed Care – PPO | Admitting: Family Medicine

## 2015-03-06 ENCOUNTER — Encounter: Payer: Self-pay | Admitting: Family Medicine

## 2015-03-06 DIAGNOSIS — E663 Overweight: Secondary | ICD-10-CM | POA: Insufficient documentation

## 2015-03-06 HISTORY — DX: Overweight: E66.3

## 2015-03-06 NOTE — Assessment & Plan Note (Signed)
Continues to be asymptomatic  

## 2015-03-06 NOTE — Progress Notes (Signed)
Laura Bender  [id number] 24-Feb-1956 03/06/2015      Progress Note-Follow Up  Subjective  Chief Complaint  Chief Complaint  Patient presents with  . Follow-up    depression with anxiety; pt states she is under a lot of stress at work    HPI  Patient is a 59 y.o. female in today for routine medical care. Patient is in today for follow-up. Overall is doing fairly well. Acknowledges she's had some increased trouble with congestion with spring allergies. Feels his for ears are plugged but denies pain or drainage. No change in hearing or tinnitus. No fevers or chills. Fluoxetine has been helpful and tolerated better than Lexapro. No recent illness or other acute complaints noted. Denies CP/palp/SOB/HA/congestion/fevers/GI or GU c/o. Taking meds as prescribed  Past Medical History  Diagnosis Date  . Clotting disorder   . Lipoma 03-04-12    Rt. back about scapular ares  . 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  '95- blow normal levels, but stable.  . Fibromyalgia 03-04-12    Sporadic pain  . Depression 03-04-12    tx. meds  . PONV (postoperative nausea and vomiting)   . Allergic state 04/17/2013  . Depression with anxiety 04/17/2013  . Preventative health care 04/17/2013  . GERD (gastroesophageal reflux disease) 08/17/2013  . Solitary pulmonary nodule 08/17/2013    LLL seen on CT in March 2014  . Preventative health care 08/17/2013  . Advanced care planning/counseling discussion 09/01/2014  . Overweight 03/06/2015    Past Surgical History  Procedure Laterality Date  . Splenectomy, total  1995  . Appendectomy  1995  . Irrigation and debridement abscess  03-04-12    x2- buttocks  . Parotid gland tumor excision Left     benign  . Breast surgery  03-04-12    12'12 -left needle biopsy(benign)-Titanium needle marker remains  . Mass excision  03/10/2012    Procedure: EXCISION MASS;  Surgeon: Odis Hollingshead, MD;  Location: [facility];  Service: General;  Laterality: N/A;   Removal of back lipoma  . Abdominal hysterectomy  1995    total, endometriosis, fibroid, ovarian atrophy  . Colonoscopy  2008    WNL, Dr Cristina Gong  . Cholecystectomy N/A 04/24/2013    Procedure: LAPAROSCOPIC CHOLECYSTECTOMY WITH INTRAOPERATIVE CHOLANGIOGRAM;  Surgeon: Haywood Lasso, MD;  Location: [facility];  Service: General;  Laterality: N/A;    Family History  Problem Relation Age of Onset  . Heart disease Maternal Grandmother   . Heart disease Maternal Grandfather   . Heart attack Maternal Grandfather 62  . Heart disease Paternal Grandmother   . Heart attack Paternal Grandmother 86  . Heart disease Paternal Grandfather   . Cancer Paternal Grandfather     chest- smoker  . Diabetes Mother     type 2- controlled by diet  . Atrial fibrillation Father   . Hyperlipidemia Sister   . Depression Sister     History   Social History  . Marital Status: Single    Spouse Name: N/A  . Number of Children: N/A  . Years of Education: N/A   Occupational History  . Not on file.   Social History Main Topics  . Smoking status: Former Smoker -- 5 years    Quit date: 02/21/1983  . Smokeless tobacco: Never Used  . Alcohol Use: 0.0 oz/week    3-4 Cans of beer per week     Comment: weekends -beer  . Drug Use: No  . Sexual  Activity: No   Other Topics Concern  . Not on file   Social History Narrative   Works at [employer]    Current Outpatient Prescriptions on File Prior to Visit  Medication Sig Dispense Refill  . acetaminophen (TYLENOL) 500 MG tablet Take 1,000 mg by mouth every 6 (six) hours as needed for pain.    Marland Kitchen BIOTIN PO Take 1 capsule by mouth daily.    . clorazepate (TRANXENE) 7.5 MG tablet Take 1 tablet (7.5 mg total) by mouth at bedtime. 90 tablet 1  . fluticasone (FLONASE) 50 MCG/ACT nasal spray Place 2 sprays into both nostrils daily. 16 g 6  . loratadine (CLARITIN) 10 MG tablet Take 10 mg by mouth daily.    . meclizine (ANTIVERT) 25 MG tablet TAKE 1 TABLET (25  MG TOTAL) BY MOUTH 3 (THREE) TIMES DAILY AS NEEDED FOR DIZZINESS OR NAUSEA. 30 tablet 0  . omeprazole (PRILOSEC) 20 MG capsule Take 1 capsule (20 mg total) by mouth daily. 90 capsule 3  . PREMARIN 1.25 MG tablet TAKE 1 TABLET BY MOUTH DAILY. 30 tablet 2  . traZODone (DESYREL) 50 MG tablet Take 1.5 tablets (75 mg total) by mouth at bedtime. 135 tablet 1   No current facility-administered medications on file prior to visit.    Allergies  Allergen Reactions  . Codeine Nausea And Vomiting    Review of Systems  Review of Systems  Constitutional: Negative for fever and malaise/fatigue.  HENT: Positive for congestion.   Eyes: Negative for discharge.  Respiratory: Negative for shortness of breath.   Cardiovascular: Negative for chest pain, palpitations and leg swelling.  Gastrointestinal: Negative for nausea, abdominal pain and diarrhea.  Genitourinary: Negative for dysuria.  Musculoskeletal: Negative for falls.  Skin: Negative for rash.  Neurological: Negative for loss of consciousness and headaches.  Endo/Heme/Allergies: Negative for polydipsia.  Psychiatric/Behavioral: Negative for depression and suicidal ideas. The patient is not nervous/anxious and does not have insomnia.     Objective  BP 116/74 mmHg  Pulse 69  Temp(Src) 98 F (36.7 C) (Oral)  Resp 16  Ht 5\' 8"  (1.727 m)  Wt 191 lb (86.637 kg)  BMI 29.05 kg/m2  SpO2 99%  Physical Exam  Physical Exam  Constitutional: She is oriented to person, place, and time and well-developed, well-nourished, and in no distress. No distress.  HENT:  Head: Normocephalic and atraumatic.  TM's dull and retracted  Eyes: Conjunctivae are normal.  Neck: Neck supple. No thyromegaly present.  Cardiovascular: Normal rate, regular rhythm and normal heart sounds.   No murmur heard. Pulmonary/Chest: Effort normal and breath sounds normal. She has no wheezes.  Abdominal: She exhibits no distension and no mass.  Musculoskeletal: She exhibits  no edema.  Lymphadenopathy:    She has no cervical adenopathy.  Neurological: She is alert and oriented to person, place, and time.  Skin: Skin is warm and dry. No rash noted. She is not diaphoretic.  Psychiatric: Memory, affect and judgment normal.    Lab Results  Component Value Date   TSH 1.56 08/24/2014   Lab Results  Component Value Date   WBC 6.7 09/01/2014   HGB 12.5 09/01/2014   HCT 37.7 09/01/2014   MCV 93 09/01/2014   PLT 25* 09/01/2014   Lab Results  Component Value Date   CREATININE 0.83 09/01/2014   BUN 17 09/01/2014   NA 137 09/01/2014   K 4.5 09/01/2014   CL 102 09/01/2014   CO2 29 09/01/2014   Lab Results  Component Value Date   ALT 16 09/01/2014   AST 20 09/01/2014   ALKPHOS 100 09/01/2014   BILITOT 0.3 09/01/2014   Lab Results  Component Value Date   CHOL 193 08/24/2014   Lab Results  Component Value Date   HDL 63.50 08/24/2014   Lab Results  Component Value Date   LDLCALC 101* 08/24/2014   Lab Results  Component Value Date   TRIG 142.0 08/24/2014   Lab Results  Component Value Date   CHOLHDL 3 08/24/2014     Assessment & Plan  GERD (gastroesophageal reflux disease) Avoid offending foods, start probiotics. Do not eat large meals in late evening and consider raising head of bed.    Depression with anxiety Will proceed with Fluoxetine 40 mg daily   Vertigo Flares with spring allergies at times. Consider Bid antihistamines temporarily, Flonase and nasal saline. Maintain adequate hydration   ITP (idiopathic thrombocytopenic purpura) s/p splenectomy 1995 Continues to be asymptomatic   Overweight Encouraged DASH diet, decrease po intake and increase exercise as tolerated. Needs 7-8 hours of sleep nightly. Avoid trans fats, eat small, frequent meals every 4-5 hours with lean proteins, complex carbs and healthy fats. Minimize simple carbs, GMO foods.

## 2015-03-06 NOTE — Assessment & Plan Note (Addendum)
Flares with spring allergies at times. Consider Bid antihistamines temporarily, Flonase and nasal saline. Maintain adequate hydration

## 2015-03-06 NOTE — Assessment & Plan Note (Signed)
Avoid offending foods, start probiotics. Do not eat large meals in late evening and consider raising head of bed.  

## 2015-03-06 NOTE — Assessment & Plan Note (Addendum)
Will proceed with Fluoxetine 40 mg daily

## 2015-03-06 NOTE — Assessment & Plan Note (Signed)
Encouraged DASH diet, decrease po intake and increase exercise as tolerated. Needs 7-8 hours of sleep nightly. Avoid trans fats, eat small, frequent meals every 4-5 hours with lean proteins, complex carbs and healthy fats. Minimize simple carbs, GMO foods. 

## 2015-03-11 ENCOUNTER — Other Ambulatory Visit: Payer: Self-pay | Admitting: Family Medicine

## 2015-03-11 NOTE — Telephone Encounter (Signed)
Rx printed, and faxed to [facility].

## 2015-04-14 ENCOUNTER — Other Ambulatory Visit: Payer: Self-pay | Admitting: Family Medicine

## 2015-04-27 ENCOUNTER — Other Ambulatory Visit: Payer: Self-pay | Admitting: Family Medicine

## 2015-05-23 ENCOUNTER — Other Ambulatory Visit: Payer: Self-pay | Admitting: Family Medicine

## 2015-07-05 ENCOUNTER — Encounter (HOSPITAL_COMMUNITY): Payer: Self-pay | Admitting: Emergency Medicine

## 2015-07-05 ENCOUNTER — Emergency Department (HOSPITAL_COMMUNITY)
Admission: EM | Admit: 2015-07-05 | Discharge: 2015-07-05 | Disposition: A | Discharge status: Home / Self Care | Payer: BLUE CROSS/BLUE SHIELD | Attending: Emergency Medicine | Admitting: Emergency Medicine

## 2015-07-05 ENCOUNTER — Emergency Department (HOSPITAL_COMMUNITY): Payer: BLUE CROSS/BLUE SHIELD

## 2015-07-05 DIAGNOSIS — E663 Overweight: Secondary | ICD-10-CM | POA: Insufficient documentation

## 2015-07-05 DIAGNOSIS — K219 Gastro-esophageal reflux disease without esophagitis: Secondary | ICD-10-CM | POA: Diagnosis not present

## 2015-07-05 DIAGNOSIS — Z862 Personal history of diseases of the blood and blood-forming organs and certain disorders involving the immune mechanism: Secondary | ICD-10-CM | POA: Insufficient documentation

## 2015-07-05 DIAGNOSIS — M25551 Pain in right hip: Secondary | ICD-10-CM

## 2015-07-05 DIAGNOSIS — Z79899 Other long term (current) drug therapy: Secondary | ICD-10-CM | POA: Diagnosis not present

## 2015-07-05 DIAGNOSIS — W19XXXA Unspecified fall, initial encounter: Secondary | ICD-10-CM

## 2015-07-05 DIAGNOSIS — Y9389 Activity, other specified: Secondary | ICD-10-CM | POA: Insufficient documentation

## 2015-07-05 DIAGNOSIS — W01198A Fall on same level from slipping, tripping and stumbling with subsequent striking against other object, initial encounter: Secondary | ICD-10-CM | POA: Diagnosis not present

## 2015-07-05 DIAGNOSIS — Z86018 Personal history of other benign neoplasm: Secondary | ICD-10-CM | POA: Insufficient documentation

## 2015-07-05 DIAGNOSIS — S99921A Unspecified injury of right foot, initial encounter: Secondary | ICD-10-CM | POA: Insufficient documentation

## 2015-07-05 DIAGNOSIS — Y9289 Other specified places as the place of occurrence of the external cause: Secondary | ICD-10-CM | POA: Insufficient documentation

## 2015-07-05 DIAGNOSIS — M79671 Pain in right foot: Secondary | ICD-10-CM

## 2015-07-05 DIAGNOSIS — Z87891 Personal history of nicotine dependence: Secondary | ICD-10-CM | POA: Diagnosis not present

## 2015-07-05 DIAGNOSIS — F418 Other specified anxiety disorders: Secondary | ICD-10-CM | POA: Diagnosis not present

## 2015-07-05 DIAGNOSIS — Y998 Other external cause status: Secondary | ICD-10-CM | POA: Insufficient documentation

## 2015-07-05 DIAGNOSIS — S79911A Unspecified injury of right hip, initial encounter: Secondary | ICD-10-CM | POA: Insufficient documentation

## 2015-07-05 DIAGNOSIS — Z7951 Long term (current) use of inhaled steroids: Secondary | ICD-10-CM | POA: Diagnosis not present

## 2015-07-05 MED ORDER — ONDANSETRON 8 MG PO TBDP
8.0000 mg | ORAL_TABLET | Freq: Once | ORAL | Status: AC
  Administered 2015-07-05: 8 mg via ORAL
  Filled 2015-07-05: qty 1

## 2015-07-05 MED ORDER — HYDROCODONE-ACETAMINOPHEN 5-325 MG PO TABS: 1.0000 | ORAL_TABLET | Freq: Four times a day (QID) | ORAL | Status: DC | PRN

## 2015-07-05 MED ORDER — MORPHINE SULFATE 4 MG/ML IJ SOLN
4.0000 mg | Freq: Once | INTRAMUSCULAR | Status: AC
  Administered 2015-07-05: 4 mg via INTRAMUSCULAR
  Filled 2015-07-05: qty 1

## 2015-07-05 MED ORDER — ONDANSETRON HCL 4 MG PO TABS: 4.0000 mg | ORAL_TABLET | Freq: Four times a day (QID) | ORAL | Status: DC

## 2015-07-05 NOTE — ED Provider Notes (Signed)
CSN: [id number]   Arrival date & time 07/05/15 1801  History  This chart was scribed for  Laura Etienne, DO by Altamease Oiler, ED Scribe. This patient was seen in room WTR6/WTR6 and the patient's care was started at 6:49 PM.  Chief Complaint  Patient presents with  . Fall    HPI The history is provided by the patient. No language interpreter was used.   Laura Bender is a 59 y.o. female who presents to the Emergency Department complaining of a fall this evening. The pt fell backwards to her buttocks after tripping over a cable. Associated symptoms include constant and severe pain at the right hip and right foot. She has been ambulatory since falling with increased pain. Pt denies knee pain and other injury. No LOC.    Past Medical History  Diagnosis Date  . Clotting disorder   . Lipoma 03-04-12    Rt. back about scapular ares  . 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  '95- blow normal levels, but stable.  . Fibromyalgia 03-04-12    Sporadic pain  . Depression 03-04-12    tx. meds  . PONV (postoperative nausea and vomiting)   . Allergic state 04/17/2013  . Depression with anxiety 04/17/2013  . Preventative health care 04/17/2013  . GERD (gastroesophageal reflux disease) 08/17/2013  . Solitary pulmonary nodule 08/17/2013    LLL seen on CT in March 2014  . Preventative health care 08/17/2013  . Advanced care planning/counseling discussion 09/01/2014  . Overweight 03/06/2015    Past Surgical History  Procedure Laterality Date  . Splenectomy, total  1995  . Appendectomy  1995  . Irrigation and debridement abscess  03-04-12    x2- buttocks  . Parotid gland tumor excision Left     benign  . Breast surgery  03-04-12    12'12 -left needle biopsy(benign)-Titanium needle marker remains  . Mass excision  03/10/2012    Procedure: EXCISION MASS;  Surgeon: Odis Hollingshead, MD;  Location: [facility];  Service: General;  Laterality: N/A;  Removal of back lipoma  . Abdominal hysterectomy   1995    total, endometriosis, fibroid, ovarian atrophy  . Colonoscopy  2008    [facility], Dr Cristina Gong  . Cholecystectomy N/A 04/24/2013    Procedure: LAPAROSCOPIC CHOLECYSTECTOMY WITH INTRAOPERATIVE CHOLANGIOGRAM;  Surgeon: Haywood Lasso, MD;  Location: [facility];  Service: General;  Laterality: N/A;    Family History  Problem Relation Age of Onset  . Heart disease Maternal Grandmother   . Heart disease Maternal Grandfather   . Heart attack Maternal Grandfather 62  . Heart disease Paternal Grandmother   . Heart attack Paternal Grandmother 86  . Heart disease Paternal Grandfather   . Cancer Paternal Grandfather     chest- smoker  . Diabetes Mother     type 2- controlled by diet  . Atrial fibrillation Father   . Hyperlipidemia Sister   . Depression Sister     History  Substance Use Topics  . Smoking status: Former Smoker -- 5 years    Quit date: 02/21/1983  . Smokeless tobacco: Never Used  . Alcohol Use: 0.0 oz/week    3-4 Cans of beer per week     Comment: weekends -beer     Review of Systems  Musculoskeletal: Negative for back pain.       Right hip pain  Right foot pain  Neurological: Negative for syncope.     Home Medications   Prior to Admission medications  Medication Sig Start Date End Date Taking? Authorizing Provider  acetaminophen (TYLENOL) 500 MG tablet Take 1,000 mg by mouth every 6 (six) hours as needed for pain.    Historical Provider, MD  ALPRAZolam Duanne Moron) 0.25 MG tablet Take 1 tablet (0.25 mg total) by mouth 2 (two) times daily as needed for sleep or anxiety. 02/24/15   [name], MD  BIOTIN PO Take 1 capsule by mouth daily.    Historical Provider, MD  clorazepate (TRANXENE) 7.5 MG tablet Take 1 tablet (7.5 mg total) by mouth at bedtime. 03/11/15   [name], MD  FLUoxetine (PROZAC) 20 MG capsule Take 2 capsules (40 mg total) by mouth daily. 02/24/15   [name], MD  fluticasone (FLONASE) 50 MCG/ACT nasal spray Place 2 sprays into both nostrils  daily. 02/10/14   Brunetta Jeans, PA-C  loratadine (CLARITIN) 10 MG tablet Take 10 mg by mouth daily.    Historical Provider, MD  meclizine (ANTIVERT) 25 MG tablet TAKE 1 TABLET (25 MG) BY MOUTH 3 TIMES A DAY AS NEEDED FOR DIZZINESS OR NAUSEA. 05/23/15   [name], MD  montelukast (SINGULAIR) 10 MG tablet Take 1 tablet (10 mg total) by mouth at bedtime. 02/24/15   [name], MD  omeprazole (PRILOSEC) 20 MG capsule Take 1 capsule (20 mg total) by mouth daily. 08/31/14   [name], MD  PREMARIN 1.25 MG tablet TAKE 1 TABLET BY MOUTH DAILY. 05/23/15   [name], MD  traZODone (DESYREL) 50 MG tablet TAKE 1 AND 1/2 TABLETS (75 MG TOTAL) BY MOUTH AT BEDTIME. 04/27/15   [name], MD    Allergies  Codeine  Triage Vitals: BP 174/95 mmHg  Pulse 92  Temp(Src) 97.8 F (36.6 C) (Oral)  Resp 18  Wt 195 lb (88.451 kg)  SpO2 100%  Physical Exam  Constitutional: She is oriented to person, place, and time. She appears well-developed and well-nourished. No distress.  HENT:  Head: Normocephalic and atraumatic.  Eyes: Conjunctivae and EOM are normal.  Neck: Neck supple. No tracheal deviation present.  Cardiovascular: Normal rate, regular rhythm and normal heart sounds.   Pulmonary/Chest: Effort normal and breath sounds normal. No respiratory distress. She has no wheezes.  Musculoskeletal: Normal range of motion.  Right hip mildly tender to palpation, ROM and strength limited secondary to pain, patient able to ambulate with antalgic gait Mild right ankle and foot pain, no bony abnormality or deformity  Neurological: She is alert and oriented to person, place, and time.  Sensation intact  Skin: Skin is warm and dry.  Psychiatric: She has a normal mood and affect. Her behavior is normal.  Nursing note and vitals reviewed.   ED Course  Procedures   DIAGNOSTIC STUDIES: Oxygen Saturation is 100% on RA, normal by my interpretation.    COORDINATION OF CARE: 6:53 PM Discussed  treatment plan which includes right hip XR, right foot XR, morphine, and Zofran with pt at bedside and pt agreed to plan.  Labs Review- Labs Reviewed - No data to display  Imaging Review Dg Foot Complete Right  07/05/2015   CLINICAL DATA:  Pt states she was walking onto a drop trailer and trailer door got caught under trailer door. Pt states when foot got caught underneath door there was a cable that wrapped around her ankle and she fell back onto buttocks. Pt denies hitting head and no prior injury to rt. foot or rt. Hip  EXAM: RIGHT FOOT COMPLETE -  3+ VIEW  COMPARISON:  None.  FINDINGS: No fracture.  No dislocation.  No significant arthropathic change.  Small plantar and dorsal calcaneal spurs.  Soft tissues are unremarkable.  IMPRESSION: No fracture or acute finding.   Electronically Signed   By: Lajean Manes M.D.   On: 07/05/2015 19:46   Dg Hip Unilat With Pelvis 2-3 Views Right  07/05/2015   CLINICAL DATA:  [place] tonight and injured right hip.  EXAM: DG HIP (WITH OR WITHOUT PELVIS) 2-3V RIGHT  COMPARISON:  None.  FINDINGS: Both hips are normally located. No acute hip fracture. The pubic symphysis and SI joints are intact. No definite pelvic fractures.  IMPRESSION: No acute bony findings.   Electronically Signed   By: Marijo Sanes M.D.   On: 07/05/2015 19:45    EKG Interpretation None      MDM   Final diagnoses:  Fall  Hip pain, right  Right foot pain   Patient with mechanical fall and hip pain and foot pain.  Able to ambulate, though very painful.  Will check plain films, treat pain and reassess.  Plain films are negative.  Patient feels slightly better after pain meds.  Will give crutches, ankle brace, and pain meds for home.  Doubt occult fx as the patient is able to stand and walk.  Recommend PCP follow-up.  I personally performed the services described in this documentation, which was scribed in my presence. The recorded information has been reviewed and is  accurate.      Montine Circle, PA-C 07/06/15 [place], DO 07/06/15 2059

## 2015-07-05 NOTE — Progress Notes (Signed)
Orthopedic Tech Progress Note Patient Details:  Laura Bender September 07, 1956 [id number]  Ortho Devices Type of Ortho Device: ASO, Crutches Ortho Device/Splint Interventions: Application   Cammer, Theodoro Parma 07/05/2015, 8:36 PM

## 2015-07-05 NOTE — ED Notes (Signed)
Patient transported to X-ray 

## 2015-07-05 NOTE — Discharge Instructions (Signed)
Arthralgia °Your caregiver has diagnosed you as suffering from an arthralgia. Arthralgia means there is pain in a joint. This can come from many reasons including: °· Bruising the joint which causes soreness (inflammation) in the joint. °· Wear and tear on the joints which occur as we grow older (osteoarthritis). °· Overusing the joint. °· Various forms of arthritis. °· Infections of the joint. °Regardless of the cause of pain in your joint, most of these different pains respond to anti-inflammatory drugs and rest. The exception to this is when a joint is infected, and these cases are treated with antibiotics, if it is a bacterial infection. °HOME CARE INSTRUCTIONS  °· Rest the injured area for as long as directed by your caregiver. Then slowly start using the joint as directed by your caregiver and as the pain allows. Crutches as directed may be useful if the ankles, knees or hips are involved. If the knee was splinted or casted, continue use and care as directed. If an stretchy or elastic wrapping bandage has been applied today, it should be removed and re-applied every 3 to 4 hours. It should not be applied tightly, but firmly enough to keep swelling down. Watch toes and feet for swelling, bluish discoloration, coldness, numbness or excessive pain. If any of these problems (symptoms) occur, remove the ace bandage and re-apply more loosely. If these symptoms persist, contact your caregiver or return to this location. °· For the first 24 hours, keep the injured extremity elevated on pillows while lying down. °· Apply ice for 15-20 minutes to the sore joint every couple hours while awake for the first half day. Then 03-04 times per day for the first 48 hours. Put the ice in a plastic bag and place a towel between the bag of ice and your skin. °· Wear any splinting, casting, elastic bandage applications, or slings as instructed. °· Only take over-the-counter or prescription medicines for pain, discomfort, or fever as  directed by your caregiver. Do not use aspirin immediately after the injury unless instructed by your physician. Aspirin can cause increased bleeding and bruising of the tissues. °· If you were given crutches, continue to use them as instructed and do not resume weight bearing on the sore joint until instructed. °Persistent pain and inability to use the sore joint as directed for more than 2 to 3 days are warning signs indicating that you should see a caregiver for a follow-up visit as soon as possible. Initially, a hairline fracture (break in bone) may not be evident on X-rays. Persistent pain and swelling indicate that further evaluation, non-weight bearing or use of the joint (use of crutches or slings as instructed), or further X-rays are indicated. X-rays may sometimes not show a small fracture until a week or 10 days later. Make a follow-up appointment with your own caregiver or one to whom we have referred you. A radiologist (specialist in reading X-rays) may read your X-rays. Make sure you know how you are to obtain your X-ray results. Do not assume everything is normal if you do not hear from us. °SEEK MEDICAL CARE IF: °Bruising, swelling, or pain increases. °SEEK IMMEDIATE MEDICAL CARE IF:  °· Your fingers or toes are numb or blue. °· The pain is not responding to medications and continues to stay the same or get worse. °· The pain in your joint becomes severe. °· You develop a fever over 102° F (38.9° C). °· It becomes impossible to move or use the joint. °MAKE SURE YOU:  °·   Understand these instructions.  Will watch your condition.  Will get help right away if you are not doing well or get worse. Document Released: 11/19/2005 Document Revised: 02/11/2012 Document Reviewed: 07/07/2008 [facility] Patient Information 2015 [place], [place]. This information is not intended to replace advice given to you by your health care provider. Make sure you discuss any questions you have with your health care  provider. Fall Prevention and Home Safety Falls cause injuries and can affect all age groups. It is possible to use preventive measures to significantly decrease the likelihood of falls. There are many simple measures which can make your home safer and prevent falls. OUTDOORS  Repair cracks and edges of walkways and driveways.  Remove high doorway thresholds.  Trim shrubbery on the main path into your home.  Have good outside lighting.  Clear walkways of tools, rocks, debris, and clutter.  Check that handrails are not broken and are securely fastened. Both sides of steps should have handrails.  Have leaves, snow, and ice cleared regularly.  Use sand or salt on walkways during winter months.  In the garage, clean up grease or oil spills. BATHROOM  Install night lights.  Install grab bars by the toilet and in the tub and shower.  Use non-skid mats or decals in the tub or shower.  Place a plastic non-slip stool in the shower to sit on, if needed.  Keep floors dry and clean up all water on the floor immediately.  Remove soap buildup in the tub or shower on a regular basis.  Secure bath mats with non-slip, double-sided rug tape.  Remove throw rugs and tripping hazards from the floors. BEDROOMS  Install night lights.  Make sure a bedside light is easy to reach.  Do not use oversized bedding.  Keep a telephone by your bedside.  Have a firm chair with side arms to use for getting dressed.  Remove throw rugs and tripping hazards from the floor. KITCHEN  Keep handles on pots and pans turned toward the center of the stove. Use back burners when possible.  Clean up spills quickly and allow time for drying.  Avoid walking on wet floors.  Avoid hot utensils and knives.  Position shelves so they are not too high or low.  Place commonly used objects within easy reach.  If necessary, use a sturdy step stool with a grab bar when reaching.  Keep electrical cables out  of the way.  Do not use floor polish or wax that makes floors slippery. If you must use wax, use non-skid floor wax.  Remove throw rugs and tripping hazards from the floor. STAIRWAYS  Never leave objects on stairs.  Place handrails on both sides of stairways and use them. Fix any loose handrails. Make sure handrails on both sides of the stairways are as long as the stairs.  Check carpeting to make sure it is firmly attached along stairs. Make repairs to worn or loose carpet promptly.  Avoid placing throw rugs at the top or bottom of stairways, or properly secure the rug with carpet tape to prevent slippage. Get rid of throw rugs, if possible.  Have an electrician put in a light switch at the top and bottom of the stairs. OTHER FALL PREVENTION TIPS  Wear low-heel or rubber-soled shoes that are supportive and fit well. Wear closed toe shoes.  When using a stepladder, make sure it is fully opened and both spreaders are firmly locked. Do not climb a closed stepladder.  Add color or  contrast paint or tape to grab bars and handrails in your home. Place contrasting color strips on first and last steps.  Learn and use mobility aids as needed. Install an electrical emergency response system.  Turn on lights to avoid dark areas. Replace light bulbs that burn out immediately. Get light switches that glow.  Arrange furniture to create clear pathways. Keep furniture in the same place.  Firmly attach carpet with non-skid or double-sided tape.  Eliminate uneven floor surfaces.  Select a carpet pattern that does not visually hide the edge of steps.  Be aware of all pets. OTHER HOME SAFETY TIPS  Set the water temperature for 120 F (48.8 C).  Keep emergency numbers on or near the telephone.  Keep smoke detectors on every level of the home and near sleeping areas. Document Released: 11/09/2002 Document Revised: 05/20/2012 Document Reviewed: 02/08/2012 [facility] Patient Information 2015  [place], [place]. This information is not intended to replace advice given to you by your health care provider. Make sure you discuss any questions you have with your health care provider. Hip Pain Your hip is the joint between your upper legs and your lower pelvis. The bones, cartilage, tendons, and muscles of your hip joint perform a lot of work each day supporting your body weight and allowing you to move around. Hip pain can range from a minor ache to severe pain in one or both of your hips. Pain may be felt on the inside of the hip joint near the groin, or the outside near the buttocks and upper thigh. You may have swelling or stiffness as well.  HOME CARE INSTRUCTIONS   Take medicines only as directed by your health care provider.  Apply ice to the injured area:  Put ice in a plastic bag.  Place a towel between your skin and the bag.  Leave the ice on for 15-20 minutes at a time, 3-4 times a day.  Keep your leg raised (elevated) when possible to lessen swelling.  Avoid activities that cause pain.  Follow specific exercises as directed by your health care provider.  Sleep with a pillow between your legs on your most comfortable side.  Record how often you have hip pain, the location of the pain, and what it feels like. SEEK MEDICAL CARE IF:   You are unable to put weight on your leg.  Your hip is red or swollen or very tender to touch.  Your pain or swelling continues or worsens after 1 week.  You have increasing difficulty walking.  You have a fever. SEEK IMMEDIATE MEDICAL CARE IF:   You have fallen.  You have a sudden increase in pain and swelling in your hip. MAKE SURE YOU:   Understand these instructions.  Will watch your condition.  Will get help right away if you are not doing well or get worse. Document Released: 05/09/2010 Document Revised: 04/05/2014 Document Reviewed: 07/16/2013 [facility] Patient Information 2015 [place], [place]. This information is not  intended to replace advice given to you by your health care provider. Make sure you discuss any questions you have with your health care provider.

## 2015-07-05 NOTE — ED Notes (Signed)
Questions r/t dc were denied. Pt awaiting ortho tech. Pt a&ox4

## 2015-07-05 NOTE — ED Notes (Signed)
Pt complaint of right ankle pain and buttocks pain post tangled in cable around ankle and fall to buttock.

## 2015-09-26 ENCOUNTER — Other Ambulatory Visit: Payer: Self-pay | Admitting: Family Medicine

## 2015-09-26 NOTE — Telephone Encounter (Signed)
Faxed hardcopy for Clorazepate to [employer].

## 2015-09-29 ENCOUNTER — Other Ambulatory Visit (INDEPENDENT_AMBULATORY_CARE_PROVIDER_SITE_OTHER): Payer: BLUE CROSS/BLUE SHIELD

## 2015-09-29 DIAGNOSIS — T7840XD Allergy, unspecified, subsequent encounter: Secondary | ICD-10-CM | POA: Diagnosis not present

## 2015-09-29 DIAGNOSIS — D696 Thrombocytopenia, unspecified: Secondary | ICD-10-CM | POA: Diagnosis not present

## 2015-09-29 DIAGNOSIS — E785 Hyperlipidemia, unspecified: Secondary | ICD-10-CM

## 2015-09-29 DIAGNOSIS — Z0000 Encounter for general adult medical examination without abnormal findings: Secondary | ICD-10-CM

## 2015-09-29 LAB — COMPREHENSIVE METABOLIC PANEL
ALT: 12 U/L (ref 0–35)
AST: 16 U/L (ref 0–37)
Albumin: 4 g/dL (ref 3.5–5.2)
Alkaline Phosphatase: 100 U/L (ref 39–117)
BUN: 18 mg/dL (ref 6–23)
CALCIUM: 9.6 mg/dL (ref 8.4–10.5)
CHLORIDE: 102 meq/L (ref 96–112)
CO2: 30 mEq/L (ref 19–32)
Creatinine, Ser: 0.84 mg/dL (ref 0.40–1.20)
GFR: 73.82 mL/min (ref >60.00)
GLUCOSE: 88 mg/dL (ref 70–99)
Potassium: 3.9 mEq/L (ref 3.5–5.1)
Sodium: 139 mEq/L (ref 135–145)
Total Bilirubin: 0.4 mg/dL (ref 0.2–1.2)
Total Protein: 8 g/dL (ref 6.0–8.3)

## 2015-09-29 LAB — LIPID PANEL
CHOL/HDL RATIO: 3
Cholesterol: 207 mg/dL — ABNORMAL HIGH (ref 0–200)
HDL: 65.9 mg/dL (ref >39.00)
LDL CALC: 115 mg/dL — AB (ref 0–99)
NonHDL: 141.44
TRIGLYCERIDES: 130 mg/dL (ref 0.0–149.0)
VLDL: 26 mg/dL (ref 0.0–40.0)

## 2015-09-29 LAB — CBC
HCT: 38.7 % (ref 36.0–46.0)
HEMOGLOBIN: 12.6 g/dL (ref 12.0–15.0)
MCHC: 32.7 g/dL (ref 30.0–36.0)
MCV: 92.6 fl (ref 78.0–100.0)
Platelets: 51 10*3/uL — ABNORMAL LOW (ref 150.0–400.0)
RBC: 4.18 Mil/uL (ref 3.87–5.11)
RDW: 14.3 % (ref 11.5–15.5)
WBC: 6.8 10*3/uL (ref 4.0–10.5)

## 2015-09-29 LAB — TSH: TSH: 1.67 u[IU]/mL (ref 0.35–4.50)

## 2015-10-06 ENCOUNTER — Telehealth: Payer: Self-pay | Admitting: Behavioral Health

## 2015-10-06 ENCOUNTER — Encounter: Payer: BLUE CROSS/BLUE SHIELD | Admitting: Family Medicine

## 2015-10-06 ENCOUNTER — Encounter: Payer: Self-pay | Admitting: Behavioral Health

## 2015-10-06 NOTE — Telephone Encounter (Signed)
Pre-Visit Call completed with patient and chart updated.   Pre-Visit Info documented in Specialty Comments under [employer].    

## 2015-10-07 ENCOUNTER — Ambulatory Visit (INDEPENDENT_AMBULATORY_CARE_PROVIDER_SITE_OTHER): Payer: BLUE CROSS/BLUE SHIELD | Admitting: Family Medicine

## 2015-10-07 ENCOUNTER — Encounter: Payer: Self-pay | Admitting: Family Medicine

## 2015-10-07 VITALS — BP 120/70 | HR 70 | Temp 98.1°F | Ht 68.5 in | Wt 189.4 lb

## 2015-10-07 DIAGNOSIS — Z780 Asymptomatic menopausal state: Secondary | ICD-10-CM | POA: Diagnosis not present

## 2015-10-07 DIAGNOSIS — D693 Immune thrombocytopenic purpura: Secondary | ICD-10-CM

## 2015-10-07 DIAGNOSIS — Z0000 Encounter for general adult medical examination without abnormal findings: Secondary | ICD-10-CM | POA: Diagnosis not present

## 2015-10-07 DIAGNOSIS — Z1159 Encounter for screening for other viral diseases: Secondary | ICD-10-CM

## 2015-10-07 DIAGNOSIS — E782 Mixed hyperlipidemia: Secondary | ICD-10-CM

## 2015-10-07 DIAGNOSIS — D696 Thrombocytopenia, unspecified: Secondary | ICD-10-CM | POA: Diagnosis not present

## 2015-10-07 DIAGNOSIS — Z1239 Encounter for other screening for malignant neoplasm of breast: Secondary | ICD-10-CM | POA: Diagnosis not present

## 2015-10-07 DIAGNOSIS — E663 Overweight: Secondary | ICD-10-CM

## 2015-10-07 DIAGNOSIS — IMO0001 Reserved for inherently not codable concepts without codable children: Secondary | ICD-10-CM

## 2015-10-07 DIAGNOSIS — M609 Myositis, unspecified: Secondary | ICD-10-CM

## 2015-10-07 DIAGNOSIS — M791 Myalgia: Secondary | ICD-10-CM

## 2015-10-07 NOTE — Patient Instructions (Signed)
Preventive Care for Adults, Female A healthy lifestyle and preventive care can promote health and wellness. Preventive health guidelines for women include the following key practices.  A routine yearly physical is a good way to check with your health care provider about your health and preventive screening. It is a chance to share any concerns and updates on your health and to receive a thorough exam.  Visit your dentist for a routine exam and preventive care every 6 months. Brush your teeth twice a day and floss once a day. Good oral hygiene prevents tooth decay and gum disease.  The frequency of eye exams is based on your age, health, family medical history, use of contact lenses, and other factors. Follow your health care provider's recommendations for frequency of eye exams.  Eat a healthy diet. Foods like vegetables, fruits, whole grains, low-fat dairy products, and lean protein foods contain the nutrients you need without too many calories. Decrease your intake of foods high in solid fats, added sugars, and salt. Eat the right amount of calories for you.Get information about a proper diet from your health care provider, if necessary.  Regular physical exercise is one of the most important things you can do for your health. Most adults should get at least 150 minutes of moderate-intensity exercise (any activity that increases your heart rate and causes you to sweat) each week. In addition, most adults need muscle-strengthening exercises on 2 or more days a week.  Maintain a healthy weight. The body mass index (BMI) is a screening tool to identify possible weight problems. It provides an estimate of body fat based on height and weight. Your health care provider can find your BMI and can help you achieve or maintain a healthy weight.For adults 20 years and older:  A BMI below 18.5 is considered underweight.  A BMI of 18.5 to 24.9 is normal.  A BMI of 25 to 29.9 is considered overweight.  A  BMI of 30 and above is considered obese.  Maintain normal blood lipids and cholesterol levels by exercising and minimizing your intake of saturated fat. Eat a balanced diet with plenty of fruit and vegetables. Blood tests for lipids and cholesterol should begin at age 45 and be repeated every 5 years. If your lipid or cholesterol levels are high, you are over 50, or you are at high risk for heart disease, you may need your cholesterol levels checked more frequently.Ongoing high lipid and cholesterol levels should be treated with medicines if diet and exercise are not working.  If you smoke, find out from your health care provider how to quit. If you do not use tobacco, do not start.  Lung cancer screening is recommended for adults aged 45-80 years who are at high risk for developing lung cancer because of a history of smoking. A yearly low-dose CT scan of the lungs is recommended for people who have at least a 30-pack-year history of smoking and are a current smoker or have quit within the past 15 years. A pack year of smoking is smoking an average of 1 pack of cigarettes a day for 1 year (for example: 1 pack a day for 30 years or 2 packs a day for 15 years). Yearly screening should continue until the smoker has stopped smoking for at least 15 years. Yearly screening should be stopped for people who develop a health problem that would prevent them from having lung cancer treatment.  If you are pregnant, do not drink alcohol. If you are  breastfeeding, be very cautious about drinking alcohol. If you are not pregnant and choose to drink alcohol, do not have more than 1 drink per day. One drink is considered to be 12 ounces (355 mL) of beer, 5 ounces (148 mL) of wine, or 1.5 ounces (44 mL) of liquor.  Avoid use of street drugs. Do not share needles with anyone. Ask for help if you need support or instructions about stopping the use of drugs.  High blood pressure causes heart disease and increases the risk  of stroke. Your blood pressure should be checked at least every 1 to 2 years. Ongoing high blood pressure should be treated with medicines if weight loss and exercise do not work.  If you are 55-79 years old, ask your health care provider if you should take aspirin to prevent strokes.  Diabetes screening is done by taking a blood sample to check your blood glucose level after you have not eaten for a certain period of time (fasting). If you are not overweight and you do not have risk factors for diabetes, you should be screened once every 3 years starting at age 45. If you are overweight or obese and you are 40-70 years of age, you should be screened for diabetes every year as part of your cardiovascular risk assessment.  Breast cancer screening is essential preventive care for women. You should practice "breast self-awareness." This means understanding the normal appearance and feel of your breasts and may include breast self-examination. Any changes detected, no matter how small, should be reported to a health care provider. Women in their 20s and 30s should have a clinical breast exam (CBE) by a health care provider as part of a regular health exam every 1 to 3 years. After age 40, women should have a CBE every year. Starting at age 40, women should consider having a mammogram (breast X-ray test) every year. Women who have a family history of breast cancer should talk to their health care provider about genetic screening. Women at a high risk of breast cancer should talk to their health care providers about having an MRI and a mammogram every year.  Breast cancer gene (BRCA)-related cancer risk assessment is recommended for women who have family members with BRCA-related cancers. BRCA-related cancers include breast, ovarian, tubal, and peritoneal cancers. Having family members with these cancers may be associated with an increased risk for harmful changes (mutations) in the breast cancer genes BRCA1 and  BRCA2. Results of the assessment will determine the need for genetic counseling and BRCA1 and BRCA2 testing.  Your health care provider may recommend that you be screened regularly for cancer of the pelvic organs (ovaries, uterus, and vagina). This screening involves a pelvic examination, including checking for microscopic changes to the surface of your cervix (Pap test). You may be encouraged to have this screening done every 3 years, beginning at age 21.  For women ages 30-65, health care providers may recommend pelvic exams and Pap testing every 3 years, or they may recommend the Pap and pelvic exam, combined with testing for human papilloma virus (HPV), every 5 years. Some types of HPV increase your risk of cervical cancer. Testing for HPV may also be done on women of any age with unclear Pap test results.  Other health care providers may not recommend any screening for nonpregnant women who are considered low risk for pelvic cancer and who do not have symptoms. Ask your health care provider if a screening pelvic exam is right for   you.  If you have had past treatment for cervical cancer or a condition that could lead to cancer, you need Pap tests and screening for cancer for at least 20 years after your treatment. If Pap tests have been discontinued, your risk factors (such as having a new sexual partner) need to be reassessed to determine if screening should resume. Some women have medical problems that increase the chance of getting cervical cancer. In these cases, your health care provider may recommend more frequent screening and Pap tests.  Colorectal cancer can be detected and often prevented. Most routine colorectal cancer screening begins at the age of 50 years and continues through age 75 years. However, your health care provider may recommend screening at an earlier age if you have risk factors for colon cancer. On a yearly basis, your health care provider may provide home test kits to check  for hidden blood in the stool. Use of a small camera at the end of a tube, to directly examine the colon (sigmoidoscopy or colonoscopy), can detect the earliest forms of colorectal cancer. Talk to your health care provider about this at age 50, when routine screening begins. Direct exam of the colon should be repeated every 5-10 years through age 75 years, unless early forms of precancerous polyps or small growths are found.  People who are at an increased risk for hepatitis B should be screened for this virus. You are considered at high risk for hepatitis B if:  You were born in a country where hepatitis B occurs often. Talk with your health care provider about which countries are considered high risk.  Your parents were born in a high-risk country and you have not received a shot to protect against hepatitis B (hepatitis B vaccine).  You have HIV or AIDS.  You use needles to inject street drugs.  You live with, or have sex with, someone who has hepatitis B.  You get hemodialysis treatment.  You take certain medicines for conditions like cancer, organ transplantation, and autoimmune conditions.  Hepatitis C blood testing is recommended for all people born from 1945 through 1965 and any individual with known risks for hepatitis C.  Practice safe sex. Use condoms and avoid high-risk sexual practices to reduce the spread of sexually transmitted infections (STIs). STIs include gonorrhea, chlamydia, syphilis, trichomonas, herpes, HPV, and human immunodeficiency virus (HIV). Herpes, HIV, and HPV are viral illnesses that have no cure. They can result in disability, cancer, and death.  You should be screened for sexually transmitted illnesses (STIs) including gonorrhea and chlamydia if:  You are sexually active and are younger than 24 years.  You are older than 24 years and your health care provider tells you that you are at risk for this type of infection.  Your sexual activity has changed  since you were last screened and you are at an increased risk for chlamydia or gonorrhea. Ask your health care provider if you are at risk.  If you are at risk of being infected with HIV, it is recommended that you take a prescription medicine daily to prevent HIV infection. This is called preexposure prophylaxis (PrEP). You are considered at risk if:  You are sexually active and do not regularly use condoms or know the HIV status of your partner(s).  You take drugs by injection.  You are sexually active with a partner who has HIV.  Talk with your health care provider about whether you are at high risk of being infected with HIV. If   you choose to begin PrEP, you should first be tested for HIV. You should then be tested every 3 months for as long as you are taking PrEP.  Osteoporosis is a disease in which the bones lose minerals and strength with aging. This can result in serious bone fractures or breaks. The risk of osteoporosis can be identified using a bone density scan. Women ages 67 years and over and women at risk for fractures or osteoporosis should discuss screening with their health care providers. Ask your health care provider whether you should take a calcium supplement or vitamin D to reduce the rate of osteoporosis.  Menopause can be associated with physical symptoms and risks. Hormone replacement therapy is available to decrease symptoms and risks. You should talk to your health care provider about whether hormone replacement therapy is right for you.  Use sunscreen. Apply sunscreen liberally and repeatedly throughout the day. You should seek shade when your shadow is shorter than you. Protect yourself by wearing long sleeves, pants, a wide-brimmed hat, and sunglasses year round, whenever you are outdoors.  Once a month, do a whole body skin exam, using a mirror to look at the skin on your back. Tell your health care provider of new moles, moles that have irregular borders, moles that  are larger than a pencil eraser, or moles that have changed in shape or color.  Stay current with required vaccines (immunizations).  Influenza vaccine. All adults should be immunized every year.  Tetanus, diphtheria, and acellular pertussis (Td, Tdap) vaccine. Pregnant women should receive 1 dose of Tdap vaccine during each pregnancy. The dose should be obtained regardless of the length of time since the last dose. Immunization is preferred during the 27th-36th week of gestation. An adult who has not previously received Tdap or who does not know her vaccine status should receive 1 dose of Tdap. This initial dose should be followed by tetanus and diphtheria toxoids (Td) booster doses every 10 years. Adults with an unknown or incomplete history of completing a 3-dose immunization series with Td-containing vaccines should begin or complete a primary immunization series including a Tdap dose. Adults should receive a Td booster every 10 years.  Varicella vaccine. An adult without evidence of immunity to varicella should receive 2 doses or a second dose if she has previously received 1 dose. Pregnant females who do not have evidence of immunity should receive the first dose after pregnancy. This first dose should be obtained before leaving the health care facility. The second dose should be obtained 4-8 weeks after the first dose.  Human papillomavirus (HPV) vaccine. Females aged 13-26 years who have not received the vaccine previously should obtain the 3-dose series. The vaccine is not recommended for use in pregnant females. However, pregnancy testing is not needed before receiving a dose. If a female is found to be pregnant after receiving a dose, no treatment is needed. In that case, the remaining doses should be delayed until after the pregnancy. Immunization is recommended for any person with an immunocompromised condition through the age of 61 years if she did not get any or all doses earlier. During the  3-dose series, the second dose should be obtained 4-8 weeks after the first dose. The third dose should be obtained 24 weeks after the first dose and 16 weeks after the second dose.  Zoster vaccine. One dose is recommended for adults aged 30 years or older unless certain conditions are present.  Measles, mumps, and rubella (MMR) vaccine. Adults born  before 1957 generally are considered immune to measles and mumps. Adults born in 1957 or later should have 1 or more doses of MMR vaccine unless there is a contraindication to the vaccine or there is laboratory evidence of immunity to each of the three diseases. A routine second dose of MMR vaccine should be obtained at least 28 days after the first dose for students attending postsecondary schools, health care workers, or international travelers. People who received inactivated measles vaccine or an unknown type of measles vaccine during 1963-1967 should receive 2 doses of MMR vaccine. People who received inactivated mumps vaccine or an unknown type of mumps vaccine before 1979 and are at high risk for mumps infection should consider immunization with 2 doses of MMR vaccine. For females of childbearing age, rubella immunity should be determined. If there is no evidence of immunity, females who are not pregnant should be vaccinated. If there is no evidence of immunity, females who are pregnant should delay immunization until after pregnancy. Unvaccinated health care workers born before 1957 who lack laboratory evidence of measles, mumps, or rubella immunity or laboratory confirmation of disease should consider measles and mumps immunization with 2 doses of MMR vaccine or rubella immunization with 1 dose of MMR vaccine.  Pneumococcal 13-valent conjugate (PCV13) vaccine. When indicated, a person who is uncertain of his immunization history and has no record of immunization should receive the PCV13 vaccine. All adults 65 years of age and older should receive this  vaccine. An adult aged 19 years or older who has certain medical conditions and has not been previously immunized should receive 1 dose of PCV13 vaccine. This PCV13 should be followed with a dose of pneumococcal polysaccharide (PPSV23) vaccine. Adults who are at high risk for pneumococcal disease should obtain the PPSV23 vaccine at least 8 weeks after the dose of PCV13 vaccine. Adults older than 58 years of age who have normal immune system function should obtain the PPSV23 vaccine dose at least 1 year after the dose of PCV13 vaccine.  Pneumococcal polysaccharide (PPSV23) vaccine. When PCV13 is also indicated, PCV13 should be obtained first. All adults aged 65 years and older should be immunized. An adult younger than age 65 years who has certain medical conditions should be immunized. Any person who resides in a nursing home or long-term care facility should be immunized. An adult smoker should be immunized. People with an immunocompromised condition and certain other conditions should receive both PCV13 and PPSV23 vaccines. People with human immunodeficiency virus (HIV) infection should be immunized as soon as possible after diagnosis. Immunization during chemotherapy or radiation therapy should be avoided. Routine use of PPSV23 vaccine is not recommended for American Indians, [place] Natives, or people younger than 65 years unless there are medical conditions that require PPSV23 vaccine. When indicated, people who have unknown immunization and have no record of immunization should receive PPSV23 vaccine. One-time revaccination 5 years after the first dose of PPSV23 is recommended for people aged 19-64 years who have chronic kidney failure, nephrotic syndrome, asplenia, or immunocompromised conditions. People who received 1-2 doses of PPSV23 before age 65 years should receive another dose of PPSV23 vaccine at age 65 years or later if at least 5 years have passed since the previous dose. Doses of PPSV23 are not  needed for people immunized with PPSV23 at or after age 65 years.  Meningococcal vaccine. Adults with asplenia or persistent complement component deficiencies should receive 2 doses of quadrivalent meningococcal conjugate (MenACWY-D) vaccine. The doses should be obtained   at least 2 months apart. Microbiologists working with certain meningococcal bacteria, [place] recruits, people at risk during an outbreak, and people who travel to or live in countries with a high rate of meningitis should be immunized. A first-year college student up through age 34 years who is living in a residence hall should receive a dose if she did not receive a dose on or after her 16th birthday. Adults who have certain high-risk conditions should receive one or more doses of vaccine.  Hepatitis A vaccine. Adults who wish to be protected from this disease, have certain high-risk conditions, work with hepatitis A-infected animals, work in hepatitis A research labs, or travel to or work in countries with a high rate of hepatitis A should be immunized. Adults who were previously unvaccinated and who anticipate close contact with an international adoptee during the first 60 days after arrival in the [place] from a country with a high rate of hepatitis A should be immunized.  Hepatitis B vaccine. Adults who wish to be protected from this disease, have certain high-risk conditions, may be exposed to blood or other infectious body fluids, are household contacts or sex partners of hepatitis B positive people, are clients or workers in certain care facilities, or travel to or work in countries with a high rate of hepatitis B should be immunized.  Haemophilus influenzae type b (Hib) vaccine. A previously unvaccinated person with asplenia or sickle cell disease or having a scheduled splenectomy should receive 1 dose of Hib vaccine. Regardless of previous immunization, a recipient of a hematopoietic stem cell transplant should receive a  3-dose series 6-12 months after her successful transplant. Hib vaccine is not recommended for adults with HIV infection. Preventive Services / Frequency Ages 35 to 4 years  Blood pressure check.** / Every 3-5 years.  Lipid and cholesterol check.** / Every 5 years beginning at age 60.  Clinical breast exam.** / Every 3 years for women in their 71s and 10s.  BRCA-related cancer risk assessment.** / For women who have family members with a BRCA-related cancer (breast, ovarian, tubal, or peritoneal cancers).  Pap test.** / Every 2 years from ages 76 through 26. Every 3 years starting at age 61 through age 76 or 93 with a history of 3 consecutive normal Pap tests.  HPV screening.** / Every 3 years from ages 37 through ages 60 to 51 with a history of 3 consecutive normal Pap tests.  Hepatitis C blood test.** / For any individual with known risks for hepatitis C.  Skin self-exam. / Monthly.  Influenza vaccine. / Every year.  Tetanus, diphtheria, and acellular pertussis (Tdap, Td) vaccine.** / Consult your health care provider. Pregnant women should receive 1 dose of Tdap vaccine during each pregnancy. 1 dose of Td every 10 years.  Varicella vaccine.** / Consult your health care provider. Pregnant females who do not have evidence of immunity should receive the first dose after pregnancy.  HPV vaccine. / 3 doses over 6 months, if 93 and younger. The vaccine is not recommended for use in pregnant females. However, pregnancy testing is not needed before receiving a dose.  Measles, mumps, rubella (MMR) vaccine.** / You need at least 1 dose of MMR if you were born in 1957 or later. You may also need a 2nd dose. For females of childbearing age, rubella immunity should be determined. If there is no evidence of immunity, females who are not pregnant should be vaccinated. If there is no evidence of immunity, females who are  pregnant should delay immunization until after pregnancy.  Pneumococcal  13-valent conjugate (PCV13) vaccine.** / Consult your health care provider.  Pneumococcal polysaccharide (PPSV23) vaccine.** / 1 to 2 doses if you smoke cigarettes or if you have certain conditions.  Meningococcal vaccine.** / 1 dose if you are age 68 to 8 years and a Market researcher living in a residence hall, or have one of several medical conditions, you need to get vaccinated against meningococcal disease. You may also need additional booster doses.  Hepatitis A vaccine.** / Consult your health care provider.  Hepatitis B vaccine.** / Consult your health care provider.  Haemophilus influenzae type b (Hib) vaccine.** / Consult your health care provider. Ages 7 to 53 years  Blood pressure check.** / Every year.  Lipid and cholesterol check.** / Every 5 years beginning at age 25 years.  Lung cancer screening. / Every year if you are aged 11-80 years and have a 30-pack-year history of smoking and currently smoke or have quit within the past 15 years. Yearly screening is stopped once you have quit smoking for at least 15 years or develop a health problem that would prevent you from having lung cancer treatment.  Clinical breast exam.** / Every year after age 48 years.  BRCA-related cancer risk assessment.** / For women who have family members with a BRCA-related cancer (breast, ovarian, tubal, or peritoneal cancers).  Mammogram.** / Every year beginning at age 41 years and continuing for as long as you are in good health. Consult with your health care provider.  Pap test.** / Every 3 years starting at age 65 years through age 37 or 70 years with a history of 3 consecutive normal Pap tests.  HPV screening.** / Every 3 years from ages 72 years through ages 60 to 40 years with a history of 3 consecutive normal Pap tests.  Fecal occult blood test (FOBT) of stool. / Every year beginning at age 21 years and continuing until age 5 years. You may not need to do this test if you get  a colonoscopy every 10 years.  Flexible sigmoidoscopy or colonoscopy.** / Every 5 years for a flexible sigmoidoscopy or every 10 years for a colonoscopy beginning at age 35 years and continuing until age 48 years.  Hepatitis C blood test.** / For all people born from [birth year] through 1965 and any individual with known risks for hepatitis C.  Skin self-exam. / Monthly.  Influenza vaccine. / Every year.  Tetanus, diphtheria, and acellular pertussis (Tdap/Td) vaccine.** / Consult your health care provider. Pregnant women should receive 1 dose of Tdap vaccine during each pregnancy. 1 dose of Td every 10 years.  Varicella vaccine.** / Consult your health care provider. Pregnant females who do not have evidence of immunity should receive the first dose after pregnancy.  Zoster vaccine.** / 1 dose for adults aged 30 years or older.  Measles, mumps, rubella (MMR) vaccine.** / You need at least 1 dose of MMR if you were born in 1957 or later. You may also need a second dose. For females of childbearing age, rubella immunity should be determined. If there is no evidence of immunity, females who are not pregnant should be vaccinated. If there is no evidence of immunity, females who are pregnant should delay immunization until after pregnancy.  Pneumococcal 13-valent conjugate (PCV13) vaccine.** / Consult your health care provider.  Pneumococcal polysaccharide (PPSV23) vaccine.** / 1 to 2 doses if you smoke cigarettes or if you have certain conditions.  Meningococcal vaccine.** /  Consult your health care provider.  Hepatitis A vaccine.** / Consult your health care provider.  Hepatitis B vaccine.** / Consult your health care provider.  Haemophilus influenzae type b (Hib) vaccine.** / Consult your health care provider. Ages 64 years and over  Blood pressure check.** / Every year.  Lipid and cholesterol check.** / Every 5 years beginning at age 23 years.  Lung cancer screening. / Every year if you  are aged 16-80 years and have a 30-pack-year history of smoking and currently smoke or have quit within the past 15 years. Yearly screening is stopped once you have quit smoking for at least 15 years or develop a health problem that would prevent you from having lung cancer treatment.  Clinical breast exam.** / Every year after age 74 years.  BRCA-related cancer risk assessment.** / For women who have family members with a BRCA-related cancer (breast, ovarian, tubal, or peritoneal cancers).  Mammogram.** / Every year beginning at age 44 years and continuing for as long as you are in good health. Consult with your health care provider.  Pap test.** / Every 3 years starting at age 58 years through age 22 or 39 years with 3 consecutive normal Pap tests. Testing can be stopped between 65 and 70 years with 3 consecutive normal Pap tests and no abnormal Pap or HPV tests in the past 10 years.  HPV screening.** / Every 3 years from ages 64 years through ages 70 or 61 years with a history of 3 consecutive normal Pap tests. Testing can be stopped between 65 and 70 years with 3 consecutive normal Pap tests and no abnormal Pap or HPV tests in the past 10 years.  Fecal occult blood test (FOBT) of stool. / Every year beginning at age 40 years and continuing until age 27 years. You may not need to do this test if you get a colonoscopy every 10 years.  Flexible sigmoidoscopy or colonoscopy.** / Every 5 years for a flexible sigmoidoscopy or every 10 years for a colonoscopy beginning at age 7 years and continuing until age 32 years.  Hepatitis C blood test.** / For all people born from [birth year] through 1964 and any individual with known risks for hepatitis C.  Osteoporosis screening.** / A one-time screening for women ages 30 years and over and women at risk for fractures or osteoporosis.  Skin self-exam. / Monthly.  Influenza vaccine. / Every year.  Tetanus, diphtheria, and acellular pertussis (Tdap/Td)  vaccine.** / 1 dose of Td every 10 years.  Varicella vaccine.** / Consult your health care provider.  Zoster vaccine.** / 1 dose for adults aged 35 years or older.  Pneumococcal 13-valent conjugate (PCV13) vaccine.** / Consult your health care provider.  Pneumococcal polysaccharide (PPSV23) vaccine.** / 1 dose for all adults aged 46 years and older.  Meningococcal vaccine.** / Consult your health care provider.  Hepatitis A vaccine.** / Consult your health care provider.  Hepatitis B vaccine.** / Consult your health care provider.  Haemophilus influenzae type b (Hib) vaccine.** / Consult your health care provider. ** Family history and personal history of risk and conditions may change your health care provider's recommendations.   This information is not intended to replace advice given to you by your health care provider. Make sure you discuss any questions you have with your health care provider.   Document Released: 01/15/2002 Document Revised: 12/10/2014 Document Reviewed: 04/16/2011 Elsevier Interactive Patient Education [employer].

## 2015-10-07 NOTE — Progress Notes (Signed)
Pre visit review using our clinic review tool, if applicable. No additional management support is needed unless otherwise documented below in the visit note. 

## 2015-10-07 NOTE — Progress Notes (Signed)
Subjective:    [name], female    DOB: 07/27/1956, 59 y.o.   MRN: [id number]  Chief Complaint  Patient presents with  . Annual Exam    HPI Patient is in today for annual exam. Generally doing well. Did have a couple weeks with of loose stool and some nausea but no vomiting. No symptoms of largely resolved otherwise she's been healthy. She remains active and tries to maintain a heart healthy diet. No recent fevers or acute concerns. Is in need of her mammogram being scheduled but denies any breast concerns. Patient declines Pap smear for now. Denies CP/palp/SOB/HA/congestion/fevers/GI or GU c/o. Taking meds as prescribed  Past Medical History  Diagnosis Date  . Clotting disorder ([place])   . Lipoma 03-04-12    Rt. back about scapular ares  . 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  '95- blow normal levels, but stable.  . Fibromyalgia 03-04-12    Sporadic pain  . Depression 03-04-12    tx. meds  . PONV (postoperative nausea and vomiting)   . Allergic state 04/17/2013  . Depression with anxiety 04/17/2013  . Preventative health care 04/17/2013  . GERD (gastroesophageal reflux disease) 08/17/2013  . Solitary pulmonary nodule 08/17/2013    LLL seen on CT in March 2014  . Preventative health care 08/17/2013  . Advanced care planning/counseling discussion 09/01/2014  . Overweight 03/06/2015  . Hyperlipidemia, mixed 10/16/2015    Past Surgical History  Procedure Laterality Date  . Splenectomy, total  1995  . Appendectomy  1995  . Irrigation and debridement abscess  03-04-12    x2- buttocks  . Parotid gland tumor excision Left     benign  . Breast surgery  03-04-12    12'12 -left needle biopsy(benign)-Titanium needle marker remains  . Mass excision  03/10/2012    Procedure: EXCISION MASS;  Surgeon: Odis Hollingshead, MD;  Location: [facility];  Service: General;  Laterality: N/A;  Removal of back lipoma  . Abdominal hysterectomy  1995    total, endometriosis,  fibroid, ovarian atrophy  . Colonoscopy  2008    WNL, Dr Cristina Gong  . Cholecystectomy N/A 04/24/2013    Procedure: LAPAROSCOPIC CHOLECYSTECTOMY WITH INTRAOPERATIVE CHOLANGIOGRAM;  Surgeon: Haywood Lasso, MD;  Location: [facility];  Service: General;  Laterality: N/A;    Family History  Problem Relation Age of Onset  . Heart disease Maternal Grandmother   . Heart disease Maternal Grandfather   . Heart attack Maternal Grandfather 62  . Heart disease Paternal Grandmother   . Heart attack Paternal Grandmother 86  . Heart disease Paternal Grandfather   . Cancer Paternal Grandfather     chest- smoker  . Diabetes Mother     type 2- controlled by diet  . Atrial fibrillation Father   . Hyperlipidemia Sister   . Depression Sister     Social History   Social History  . Marital Status: Single    Spouse Name: N/A  . Number of Children: N/A  . Years of Education: N/A   Occupational History  . Not on file.   Social History Main Topics  . Smoking status: Former Smoker -- 5 years    Quit date: 02/21/1983  . Smokeless tobacco: Never Used  . Alcohol Use: 0.0 oz/week    3-4 Cans of beer per week     Comment: weekends -beer  . Drug Use: No  . Sexual Activity: No     Comment: works as Glass blower/designer  at [employer], No major dietary restriction. lives with partner    Other Topics Concern  . Not on file   Social History Narrative   Works at [employer]    Outpatient Prescriptions Prior to Visit  Medication Sig Dispense Refill  . acetaminophen (TYLENOL) 500 MG tablet Take 1,000 mg by mouth every 6 (six) hours as needed for pain.    Marland Kitchen BIOTIN PO Take 1 capsule by mouth daily.    . clorazepate (TRANXENE) 7.5 MG tablet TAKE 1 TABLET BY MOUTH AT BEDTIME 90 tablet 0  . FLUoxetine (PROZAC) 20 MG capsule TAKE 2 CAPSULES (40 MG TOTAL) BY MOUTH DAILY. 60 capsule 0  . omeprazole (PRILOSEC) 20 MG capsule TAKE 1 CAPSULE (20 MG) BY MOUTH DAILY. (Patient taking differently: TAKE 1 CAPSULE  (20 MG) BY MOUTH OTHER DAY) 90 capsule 0  . PREMARIN 1.25 MG tablet TAKE 1 TABLET BY MOUTH DAILY. 30 tablet 0  . traZODone (DESYREL) 50 MG tablet TAKE 1 AND 1/2 TABLETS (75 MG TOTAL) BY MOUTH AT BEDTIME. 135 tablet 1  . ALPRAZolam (XANAX) 0.25 MG tablet Take 1 tablet (0.25 mg total) by mouth 2 (two) times daily as needed for sleep or anxiety. (Patient not taking: Reported on 10/07/2015) 20 tablet 2  . fluticasone (FLONASE) 50 MCG/ACT nasal spray Place 2 sprays into both nostrils daily. (Patient not taking: Reported on 10/06/2015) 16 g 6  . meclizine (ANTIVERT) 25 MG tablet TAKE 1 TABLET (25 MG) BY MOUTH 3 TIMES A DAY AS NEEDED FOR DIZZINESS OR NAUSEA. (Patient not taking: Reported on 10/07/2015) 30 tablet 0  . ondansetron (ZOFRAN) 4 MG tablet Take 1 tablet (4 mg total) by mouth every 6 (six) hours. (Patient not taking: Reported on 10/06/2015) 12 tablet 0   No facility-administered medications prior to visit.    Allergies  Allergen Reactions  . Codeine Nausea And Vomiting    Review of Systems  Constitutional: Negative for fever, chills and malaise/fatigue.  HENT: Negative for congestion and hearing loss.   Eyes: Negative for discharge.  Respiratory: Negative for cough, sputum production and shortness of breath.   Cardiovascular: Negative for chest pain, palpitations and leg swelling.  Gastrointestinal: Negative for heartburn, nausea, vomiting, abdominal pain, diarrhea, constipation and blood in stool.  Genitourinary: Negative for dysuria, urgency, frequency and hematuria.  Musculoskeletal: Negative for myalgias, back pain and falls.  Skin: Negative for rash.  Neurological: Negative for dizziness, sensory change, loss of consciousness, weakness and headaches.  Endo/Heme/Allergies: Negative for environmental allergies. Does not bruise/bleed easily.  Psychiatric/Behavioral: Negative for depression and suicidal ideas. The patient is not nervous/anxious and does not have insomnia.          Objective:    Physical Exam  Constitutional: She is oriented to person, place, and time. She appears well-developed and well-nourished. No distress.  HENT:  Head: Normocephalic and atraumatic.  Eyes: Conjunctivae are normal.  Neck: Neck supple. No thyromegaly present.  Cardiovascular: Normal rate, regular rhythm and normal heart sounds.   No murmur heard. Pulmonary/Chest: Effort normal and breath sounds normal. No respiratory distress.  Abdominal: Soft. Bowel sounds are normal. She exhibits no distension and no mass. There is no tenderness.  Musculoskeletal: She exhibits no edema.  Lymphadenopathy:    She has no cervical adenopathy.  Neurological: She is alert and oriented to person, place, and time.  Skin: Skin is warm and dry.  Psychiatric: She has a normal mood and affect. Her behavior is normal.    BP 120/70  mmHg  Pulse 70  Temp(Src) 98.1 F (36.7 C) (Oral)  Ht 5' 8.5" (1.74 m)  Wt 189 lb 6 oz (85.9 kg)  BMI 28.37 kg/m2  SpO2 96% Wt Readings from Last 3 Encounters:  10/07/15 189 lb 6 oz (85.9 kg)  07/05/15 195 lb (88.451 kg)  02/24/15 191 lb (86.637 kg)     Lab Results  Component Value Date   WBC 6.8 09/29/2015   HGB 12.6 09/29/2015   HCT 38.7 09/29/2015   PLT 51.0* 09/29/2015   GLUCOSE 88 09/29/2015   CHOL 207* 09/29/2015   TRIG 130.0 09/29/2015   HDL 65.90 09/29/2015   LDLCALC 115* 09/29/2015   ALT 12 09/29/2015   AST 16 09/29/2015   NA 139 09/29/2015   K 3.9 09/29/2015   CL 102 09/29/2015   CREATININE 0.84 09/29/2015   BUN 18 09/29/2015   CO2 30 09/29/2015   TSH 1.67 09/29/2015   INR 0.90 03/04/2012   HGBA1C 5.1 10/19/2013    Lab Results  Component Value Date   TSH 1.67 09/29/2015   Lab Results  Component Value Date   WBC 6.8 09/29/2015   HGB 12.6 09/29/2015   HCT 38.7 09/29/2015   MCV 92.6 09/29/2015   PLT 51.0* 09/29/2015   Lab Results  Component Value Date   NA 139 09/29/2015   K 3.9 09/29/2015   CO2 30 09/29/2015   GLUCOSE 88  09/29/2015   BUN 18 09/29/2015   CREATININE 0.84 09/29/2015   BILITOT 0.4 09/29/2015   ALKPHOS 100 09/29/2015   AST 16 09/29/2015   ALT 12 09/29/2015   PROT 8.0 09/29/2015   ALBUMIN 4.0 09/29/2015   CALCIUM 9.6 09/29/2015   GFR 73.82 09/29/2015   Lab Results  Component Value Date   CHOL 207* 09/29/2015   Lab Results  Component Value Date   HDL 65.90 09/29/2015   Lab Results  Component Value Date   LDLCALC 115* 09/29/2015   Lab Results  Component Value Date   TRIG 130.0 09/29/2015   Lab Results  Component Value Date   CHOLHDL 3 09/29/2015   Lab Results  Component Value Date   HGBA1C 5.1 10/19/2013       Assessment & Plan:   Problem List Items Addressed This Visit    Preventative health care    Patient encouraged to maintain heart healthy diet, regular exercise, adequate sleep. Consider daily probiotics. Take medications as prescribed. Given and reviewed copy of ACP documents from [employer] and encouraged to complete and return. Labs reviewed      Overweight    Encouraged DASH diet, decrease po intake and increase exercise as tolerated. Needs 7-8 hours of sleep nightly. Avoid trans fats, eat small, frequent meals every 4-5 hours with lean proteins, complex carbs and healthy fats. Minimize simple carbs,       ITP (idiopathic thrombocytopenic purpura) s/p splenectomy 1995 (Chronic)    Stable, asymptomatic, follows with hematology      Hyperlipidemia, mixed    Encouraged heart healthy diet, increase exercise, avoid trans fats, consider a krill oil cap daily       Other Visit Diagnoses    Postmenopausal estrogen deficiency    -  Primary    Relevant Orders    DG Bone Density    CBC    TSH    Lipid panel    Hepatitis C antibody    Comprehensive metabolic panel    HIV antibody (with reflex)    MM SCREENING BREAST  TOMO BILATERAL    Breast cancer screening        Relevant Orders    DG Bone Density    CBC    TSH    Lipid panel    Hepatitis C  antibody    Comprehensive metabolic panel    HIV antibody (with reflex)    MM SCREENING BREAST TOMO BILATERAL    Screening for viral disease        Relevant Orders    CBC    TSH    Lipid panel    Hepatitis C antibody    Comprehensive metabolic panel    HIV antibody (with reflex)    Thrombocytopenia (HCC)        Relevant Orders    CBC    TSH    Lipid panel    Hepatitis C antibody    Comprehensive metabolic panel    HIV antibody (with reflex)    Myalgia and myositis        Relevant Orders    CBC    TSH    Lipid panel    Hepatitis C antibody    Comprehensive metabolic panel    HIV antibody (with reflex)       I am having Ms. Parrales maintain her acetaminophen, BIOTIN PO, fluticasone, ALPRAZolam, traZODone, meclizine, ondansetron, clorazepate, FLUoxetine, PREMARIN, and omeprazole.  No orders of the defined types were placed in this encounter.     Penni Homans, MD

## 2015-10-16 ENCOUNTER — Encounter: Payer: Self-pay | Admitting: Family Medicine

## 2015-10-16 DIAGNOSIS — E782 Mixed hyperlipidemia: Secondary | ICD-10-CM

## 2015-10-16 HISTORY — DX: Mixed hyperlipidemia: E78.2

## 2015-10-16 NOTE — Assessment & Plan Note (Signed)
Encouraged heart healthy diet, increase exercise, avoid trans fats, consider a krill oil cap daily 

## 2015-10-16 NOTE — Assessment & Plan Note (Signed)
Encouraged DASH diet, decrease po intake and increase exercise as tolerated. Needs 7-8 hours of sleep nightly. Avoid trans fats, eat small, frequent meals every 4-5 hours with lean proteins, complex carbs and healthy fats. Minimize simple carbs, 

## 2015-10-16 NOTE — Assessment & Plan Note (Signed)
Stable, asymptomatic, follows with hematology

## 2015-10-16 NOTE — Assessment & Plan Note (Signed)
Patient encouraged to maintain heart healthy diet, regular exercise, adequate sleep. Consider daily probiotics. Take medications as prescribed. Given and reviewed copy of ACP documents from [place] Secretary of State and encouraged to complete and return. Labs reviewed.  

## 2015-10-31 ENCOUNTER — Other Ambulatory Visit: Payer: Self-pay | Admitting: Family Medicine

## 2015-10-31 NOTE — Telephone Encounter (Signed)
Faxed hardcopy for alprazolam to [facility].

## 2015-10-31 NOTE — Telephone Encounter (Signed)
Requesting: alprazolam Contract none UDS  none Last OV   10/07/2015 Last Refill   02/04/15   #20 with 2 refills.  Please Advise

## 2015-12-05 ENCOUNTER — Other Ambulatory Visit: Payer: Self-pay | Admitting: Family Medicine

## 2015-12-05 MED FILL — ALPRAZolam 0.25 MG TABS: 0.25 | 10 days supply | Qty: 20 | Fill #1

## 2015-12-05 MED FILL — MONTELUKAST SOD 10 MG TAB: 10 | 30 days supply | Qty: 30 | Fill #5

## 2015-12-05 MED FILL — FLUoxetine HCL 20 MG CAPS: 20 | 30 days supply | Qty: 60 | Fill #1

## 2015-12-05 MED FILL — PREMARIN 1.25 MG TABLET: 1.25 | 30 days supply | Qty: 30 | Fill #1

## 2015-12-06 MED FILL — traZODone HCL 50 MG TABS: 50 | 90 days supply | Qty: 135 | Fill #0

## 2015-12-06 NOTE — Telephone Encounter (Signed)
Refilled patients Rx #[id number] with 1 refill patient has apt Apr 03, 2016.

## 2015-12-13 ENCOUNTER — Ambulatory Visit (HOSPITAL_BASED_OUTPATIENT_CLINIC_OR_DEPARTMENT_OTHER)
Admission: RE | Admit: 2015-12-13 | Discharge: 2015-12-13 | Disposition: A | Discharge status: Home / Self Care | Payer: BLUE CROSS/BLUE SHIELD | Source: Ambulatory Visit | Attending: Family Medicine | Admitting: Family Medicine

## 2015-12-13 DIAGNOSIS — Z1239 Encounter for other screening for malignant neoplasm of breast: Secondary | ICD-10-CM

## 2015-12-13 DIAGNOSIS — Z87891 Personal history of nicotine dependence: Secondary | ICD-10-CM | POA: Diagnosis not present

## 2015-12-13 DIAGNOSIS — Z1231 Encounter for screening mammogram for malignant neoplasm of breast: Secondary | ICD-10-CM | POA: Diagnosis not present

## 2015-12-13 DIAGNOSIS — Z90710 Acquired absence of both cervix and uterus: Secondary | ICD-10-CM | POA: Diagnosis not present

## 2015-12-13 DIAGNOSIS — Z780 Asymptomatic menopausal state: Secondary | ICD-10-CM

## 2015-12-13 DIAGNOSIS — M8580 Other specified disorders of bone density and structure, unspecified site: Secondary | ICD-10-CM | POA: Insufficient documentation

## 2015-12-28 ENCOUNTER — Other Ambulatory Visit: Payer: Self-pay | Admitting: Family Medicine

## 2015-12-28 MED FILL — OMEPRAZOLE DR 20 MG CAPSULE: 20 | 90 days supply | Qty: 90 | Fill #1

## 2015-12-28 NOTE — Telephone Encounter (Signed)
Requesting:   clorazepate Contract   None UDS   None Last OV   10/07/2015 Last Refill   #90 with 0 refills on 09/26/2015  Please Advise

## 2015-12-29 MED ORDER — CLORAZEPATE DIPOTASSIUM 7.5 MG PO TABS: 7.5000 mg | ORAL_TABLET | Freq: Every day | ORAL | Status: DC

## 2016-01-02 NOTE — Telephone Encounter (Signed)
Prescription was phoned in on 01/02/2016.

## 2016-01-09 MED FILL — ALPRAZolam 0.25 MG TABS: 0.25 | 10 days supply | Qty: 20 | Fill #2

## 2016-01-09 MED FILL — PREMARIN 1.25 MG TABLET: 1.25 | 30 days supply | Qty: 30 | Fill #2

## 2016-01-09 MED FILL — FLUoxetine HCL 20 MG CAPS: 20 | 30 days supply | Qty: 60 | Fill #2

## 2016-02-06 MED FILL — FLUoxetine HCL 20 MG CAPS: 20 | 30 days supply | Qty: 60 | Fill #3

## 2016-02-06 MED FILL — PREMARIN 1.25 MG TABLET: 1.25 | 30 days supply | Qty: 30 | Fill #3

## 2016-02-07 ENCOUNTER — Ambulatory Visit (INDEPENDENT_AMBULATORY_CARE_PROVIDER_SITE_OTHER): Payer: BLUE CROSS/BLUE SHIELD | Admitting: Family Medicine

## 2016-02-07 ENCOUNTER — Encounter: Payer: Self-pay | Admitting: Family Medicine

## 2016-02-07 VITALS — BP 113/61 | HR 65 | Temp 98.2°F | Ht 69.0 in | Wt 186.2 lb

## 2016-02-07 DIAGNOSIS — K219 Gastro-esophageal reflux disease without esophagitis: Secondary | ICD-10-CM

## 2016-02-07 DIAGNOSIS — R42 Dizziness and giddiness: Secondary | ICD-10-CM | POA: Diagnosis not present

## 2016-02-07 DIAGNOSIS — R229 Localized swelling, mass and lump, unspecified: Secondary | ICD-10-CM

## 2016-02-07 DIAGNOSIS — D693 Immune thrombocytopenic purpura: Secondary | ICD-10-CM

## 2016-02-07 DIAGNOSIS — R079 Chest pain, unspecified: Secondary | ICD-10-CM

## 2016-02-07 DIAGNOSIS — E782 Mixed hyperlipidemia: Secondary | ICD-10-CM

## 2016-02-07 DIAGNOSIS — E663 Overweight: Secondary | ICD-10-CM

## 2016-02-07 NOTE — Progress Notes (Signed)
[name], female   DOB: Jul 07, 1956, 60 y.o.   MRN: [id number]   Subjective:    [name], female    DOB: Dec 11, 1955, 60 y.o.   MRN: [id number]  Chief Complaint  Patient presents with  . Cyst    HPI Patient is in today for evaluation of numerous concerns. Notes pain over lesion at base of her posterior nectk, it is next to the spine and when she lies on it it is uncomfortable. She thinks it may be growing. No recent fever or URI symptoms. She is also endorsing atypical chest pain. She endorses heart burn, anxiety and palpitations. Denies palp/SOB/HA/congestion/fevers or GU c/o. Taking meds as prescribed  Past Medical History  Diagnosis Date  . Clotting disorder ([place])   . Lipoma 03-04-12    Rt. back about scapular ares  . 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  '95- blow normal levels, but stable.  . Fibromyalgia 03-04-12    Sporadic pain  . Depression 03-04-12    tx. meds  . PONV (postoperative nausea and vomiting)   . Allergic state 04/17/2013  . Depression with anxiety 04/17/2013  . Preventative health care 04/17/2013  . GERD (gastroesophageal reflux disease) 08/17/2013  . Solitary pulmonary nodule 08/17/2013    LLL seen on CT in March 2014  . Preventative health care 08/17/2013  . Advanced care planning/counseling discussion 09/01/2014  . Overweight 03/06/2015  . Hyperlipidemia, mixed 10/16/2015    Past Surgical History  Procedure Laterality Date  . Splenectomy, total  1995  . Appendectomy  1995  . Irrigation and debridement abscess  03-04-12    x2- buttocks  . Parotid gland tumor excision Left     benign  . Breast surgery  03-04-12    12'12 -left needle biopsy(benign)-Titanium needle marker remains  . Mass excision  03/10/2012    Procedure: EXCISION MASS;  Surgeon: Odis Hollingshead, MD;  Location: [facility];  Service: General;  Laterality: N/A;  Removal of back lipoma  . Abdominal hysterectomy  1995    total, endometriosis,  fibroid, ovarian atrophy  . Colonoscopy  2008    WNL, Dr Cristina Gong  . Cholecystectomy N/A 04/24/2013    Procedure: LAPAROSCOPIC CHOLECYSTECTOMY WITH INTRAOPERATIVE CHOLANGIOGRAM;  Surgeon: Haywood Lasso, MD;  Location: [facility];  Service: General;  Laterality: N/A;    Family History  Problem Relation Age of Onset  . Heart disease Maternal Grandmother   . Heart disease Maternal Grandfather   . Heart attack Maternal Grandfather 62  . Heart disease Paternal Grandmother   . Heart attack Paternal Grandmother 86  . Heart disease Paternal Grandfather   . Cancer Paternal Grandfather     chest- smoker  . Diabetes Mother     type 2- controlled by diet  . Atrial fibrillation Father   . Hyperlipidemia Sister   . Depression Sister     Social History   Social History  . Marital Status: Single    Spouse Name: N/A  . Number of Children: N/A  . Years of Education: N/A   Occupational History  . Not on file.   Social History Main Topics  . Smoking status: Former Smoker -- 5 years    Quit date: 02/21/1983  . Smokeless tobacco: Never Used  . Alcohol Use: 0.0 oz/week    3-4 Cans of beer per week     Comment: weekends -beer  . Drug Use: No  . Sexual Activity: No  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 lives with partner    Other Topics Concern  . Not on file   Social History Narrative   Works at [employer]    Outpatient Prescriptions Prior to Visit  Medication Sig Dispense Refill  . acetaminophen (TYLENOL) 500 MG tablet Take 1,000 mg by mouth every 6 (six) hours as needed for pain.    Marland Kitchen ALPRAZolam (XANAX) 0.25 MG tablet TAKE 1 TABLET BY MOUTH TWICE DAILY AS NEEDED FOR SLEEP OR ANXIETY 20 tablet 2  . clorazepate (TRANXENE) 7.5 MG tablet Take 1 tablet (7.5 mg total) by mouth at bedtime. 90 tablet 0  . FLUoxetine (PROZAC) 20 MG capsule TAKE 2 CAPSULES BY MOUTH DAILY 60 capsule 5  . fluticasone (FLONASE) 50 MCG/ACT nasal spray  Place 2 sprays into both nostrils daily. (Patient not taking: Reported on 02/13/2016) 16 g 6  . meclizine (ANTIVERT) 25 MG tablet TAKE 1 TABLET (25 MG) BY MOUTH 3 TIMES A DAY AS NEEDED FOR DIZZINESS OR NAUSEA. 30 tablet 0  . ondansetron (ZOFRAN) 4 MG tablet Take 1 tablet (4 mg total) by mouth every 6 (six) hours. (Patient not taking: Reported on 02/13/2016) 12 tablet 0  . PREMARIN 1.25 MG tablet TAKE 1 TABLET BY MOUTH DAILY. (Patient taking differently: Take 1 tablet by mouth every other day.) 30 tablet 5  . traZODone (DESYREL) 50 MG tablet TAKE 1 AND 1/2 TABLETS (75 MG TOTAL) BY MOUTH AT BEDTIME. 135 tablet 1  . BIOTIN PO Take 1 capsule by mouth daily.    Marland Kitchen omeprazole (PRILOSEC) 20 MG capsule TAKE 1 CAPSULE (20 MG) BY MOUTH DAILY. (Patient taking differently: TAKE 1 CAPSULE (20 MG) BY MOUTH OTHER DAY) 90 capsule 0   No facility-administered medications prior to visit.    Allergies  Allergen Reactions  . Codeine Nausea And Vomiting    Review of Systems  Constitutional: Positive for malaise/fatigue. Negative for fever.  HENT: Negative for congestion.   Eyes: Negative for blurred vision.  Respiratory: Negative for shortness of breath.   Cardiovascular: Positive for chest pain. Negative for palpitations and leg swelling.  Gastrointestinal: Positive for heartburn. Negative for nausea, abdominal pain and blood in stool.  Genitourinary: Negative for dysuria and frequency.  Musculoskeletal: Positive for myalgias and neck pain. Negative for falls.  Skin: Negative for rash.  Neurological: Negative for dizziness, loss of consciousness and headaches.  Endo/Heme/Allergies: Negative for environmental allergies.  Psychiatric/Behavioral: Negative for depression. The patient is not nervous/anxious.        Objective:    Physical Exam  Constitutional: She is oriented to person, place, and time. She appears well-developed and well-nourished. No distress.  HENT:  Head: Normocephalic and atraumatic.    Nose: Nose normal.  Eyes: Right eye exhibits no discharge. Left eye exhibits no discharge.  Neck: Normal range of motion. Neck supple.  Cardiovascular: Normal rate and regular rhythm.   No murmur heard. Pulmonary/Chest: Effort normal and breath sounds normal.  Abdominal: Soft. Bowel sounds are normal. There is no tenderness.  Musculoskeletal: She exhibits no edema.  1-2 cm, firm, immobile lesion at base of neck in paravertebral distribution.  Neurological: She is alert and oriented to person, place, and time.  Skin: Skin is warm and dry.  Psychiatric: She has a normal mood and affect.  Nursing note and vitals reviewed.   BP 113/61 mmHg  Pulse 65  Temp(Src) 98.2 F (36.8 C) (Oral)  Ht 5\' 9"  (1.753 m)  Wt 186 lb 4 oz (84.482 kg)  BMI 27.49 kg/m2  SpO2 99% Wt Readings from Last 3 Encounters:  02/07/16 186 lb 4 oz (84.482 kg)  10/07/15 189 lb 6 oz (85.9 kg)  07/05/15 195 lb (88.451 kg)     Lab Results  Component Value Date   WBC 5.6 02/13/2016   HGB 12.6 02/13/2016   HCT 37.5 02/13/2016   PLT 66* 02/13/2016   GLUCOSE 106* 02/13/2016   CHOL 207* 09/29/2015   TRIG 130.0 09/29/2015   HDL 65.90 09/29/2015   LDLCALC 115* 09/29/2015   ALT 18 02/13/2016   AST 26 02/13/2016   NA 139 02/13/2016   K 4.3 02/13/2016   CL 105 02/13/2016   CREATININE 1.00 02/13/2016   BUN 19 02/13/2016   CO2 28 02/13/2016   TSH 1.67 02/10/2016   INR 0.90 03/04/2012   HGBA1C 5.1 10/19/2013    Lab Results  Component Value Date   TSH 1.67 02/10/2016   Lab Results  Component Value Date   WBC 5.6 02/13/2016   HGB 12.6 02/13/2016   HCT 37.5 02/13/2016   MCV 90.4 02/13/2016   PLT 66* 02/13/2016   Lab Results  Component Value Date   NA 139 02/13/2016   K 4.3 02/13/2016   CO2 28 02/13/2016   GLUCOSE 106* 02/13/2016   BUN 19 02/13/2016   CREATININE 1.00 02/13/2016   BILITOT 0.6 02/13/2016   ALKPHOS 98 02/13/2016   AST 26 02/13/2016   ALT 18 02/13/2016   PROT 8.3* 02/13/2016    ALBUMIN 4.3 02/13/2016   CALCIUM 9.5 02/13/2016   ANIONGAP 6 02/13/2016   GFR 70.80 02/10/2016   Lab Results  Component Value Date   CHOL 207* 09/29/2015   Lab Results  Component Value Date   HDL 65.90 09/29/2015   Lab Results  Component Value Date   LDLCALC 115* 09/29/2015   Lab Results  Component Value Date   TRIG 130.0 09/29/2015   Lab Results  Component Value Date   CHOLHDL 3 09/29/2015   Lab Results  Component Value Date   HGBA1C 5.1 10/19/2013       Assessment & Plan:   Problem List Items Addressed This Visit    GERD (gastroesophageal reflux disease)    Avoid offending foods, take probiotics. Do not eat large meals in late evening and consider raising head of bed. Take pantoprazole and ranitidine      Hyperlipidemia, mixed    Encouraged heart healthy diet, increase exercise, avoid trans fats, consider a krill oil cap daily      ITP (idiopathic thrombocytopenic purpura) s/p splenectomy 1995 (Chronic)    Stable, asymptomatic      Relevant Orders   EKG 12-Lead (Completed)   TSH (Completed)   CBC with Differential/Platelet (Completed)   Comprehensive metabolic panel (Completed)   Sedimentation rate (Completed)   H. pylori antibody, IgG (Completed)   CT Head Wo Contrast (Completed)   US Soft Tissue Head/Neck (Completed)   Overweight    Encouraged DASH diet, decrease po intake and increase exercise as tolerated. Needs 7-8 hours of sleep nightly. Avoid trans fats, eat small, frequent meals every 4-5 hours with lean proteins, complex carbs and healthy fats. Minimize simple carbs      Pain in the chest    EKG is normal today. Likely not cardiac but patient aware to seek care if recurs and does not resolve. Likely a combination of reflux and anxiety. Will treat these and monitor  Relevant Orders   EKG 12-Lead (Completed)   TSH (Completed)   CBC with Differential/Platelet (Completed)   Comprehensive metabolic panel (Completed)   Sedimentation rate  (Completed)   H. pylori antibody, IgG (Completed)   CT Head Wo Contrast (Completed)   US Soft Tissue Head/Neck (Completed)   Skin nodule    At base of posterior neck near spine is irritating. Proceed with imaging and consider removing due to discomfort.      Relevant Orders   EKG 12-Lead (Completed)   TSH (Completed)   CBC with Differential/Platelet (Completed)   Comprehensive metabolic panel (Completed)   Sedimentation rate (Completed)   H. pylori antibody, IgG (Completed)   CT Head Wo Contrast (Completed)   US Soft Tissue Head/Neck (Completed)   Vertigo - Primary   Relevant Orders   EKG 12-Lead (Completed)   TSH (Completed)   CBC with Differential/Platelet (Completed)   Comprehensive metabolic panel (Completed)   Sedimentation rate (Completed)   H. pylori antibody, IgG (Completed)   CT Head Wo Contrast (Completed)   US Soft Tissue Head/Neck (Completed)      I have discontinued Ms. Wahba's omeprazole. I am also having her maintain her acetaminophen, fluticasone, meclizine, ondansetron, PREMARIN, FLUoxetine, ALPRAZolam, traZODone, and clorazepate.  No orders of the defined types were placed in this encounter.     Penni Homans, MD

## 2016-02-07 NOTE — Patient Instructions (Signed)
Thrombocytopenia °Thrombocytopenia is a condition in which there is an abnormally small number of platelets in your blood. Platelets are also called thrombocytes. Platelets are needed for blood clotting. °CAUSES °Thrombocytopenia is caused by:  °· Decreased production of platelets. This can be caused by: °¨ Aplastic anemia in which your bone marrow quits making blood cells. °¨ Cancer in the bone marrow. °¨ Use of certain medicines, including chemotherapy. °¨ Infection in the bone marrow. °¨ Heavy alcohol consumption. °· Increased destruction of platelets. This can be caused by: °¨ Certain immune diseases. °¨ Use of certain drugs. °¨ Certain blood clotting disorders. °¨ Certain inherited disorders. °¨ Certain bleeding disorders. °¨ Pregnancy. °· Having an enlarged spleen (hypersplenism). In hypersplenism, the spleen gathers up platelets from circulation. This means the platelets are not available to help with blood clotting. The spleen can enlarge due to cirrhosis or other conditions. °SYMPTOMS  °The symptoms of thrombocytopenia are side effects of poor blood clotting. Some of these are: °· Abnormal bleeding. °· Nosebleeds. °· Heavy menstrual periods. °· Blood in the urine or stools. °· Purpura. This is a purplish discoloration in the skin produced by small bleeding vessels near the surface of the skin. °· Bruising. °· A rash that may be petechial. This looks like pinpoint, purplish-red spots on the skin and mucous membranes. It is caused by bleeding from small blood vessels (capillaries). °DIAGNOSIS  °Your caregiver will make this diagnosis based on your exam and blood tests. Sometimes, a bone marrow study is done to look for the original cells (megakaryocytes) that make platelets. °TREATMENT  °Treatment depends on the cause of the condition. °· Medicines may be given to help protect your platelets from being destroyed. °· In some cases, a replacement (transfusion) of platelets may be required to stop or prevent  bleeding. °· Sometimes, the spleen must be surgically removed. °HOME CARE INSTRUCTIONS  °· Check the skin and linings inside your mouth for bruising or bleeding as directed by your caregiver. °· Check your sputum, urine, and stool for blood as directed by your caregiver. °· Do not return to any activities that could cause bumps or bruises until your caregiver says it is okay. °· Take extra care not to cut yourself when shaving or when using scissors, needles, knives, and other tools. °· Take extra care not to burn yourself when ironing or cooking. °· Ask your caregiver if it is okay for you to drink alcohol. °· Only take over-the-counter or prescription medicines as directed by your caregiver. °· Notify all your caregivers, including dentists and eye doctors, about your condition. °SEEK IMMEDIATE MEDICAL CARE IF:  °· You develop active bleeding from anywhere in your body. °· You develop unexplained bruising or bleeding. °· You have blood in your sputum, urine, or stool. °MAKE SURE YOU: °· Understand these instructions. °· Will watch your condition. °· Will get help right away if you are not doing well or get worse. °  °This information is not intended to replace advice given to you by your health care provider. Make sure you discuss any questions you have with your health care provider. °  °Document Released: 11/19/2005 Document Revised: 02/11/2012 Document Reviewed: 05/23/2015 °Elsevier Interactive Patient Education ©2016 [employer] ° °

## 2016-02-07 NOTE — Progress Notes (Signed)
Pre visit review using our clinic review tool, if applicable. No additional management support is needed unless otherwise documented below in the visit note. 

## 2016-02-09 ENCOUNTER — Other Ambulatory Visit: Payer: BLUE CROSS/BLUE SHIELD

## 2016-02-10 ENCOUNTER — Other Ambulatory Visit: Payer: Self-pay | Admitting: Family Medicine

## 2016-02-10 ENCOUNTER — Ambulatory Visit (HOSPITAL_BASED_OUTPATIENT_CLINIC_OR_DEPARTMENT_OTHER)
Admission: RE | Admit: 2016-02-10 | Discharge: 2016-02-10 | Disposition: A | Discharge status: Home / Self Care | Payer: BLUE CROSS/BLUE SHIELD | Source: Ambulatory Visit | Attending: Family Medicine | Admitting: Family Medicine

## 2016-02-10 ENCOUNTER — Other Ambulatory Visit: Payer: BLUE CROSS/BLUE SHIELD

## 2016-02-10 DIAGNOSIS — D693 Immune thrombocytopenic purpura: Secondary | ICD-10-CM

## 2016-02-10 DIAGNOSIS — R42 Dizziness and giddiness: Secondary | ICD-10-CM | POA: Diagnosis present

## 2016-02-10 DIAGNOSIS — R221 Localized swelling, mass and lump, neck: Secondary | ICD-10-CM | POA: Insufficient documentation

## 2016-02-10 DIAGNOSIS — R9082 White matter disease, unspecified: Secondary | ICD-10-CM | POA: Insufficient documentation

## 2016-02-10 DIAGNOSIS — L989 Disorder of the skin and subcutaneous tissue, unspecified: Secondary | ICD-10-CM

## 2016-02-10 DIAGNOSIS — G319 Degenerative disease of nervous system, unspecified: Secondary | ICD-10-CM | POA: Insufficient documentation

## 2016-02-10 DIAGNOSIS — R079 Chest pain, unspecified: Secondary | ICD-10-CM

## 2016-02-10 DIAGNOSIS — R229 Localized swelling, mass and lump, unspecified: Secondary | ICD-10-CM

## 2016-02-10 LAB — TSH: TSH: 1.67 u[IU]/mL (ref 0.35–4.50)

## 2016-02-10 LAB — CBC WITH DIFFERENTIAL/PLATELET
BASOS ABS: 0.1 10*3/uL (ref 0.0–0.1)
BASOS PCT: 0.9 % (ref 0.0–3.0)
EOS ABS: 0.1 10*3/uL (ref 0.0–0.7)
Eosinophils Relative: 1.9 % (ref 0.0–5.0)
HEMATOCRIT: 37.5 % (ref 36.0–46.0)
HEMOGLOBIN: 12.7 g/dL (ref 12.0–15.0)
LYMPHS PCT: 36.2 % (ref 12.0–46.0)
Lymphs Abs: 2.4 10*3/uL (ref 0.7–4.0)
MCHC: 33.9 g/dL (ref 30.0–36.0)
MCV: 91.2 fl (ref 78.0–100.0)
Monocytes Absolute: 0.6 10*3/uL (ref 0.1–1.0)
Monocytes Relative: 9.1 % (ref 3.0–12.0)
Neutro Abs: 3.4 10*3/uL (ref 1.4–7.7)
Neutrophils Relative %: 51.9 % (ref 43.0–77.0)
Platelets: 60 10*3/uL — ABNORMAL LOW (ref 150.0–400.0)
RBC: 4.12 Mil/uL (ref 3.87–5.11)
RDW: 13.4 % (ref 11.5–15.5)
WBC: 6.6 10*3/uL (ref 4.0–10.5)

## 2016-02-10 LAB — COMPREHENSIVE METABOLIC PANEL
ALBUMIN: 4.1 g/dL (ref 3.5–5.2)
ALT: 16 U/L (ref 0–35)
AST: 20 U/L (ref 0–37)
Alkaline Phosphatase: 85 U/L (ref 39–117)
BILIRUBIN TOTAL: 0.3 mg/dL (ref 0.2–1.2)
BUN: 19 mg/dL (ref 6–23)
CO2: 28 mEq/L (ref 19–32)
CREATININE: 0.87 mg/dL (ref 0.40–1.20)
Calcium: 9.4 mg/dL (ref 8.4–10.5)
Chloride: 104 mEq/L (ref 96–112)
GFR: 70.8 mL/min (ref >60.00)
Glucose, Bld: 99 mg/dL (ref 70–99)
Potassium: 4 mEq/L (ref 3.5–5.1)
Sodium: 141 mEq/L (ref 135–145)
Total Protein: 8 g/dL (ref 6.0–8.3)

## 2016-02-10 LAB — H. PYLORI ANTIBODY, IGG: H Pylori IgG: NEGATIVE

## 2016-02-10 LAB — SEDIMENTATION RATE: SED RATE: 22 mm/h (ref 0–22)

## 2016-02-13 ENCOUNTER — Telehealth: Payer: Self-pay | Admitting: Family Medicine

## 2016-02-13 ENCOUNTER — Encounter (HOSPITAL_COMMUNITY): Payer: Self-pay | Admitting: Emergency Medicine

## 2016-02-13 ENCOUNTER — Emergency Department (HOSPITAL_COMMUNITY)
Admission: EM | Admit: 2016-02-13 | Discharge: 2016-02-13 | Disposition: A | Discharge status: Home / Self Care | Payer: BLUE CROSS/BLUE SHIELD | Attending: Emergency Medicine | Admitting: Emergency Medicine

## 2016-02-13 ENCOUNTER — Emergency Department (HOSPITAL_COMMUNITY): Payer: BLUE CROSS/BLUE SHIELD

## 2016-02-13 ENCOUNTER — Ambulatory Visit (HOSPITAL_BASED_OUTPATIENT_CLINIC_OR_DEPARTMENT_OTHER): Payer: BLUE CROSS/BLUE SHIELD

## 2016-02-13 DIAGNOSIS — R1084 Generalized abdominal pain: Secondary | ICD-10-CM | POA: Diagnosis present

## 2016-02-13 DIAGNOSIS — K921 Melena: Secondary | ICD-10-CM | POA: Insufficient documentation

## 2016-02-13 DIAGNOSIS — Z87891 Personal history of nicotine dependence: Secondary | ICD-10-CM | POA: Insufficient documentation

## 2016-02-13 DIAGNOSIS — K2970 Gastritis, unspecified, without bleeding: Secondary | ICD-10-CM | POA: Diagnosis not present

## 2016-02-13 DIAGNOSIS — E663 Overweight: Secondary | ICD-10-CM | POA: Diagnosis not present

## 2016-02-13 DIAGNOSIS — F418 Other specified anxiety disorders: Secondary | ICD-10-CM | POA: Diagnosis not present

## 2016-02-13 DIAGNOSIS — R221 Localized swelling, mass and lump, neck: Secondary | ICD-10-CM | POA: Diagnosis not present

## 2016-02-13 DIAGNOSIS — Z79899 Other long term (current) drug therapy: Secondary | ICD-10-CM | POA: Insufficient documentation

## 2016-02-13 DIAGNOSIS — K219 Gastro-esophageal reflux disease without esophagitis: Secondary | ICD-10-CM | POA: Insufficient documentation

## 2016-02-13 DIAGNOSIS — Z9049 Acquired absence of other specified parts of digestive tract: Secondary | ICD-10-CM | POA: Diagnosis not present

## 2016-02-13 DIAGNOSIS — Z862 Personal history of diseases of the blood and blood-forming organs and certain disorders involving the immune mechanism: Secondary | ICD-10-CM | POA: Diagnosis not present

## 2016-02-13 DIAGNOSIS — Z8572 Personal history of non-Hodgkin lymphomas: Secondary | ICD-10-CM | POA: Insufficient documentation

## 2016-02-13 DIAGNOSIS — R42 Dizziness and giddiness: Secondary | ICD-10-CM | POA: Diagnosis not present

## 2016-02-13 LAB — URINALYSIS, ROUTINE W REFLEX MICROSCOPIC
Bilirubin Urine: NEGATIVE
Glucose, UA: NEGATIVE mg/dL
KETONES UR: NEGATIVE mg/dL
LEUKOCYTES UA: NEGATIVE
NITRITE: NEGATIVE
PH: 5 (ref 5.0–8.0)
Protein, ur: NEGATIVE mg/dL
Specific Gravity, Urine: 1.006 (ref 1.005–1.030)

## 2016-02-13 LAB — CBC
HEMATOCRIT: 37.5 % (ref 36.0–46.0)
HEMOGLOBIN: 12.6 g/dL (ref 12.0–15.0)
MCH: 30.4 pg (ref 26.0–34.0)
MCHC: 33.6 g/dL (ref 30.0–36.0)
MCV: 90.4 fL (ref 78.0–100.0)
Platelets: 66 10*3/uL — ABNORMAL LOW (ref 150–400)
RBC: 4.15 MIL/uL (ref 3.87–5.11)
RDW: 13 % (ref 11.5–15.5)
WBC: 5.6 10*3/uL (ref 4.0–10.5)

## 2016-02-13 LAB — COMPREHENSIVE METABOLIC PANEL
ALT: 18 U/L (ref 14–54)
ANION GAP: 6 (ref 5–15)
AST: 26 U/L (ref 15–41)
Albumin: 4.3 g/dL (ref 3.5–5.0)
Alkaline Phosphatase: 98 U/L (ref 38–126)
BILIRUBIN TOTAL: 0.6 mg/dL (ref 0.3–1.2)
BUN: 19 mg/dL (ref 6–20)
CHLORIDE: 105 mmol/L (ref 101–111)
CO2: 28 mmol/L (ref 22–32)
Calcium: 9.5 mg/dL (ref 8.9–10.3)
Creatinine, Ser: 1 mg/dL (ref 0.44–1.00)
Glucose, Bld: 106 mg/dL — ABNORMAL HIGH (ref 65–99)
POTASSIUM: 4.3 mmol/L (ref 3.5–5.1)
Sodium: 139 mmol/L (ref 135–145)
TOTAL PROTEIN: 8.3 g/dL — AB (ref 6.5–8.1)

## 2016-02-13 LAB — URINE MICROSCOPIC-ADD ON: BACTERIA UA: NONE SEEN

## 2016-02-13 LAB — LIPASE, BLOOD: LIPASE: 31 U/L (ref 11–51)

## 2016-02-13 LAB — POC OCCULT BLOOD, ED: Fecal Occult Bld: POSITIVE — AB

## 2016-02-13 MED ORDER — SODIUM CHLORIDE 0.9 % IV BOLUS (SEPSIS)
1000.0000 mL | Freq: Once | INTRAVENOUS | Status: AC
  Administered 2016-02-13: 1000 mL via INTRAVENOUS

## 2016-02-13 MED ORDER — IOHEXOL 300 MG/ML  SOLN
100.0000 mL | Freq: Once | INTRAMUSCULAR | Status: AC | PRN
  Administered 2016-02-13: 100 mL via INTRAVENOUS

## 2016-02-13 MED ORDER — PANTOPRAZOLE SODIUM 40 MG IV SOLR
40.0000 mg | Freq: Once | INTRAVENOUS | Status: AC
  Administered 2016-02-13: 40 mg via INTRAVENOUS
  Filled 2016-02-13: qty 40

## 2016-02-13 MED ORDER — PANTOPRAZOLE SODIUM 20 MG PO TBEC: 20.0000 mg | DELAYED_RELEASE_TABLET | Freq: Two times a day (BID) | ORAL | Status: DC

## 2016-02-13 NOTE — ED Notes (Addendum)
Patient presents for diarrhea x4 days, this morning dark formed stool, also c/o lower abdominal cramping, and dizziness. Normal Head CT at PCP. Rates pain 3/10. Denies N/V, fever/chills, weakness, lightheadedness.

## 2016-02-13 NOTE — Telephone Encounter (Signed)
Per chart, pt went to the ER as advised.   

## 2016-02-13 NOTE — Telephone Encounter (Signed)
[facility] - Client TELEPHONE ADVICE RECORD [facility]  Patient Name: Laura Bender  DOB: October 07, 1956    Initial Comment Caller States she had 4 soft black stools this morning. diarrhea started over the weekend.    Nurse Assessment  Nurse: Wayne Sever, RN, Tillie Rung Date/Time (Eastern Time): 02/13/2016 11:39:52 AM  Confirm and document reason for call. If symptomatic, describe symptoms. You must click the next button to save text entered. ---Caller states she saw the doctor last week and they ran a bunch of test. She started with diarrhea after she left the office last week and is now having dark black stools. Denies taking any Pepto Bismol. She is having some abdominal cramping. Denies any fever  Has the patient traveled out of the country within the last 30 days? ---No  Does the patient have any new or worsening symptoms? ---Yes  Will a triage be completed? ---Yes  Related visit to physician within the last 2 weeks? ---Yes  Does the PT have any chronic conditions? (i.e. diabetes, asthma, etc.) ---Yes  List chronic conditions. ---ITP  Is this a behavioral health or substance abuse call? ---No     Guidelines    Guideline Title Affirmed Question Affirmed Notes  Diarrhea Black or tarry bowel movements (Exception: chronic-unchanged black-grey bowel movements AND is taking iron pills or Pepto-Bismol)    Final Disposition User   Go to ED Now Wayne Sever, RN, Tillie Rung    Referrals  GO TO FACILITY OTHER - SPECIFY   Disagree/Comply: Comply

## 2016-02-13 NOTE — Telephone Encounter (Signed)
Pt called in stating she had diarrhea over the weekend and has had 4 black stools this morning. Transferred to [facility] with [facility].

## 2016-02-13 NOTE — ED Notes (Signed)
MD at bedside. 

## 2016-02-13 NOTE — ED Notes (Signed)
Patient ready to be discharged. Dr. Lacinda Axon made aware.

## 2016-02-13 NOTE — Discharge Instructions (Signed)
Recommend ear nose and throat follow-up for your parotid gland. Phone number given. Additionally, I would recommend that you see a gastroenterologist to further evaluate the possibility of a stomach ulcer. Your primary care doctor can refer you to this specialist. Medication for ulcers. Return if worse in anyway.

## 2016-02-13 NOTE — ED Notes (Signed)
Discharge instructions, follow up care, and rx x1 reviewed with patient. Patient verbalized understanding. 

## 2016-02-14 NOTE — ED Provider Notes (Signed)
CSN: [id number]     Arrival date & time 02/13/16  1243 History   First MD Initiated Contact with Patient 02/13/16 1649     Chief Complaint  Patient presents with  . Abdominal Pain  . Blood In Stools     (Consider location/radiation/quality/duration/timing/severity/associated sxs/prior Treatment) HPI.Laura KitchenMarland KitchenMarland KitchenPatient presents with diffuse abdominal pain but more specifically in her epigastrium, dizziness, diarrhea for 5 days. Patient now states black stool. No history of nonsteroidal use. Past medical history includes ITP. She is also concerned about a knot on the back of her neck. No chest pain, dyspnea, fever, sweats, chills, dysuria.  Past Medical History  Diagnosis Date  . Clotting disorder ([place])   . Lipoma 03-04-12    Rt. back about scapular ares  . 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  '95- blow normal levels, but stable.  . Fibromyalgia 03-04-12    Sporadic pain  . Depression 03-04-12    tx. meds  . PONV (postoperative nausea and vomiting)   . Allergic state 04/17/2013  . Depression with anxiety 04/17/2013  . Preventative health care 04/17/2013  . GERD (gastroesophageal reflux disease) 08/17/2013  . Solitary pulmonary nodule 08/17/2013    LLL seen on CT in March 2014  . Preventative health care 08/17/2013  . Advanced care planning/counseling discussion 09/01/2014  . Overweight 03/06/2015  . Hyperlipidemia, mixed 10/16/2015   Past Surgical History  Procedure Laterality Date  . Splenectomy, total  1995  . Appendectomy  1995  . Irrigation and debridement abscess  03-04-12    x2- buttocks  . Parotid gland tumor excision Left     benign  . Breast surgery  03-04-12    12'12 -left needle biopsy(benign)-Titanium needle marker remains  . Mass excision  03/10/2012    Procedure: EXCISION MASS;  Surgeon: Odis Hollingshead, MD;  Location: [facility];  Service: General;  Laterality: N/A;  Removal of back lipoma  . Abdominal hysterectomy  1995    total, endometriosis, fibroid, ovarian  atrophy  . Colonoscopy  2008    WNL, Dr Cristina Gong  . Cholecystectomy N/A 04/24/2013    Procedure: LAPAROSCOPIC CHOLECYSTECTOMY WITH INTRAOPERATIVE CHOLANGIOGRAM;  Surgeon: Haywood Lasso, MD;  Location: [facility];  Service: General;  Laterality: N/A;   Family History  Problem Relation Age of Onset  . Heart disease Maternal Grandmother   . Heart disease Maternal Grandfather   . Heart attack Maternal Grandfather 62  . Heart disease Paternal Grandmother   . Heart attack Paternal Grandmother 86  . Heart disease Paternal Grandfather   . Cancer Paternal Grandfather     chest- smoker  . Diabetes Mother     type 2- controlled by diet  . Atrial fibrillation Father   . Hyperlipidemia Sister   . Depression Sister    Social History  Substance Use Topics  . Smoking status: Former Smoker -- 5 years    Quit date: 02/21/1983  . Smokeless tobacco: Never Used  . Alcohol Use: 0.0 oz/week    3-4 Cans of beer per week     Comment: weekends -beer   OB History    No data available     Review of Systems  All other systems reviewed and are negative.     Allergies  Codeine  Home Medications   Prior to Admission medications   Medication Sig Start Date End Date Taking? Authorizing Provider  acetaminophen (TYLENOL) 500 MG tablet Take 1,000 mg by mouth every 6 (six) hours as needed for pain.  Yes Historical Provider, MD  ALPRAZolam (XANAX) 0.25 MG tablet TAKE 1 TABLET BY MOUTH TWICE DAILY AS NEEDED FOR SLEEP OR ANXIETY 10/31/15  Yes [name], MD  Calcium Carb-Cholecalciferol (CALCIUM 600 + D PO) Take 1 tablet by mouth daily.   Yes Historical Provider, MD  Cholecalciferol (VITAMIN D) 2000 units CAPS Take 2,000 Units by mouth daily.   Yes Historical Provider, MD  clorazepate (TRANXENE) 7.5 MG tablet Take 1 tablet (7.5 mg total) by mouth at bedtime. 12/29/15  Yes Midge Minium, MD  docusate sodium (COLACE) 100 MG capsule Take 100 mg by mouth daily.   Yes Historical Provider, MD   FLUoxetine (PROZAC) 20 MG capsule TAKE 2 CAPSULES BY MOUTH DAILY 10/31/15  Yes [name], MD  meclizine (ANTIVERT) 25 MG tablet TAKE 1 TABLET (25 MG) BY MOUTH 3 TIMES A DAY AS NEEDED FOR DIZZINESS OR NAUSEA. 05/23/15  Yes [name], MD  Multiple Vitamins-Minerals (HAIR/SKIN/NAILS PO) Take 3 tablets by mouth daily.   Yes Historical Provider, MD  omeprazole (PRILOSEC) 20 MG capsule TAKE 1 CAPSULE (20 MG) BY MOUTH DAILY. Patient taking differently: TAKE 1 CAPSULE (20 MG) BY MOUTH OTHER DAY 09/26/15  Yes [name], MD  PREMARIN 1.25 MG tablet TAKE 1 TABLET BY MOUTH DAILY. Patient taking differently: Take 1 tablet by mouth every other day. 10/31/15  Yes [name], MD  ranitidine (ZANTAC) 150 MG tablet Take 150 mg by mouth at bedtime.   Yes Historical Provider, MD  traZODone (DESYREL) 50 MG tablet TAKE 1 AND 1/2 TABLETS (75 MG TOTAL) BY MOUTH AT BEDTIME. 12/06/15  Yes [name], MD  fluticasone (FLONASE) 50 MCG/ACT nasal spray Place 2 sprays into both nostrils daily. Patient not taking: Reported on 02/13/2016 02/10/14   Brunetta Jeans, PA-C  ondansetron (ZOFRAN) 4 MG tablet Take 1 tablet (4 mg total) by mouth every 6 (six) hours. Patient not taking: Reported on 02/13/2016 07/05/15   Montine Circle, PA-C  pantoprazole (PROTONIX) 20 MG tablet Take 1 tablet (20 mg total) by mouth 2 (two) times daily. 02/13/16   Nat Christen, MD   BP 120/73 mmHg  Pulse 65  Temp(Src) 98.5 F (36.9 C) (Oral)  Resp 16  SpO2 99% Physical Exam  Constitutional: She is oriented to person, place, and time. She appears well-developed and well-nourished.  HENT:  Head: Normocephalic and atraumatic.  Eyes: Conjunctivae and EOM are normal. Pupils are equal, round, and reactive to light.  Neck: Normal range of motion. Neck supple.  Cardiovascular: Normal rate and regular rhythm.   Pulmonary/Chest: Effort normal and breath sounds normal.  Abdominal: Soft. Bowel sounds are normal.  Minimal tenderness in  epigastrium.  Genitourinary:  Rectal exam: No masses, minimal stool, lightly heme pos  Musculoskeletal: Normal range of motion.  Neurological: She is alert and oriented to person, place, and time.  Skin: Skin is warm and dry.  Psychiatric: She has a normal mood and affect. Her behavior is normal.  Nursing note and vitals reviewed.   ED Course  Procedures (including critical care time) Labs Review Labs Reviewed  COMPREHENSIVE METABOLIC PANEL - Abnormal; Notable for the following:    Glucose, Bld 106 (*)    Total Protein 8.3 (*)    All other components within normal limits  CBC - Abnormal; Notable for the following:    Platelets 66 (*)    All other components within normal limits  URINALYSIS, ROUTINE W REFLEX MICROSCOPIC (NOT AT [facility]) - Abnormal; Notable  for the following:    Hgb urine dipstick TRACE (*)    All other components within normal limits  URINE MICROSCOPIC-ADD ON - Abnormal; Notable for the following:    Squamous Epithelial / LPF 0-5 (*)    All other components within normal limits  POC OCCULT BLOOD, ED - Abnormal; Notable for the following:    Fecal Occult Bld POSITIVE (*)    All other components within normal limits  LIPASE, BLOOD    Imaging Review Ct Soft Tissue Neck W Contrast  02/13/2016  CLINICAL DATA:  60 year old female with posterior neck painless palpable mass for 3 weeks. Subsequent encounter. Personal history of left parotid mass excision 15 years ago, reportedly benign. EXAM: CT NECK WITH CONTRAST TECHNIQUE: Multidetector CT imaging of the neck was performed using the standard protocol following the bolus administration of intravenous contrast. CONTRAST:  173mL OMNIPAQUE IOHEXOL 300 MG/ML  SOLN COMPARISON:  Neck soft tissue ultrasound 02/10/2016. Head CT 02/10/2016. FINDINGS: Pharynx and larynx: Mild motion artifact at the hypopharynx. Laryngeal and pharyngeal soft tissue contours are within normal limits. Negative parapharyngeal and retropharyngeal spaces.  Salivary glands: Negative sublingual space and submandibular glands. Right parotid gland within normal limits. There is hyperdense nodularity soft tissue nodularity along the posterior aspect of the left parotid gland extending from just below the mastoid tip to the inferior aspect of the gland, measuring between 11 and 15 mm thickness. See sagittal image 67. The finding is less apparent on axial images but note asymmetry of the inferior most parotid tissue on series 3, image 47. This inferior-most parotid asymmetry appears gland like on coronal images and encompasses 16-18 mm diameter (coronal image 31). There is no overlying mild postoperative soft tissue scarring extending from the posterior and inferior parotid gland to the dermis. Thyroid: Negative. Lymph nodes: Posterior Palpable area of concern marked on the topogram image corresponding to series 3, image 55. At this level there is a semi circular fat density fairly circumscribed mass of the deep subcutaneous fat overlying the muscle layer encompassing 13 x 34 x 17 mm (AP by transverse by CC) compatible with benign appearing lipoma. No associated soft tissue inflammation. Bilateral level 5 lymph nodes are within normal limits measuring up to 6 mm. Other bilateral cervical lymph node stations are within normal limits. Vascular: Major vascular structures in the neck and at the skullbase are patent. Limited intracranial: Negative. Visualized orbits: Negative. Mastoids and visualized paranasal sinuses: Clear. Skeleton: No acute osseous abnormality identified. Incidental torus palatini S. cervical spine disc and endplate degeneration. Upper chest: No superior mediastinal lymphadenopathy. Negative lung apices. IMPRESSION: 1. Palpable area of concern along the posterior neck corresponds to a a benign lipoma 1.3 x 3.4 x 1.7 cm. 2. Abnormal soft tissue nodularity along the posterior and especially inferior left parotid gland. 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 3. Otherwise negative neck CT. Electronically Signed   By: Genevie Ann M.D.   On: 02/13/2016 18:43   I have personally reviewed and evaluated these images and lab results as part of my medical decision-making.   EKG Interpretation None      MDM   Final diagnoses:  Gastritis    Patient has been hemodynamically stable in the emergency department. IV Protonix started. No episodes of diarrhea in the ED. Additionally, at patient's request, I did a CT scan of her neck. All the results were discussed with the  patient and her significant other, including the need for follow-up of her left parotid gland. Discharge medications Protonix.  Patient will follow up with her primary care physician, ENT, gastroenterologist.    Nat Christen, MD 02/14/16 [phone]

## 2016-02-17 LAB — HM COLONOSCOPY

## 2016-02-19 DIAGNOSIS — R229 Localized swelling, mass and lump, unspecified: Secondary | ICD-10-CM | POA: Insufficient documentation

## 2016-02-19 DIAGNOSIS — R079 Chest pain, unspecified: Secondary | ICD-10-CM | POA: Insufficient documentation

## 2016-02-19 NOTE — Assessment & Plan Note (Signed)
Avoid offending foods, take probiotics. Do not eat large meals in late evening and consider raising head of bed. Take pantoprazole and ranitidine

## 2016-02-19 NOTE — Assessment & Plan Note (Signed)
At base of posterior neck near spine is irritating. Proceed with imaging and consider removing due to discomfort.

## 2016-02-19 NOTE — Assessment & Plan Note (Signed)
Encouraged heart healthy diet, increase exercise, avoid trans fats, consider a krill oil cap daily 

## 2016-02-19 NOTE — Assessment & Plan Note (Signed)
Encouraged DASH diet, decrease po intake and increase exercise as tolerated. Needs 7-8 hours of sleep nightly. Avoid trans fats, eat small, frequent meals every 4-5 hours with lean proteins, complex carbs and healthy fats. Minimize simple carbs 

## 2016-02-19 NOTE — Assessment & Plan Note (Signed)
EKG is normal today. Likely not cardiac but patient aware to seek care if recurs and does not resolve. Likely a combination of reflux and anxiety. Will treat these and monitor

## 2016-02-19 NOTE — Assessment & Plan Note (Signed)
Stable, asymptomatic. 

## 2016-03-07 MED FILL — PREMARIN 1.25 MG TABLET: 1.25 | 30 days supply | Qty: 30 | Fill #4

## 2016-03-07 MED FILL — FLUoxetine HCL 20 MG CAPS: 20 | 30 days supply | Qty: 60 | Fill #4

## 2016-03-07 MED FILL — traZODone HCL 50 MG TABS: 50 | 90 days supply | Qty: 135 | Fill #1

## 2016-03-08 ENCOUNTER — Other Ambulatory Visit: Payer: Self-pay | Admitting: Family Medicine

## 2016-03-08 ENCOUNTER — Encounter: Payer: Self-pay | Admitting: Family Medicine

## 2016-03-08 MED ORDER — ALPRAZOLAM 0.25 MG PO TABS
ORAL_TABLET | ORAL | Status: DC
Start: 2016-03-08 — End: 2016-09-27

## 2016-03-08 MED FILL — ALPRAZolam 0.25 MG TABS: 0.25 | 10 days supply | Qty: 20 | Fill #0

## 2016-03-08 NOTE — Telephone Encounter (Signed)
Requesting:  Alprazolam Contract   none UDS    none Last OV   02/07/2016 Last Refill   #20 with 2 refills on 10/31/2015  Please Advise

## 2016-03-08 NOTE — Telephone Encounter (Signed)
Faxed hardcopy to [employer] [facility] [employer].

## 2016-03-21 DIAGNOSIS — K625 Hemorrhage of anus and rectum: Secondary | ICD-10-CM | POA: Diagnosis not present

## 2016-03-27 ENCOUNTER — Other Ambulatory Visit: Payer: BLUE CROSS/BLUE SHIELD

## 2016-03-27 ENCOUNTER — Other Ambulatory Visit (INDEPENDENT_AMBULATORY_CARE_PROVIDER_SITE_OTHER): Payer: BLUE CROSS/BLUE SHIELD

## 2016-03-27 DIAGNOSIS — E663 Overweight: Secondary | ICD-10-CM | POA: Diagnosis not present

## 2016-03-27 DIAGNOSIS — E782 Mixed hyperlipidemia: Secondary | ICD-10-CM | POA: Diagnosis not present

## 2016-03-28 ENCOUNTER — Telehealth: Payer: Self-pay | Admitting: *Deleted

## 2016-03-28 LAB — CBC
HCT: 36 % (ref 36.0–46.0)
Hemoglobin: 12.2 g/dL (ref 12.0–15.0)
MCHC: 33.8 g/dL (ref 30.0–36.0)
MCV: 91.8 fl (ref 78.0–100.0)
Platelets: 48 10*3/uL — CL (ref 150.0–400.0)
RBC: 3.92 Mil/uL (ref 3.87–5.11)
RDW: 13.5 % (ref 11.5–15.5)
WBC: 7 10*3/uL (ref 4.0–10.5)

## 2016-03-28 LAB — LIPID PANEL
Cholesterol: 182 mg/dL (ref 0–200)
HDL: 56.7 mg/dL (ref >39.00)
NONHDL: 124.94
Total CHOL/HDL Ratio: 3
Triglycerides: 212 mg/dL — ABNORMAL HIGH (ref 0.0–149.0)
VLDL: 42.4 mg/dL — ABNORMAL HIGH (ref 0.0–40.0)

## 2016-03-28 LAB — COMPREHENSIVE METABOLIC PANEL
ALT: 13 U/L (ref 0–35)
AST: 19 U/L (ref 0–37)
Albumin: 4 g/dL (ref 3.5–5.2)
Alkaline Phosphatase: 86 U/L (ref 39–117)
BUN: 21 mg/dL (ref 6–23)
CO2: 30 mEq/L (ref 19–32)
Calcium: 9.4 mg/dL (ref 8.4–10.5)
Chloride: 102 mEq/L (ref 96–112)
Creatinine, Ser: 0.85 mg/dL (ref 0.40–1.20)
GFR: 72.69 mL/min (ref >60.00)
Glucose, Bld: 95 mg/dL (ref 70–99)
Potassium: 4.2 mEq/L (ref 3.5–5.1)
Sodium: 137 mEq/L (ref 135–145)
Total Bilirubin: 0.3 mg/dL (ref 0.2–1.2)
Total Protein: 7.8 g/dL (ref 6.0–8.3)

## 2016-03-28 LAB — LDL CHOLESTEROL, DIRECT: LDL DIRECT: 86 mg/dL

## 2016-03-28 LAB — TSH: TSH: 1.23 u[IU]/mL (ref 0.35–4.50)

## 2016-03-28 NOTE — Telephone Encounter (Signed)
Caller: Jeani Hawking from lab  Critical result: platelets 48,000  Critical value read back w/ caller.

## 2016-03-28 NOTE — Telephone Encounter (Signed)
Called pt- she has a history of ITP s/p splenectomy.  This platelet count is not out of the ordinary for her. Called pt- she agrees that this plt count is ok for her. She does not have any sx such as bleeding. Will forward to Dr. Charlett Blake who is out today

## 2016-03-29 ENCOUNTER — Other Ambulatory Visit: Payer: Self-pay | Admitting: Family Medicine

## 2016-03-29 MED ORDER — CLORAZEPATE DIPOTASSIUM 7.5 MG PO TABS: 7.5000 mg | ORAL_TABLET | Freq: Every day | ORAL | Status: DC

## 2016-03-29 NOTE — Telephone Encounter (Signed)
Last refill #90 with 0 refills on 12/29/2015 Last office visit 02/07/2016 Refill done to [facility]

## 2016-03-30 MED FILL — CLORAZEPATE 7.5 MG TABLET: 7.5 | 90 days supply | Qty: 90 | Fill #0

## 2016-03-30 NOTE — Telephone Encounter (Signed)
Faxed hardcopy for Clorazepate to [place]

## 2016-04-03 ENCOUNTER — Encounter: Payer: Self-pay | Admitting: Family Medicine

## 2016-04-03 ENCOUNTER — Ambulatory Visit (INDEPENDENT_AMBULATORY_CARE_PROVIDER_SITE_OTHER): Payer: BLUE CROSS/BLUE SHIELD | Admitting: Family Medicine

## 2016-04-03 VITALS — BP 112/72 | HR 71 | Temp 98.4°F | Ht 68.0 in | Wt 184.1 lb

## 2016-04-03 DIAGNOSIS — E782 Mixed hyperlipidemia: Secondary | ICD-10-CM

## 2016-04-03 DIAGNOSIS — F418 Other specified anxiety disorders: Secondary | ICD-10-CM | POA: Diagnosis not present

## 2016-04-03 DIAGNOSIS — D693 Immune thrombocytopenic purpura: Secondary | ICD-10-CM

## 2016-04-03 DIAGNOSIS — D171 Benign lipomatous neoplasm of skin and subcutaneous tissue of trunk: Secondary | ICD-10-CM

## 2016-04-03 DIAGNOSIS — Z0000 Encounter for general adult medical examination without abnormal findings: Secondary | ICD-10-CM

## 2016-04-03 DIAGNOSIS — E663 Overweight: Secondary | ICD-10-CM | POA: Diagnosis not present

## 2016-04-03 DIAGNOSIS — M797 Fibromyalgia: Secondary | ICD-10-CM

## 2016-04-03 DIAGNOSIS — K219 Gastro-esophageal reflux disease without esophagitis: Secondary | ICD-10-CM

## 2016-04-03 NOTE — Assessment & Plan Note (Signed)
Persistent, uncomfortable at this time. Will refer to Dr Elinor Parkinson

## 2016-04-03 NOTE — Progress Notes (Signed)
Subjective:    [name], female    DOB: 1956-09-14, 60 y.o.   MRN: [id number]  Chief Complaint  Patient presents with  . Follow-up    HPI Patient is in today for follow up. She feels well today but she did have a bout of diarrhea after starting her probiotics after her last visit. She underwent colonoscopy with her gastroenterologist but no cause was found. This has resolved. No fevers, anorexia, diarrhea or nausea. No other recent illness. Denies CP/palp/SOB/HA/congestion/fevers or GU c/o. Taking meds as prescribed  Past Medical History  Diagnosis Date  . Clotting disorder ([place])   . Lipoma 03-04-12    Rt. back about scapular ares  . 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  '95- blow normal levels, but stable.  . Fibromyalgia 03-04-12    Sporadic pain  . Depression 03-04-12    tx. meds  . PONV (postoperative nausea and vomiting)   . Allergic state 04/17/2013  . Depression with anxiety 04/17/2013  . Preventative health care 04/17/2013  . GERD (gastroesophageal reflux disease) 08/17/2013  . Solitary pulmonary nodule 08/17/2013    LLL seen on CT in March 2014  . Preventative health care 08/17/2013  . Advanced care planning/counseling discussion 09/01/2014  . Overweight 03/06/2015  . Hyperlipidemia, mixed 10/16/2015    Past Surgical History  Procedure Laterality Date  . Splenectomy, total  1995  . Appendectomy  1995  . Irrigation and debridement abscess  03-04-12    x2- buttocks  . Parotid gland tumor excision Left     benign  . Breast surgery  03-04-12    12'12 -left needle biopsy(benign)-Titanium needle marker remains  . Mass excision  03/10/2012    Procedure: EXCISION MASS;  Surgeon: Odis Hollingshead, MD;  Location: [facility];  Service: General;  Laterality: N/A;  Removal of back lipoma  . Abdominal hysterectomy  1995    total, endometriosis, fibroid, ovarian atrophy  . Colonoscopy  2008    WNL, Dr Cristina Gong  . Cholecystectomy N/A 04/24/2013   Procedure: LAPAROSCOPIC CHOLECYSTECTOMY WITH INTRAOPERATIVE CHOLANGIOGRAM;  Surgeon: Haywood Lasso, MD;  Location: [facility];  Service: General;  Laterality: N/A;    Family History  Problem Relation Age of Onset  . Heart disease Maternal Grandmother   . Heart disease Maternal Grandfather   . Heart attack Maternal Grandfather 62  . Heart disease Paternal Grandmother   . Heart attack Paternal Grandmother 86  . Heart disease Paternal Grandfather   . Cancer Paternal Grandfather     chest- smoker  . Diabetes Mother     type 2- controlled by diet  . Atrial fibrillation Father   . Hyperlipidemia Sister   . Depression Sister     Social History   Social History  . Marital Status: Single    Spouse Name: N/A  . Number of Children: N/A  . Years of Education: N/A   Occupational History  . Not on file.   Social History Main Topics  . Smoking status: Former Smoker -- 5 years    Quit date: 02/21/1983  . Smokeless tobacco: Never Used  . Alcohol Use: 0.0 oz/week    3-4 Cans of beer per week     Comment: weekends -beer  . Drug Use: No  . Sexual Activity: No     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 lives with partner    Other Topics Concern  . Not on file  Social History Narrative   Works at [employer]    Outpatient Prescriptions Prior to Visit  Medication Sig Dispense Refill  . acetaminophen (TYLENOL) 500 MG tablet Take 1,000 mg by mouth every 6 (six) hours as needed for pain.    Marland Kitchen ALPRAZolam (XANAX) 0.25 MG tablet TAKE 1 TABLET BY MOUTH TWICE DAILY AS NEEDED FOR SLEEP OR ANXIETY 20 tablet 2  . Calcium Carb-Cholecalciferol (CALCIUM 600 + D PO) Take 1 tablet by mouth daily.    . Cholecalciferol (VITAMIN D) 2000 units CAPS Take 2,000 Units by mouth daily.    . clorazepate (TRANXENE) 7.5 MG tablet Take 1 tablet (7.5 mg total) by mouth at bedtime. 90 tablet 0  . docusate sodium (COLACE) 100 MG capsule Take 100 mg by mouth  daily.    Marland Kitchen FLUoxetine (PROZAC) 20 MG capsule TAKE 2 CAPSULES BY MOUTH DAILY 60 capsule 5  . fluticasone (FLONASE) 50 MCG/ACT nasal spray Place 2 sprays into both nostrils daily. 16 g 6  . meclizine (ANTIVERT) 25 MG tablet TAKE 1 TABLET (25 MG) BY MOUTH 3 TIMES A DAY AS NEEDED FOR DIZZINESS OR NAUSEA. 30 tablet 0  . Multiple Vitamins-Minerals (HAIR/SKIN/NAILS PO) Take 3 tablets by mouth daily.    . ondansetron (ZOFRAN) 4 MG tablet Take 1 tablet (4 mg total) by mouth every 6 (six) hours. 12 tablet 0  . pantoprazole (PROTONIX) 20 MG tablet Take 1 tablet (20 mg total) by mouth 2 (two) times daily. 30 tablet 0  . PREMARIN 1.25 MG tablet TAKE 1 TABLET BY MOUTH DAILY. (Patient taking differently: Take 1 tablet by mouth every other day.) 30 tablet 5  . ranitidine (ZANTAC) 150 MG tablet Take 150 mg by mouth at bedtime.    . traZODone (DESYREL) 50 MG tablet TAKE 1 AND 1/2 TABLETS (75 MG TOTAL) BY MOUTH AT BEDTIME. 135 tablet 1   No facility-administered medications prior to visit.    Allergies  Allergen Reactions  . Codeine Nausea And Vomiting    Review of Systems  Constitutional: Negative for fever and malaise/fatigue.  HENT: Negative for congestion.   Eyes: Negative for blurred vision.  Respiratory: Negative for shortness of breath.   Cardiovascular: Negative for chest pain, palpitations and leg swelling.  Gastrointestinal: Negative for nausea, abdominal pain and blood in stool.  Genitourinary: Negative for dysuria and frequency.  Musculoskeletal: Negative for falls.  Skin: Negative for rash.  Neurological: Negative for dizziness, loss of consciousness and headaches.  Endo/Heme/Allergies: Negative for environmental allergies.  Psychiatric/Behavioral: Positive for depression. The patient is nervous/anxious.        Objective:    Physical Exam  Constitutional: She is oriented to person, place, and time. She appears well-developed and well-nourished. No distress.  HENT:  Head:  Normocephalic and atraumatic.  Eyes: Conjunctivae are normal.  Neck: Neck supple. No thyromegaly present.  Cardiovascular: Normal rate, regular rhythm and normal heart sounds.   No murmur heard. Pulmonary/Chest: Effort normal and breath sounds normal. No respiratory distress.  Abdominal: Soft. Bowel sounds are normal. She exhibits no distension and no mass. There is no tenderness.  Musculoskeletal: She exhibits no edema.  Lymphadenopathy:    She has no cervical adenopathy.  Neurological: She is alert and oriented to person, place, and time.  Skin: Skin is warm and dry.  Psychiatric: She has a normal mood and affect. Her behavior is normal.    BP 112/72 mmHg  Pulse 71  Temp(Src) 98.4 F (36.9 C) (Oral)  Ht 5\' 8"  (1.727  m)  Wt 184 lb 2 oz (83.519 kg)  BMI 28.00 kg/m2  SpO2 97% Wt Readings from Last 3 Encounters:  04/03/16 184 lb 2 oz (83.519 kg)  02/07/16 186 lb 4 oz (84.482 kg)  10/07/15 189 lb 6 oz (85.9 kg)     Lab Results  Component Value Date   WBC 7.0 03/27/2016   HGB 12.2 03/27/2016   HCT 36.0 03/27/2016   PLT 48.0 Repeated and verified X2.* 03/27/2016   GLUCOSE 95 03/27/2016   CHOL 182 03/27/2016   TRIG 212.0* 03/27/2016   HDL 56.70 03/27/2016   LDLDIRECT 86.0 03/27/2016   LDLCALC 115* 09/29/2015   ALT 13 03/27/2016   AST 19 03/27/2016   NA 137 03/27/2016   K 4.2 03/27/2016   CL 102 03/27/2016   CREATININE 0.85 03/27/2016   BUN 21 03/27/2016   CO2 30 03/27/2016   TSH 1.23 03/27/2016   INR 0.90 03/04/2012   HGBA1C 5.1 10/19/2013    Lab Results  Component Value Date   TSH 1.23 03/27/2016   Lab Results  Component Value Date   WBC 7.0 03/27/2016   HGB 12.2 03/27/2016   HCT 36.0 03/27/2016   MCV 91.8 03/27/2016   PLT 48.0 Repeated and verified X2.* 03/27/2016   Lab Results  Component Value Date   NA 137 03/27/2016   K 4.2 03/27/2016   CO2 30 03/27/2016   GLUCOSE 95 03/27/2016   BUN 21 03/27/2016   CREATININE 0.85 03/27/2016   BILITOT 0.3  03/27/2016   ALKPHOS 86 03/27/2016   AST 19 03/27/2016   ALT 13 03/27/2016   PROT 7.8 03/27/2016   ALBUMIN 4.0 03/27/2016   CALCIUM 9.4 03/27/2016   ANIONGAP 6 02/13/2016   GFR 72.69 03/27/2016   Lab Results  Component Value Date   CHOL 182 03/27/2016   Lab Results  Component Value Date   HDL 56.70 03/27/2016   Lab Results  Component Value Date   LDLCALC 115* 09/29/2015   Lab Results  Component Value Date   TRIG 212.0* 03/27/2016   Lab Results  Component Value Date   CHOLHDL 3 03/27/2016   Lab Results  Component Value Date   HGBA1C 5.1 10/19/2013       Assessment & Plan:   Problem List Items Addressed This Visit    Depression with anxiety - Primary    Still is struggling with work and stress but feels she is managing well with the meds.      Relevant Orders   TSH   CBC   Lipid panel   Comprehensive metabolic panel   Hepatitis C Antibody   Fibromyalgia   Relevant Orders   TSH   CBC   Lipid panel   Comprehensive metabolic panel   Hepatitis C Antibody   GERD (gastroesophageal reflux disease)    Avoid offending foods, start probiotics. Do not eat large meals in late evening and consider raising head of bed. Had a bad spell of diarrhea and underwent work up including colonoscopy with her gastroenterologist Dr Wallis Mart. It was reportedly normal. Will request records. Symptoms now resolved      Relevant Orders   TSH   CBC   Lipid panel   Comprehensive metabolic panel   Hepatitis C Antibody   Hyperlipidemia, mixed    Encouraged heart healthy diet, increase exercise, avoid trans fats, consider a krill oil cap daily      Relevant Orders   TSH   CBC   Lipid panel   Comprehensive  metabolic panel   Hepatitis C Antibody   ITP (idiopathic thrombocytopenic purpura) s/p splenectomy 1995 (Chronic)    Continues to follow with hematology and be asymptomatic, numbers stable will continue to monitor      Relevant Orders   TSH   CBC   Lipid panel    Comprehensive metabolic panel   Hepatitis C Antibody   Lipoma of back    Persistent, uncomfortable at this time. Will refer to Dr Elinor Parkinson      Relevant Orders   Ambulatory referral to General Surgery   TSH   CBC   Lipid panel   Comprehensive metabolic panel   Hepatitis C Antibody   Overweight   Relevant Orders   TSH   CBC   Lipid panel   Comprehensive metabolic panel   Hepatitis C Antibody   Preventative health care    Patient declines pap, hep c and hiv testing due to low risk.      Relevant Orders   TSH   CBC   Lipid panel   Comprehensive metabolic panel   Hepatitis C Antibody      I am having Ms. Manz maintain her acetaminophen, fluticasone, meclizine, ondansetron, PREMARIN, FLUoxetine, traZODone, ranitidine, Multiple Vitamins-Minerals (HAIR/SKIN/NAILS PO), Calcium Carb-Cholecalciferol (CALCIUM 600 + D PO), docusate sodium, Vitamin D, pantoprazole, ALPRAZolam, and clorazepate.  No orders of the defined types were placed in this encounter.     Penni Homans, MD

## 2016-04-03 NOTE — Assessment & Plan Note (Signed)
Still is struggling with work and stress but feels she is managing well with the meds.

## 2016-04-03 NOTE — Patient Instructions (Signed)
Fish or flaxseed or krill oil caps daily will help the triglycerides. Continue exercising.   Cholesterol Cholesterol is a white, waxy, fat-like substance needed by your body in small amounts. The liver makes all the cholesterol you need. Cholesterol is carried from the liver by the blood through the blood vessels. Deposits of cholesterol (plaque) may build up on blood vessel walls. These make the arteries narrower and stiffer. Cholesterol plaques increase the risk for heart attack and stroke.  You cannot feel your cholesterol level even if it is very high. The only way to know it is high is with a blood test. Once you know your cholesterol levels, you should keep a record of the test results. Work with your health care provider to keep your levels in the desired range.  WHAT DO THE RESULTS MEAN?  Total cholesterol is a rough measure of all the cholesterol in your blood.   LDL is the so-called bad cholesterol. This is the type that deposits cholesterol in the walls of the arteries. You want this level to be low.   HDL is the good cholesterol because it cleans the arteries and carries the LDL away. You want this level to be high.  Triglycerides are fat that the body can either burn for energy or store. High levels are closely linked to heart disease.  WHAT ARE THE DESIRED LEVELS OF CHOLESTEROL?  Total cholesterol below 200.   LDL below 100 for people at risk, below 70 for those at very high risk.   HDL above 50 is good, above 60 is best.   Triglycerides below 150.  HOW CAN I LOWER MY CHOLESTEROL?  Diet. Follow your diet programs as directed by your health care provider.   Choose fish or white meat chicken and [place], roasted or baked. Limit fatty cuts of red meat, fried foods, and processed meats, such as sausage and lunch meats.   Eat lots of fresh fruits and vegetables.  Choose whole grains, beans, pasta, potatoes, and cereals.   Use only small amounts of olive, corn, or  canola oils.   Avoid butter, mayonnaise, shortening, or palm kernel oils.  Avoid foods with trans fats.   Drink skim or nonfat milk and eat low-fat or nonfat yogurt and cheeses. Avoid whole milk, cream, ice cream, egg yolks, and full-fat cheeses.   Healthy desserts include angel food cake, ginger snaps, animal crackers, hard candy, popsicles, and low-fat or nonfat frozen yogurt. Avoid pastries, cakes, pies, and cookies.   Exercise. Follow your exercise programs as directed by your health care provider.   A regular program helps decrease LDL and raise HDL.   A regular program helps with weight control.   Do things that increase your activity level like gardening, walking, or taking the stairs. Ask your health care provider about how you can be more active in your daily life.   Medicine. Take medicine only as directed by your health care provider.   Medicine may be prescribed by your health care provider to help lower cholesterol and decrease the risk for heart disease.   If you have several risk factors, you may need medicine even if your levels are normal.   This information is not intended to replace advice given to you by your health care provider. Make sure you discuss any questions you have with your health care provider.   Document Released: 08/14/2001 Document Revised: 12/10/2014 Document Reviewed: 09/02/2013 Elsevier Interactive Patient Education [employer].

## 2016-04-03 NOTE — Assessment & Plan Note (Signed)
Encouraged heart healthy diet, increase exercise, avoid trans fats, consider a krill oil cap daily 

## 2016-04-03 NOTE — Progress Notes (Signed)
Pre visit review using our clinic review tool, if applicable. No additional management support is needed unless otherwise documented below in the visit note. 

## 2016-04-03 NOTE — Assessment & Plan Note (Signed)
Continues to follow with hematology and be asymptomatic, numbers stable will continue to monitor

## 2016-04-03 NOTE — Assessment & Plan Note (Addendum)
Avoid offending foods, start probiotics. Do not eat large meals in late evening and consider raising head of bed. Had a bad spell of diarrhea and underwent work up including colonoscopy with her gastroenterologist Dr Wallis Mart. It was reportedly normal. Will request records. Symptoms now resolved

## 2016-04-03 NOTE — Assessment & Plan Note (Signed)
Patient declines pap, hep c and hiv testing due to low risk.

## 2016-04-13 MED FILL — PREMARIN 1.25 MG TABLET: 1.25 | 30 days supply | Qty: 30 | Fill #5

## 2016-04-13 MED FILL — FLUoxetine HCL 20 MG CAPS: 20 | 30 days supply | Qty: 60 | Fill #5

## 2016-04-16 DIAGNOSIS — R221 Localized swelling, mass and lump, neck: Secondary | ICD-10-CM | POA: Diagnosis not present

## 2016-04-27 ENCOUNTER — Telehealth: Payer: Self-pay | Admitting: *Deleted

## 2016-04-27 NOTE — Telephone Encounter (Signed)
Forwarded to Dr. Aletha Halim. JG//CMA

## 2016-05-01 ENCOUNTER — Encounter: Payer: Self-pay | Admitting: Family Medicine

## 2016-05-15 DIAGNOSIS — D170 Benign lipomatous neoplasm of skin and subcutaneous tissue of head, face and neck: Secondary | ICD-10-CM | POA: Diagnosis not present

## 2016-05-24 ENCOUNTER — Other Ambulatory Visit: Payer: Self-pay | Admitting: Family Medicine

## 2016-05-24 MED FILL — FLUoxetine HCL 20 MG CAPS: 20 | 30 days supply | Qty: 60 | Fill #0

## 2016-05-24 MED FILL — ALPRAZolam 0.25 MG TABS: 0.25 | 10 days supply | Qty: 20 | Fill #1

## 2016-05-24 MED FILL — PREMARIN 1.25 MG TABLET: 1.25 | 30 days supply | Qty: 30 | Fill #0

## 2016-06-19 ENCOUNTER — Observation Stay (HOSPITAL_COMMUNITY): Payer: BLUE CROSS/BLUE SHIELD

## 2016-06-19 ENCOUNTER — Encounter (HOSPITAL_COMMUNITY): Payer: Self-pay | Admitting: Emergency Medicine

## 2016-06-19 ENCOUNTER — Emergency Department (HOSPITAL_COMMUNITY): Payer: BLUE CROSS/BLUE SHIELD

## 2016-06-19 ENCOUNTER — Observation Stay (HOSPITAL_COMMUNITY)
Admission: EM | Admit: 2016-06-19 | Discharge: 2016-06-20 | Disposition: A | Discharge status: Home / Self Care | Payer: BLUE CROSS/BLUE SHIELD | Attending: Family Medicine | Admitting: Family Medicine

## 2016-06-19 ENCOUNTER — Telehealth: Payer: Self-pay | Admitting: Family Medicine

## 2016-06-19 DIAGNOSIS — R2990 Unspecified symptoms and signs involving the nervous system: Secondary | ICD-10-CM | POA: Diagnosis present

## 2016-06-19 DIAGNOSIS — K219 Gastro-esophageal reflux disease without esophagitis: Secondary | ICD-10-CM | POA: Insufficient documentation

## 2016-06-19 DIAGNOSIS — Z6827 Body mass index (BMI) 27.0-27.9, adult: Secondary | ICD-10-CM | POA: Insufficient documentation

## 2016-06-19 DIAGNOSIS — Z79899 Other long term (current) drug therapy: Secondary | ICD-10-CM | POA: Insufficient documentation

## 2016-06-19 DIAGNOSIS — S0990XA Unspecified injury of head, initial encounter: Secondary | ICD-10-CM | POA: Diagnosis not present

## 2016-06-19 DIAGNOSIS — Z87891 Personal history of nicotine dependence: Secondary | ICD-10-CM | POA: Insufficient documentation

## 2016-06-19 DIAGNOSIS — F418 Other specified anxiety disorders: Secondary | ICD-10-CM | POA: Diagnosis not present

## 2016-06-19 DIAGNOSIS — R531 Weakness: Secondary | ICD-10-CM | POA: Diagnosis not present

## 2016-06-19 DIAGNOSIS — E669 Obesity, unspecified: Secondary | ICD-10-CM | POA: Diagnosis not present

## 2016-06-19 DIAGNOSIS — E782 Mixed hyperlipidemia: Secondary | ICD-10-CM | POA: Diagnosis not present

## 2016-06-19 DIAGNOSIS — R0789 Other chest pain: Secondary | ICD-10-CM | POA: Insufficient documentation

## 2016-06-19 DIAGNOSIS — G459 Transient cerebral ischemic attack, unspecified: Secondary | ICD-10-CM

## 2016-06-19 DIAGNOSIS — D693 Immune thrombocytopenic purpura: Secondary | ICD-10-CM | POA: Diagnosis not present

## 2016-06-19 DIAGNOSIS — R079 Chest pain, unspecified: Secondary | ICD-10-CM | POA: Diagnosis present

## 2016-06-19 DIAGNOSIS — E663 Overweight: Secondary | ICD-10-CM | POA: Diagnosis present

## 2016-06-19 DIAGNOSIS — R42 Dizziness and giddiness: Secondary | ICD-10-CM | POA: Diagnosis not present

## 2016-06-19 DIAGNOSIS — Z7951 Long term (current) use of inhaled steroids: Secondary | ICD-10-CM | POA: Insufficient documentation

## 2016-06-19 DIAGNOSIS — G43509 Persistent migraine aura without cerebral infarction, not intractable, without status migrainosus: Secondary | ICD-10-CM

## 2016-06-19 DIAGNOSIS — F1010 Alcohol abuse, uncomplicated: Secondary | ICD-10-CM | POA: Insufficient documentation

## 2016-06-19 DIAGNOSIS — G43109 Migraine with aura, not intractable, without status migrainosus: Secondary | ICD-10-CM | POA: Diagnosis not present

## 2016-06-19 HISTORY — DX: Migraine, unspecified, not intractable, without status migrainosus: G43.909

## 2016-06-19 HISTORY — DX: Anxiety disorder, unspecified: F41.9

## 2016-06-19 HISTORY — DX: Personal history of other medical treatment: Z92.89

## 2016-06-19 HISTORY — DX: Alcohol abuse, uncomplicated: F10.10

## 2016-06-19 LAB — DIFFERENTIAL
BASOS ABS: 0.1 10*3/uL (ref 0.0–0.1)
Basophils Relative: 1 %
Eosinophils Absolute: 0.1 10*3/uL (ref 0.0–0.7)
Eosinophils Relative: 1 %
LYMPHS ABS: 2.6 10*3/uL (ref 0.7–4.0)
LYMPHS PCT: 42 %
Monocytes Absolute: 0.5 10*3/uL (ref 0.1–1.0)
Monocytes Relative: 7 %
NEUTROS PCT: 48 %
Neutro Abs: 3.1 10*3/uL (ref 1.7–7.7)

## 2016-06-19 LAB — CBC
HCT: 38.9 % (ref 36.0–46.0)
HEMOGLOBIN: 12.6 g/dL (ref 12.0–15.0)
MCH: 30.5 pg (ref 26.0–34.0)
MCHC: 32.4 g/dL (ref 30.0–36.0)
MCV: 94.2 fL (ref 78.0–100.0)
Platelets: 47 10*3/uL — ABNORMAL LOW (ref 150–400)
RBC: 4.13 MIL/uL (ref 3.87–5.11)
RDW: 13.2 % (ref 11.5–15.5)
WBC: 6.3 10*3/uL (ref 4.0–10.5)

## 2016-06-19 LAB — I-STAT TROPONIN, ED: TROPONIN I, POC: 0 ng/mL (ref 0.00–0.08)

## 2016-06-19 LAB — LIPID PANEL
CHOLESTEROL: 201 mg/dL — AB (ref 0–200)
HDL: 66 mg/dL (ref >40)
LDL Cholesterol: 109 mg/dL — ABNORMAL HIGH (ref 0–99)
Total CHOL/HDL Ratio: 3 RATIO
Triglycerides: 130 mg/dL (ref <150)
VLDL: 26 mg/dL (ref 0–40)

## 2016-06-19 LAB — COMPREHENSIVE METABOLIC PANEL
ALBUMIN: 4 g/dL (ref 3.5–5.0)
ALK PHOS: 81 U/L (ref 38–126)
ALT: 16 U/L (ref 14–54)
ANION GAP: 7 (ref 5–15)
AST: 23 U/L (ref 15–41)
BILIRUBIN TOTAL: 0.5 mg/dL (ref 0.3–1.2)
BUN: 14 mg/dL (ref 6–20)
CALCIUM: 9.4 mg/dL (ref 8.9–10.3)
CO2: 26 mmol/L (ref 22–32)
Chloride: 104 mmol/L (ref 101–111)
Creatinine, Ser: 0.9 mg/dL (ref 0.44–1.00)
GFR calc Af Amer: >60 mL/min (ref >60)
GLUCOSE: 107 mg/dL — AB (ref 65–99)
POTASSIUM: 4.3 mmol/L (ref 3.5–5.1)
Sodium: 137 mmol/L (ref 135–145)
TOTAL PROTEIN: 8.1 g/dL (ref 6.5–8.1)

## 2016-06-19 LAB — I-STAT CHEM 8, ED
BUN: 18 mg/dL (ref 6–20)
CALCIUM ION: 1.13 mmol/L (ref 1.13–1.30)
CREATININE: 0.8 mg/dL (ref 0.44–1.00)
Chloride: 100 mmol/L — ABNORMAL LOW (ref 101–111)
Glucose, Bld: 102 mg/dL — ABNORMAL HIGH (ref 65–99)
HEMATOCRIT: 42 % (ref 36.0–46.0)
HEMOGLOBIN: 14.3 g/dL (ref 12.0–15.0)
Potassium: 4.3 mmol/L (ref 3.5–5.1)
SODIUM: 140 mmol/L (ref 135–145)
TCO2: 29 mmol/L (ref 0–100)

## 2016-06-19 LAB — URINALYSIS, ROUTINE W REFLEX MICROSCOPIC
BILIRUBIN URINE: NEGATIVE
GLUCOSE, UA: NEGATIVE mg/dL
KETONES UR: NEGATIVE mg/dL
Leukocytes, UA: NEGATIVE
NITRITE: NEGATIVE
PH: 5.5 (ref 5.0–8.0)
PROTEIN: NEGATIVE mg/dL
Specific Gravity, Urine: 1.012 (ref 1.005–1.030)

## 2016-06-19 LAB — PROTIME-INR
INR: 1.02 (ref 0.00–1.49)
Prothrombin Time: 13.6 seconds (ref 11.6–15.2)

## 2016-06-19 LAB — URINE MICROSCOPIC-ADD ON

## 2016-06-19 LAB — TROPONIN I: Troponin I: <0.03 ng/mL (ref <0.03)

## 2016-06-19 LAB — APTT: APTT: 30 s (ref 24–37)

## 2016-06-19 MED ORDER — CLORAZEPATE DIPOTASSIUM 3.75 MG PO TABS
7.5000 mg | ORAL_TABLET | Freq: Every day | ORAL | Status: DC
Start: 2016-06-19 — End: 2016-06-20

## 2016-06-19 MED ORDER — GI COCKTAIL ~~LOC~~
30.0000 mL | Freq: Two times a day (BID) | ORAL | Status: DC | PRN
  Filled 2016-06-19: qty 30

## 2016-06-19 MED ORDER — ASPIRIN 81 MG PO CHEW
324.0000 mg | CHEWABLE_TABLET | Freq: Once | ORAL | Status: AC
  Administered 2016-06-19: 324 mg via ORAL
  Filled 2016-06-19: qty 4

## 2016-06-19 MED ORDER — MECLIZINE HCL 12.5 MG PO TABS
25.0000 mg | ORAL_TABLET | Freq: Three times a day (TID) | ORAL | Status: DC | PRN
Start: 2016-06-19 — End: 2016-06-20

## 2016-06-19 MED ORDER — DOCUSATE SODIUM 100 MG PO CAPS
100.0000 mg | ORAL_CAPSULE | Freq: Every day | ORAL | Status: DC
  Administered 2016-06-20: 100 mg via ORAL
  Filled 2016-06-19: qty 1

## 2016-06-19 MED ORDER — DOCUSATE SODIUM 100 MG PO CAPS: 100.0000 mg | ORAL_CAPSULE | Freq: Every day | ORAL | Status: DC

## 2016-06-19 MED ORDER — ESTROGENS CONJUGATED 1.25 MG PO TABS
1.2500 mg | ORAL_TABLET | Freq: Every day | ORAL | Status: DC
  Administered 2016-06-20: 1.25 mg via ORAL
  Filled 2016-06-19: qty 1

## 2016-06-19 MED ORDER — FLUOXETINE HCL 20 MG PO CAPS: 40.0000 mg | ORAL_CAPSULE | Freq: Every day | ORAL | Status: DC

## 2016-06-19 MED ORDER — TRAZODONE HCL 50 MG PO TABS: 75.0000 mg | ORAL_TABLET | Freq: Every evening | ORAL | Status: DC | PRN

## 2016-06-19 MED ORDER — ALPRAZOLAM 0.25 MG PO TABS: 0.2500 mg | ORAL_TABLET | Freq: Three times a day (TID) | ORAL | Status: DC | PRN

## 2016-06-19 MED ORDER — PANTOPRAZOLE SODIUM 20 MG PO TBEC: 20.0000 mg | DELAYED_RELEASE_TABLET | Freq: Two times a day (BID) | ORAL | Status: DC

## 2016-06-19 MED ORDER — SENNOSIDES-DOCUSATE SODIUM 8.6-50 MG PO TABS: 1.0000 | ORAL_TABLET | Freq: Every evening | ORAL | Status: DC | PRN

## 2016-06-19 MED ORDER — FLUOXETINE HCL 20 MG PO CAPS
40.0000 mg | ORAL_CAPSULE | Freq: Every day | ORAL | Status: DC
  Administered 2016-06-20: 40 mg via ORAL
  Filled 2016-06-19: qty 2

## 2016-06-19 MED ORDER — STROKE: EARLY STAGES OF RECOVERY BOOK
Freq: Once | Status: DC
  Filled 2016-06-19: qty 1

## 2016-06-19 MED ORDER — IOPAMIDOL (ISOVUE-370) INJECTION 76%
INTRAVENOUS | Status: AC
  Administered 2016-06-19: 50 mL
  Filled 2016-06-19: qty 50

## 2016-06-19 MED ORDER — SODIUM CHLORIDE 0.9 % IV SOLN
INTRAVENOUS | Status: AC
  Administered 2016-06-19: 20:00:00 via INTRAVENOUS

## 2016-06-19 MED ORDER — FLUTICASONE PROPIONATE 50 MCG/ACT NA SUSP: 2.0000 | Freq: Every day | NASAL | Status: DC

## 2016-06-19 NOTE — ED Notes (Signed)
Pt sts right sided weakness episode yesterday that is resolved but still having some numbness to right side of face; no obvious neuro deficits noted; pt sts "funny feeling in head"

## 2016-06-19 NOTE — ED Notes (Signed)
Pt's friend going up to pt's room on [facility] to await her return from MRI. This friend took pt's shoes, pants, shirt.

## 2016-06-19 NOTE — ED Notes (Signed)
Pt returned from MRI, will transport to floor. 5C made aware. Pt is a x 4 continues to have diminished right sided sensory deficits. No other detected.

## 2016-06-19 NOTE — ED Notes (Signed)
Pt has gone to MRI. Floor called to make aware pt has gone to MRI then will come back to ED to be transported to floor. Secretary on [facility] made aware.

## 2016-06-19 NOTE — ED Provider Notes (Signed)
CSN: [id number]     Arrival date & time 06/19/16  1156 History   First MD Initiated Contact with Patient 06/19/16 1223     Chief Complaint  Patient presents with  . Weakness     (Consider location/radiation/quality/duration/timing/severity/associated sxs/prior Treatment) Patient is a 60 y.o. female presenting with neurologic complaint. The history is provided by the patient.  Neurologic Problem This is a new problem. The current episode started yesterday. The problem has been resolved. Associated symptoms include chest pain, neck pain, numbness and weakness. Pertinent negatives include no abdominal pain, chills, congestion, coughing, fever, nausea, rash, sore throat or vomiting. Nothing aggravates the symptoms. She has tried nothing for the symptoms.    Past Medical History  Diagnosis Date  . Clotting disorder ([place])   . Lipoma 03-04-12    Rt. back about scapular ares  . 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  '95- blow normal levels, but stable.  . Fibromyalgia 03-04-12    Sporadic pain  . Depression 03-04-12    tx. meds  . PONV (postoperative nausea and vomiting)   . Allergic state 04/17/2013  . Depression with anxiety 04/17/2013  . Preventative health care 04/17/2013  . GERD (gastroesophageal reflux disease) 08/17/2013  . Solitary pulmonary nodule 08/17/2013    LLL seen on CT in March 2014  . Preventative health care 08/17/2013  . Advanced care planning/counseling discussion 09/01/2014  . Overweight 03/06/2015  . Hyperlipidemia, mixed 10/16/2015  . ETOH abuse    Past Surgical History  Procedure Laterality Date  . Splenectomy, total  1995  . Appendectomy  1995  . Irrigation and debridement abscess  03-04-12    x2- buttocks  . Parotid gland tumor excision Left     benign  . Breast surgery  03-04-12    12'12 -left needle biopsy(benign)-Titanium needle marker remains  . Mass excision  03/10/2012    Procedure: EXCISION MASS;  Surgeon: Odis Hollingshead, MD;  Location: [facility]  [facility];  Service: General;  Laterality: N/A;  Removal of back lipoma  . Abdominal hysterectomy  1995    total, endometriosis, fibroid, ovarian atrophy  . Colonoscopy  2008    WNL, Dr Cristina Gong  . Cholecystectomy N/A 04/24/2013    Procedure: LAPAROSCOPIC CHOLECYSTECTOMY WITH INTRAOPERATIVE CHOLANGIOGRAM;  Surgeon: Haywood Lasso, MD;  Location: [facility];  Service: General;  Laterality: N/A;   Family History  Problem Relation Age of Onset  . Heart disease Maternal Grandmother   . Heart disease Maternal Grandfather   . Heart attack Maternal Grandfather 62  . Heart disease Paternal Grandmother   . Heart attack Paternal Grandmother 86  . Heart disease Paternal Grandfather   . Cancer Paternal Grandfather     chest- smoker  . Diabetes Mother     type 2- controlled by diet  . Atrial fibrillation Father   . Hyperlipidemia Sister   . Depression Sister    Social History  Substance Use Topics  . Smoking status: Former Smoker -- 5 years    Quit date: 02/21/1983  . Smokeless tobacco: Never Used  . Alcohol Use: 0.0 oz/week    3-4 Cans of beer per week     Comment: weekends -beer   OB History    No data available     Review of Systems  Constitutional: Negative for fever and chills.  HENT: Negative for congestion and sore throat.   Eyes: Negative for pain.  Respiratory: Negative for cough and shortness of breath.   Cardiovascular: Positive for  chest pain. Negative for palpitations and leg swelling.  Gastrointestinal: Negative for nausea, vomiting, abdominal pain and diarrhea.  Genitourinary: Negative for dysuria and flank pain.  Musculoskeletal: Positive for neck pain. Negative for back pain.  Skin: Negative for rash.  Allergic/Immunologic: Negative.   Neurological: Positive for weakness and numbness. Negative for dizziness and light-headedness.  Psychiatric/Behavioral: Negative for confusion.      Allergies  Codeine  Home Medications   Prior to Admission medications    Medication Sig Start Date End Date Taking? Authorizing Provider  acetaminophen (TYLENOL) 500 MG tablet Take 1,000 mg by mouth every 6 (six) hours as needed for pain.    Historical Provider, MD  ALPRAZolam Duanne Moron) 0.25 MG tablet TAKE 1 TABLET BY MOUTH TWICE DAILY AS NEEDED FOR SLEEP OR ANXIETY 03/08/16   [name], MD  Calcium Carb-Cholecalciferol (CALCIUM 600 + D PO) Take 1 tablet by mouth daily.    Historical Provider, MD  Cholecalciferol (VITAMIN D) 2000 units CAPS Take 2,000 Units by mouth daily.    Historical Provider, MD  clorazepate (TRANXENE) 7.5 MG tablet Take 1 tablet (7.5 mg total) by mouth at bedtime. 03/29/16   [name], MD  docusate sodium (COLACE) 100 MG capsule Take 100 mg by mouth daily.    Historical Provider, MD  FLUoxetine (PROZAC) 20 MG capsule TAKE 2 CAPSULES BY MOUTH DAILY 05/24/16   [name], MD  fluticasone (FLONASE) 50 MCG/ACT nasal spray Place 2 sprays into both nostrils daily. 02/10/14   Brunetta Jeans, PA-C  meclizine (ANTIVERT) 25 MG tablet TAKE 1 TABLET (25 MG) BY MOUTH 3 TIMES A DAY AS NEEDED FOR DIZZINESS OR NAUSEA. 05/23/15   [name], MD  Multiple Vitamins-Minerals (HAIR/SKIN/NAILS PO) Take 3 tablets by mouth daily.    Historical Provider, MD  ondansetron (ZOFRAN) 4 MG tablet Take 1 tablet (4 mg total) by mouth every 6 (six) hours. 07/05/15   Montine Circle, PA-C  pantoprazole (PROTONIX) 20 MG tablet Take 1 tablet (20 mg total) by mouth 2 (two) times daily. 02/13/16   Nat Christen, MD  PREMARIN 1.25 MG tablet TAKE 1 TABLET BY MOUTH DAILY. 05/24/16   [name], MD  ranitidine (ZANTAC) 150 MG tablet Take 150 mg by mouth at bedtime.    Historical Provider, MD  traZODone (DESYREL) 50 MG tablet TAKE 1 AND 1/2 TABLETS (75 MG TOTAL) BY MOUTH AT BEDTIME. 12/06/15   [name], MD   BP 149/66 mmHg  Pulse 61  Temp(Src) 97.7 F (36.5 C) (Oral)  Resp 16  Ht 5\' 8"  (1.727 m)  Wt 81.647 kg  BMI 27.38 kg/m2  SpO2 100% Physical Exam  Constitutional:  She is oriented to person, place, and time. She appears well-developed and well-nourished. No distress.  HENT:  Head: Normocephalic and atraumatic.  Eyes: Conjunctivae and EOM are normal. Pupils are equal, round, and reactive to light.  Neck: Normal range of motion. Neck supple.  Cardiovascular: Normal rate, regular rhythm and normal heart sounds.   Pulses:      Radial pulses are 2+ on the right side, and 2+ on the left side.  Pulmonary/Chest: Effort normal and breath sounds normal. No respiratory distress.  Abdominal: Soft. Bowel sounds are normal. There is no tenderness.  Musculoskeletal: Normal range of motion.  Neurological: She is alert and oriented to person, place, and time. She has normal strength and normal reflexes. No cranial nerve deficit or sensory deficit. She displays a negative Romberg sign. GCS eye subscore  is 4. GCS verbal subscore is 5. GCS motor subscore is 6.  Normal finger to nose bilaterally.   No pronator drift bilaterally.    Skin: Skin is warm and dry. She is not diaphoretic.  Psychiatric: She has a normal mood and affect.    ED Course  Procedures (including critical care time) Labs Review Labs Reviewed  CBC - Abnormal; Notable for the following:    Platelets 47 (*)    All other components within normal limits  COMPREHENSIVE METABOLIC PANEL - Abnormal; Notable for the following:    Glucose, Bld 107 (*)    All other components within normal limits  URINALYSIS, ROUTINE W REFLEX MICROSCOPIC (NOT AT [facility]) - Abnormal; Notable for the following:    Hgb urine dipstick TRACE (*)    All other components within normal limits  URINE MICROSCOPIC-ADD ON - Abnormal; Notable for the following:    Squamous Epithelial / LPF 0-5 (*)    Bacteria, UA FEW (*)    All other components within normal limits  I-STAT CHEM 8, ED - Abnormal; Notable for the following:    Chloride 100 (*)    Glucose, Bld 102 (*)    All other components within normal limits  PROTIME-INR  APTT   DIFFERENTIAL  TROPONIN I  HEMOGLOBIN A1C  LIPID PANEL  I-STAT TROPOININ, ED    Imaging Review Ct Angio Head W/cm &/or Wo Cm  06/19/2016  CLINICAL DATA:  60 year old female with episode of dizziness yesterday, TIAs possibly from posterior circulation disease. Initial encounter. Personal history of left parotid mass excision 15 years ago, reportedly benign. EXAM: CT ANGIOGRAPHY HEAD AND NECK TECHNIQUE: Multidetector CT imaging of the head and neck was performed using the standard protocol during bolus administration of intravenous contrast. Multiplanar CT image reconstructions and MIPs were obtained to evaluate the vascular anatomy. Carotid stenosis measurements (when applicable) are obtained utilizing NASCET criteria, using the distal internal carotid diameter as the denominator. CONTRAST:  50 mL Isovue 370 COMPARISON:  Head CT without contrast 1230 hours today. Neck CT 02/13/2016. FINDINGS: CTA NECK Skeleton:  No acute osseous abnormality identified. Visualized paranasal sinuses and mastoids are stable and well pneumatized. Other neck: Negative lung apices. No superior mediastinal lymphadenopathy. Negative thyroid, larynx, pharynx, parapharyngeal spaces, retropharyngeal space, sublingual space, submandibular glands, and right parotid gland. There is continued abnormal soft tissue nodularity along the inferior left parotid gland. This has a nodular reversed C-shaped configuration (series 9, image 159) and appears stable since March measuring up to 15 mm in thickness. This is inseparable from an overlying skin incision. Regional left level 2 lymph nodes are stable and within normal limits. No cervical lymphadenopathy or new soft tissue mass. Aortic arch: Borderline bovine type arch configuration. No arch atherosclerosis or great vessel origin stenosis. Right carotid system: Negative. Left carotid system: Negative. Vertebral arteries:No proximal subclavian stenosis. Both vertebral artery origins are normal  (the left on series 8, image 122 an the right on image 116). Codominant vertebral arteries are patent and appear normal to the skullbase. CTA HEAD Posterior circulation: Normal distal vertebral arteries. Both PICA origins appear normal. Normal vertebrobasilar junction. No distal vertebral artery or basilar artery stenosis. Normal SCA origins. The PCA origins are somewhat shared from the basilar tip (series 11, image 22). Posterior communicating arteries are diminutive or absent. Bilateral PCA branches are within normal limits. Anterior circulation: Both ICA siphons are patent without stenosis. There is minimal calcified plaque on the right. No siphon aneurysm. Patent carotid termini. Dominant left  A1 segment, right A1 is diminutive. Anterior communicating artery and bilateral ACA branches are within normal limits. Left MCA M1 segment, bifurcation, and left MCA branches are normal. Right MCA M1 segment, trifurcation and right MCA branches are normal. Venous sinuses: Patent. Anatomic variants: Dominant left ACA A1 segment. Delayed phase: Stable and normal gray-white matter differentiation. No cortically based acute infarct identified. No abnormal enhancement identified. IMPRESSION: 1. Negative CTA head and neck. No atherosclerosis or stenosis in the neck. Minimal right ICA siphon calcified plaque. No intracranial aneurysm or stenosis identified. 2. Continued abnormal soft tissue nodularity along the posterior and inferior left parotid space and inseparable from an overlying skin incision. 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 3. Stable and negative CT appearance of the brain. Electronically Signed   By: Genevie Ann M.D.   On: 06/19/2016 14:51   Dg Chest 2 View  06/19/2016  CLINICAL DATA:  Right-sided weakness. EXAM: CHEST  2 VIEW COMPARISON:  04/22/2013. FINDINGS: The  lungs are clear wiithout focal pneumonia, edema, pneumothorax or pleural effusion. Nodular density/densities projecting over the lungs are compatible with pads for telemetry leads. The cardiopericardial silhouette is within normal limits for size. The visualized bony structures of the thorax are intact. IMPRESSION: No active cardiopulmonary disease. Electronically Signed   By: Misty Stanley M.D.   On: 06/19/2016 14:52   Ct Head Wo Contrast  06/19/2016  CLINICAL DATA:  Dizziness and fall.  Difficulty moving right arm. EXAM: CT HEAD WITHOUT CONTRAST TECHNIQUE: Contiguous axial images were obtained from the base of the skull through the vertex without intravenous contrast. COMPARISON:  02/10/2016 FINDINGS: Ventricles, cisterns and other CSF spaces are within normal. There is no mass, mass effect, shift of midline structures or acute hemorrhage. No evidence of acute infarction. Remaining bones and soft tissues are within normal. IMPRESSION: No acute intracranial findings. Electronically Signed   By: Marin Olp M.D.   On: 06/19/2016 12:52   Ct Angio Neck W Or Wo Contrast  06/19/2016  CLINICAL DATA:  60 year old female with episode of dizziness yesterday, TIAs possibly from posterior circulation disease. Initial encounter. Personal history of left parotid mass excision 15 years ago, reportedly benign. EXAM: CT ANGIOGRAPHY HEAD AND NECK TECHNIQUE: Multidetector CT imaging of the head and neck was performed using the standard protocol during bolus administration of intravenous contrast. Multiplanar CT image reconstructions and MIPs were obtained to evaluate the vascular anatomy. Carotid stenosis measurements (when applicable) are obtained utilizing NASCET criteria, using the distal internal carotid diameter as the denominator. CONTRAST:  50 mL Isovue 370 COMPARISON:  Head CT without contrast 1230 hours today. Neck CT 02/13/2016. FINDINGS: CTA NECK Skeleton:  No acute osseous abnormality identified. Visualized  paranasal sinuses and mastoids are stable and well pneumatized. Other neck: Negative lung apices. No superior mediastinal lymphadenopathy. Negative thyroid, larynx, pharynx, parapharyngeal spaces, retropharyngeal space, sublingual space, submandibular glands, and right parotid gland. There is continued abnormal soft tissue nodularity along the inferior left parotid gland. This has a nodular reversed C-shaped configuration (series 9, image 159) and appears stable since March measuring up to 15 mm in thickness. This is inseparable from an overlying skin incision. Regional left level 2 lymph nodes are stable and within normal limits. No cervical lymphadenopathy or new soft tissue mass. Aortic arch: Borderline bovine type arch configuration. No arch atherosclerosis or great vessel origin stenosis. Right carotid  system: Negative. Left carotid system: Negative. Vertebral arteries:No proximal subclavian stenosis. Both vertebral artery origins are normal (the left on series 8, image 122 an the right on image 116). Codominant vertebral arteries are patent and appear normal to the skullbase. CTA HEAD Posterior circulation: Normal distal vertebral arteries. Both PICA origins appear normal. Normal vertebrobasilar junction. No distal vertebral artery or basilar artery stenosis. Normal SCA origins. The PCA origins are somewhat shared from the basilar tip (series 11, image 22). Posterior communicating arteries are diminutive or absent. Bilateral PCA branches are within normal limits. Anterior circulation: Both ICA siphons are patent without stenosis. There is minimal calcified plaque on the right. No siphon aneurysm. Patent carotid termini. Dominant left A1 segment, right A1 is diminutive. Anterior communicating artery and bilateral ACA branches are within normal limits. Left MCA M1 segment, bifurcation, and left MCA branches are normal. Right MCA M1 segment, trifurcation and right MCA branches are normal. Venous sinuses: Patent.  Anatomic variants: Dominant left ACA A1 segment. Delayed phase: Stable and normal gray-white matter differentiation. No cortically based acute infarct identified. No abnormal enhancement identified. IMPRESSION: 1. Negative CTA head and neck. No atherosclerosis or stenosis in the neck. Minimal right ICA siphon calcified plaque. No intracranial aneurysm or stenosis identified. 2. Continued abnormal soft tissue nodularity along the posterior and inferior left parotid space and inseparable from an overlying skin incision. 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 3. Stable and negative CT appearance of the brain. Electronically Signed   By: Genevie Ann M.D.   On: 06/19/2016 14:51   Mr Brain Wo Contrast  06/19/2016  CLINICAL DATA:  60 year old female with episode of dizziness yesterday, TIAs possibly from posterior circulation disease. Initial encounter. EXAM: MRI HEAD WITHOUT CONTRAST TECHNIQUE: Multiplanar, multiecho pulse sequences of the brain and surrounding structures were obtained without intravenous contrast. COMPARISON:  CTA head and neck 1417 hours today reported separately. FINDINGS: Cerebral volume is normal. No restricted diffusion to suggest acute infarction. No midline shift, mass effect, evidence of mass lesion, ventriculomegaly, extra-axial collection or acute intracranial hemorrhage. Cervicomedullary junction and pituitary are within normal limits. Major intracranial vascular flow voids appear normal. Pearline Cables and white matter signal is within normal limits for age throughout the brain. No cortical encephalomalacia or chronic cerebral blood products identified. Visible internal auditory structures appear normal. Mastoids are clear. Negative visualized cervical spine. Normal bone marrow signal. Negative orbit and scalp soft tissues. Paranasal sinuses are clear.  Left inferior parotid space nodular soft tissue re- demonstrated (series 2, image 21 and series 10, image 12). IMPRESSION: 1. Normal for age noncontrast MRI appearance of the brain. 2. Left inferior parotid space nodular soft tissue re- demonstrated, see also neck CTA from today. Electronically Signed   By: Genevie Ann M.D.   On: 06/19/2016 16:07   I have personally reviewed and evaluated these images and lab results as part of my medical decision-making.   EKG Interpretation None      MDM   Final diagnoses:  Transient cerebral ischemia, unspecified transient cerebral ischemia type    The patient is a 60 year old female with a history of hyperlipidemia presenting today for right-sided weakness and numbness. She reports this occurred yesterday while she was at work with clumsiness of her right hand numbness of her right face and weakness of her right leg. She reports attempting to stand up and falling  to her right side. Admits to posterior neck pain and with extension of her neck dizziness as well. Patient also reports intermittent epigastric and substernal chest pressure not aggravated by exertion with no radiation or concurrent nausea or vomiting.  On evaluation patient reports his symptoms have completely resolved. Story concerning for TIA however. CT head performed showing no acute intracranial abnormalities. Chest x-ray with no acute abnormalities. EKG with no ST or T wave changes with normal intervals and rate. Troponin negative. Moderate wrist first chest pain. Recommend cycling troponins as an inpatient and possible stress testing. Low suspicion for dissection.   Discussed findings with neurology who recommended admission. Also recommend CTA for possible dissection and MRI. These were performed in the emergency department but to be followed up by the inpatient team.  Labs were viewed by myself and incorporated into medical decision making.  Discussed pertinent finding with patient or  caregiver prior to admission with no further questions.  Pt care supervised by my attending Dr. Reather Converse.   Geronimo Boot, MD PGY-3 Emergency Medicine     Geronimo Boot, MD 06/19/16 [id number]  Elnora Morrison, MD 06/23/16 [phone]

## 2016-06-19 NOTE — Telephone Encounter (Signed)
°  Relationship to patient: Self  Can be reached: [phone]  Reason for call: Patient states she had some soreness in her neck for about 4 weeks and yesterday was dizzy for about 20 minutes and her right arm was weak. Request call back to discuss what she needs to do.

## 2016-06-19 NOTE — ED Notes (Signed)
Spoke with Laura Bender in [place], when patient has completed CT scan transport will return patient to Pod D30

## 2016-06-19 NOTE — Telephone Encounter (Signed)
Spoke with pt. She states she has had soreness behind her ears at base of skull for 4 weeks. Notices discomfort when she turns head or lies down.  Reports episode yesterday that began as seeing a flash of light, had right sided weakness. Could not move her computer mouse with right arm and fell into the wall when she tried to walk. Lasted 20 minutes. Felt like she was in a fog for a couple of hours after that. States weakness is better today but still feels like she is in a fog and has right side facial numbness as well as pressure in her head. States smile is symmetric and denies facial / eye drooping. Advised pt she needs ER evaluation of symptoms and pt voices understanding. Will have someone drive her to the ER or call 911 if needed.

## 2016-06-19 NOTE — H&P (Signed)
History and Physical    Laura Bender [id number] DOB: 07/11/1956 DOA: 06/19/2016  PCP: Penni Homans, MD Patient coming from: home  Chief Complaint: right face tingling/right hand tingling/chest pain  HPI: Laura Bender is a 60 y.o. female with medical history significant for ITP, fibromyalgia, depression anxiety, GERD, hyperlipidemia presents to emergency department with chief complaint right-sided weakness with some tingling of the right side of her face and her right hand and intermittent chest pain. Initial evaluation concerning for TIA.   Information is obtained from the patient and the chart. She reports noticing some right-sided facial tingling yesterday as well as right leg did "did not feel right". She denies a heaviness and numbness or tingling or weakness. In addition she reports yesterday episode of visual disturbances in the form of Laura Bender spot in her vision as well as bright light yesterday while sitting at her desk. In addition she noted that she would try to place her hand on her computer mouse and would miss. Associated symptoms included nausea without vomiting, diaphoresis unsteady gait generalized weakness. She reports some dizziness. She said this episode lasted about 45 minutes and just resolved itself. She said she felt okay the rest of the day ate a normal dinner and slept through the night. Upon awakening this morning she "did not feel right". She called her PCP who recommended she go to the emergency department. She also reports intermittent chest pain located substernal. Describes it as sharp lasting 1-2 minutes nonradiating. She denies palpitation shortness of breath headache dizziness. She reports these episodes weekly. She denies dysuria hematuria frequency or urgency. She denies abdominal pain diarrhea constipation melena bright red blood per rectum      ED Course: In the emergency department she's afebrile hemodynamically stable with a normal neuro exam    Review of Systems: As per HPI otherwise 10 point review of systems negative.   Ambulatory Status: Ambulates independently with steady gait   Past Medical History  Diagnosis Date  . Clotting disorder ([place])   . Lipoma 03-04-12    Rt. back about scapular ares  . 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  '95- blow normal levels, but stable.  . Fibromyalgia 03-04-12    Sporadic pain  . Depression 03-04-12    tx. meds  . PONV (postoperative nausea and vomiting)   . Allergic state 04/17/2013  . Depression with anxiety 04/17/2013  . Preventative health care 04/17/2013  . GERD (gastroesophageal reflux disease) 08/17/2013  . Solitary pulmonary nodule 08/17/2013    LLL seen on CT in March 2014  . Preventative health care 08/17/2013  . Advanced care planning/counseling discussion 09/01/2014  . Overweight 03/06/2015  . Hyperlipidemia, mixed 10/16/2015    Past Surgical History  Procedure Laterality Date  . Splenectomy, total  1995  . Appendectomy  1995  . Irrigation and debridement abscess  03-04-12    x2- buttocks  . Parotid gland tumor excision Left     benign  . Breast surgery  03-04-12    12'12 -left needle biopsy(benign)-Titanium needle marker remains  . Mass excision  03/10/2012    Procedure: EXCISION MASS;  Surgeon: Odis Hollingshead, MD;  Location: [facility];  Service: General;  Laterality: N/A;  Removal of back lipoma  . Abdominal hysterectomy  1995    total, endometriosis, fibroid, ovarian atrophy  . Colonoscopy  2008    WNL, Dr Cristina Gong  . Cholecystectomy N/A 04/24/2013    Procedure: LAPAROSCOPIC CHOLECYSTECTOMY WITH INTRAOPERATIVE CHOLANGIOGRAM;  Surgeon:  Haywood Lasso, MD;  Location: [facility];  Service: General;  Laterality: N/A;    Social History   Social History  . Marital Status: Single    Spouse Name: N/A  . Number of Children: N/A  . Years of Education: N/A   Occupational History  . Not on file.   Social History Main Topics  . Smoking status: Former Smoker  -- 5 years    Quit date: 02/21/1983  . Smokeless tobacco: Never Used  . Alcohol Use: 0.0 oz/week    3-4 Cans of beer per week     Comment: weekends -beer  . Drug Use: No  . Sexual Activity: No     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 lives with partner    Other Topics Concern  . Not on file   Social History Narrative   Works at [employer]    Allergies  Allergen Reactions  . Codeine Nausea And Vomiting    Family History  Problem Relation Age of Onset  . Heart disease Maternal Grandmother   . Heart disease Maternal Grandfather   . Heart attack Maternal Grandfather 62  . Heart disease Paternal Grandmother   . Heart attack Paternal Grandmother 86  . Heart disease Paternal Grandfather   . Cancer Paternal Grandfather     chest- smoker  . Diabetes Mother     type 2- controlled by diet  . Atrial fibrillation Father   . Hyperlipidemia Sister   . Depression Sister     Prior to Admission medications   Medication Sig Start Date End Date Taking? Authorizing Provider  acetaminophen (TYLENOL) 500 MG tablet Take 1,000 mg by mouth every 6 (six) hours as needed for pain.    Historical Provider, MD  ALPRAZolam Duanne Moron) 0.25 MG tablet TAKE 1 TABLET BY MOUTH TWICE DAILY AS NEEDED FOR SLEEP OR ANXIETY 03/08/16   [name], MD  Calcium Carb-Cholecalciferol (CALCIUM 600 + D PO) Take 1 tablet by mouth daily.    Historical Provider, MD  Cholecalciferol (VITAMIN D) 2000 units CAPS Take 2,000 Units by mouth daily.    Historical Provider, MD  clorazepate (TRANXENE) 7.5 MG tablet Take 1 tablet (7.5 mg total) by mouth at bedtime. 03/29/16   [name], MD  docusate sodium (COLACE) 100 MG capsule Take 100 mg by mouth daily.    Historical Provider, MD  FLUoxetine (PROZAC) 20 MG capsule TAKE 2 CAPSULES BY MOUTH DAILY 05/24/16   [name], MD  fluticasone (FLONASE) 50 MCG/ACT nasal spray Place 2 sprays into both nostrils daily. 02/10/14    Brunetta Jeans, PA-C  meclizine (ANTIVERT) 25 MG tablet TAKE 1 TABLET (25 MG) BY MOUTH 3 TIMES A DAY AS NEEDED FOR DIZZINESS OR NAUSEA. 05/23/15   [name], MD  Multiple Vitamins-Minerals (HAIR/SKIN/NAILS PO) Take 3 tablets by mouth daily.    Historical Provider, MD  ondansetron (ZOFRAN) 4 MG tablet Take 1 tablet (4 mg total) by mouth every 6 (six) hours. 07/05/15   Montine Circle, PA-C  pantoprazole (PROTONIX) 20 MG tablet Take 1 tablet (20 mg total) by mouth 2 (two) times daily. 02/13/16   Nat Christen, MD  PREMARIN 1.25 MG tablet TAKE 1 TABLET BY MOUTH DAILY. 05/24/16   [name], MD  ranitidine (ZANTAC) 150 MG tablet Take 150 mg by mouth at bedtime.    Historical Provider, MD  traZODone (DESYREL) 50 MG tablet TAKE 1 AND 1/2 TABLETS (75 MG TOTAL)  BY MOUTH AT BEDTIME. 12/06/15   [name], MD    Physical Exam: Filed Vitals:   06/19/16 1203 06/19/16 1300 06/19/16 1315 06/19/16 1345  BP: 154/83 134/78 105/87 122/72  Pulse: 70 79 64 70  Temp: 97.8 F (36.6 C)     TempSrc: Oral     Resp: 18 18 16 19   Height: 5\' 8"  (1.727 m)     Weight: 81.647 kg (180 lb)     SpO2: 100% 99% 98% 98%     General:  Appears calm and comfortable Eyes:  PERRL, EOMI, normal lids, iris ENT:  grossly normal hearing, lips & tongue, Is membranes of her mouth are moist and pink  Neck:  no LAD, masses or thyromegaly Cardiovascular:  RRR, no m/r/g. No LE edema.  Respiratory:  CTA bilaterally, no w/r/r. Normal respiratory effort. Abdomen:  soft, positive bowel sounds throughout nontender nondistended no guarding  Skin:  no rash or induration seen on limited exam Musculoskeletal:  grossly normal tone BUE/BLE, good ROM, no bony abnormality Psychiatric:  grossly normal mood and affect, speech fluent and appropriate, AOx3 Neurologic:  CN 2-12 grossly intact, moves all extremities in coordinated fashion, sensation intact. Speech clear facial symmetry bilateral grip 5 out of 5 lower extremity strength 5 out of 5  sensation intact bilaterally all extremities no pronator drift   Labs on Admission: I have personally reviewed following labs and imaging studies  CBC:  Recent Labs Lab 06/19/16 1208 06/19/16 1230  WBC 6.3  --   NEUTROABS 3.1  --   HGB 12.6 14.3  HCT 38.9 42.0  MCV 94.2  --   PLT 47*  --    Basic Metabolic Panel:  Recent Labs Lab 06/19/16 1208 06/19/16 1230  NA 137 140  K 4.3 4.3  CL 104 100*  CO2 26  --   GLUCOSE 107* 102*  BUN 14 18  CREATININE 0.90 0.80  CALCIUM 9.4  --    GFR: Estimated Creatinine Clearance: 84.9 mL/min (by C-G formula based on Cr of 0.8). Liver Function Tests:  Recent Labs Lab 06/19/16 1208  AST 23  ALT 16  ALKPHOS 81  BILITOT 0.5  PROT 8.1  ALBUMIN 4.0   No results for input(s): LIPASE, AMYLASE in the last 168 hours. No results for input(s): AMMONIA in the last 168 hours. Coagulation Profile:  Recent Labs Lab 06/19/16 1208  INR 1.02   Cardiac Enzymes: No results for input(s): CKTOTAL, CKMB, CKMBINDEX, TROPONINI in the last 168 hours. BNP (last 3 results) No results for input(s): PROBNP in the last 8760 hours. HbA1C: No results for input(s): HGBA1C in the last 72 hours. CBG: No results for input(s): GLUCAP in the last 168 hours. Lipid Profile: No results for input(s): CHOL, HDL, LDLCALC, TRIG, CHOLHDL, LDLDIRECT in the last 72 hours. Thyroid Function Tests: No results for input(s): TSH, T4TOTAL, FREET4, T3FREE, THYROIDAB in the last 72 hours. Anemia Panel: No results for input(s): VITAMINB12, FOLATE, FERRITIN, TIBC, IRON, RETICCTPCT in the last 72 hours. Urine analysis:    Component Value Date/Time   COLORURINE YELLOW 06/19/2016 [place] 06/19/2016 1257   LABSPEC 1.012 06/19/2016 1257   LABSPEC 1.010 02/04/2009 0857   LABSPEC 1.010 04/26/2006 1102   PHURINE 5.5 06/19/2016 1257   PHURINE 5.0 04/26/2006 1102   GLUCOSEU NEGATIVE 06/19/2016 1257   HGBUR TRACE* 06/19/2016 1257   HGBUR Negative  04/26/2006 [place] 06/19/2016 1257   BILIRUBINUR Negative 04/26/2006 1102   KETONESUR  NEGATIVE 06/19/2016 1257   KETONESUR Negative 04/26/2006 1102   PROTEINUR NEGATIVE 06/19/2016 1257   PROTEINUR Negative 04/26/2006 1102   UROBILINOGEN 0.2 02/20/2013 1601   NITRITE NEGATIVE 06/19/2016 1257   NITRITE Negative 04/26/2006 1102   LEUKOCYTESUR NEGATIVE 06/19/2016 1257   LEUKOCYTESUR Trace 04/26/2006 1102    Creatinine Clearance: Estimated Creatinine Clearance: 84.9 mL/min (by C-G formula based on Cr of 0.8).  Sepsis Labs: @LABRCNTIP (procalcitonin:4,lacticidven:4) )No results found for this or any previous visit (from the past 240 hour(s)).   Radiological Exams on Admission: Ct Head Wo Contrast  06/19/2016  CLINICAL DATA:  Dizziness and fall.  Difficulty moving right arm. EXAM: CT HEAD WITHOUT CONTRAST TECHNIQUE: Contiguous axial images were obtained from the base of the skull through the vertex without intravenous contrast. COMPARISON:  02/10/2016 FINDINGS: Ventricles, cisterns and other CSF spaces are within normal. There is no mass, mass effect, shift of midline structures or acute hemorrhage. No evidence of acute infarction. Remaining bones and soft tissues are within normal. IMPRESSION: No acute intracranial findings. Electronically Signed   By: Marin Olp M.D.   On: 06/19/2016 12:52    EKG: Independently reviewed. Sinus rhythm   Assessment/Plan Principal Problem:   Stroke-like symptoms Active Problems:   ITP (idiopathic thrombocytopenic purpura) s/p splenectomy 1995   Depression with anxiety   GERD (gastroesophageal reflux disease)   Overweight   Hyperlipidemia, mixed   Pain in the chest   #1. Stroke like symptoms. Risk factors include obesity hyperlipidemia. Symptoms resolved on admission. Evaluated by neurology who opined TIA versus basilar migraine. 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 -Admit  to telemetry -CTA of the head and neck -MRI the brain -2-D echo -Lipid panel -Hemoglobin A1c -Hold aspirin related to ITP -consider statin -PT/OT -We'll await neurology recommendations  #2. Chest pain. Intermittent. Pain-free on admission. Initial troponin negative. EKG without acute changes -Cycle troponin -Serial EKG -gi cocktail -Lipid panel -No aspirin secondary ITP  #3. ITP status post splenectomy 1995. Platelets 47. No sign symptoms bleeding -monitor -continue OP follow up with PCP  #4. Depression with anxiety. Appears stable at baseline. -Continue home meds  #5. Hyperlipidemia. Not on a statin -Obtain a lipid panel  #6. GERD. Appears stable at baseline -Continue home PPI  #7. EtOH use. Patient reports drinking 4-5 beers per day. He denies ever having withdrawals. -Monitor -continue home benzos    DVT prophylaxis: none  Code Status: full  Family Communication: partner at bedside  Disposition Plan: home hopefully tomorrow  Consults called: neurology  Admission status: obs    Radene Gunning MD [facility]  If 7PM-7AM, please contact night-coverage [email or web address] Password [email or web address]  06/19/2016, 2:19 PM

## 2016-06-19 NOTE — ED Notes (Addendum)
Patient complains of stroke like symptoms that started yesterday morning at 0740.  Patient claims resolved at this time. s

## 2016-06-19 NOTE — Consult Note (Signed)
Requesting Physician: Dr. Marily Memos    Chief Complaint: TIA versus Basilar migraine/migraine variant.   History obtained from:  Patient     HPI:                                                                                                                                         Laura Bender is an 60 y.o. 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 She states her usual event will have a black spot in her vision or even a crescent shape.  She endorses having scotoma, multicolor like visual abnormalities, bright lights but not usually associated with any other symptoms. They usually last for about 30 minutes to one hour. She has never went to a neurologist for this. Today she had sudden onset of abdominal nausea, diaphoresis, quick bright lights in her visual field. She went to stand up and noted she kept listing to the right. This lasted for about 1 hour and resolved. Currently she is asymptomatic.   Date last known well: Today Time last known well: Time: 08:00 tPA Given: No: symptoms resolved   Past Medical History  Diagnosis Date  . Clotting disorder ([place])   . Lipoma 03-04-12    Rt. back about scapular ares  . 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  '95- blow normal levels, but stable.  . Fibromyalgia 03-04-12    Sporadic pain  . Depression 03-04-12    tx. meds  . PONV (postoperative nausea and vomiting)   . Allergic state 04/17/2013  . Depression with anxiety 04/17/2013  . Preventative health care 04/17/2013  . GERD (gastroesophageal reflux disease) 08/17/2013  . Solitary pulmonary nodule 08/17/2013    LLL seen on CT in March 2014  . Preventative health care 08/17/2013  . Advanced care planning/counseling discussion 09/01/2014  . Overweight 03/06/2015  . Hyperlipidemia, mixed 10/16/2015    Past Surgical History  Procedure Laterality Date  . Splenectomy, total  1995  .  Appendectomy  1995  . Irrigation and debridement abscess  03-04-12    x2- buttocks  . Parotid gland tumor excision Left     benign  . Breast surgery  03-04-12    12'12 -left needle biopsy(benign)-Titanium needle marker remains  . Mass excision  03/10/2012    Procedure: EXCISION MASS;  Surgeon: Odis Hollingshead, MD;  Location: [facility];  Service: General;  Laterality: N/A;  Removal of back lipoma  . Abdominal hysterectomy  1995    total, endometriosis, fibroid, ovarian atrophy  . Colonoscopy  2008    WNL, Dr Cristina Gong  . Cholecystectomy N/A 04/24/2013    Procedure: LAPAROSCOPIC CHOLECYSTECTOMY WITH INTRAOPERATIVE CHOLANGIOGRAM;  Surgeon: Haywood Lasso, MD;  Location: [facility];  Service: General;  Laterality: N/A;    Family History  Problem  Relation Age of Onset  . Heart disease Maternal Grandmother   . Heart disease Maternal Grandfather   . Heart attack Maternal Grandfather 62  . Heart disease Paternal Grandmother   . Heart attack Paternal Grandmother 86  . Heart disease Paternal Grandfather   . Cancer Paternal Grandfather     chest- smoker  . Diabetes Mother     type 2- controlled by diet  . Atrial fibrillation Father   . Hyperlipidemia Sister   . Depression Sister    Social History:  reports that she quit smoking about 33 years ago. She has never used smokeless tobacco. She reports that she drinks alcohol. She reports that she does not use illicit drugs.  Allergies:  Allergies  Allergen Reactions  . Codeine Nausea And Vomiting    Medications:                                                                                                                           ROS:                                                                                                                                       General ROS: negative for - chills, fatigue, fever, night sweats, weight gain or weight loss Psychological ROS: negative for - behavioral disorder, hallucinations, memory  difficulties, mood swings or suicidal ideation Ophthalmic ROS: negative for - blurry vision, double vision, eye pain or loss of vision ENT ROS: negative for - epistaxis, nasal discharge, oral lesions, sore throat, tinnitus or vertigo Allergy and Immunology ROS: negative for - hives or itchy/watery eyes Hematological and Lymphatic ROS: negative for - bleeding problems, bruising or swollen lymph nodes Endocrine ROS: negative for - galactorrhea, hair pattern changes, polydipsia/polyuria or temperature intolerance Respiratory ROS: negative for - cough, hemoptysis, shortness of breath or wheezing Cardiovascular ROS: negative for - chest pain, dyspnea on exertion, edema or irregular heartbeat Gastrointestinal ROS: negative for - abdominal pain, diarrhea, hematemesis, nausea/vomiting or stool incontinence Genito-Urinary ROS: negative for - dysuria, hematuria, incontinence or urinary frequency/urgency Musculoskeletal ROS: negative for - joint swelling or muscular weakness Neurological ROS: as noted in HPI Dermatological ROS: negative for rash and skin lesion changes  Neurologic Examination:  Blood pressure 105/87, pulse 64, temperature 97.8 F (36.6 C), temperature source Oral, resp. rate 16, height 5\' 8"  (1.727 m), weight 180 lb (81.647 kg), SpO2 98 %.  HEENT-  Normocephalic, no lesions, without obvious abnormality.  Normal external eye and conjunctiva.  Normal TM's bilaterally.  Normal auditory canals and external ears. Normal external nose, mucus membranes and septum.  Normal pharynx. Cardiovascular- , pulses palpable throughout   Neurological Examination Mental Status: Alert, oriented, thought content appropriate.  Speech fluent without evidence of aphasia.  Able to follow 3 step commands without difficulty. Cranial Nerves: II: Discs flat bilaterally; Visual fields grossly normal, pupils equal,  round, reactive to light and accommodation III,IV, VI: ptosis not present, extra-ocular motions intact bilaterally V,VII: smile symmetric, facial light touch sensation normal bilaterally VIII: hearing normal bilaterally IX,X: uvula rises symmetrically XI: bilateral shoulder shrug XII: midline tongue extension Motor: Right : Upper extremity   5/5    Left:     Upper extremity   5/5  Lower extremity   5/5     Lower extremity   5/5 Tone and bulk:normal tone throughout; no atrophy noted Sensory: Pinprick and light touch intact throughout, bilaterally Deep Tendon Reflexes: 2+ and symmetric throughout Plantars: Right: downgoing   Left: downgoing Cerebellar: normal finger-to-nose, normal rapid alternating movements and normal heel-to-shin test Gait: normal gait and station       Lab Results: Basic Metabolic Panel:  Recent Labs Lab 06/19/16 1208 06/19/16 1230  NA 137 140  K 4.3 4.3  CL 104 100*  CO2 26  --   GLUCOSE 107* 102*  BUN 14 18  CREATININE 0.90 0.80  CALCIUM 9.4  --     Liver Function Tests:  Recent Labs Lab 06/19/16 1208  AST 23  ALT 16  ALKPHOS 81  BILITOT 0.5  PROT 8.1  ALBUMIN 4.0   No results for input(s): LIPASE, AMYLASE in the last 168 hours. No results for input(s): AMMONIA in the last 168 hours.  CBC:  Recent Labs Lab 06/19/16 1208 06/19/16 1230  WBC 6.3  --   NEUTROABS 3.1  --   HGB 12.6 14.3  HCT 38.9 42.0  MCV 94.2  --   PLT 47*  --     Cardiac Enzymes: No results for input(s): CKTOTAL, CKMB, CKMBINDEX, TROPONINI in the last 168 hours.  Lipid Panel: No results for input(s): CHOL, TRIG, HDL, CHOLHDL, VLDL, LDLCALC in the last 168 hours.  CBG: No results for input(s): GLUCAP in the last 168 hours.  Microbiology: Results for orders placed or performed during the hospital encounter of 04/23/13  Surgical pcr screen     Status: None   Collection Time: 04/23/13  7:25 PM  Result Value Ref Range Status   MRSA, PCR NEGATIVE NEGATIVE  Final   Staphylococcus aureus NEGATIVE NEGATIVE Final    Comment:        The Xpert SA Assay (FDA approved for NASAL specimens in patients over 96 years of age), is one component of a comprehensive surveillance program.  Test performance has been validated by [employer] for patients greater than or equal to 74 year old. It is not intended to diagnose infection nor to guide or monitor treatment.    Coagulation Studies:  Recent Labs  06/19/16 1208  LABPROT 13.6  INR 1.02    Imaging: Ct Head Wo Contrast  06/19/2016  CLINICAL DATA:  Dizziness and fall.  Difficulty moving right arm. EXAM: CT HEAD WITHOUT CONTRAST TECHNIQUE: Contiguous axial images were obtained  from the base of the skull through the vertex without intravenous contrast. COMPARISON:  02/10/2016 FINDINGS: Ventricles, cisterns and other CSF spaces are within normal. There is no mass, mass effect, shift of midline structures or acute hemorrhage. No evidence of acute infarction. Remaining bones and soft tissues are within normal. IMPRESSION: No acute intracranial findings. Electronically Signed   By: Marin Olp M.D.   On: 06/19/2016 12:52       Assessment and plan discussed with with attending physician and they are in agreement.    Etta Quill PA-C [facility] [phone]  06/19/2016, 1:57 PM   Neurology Attending Addendum:  I have personally seen and examined the patient. I have reviewed the PAs note and agree with his findings, assessment, and plan with the following additions.  Laura Bender is a 60 year old [place] woman who is admitted to the hospital for evaluation of some right sided deficits. She reports that she was in her usual state of health while at work yesterday morning. She suddenly developed some visual changes which she describes as bright lights in her visual fields, she believes in both eyes. This was associated with some numbness on the right side of her face. She then tried to use her  computer mouse and realized that she was having trouble using her right hand and kept missing the mouse when she went to grab it. She was very nauseous so she got up to the bathroom, at which point she realizes she was also having difficulty using her right leg. She does not endorse actual weakness in her right arm or leg and never had any sensory changes in those extremities either. She feels that her symptoms peaked in about 90 seconds and then gradually resolved over the following hour. She denies any associated headache with these symptoms.   She has never had any prior similar symptoms. However, she does report that she has been diagnosed with ocular migraines and describes having black spots in her central vision surrounded by multicolored lights. These last anywhere from 30-60 minutes before resolving. She's never had any associated pain or headache with these episodes. She has a history of ITP and reports that her platelets generally run between 30,000 and 50,000.  Physical exam: General: This is a well-developed Caucasian woman resting comfortably in bed. She is alert and oriented. Speech is clear without dysarthria. She has no aphasia. Affect is bright, comportment is normal. Cranial nerves: Cranial nerves II through XII are tested and are unremarkable with the exception of subjectively reduced light touch over the right side of her face. Motor: Shows normal bulk, tone, strength throughout. Testing of the left elbow is limited by the presence of an IV in the left antecubital fossa which causes her discomfort when she tries to move the elbow. She has no drift. No abnormal movements are observed. Sensation: She reports intact light touch throughout. She has patchy decrease in pinprick, with right-sided decreased compared to the left. She also feels as if her right hand is somewhat decreased compared to the right foot while the left foot is somewhat decreased compared to the left hand. Vibration and  proprioception are normal. Coordination: Finger to nose is normal on the right. This was deferred on the left because of discomfort from her IV. Finger taps are normal and symmetric. Heel-to-shin is without dysmetria bilaterally. DTRs: 3+, symmetric. Toes are downgoing bilaterally. Hoffmann's is negative.  Diagnostic studies: I personally and independently reviewed MRI scan of the brain without contrast from 06/19/16. This  shows no evidence of acute ischemia or other intracranial pathology. She does have a minimal burden of T2/FLAIR hyperintensities in the bihemispheric white matter. These are nonspecific in character and it may be related to small vessel changes or even migraine.  I personally and independently reviewed the CT angiograms of the head and neck from 06/19/16. These show no evidence of vascular abnormalities.  Transthoracic echo is pending at the time of this dictation.  Impression: 1. Possible TIA: Her symptoms of right face numbness and difficulty controlling the right arm and leg are concerning for possible transient ischemic attack. Her underlying history of idiopathic thrombocytopenic purpura does increase risk of neurologic events including TIA and stroke. In addition, ITP has been associated with nonvascular transient neurologic symptoms as well. The patient's report of visual obscuration and nausea raises concern for possible migrainous phenomenon, though she had no associated headache. At this point, workup for TIA has been unrevealing with unremarkable MRI of the brain and CTA of the head and neck. Echo is still pending to complete the evaluation. Unfortunately, given her underlying ITP, she is likely not a suitable candidate for antiplatelet therapy and I would withhold this particularly given the uncertainty about the etiology of her symptoms. This can be revisited if an abnormality is noted on the echo that would require medical therapy with antiplatelets or anticoagulants. Her  other risk factors for stroke include hyperlipidemia.  2. Neck discomfort: This is somewhat nonspecific. She is really not endorsing any actual neck pain or headache. Instead, she states that she has a sense of tightness and discomfort around the occipital region bilaterally. This is typically worsened when she turns her head in either direction. At times, this can be accompanied by some lightheadedness. This sounds musculoskeletal in origin given the fact that it is triggered by movement of her head. She does not have anything to suggest occipital neuralgia by history or exam. Her mother does report that she has had increased stress recently and this may be driving some of her symptoms. Stress management was discussed. She may benefit from conservative measures including massage.  This was discussed with the patient and her family at the bedside. They are all in agreement with the plan as noted. There were given the opportunity to ask any questions and these were addressed to their satisfaction.

## 2016-06-20 ENCOUNTER — Observation Stay (HOSPITAL_BASED_OUTPATIENT_CLINIC_OR_DEPARTMENT_OTHER): Payer: BLUE CROSS/BLUE SHIELD

## 2016-06-20 DIAGNOSIS — R2990 Unspecified symptoms and signs involving the nervous system: Secondary | ICD-10-CM

## 2016-06-20 DIAGNOSIS — R079 Chest pain, unspecified: Secondary | ICD-10-CM

## 2016-06-20 DIAGNOSIS — E782 Mixed hyperlipidemia: Secondary | ICD-10-CM | POA: Diagnosis not present

## 2016-06-20 DIAGNOSIS — F418 Other specified anxiety disorders: Secondary | ICD-10-CM

## 2016-06-20 DIAGNOSIS — D693 Immune thrombocytopenic purpura: Secondary | ICD-10-CM

## 2016-06-20 DIAGNOSIS — G459 Transient cerebral ischemic attack, unspecified: Secondary | ICD-10-CM

## 2016-06-20 DIAGNOSIS — D696 Thrombocytopenia, unspecified: Secondary | ICD-10-CM

## 2016-06-20 DIAGNOSIS — G43109 Migraine with aura, not intractable, without status migrainosus: Secondary | ICD-10-CM

## 2016-06-20 LAB — RAPID URINE DRUG SCREEN, HOSP PERFORMED
AMPHETAMINES: NOT DETECTED
BARBITURATES: NOT DETECTED
BENZODIAZEPINES: POSITIVE — AB
COCAINE: NOT DETECTED
Opiates: NOT DETECTED
TETRAHYDROCANNABINOL: NOT DETECTED

## 2016-06-20 LAB — TROPONIN I: Troponin I: <0.03 ng/mL (ref <0.03)

## 2016-06-20 LAB — HEMOGLOBIN A1C
HEMOGLOBIN A1C: 4.8 % (ref 4.8–5.6)
MEAN PLASMA GLUCOSE: 91 mg/dL

## 2016-06-20 LAB — ECHOCARDIOGRAM COMPLETE
Height: 68 in
Weight: 2880 oz

## 2016-06-20 MED ORDER — ASPIRIN EC 81 MG PO TBEC
81.0000 mg | DELAYED_RELEASE_TABLET | Freq: Every day | ORAL | Status: DC
  Filled 2016-06-20: qty 1

## 2016-06-20 MED ORDER — PERFLUTREN LIPID MICROSPHERE
INTRAVENOUS | Status: AC
  Administered 2016-06-20: 2 mL
  Filled 2016-06-20: qty 10

## 2016-06-20 MED ORDER — TOPIRAMATE 25 MG PO TABS
50.0000 mg | ORAL_TABLET | Freq: Two times a day (BID) | ORAL | Status: DC
  Administered 2016-06-20: 50 mg via ORAL
  Filled 2016-06-20: qty 2

## 2016-06-20 MED ORDER — ATORVASTATIN CALCIUM 10 MG PO TABS: 10.0000 mg | ORAL_TABLET | Freq: Every day | ORAL | Status: DC

## 2016-06-20 MED ORDER — TOPIRAMATE 50 MG PO TABS: 50.0000 mg | ORAL_TABLET | Freq: Two times a day (BID) | ORAL | Status: DC

## 2016-06-20 MED FILL — TOPIRAMATE 50 MG TABLET: 50 | 30 days supply | Qty: 60 | Fill #0

## 2016-06-20 NOTE — Evaluation (Signed)
Occupational Therapy Evaluation Patient Details Name: Laura Bender MRN: [id number] DOB: 1956/02/06 Today's Date: 06/20/2016    History of Present Illness 60 yo female admitted with R side weakness and numbness. pt with x3 weeks of migraines MRI (-) PMH: clotting disorder, fibromyalgia, depression, PONV, GERD, anxiety, ETOH,    Clinical Impression   Patient evaluated by Occupational Therapy with no further acute OT needs identified. All education has been completed and the patient has no further questions. See below for any follow-up Occupational Therapy or equipment needs. OT to sign off. Thank you for referral.   Spoke with PT concerning patients description of vestibular deficits. Defer further workup and any treatment needs to PT    Follow Up Recommendations  No OT follow up    Equipment Recommendations  None recommended by OT    Recommendations for Other Services       Precautions / Restrictions Precautions Precautions: None      Mobility Bed Mobility Overal bed mobility: Independent                Transfers Overall transfer level: Independent                    Balance                                            ADL Overall ADL's : Independent                                       General ADL Comments: pt completed 2 worksheet visual scanning task . pt able to read menu. pt able to recall information after 5 minutes. pt able to able to complete simple money management task.      Vision Vision Assessment?: No apparent visual deficits   Perception     Praxis      Pertinent Vitals/Pain Pain Assessment: No/denies pain     Hand Dominance Right   Extremity/Trunk Assessment Upper Extremity Assessment Upper Extremity Assessment: RUE deficits/detail RUE Deficits / Details: reports sensation 80% normal,    Lower Extremity Assessment Lower Extremity Assessment: RLE deficits/detail RLE Deficits /  Details: reports 80% normal sensation   Cervical / Trunk Assessment Cervical / Trunk Assessment: Normal   Communication Communication Communication: No difficulties   Cognition Arousal/Alertness: Awake/alert Behavior During Therapy: WFL for tasks assessed/performed Overall Cognitive Status: Within Functional Limits for tasks assessed                     General Comments       Exercises       Shoulder Instructions      Home Living Family/patient expects to be discharged to:: Private residence Living Arrangements: Spouse/significant other Available Help at Discharge: Family;Available PRN/intermittently Type of Home: House Home Access: Stairs to enter [employer] of Steps: 3   Home Layout: One level     Bathroom Shower/Tub: Occupational psychologist: Standard     Home Equipment: None   Additional Comments: DRIVING , WORKING OFFICE MANAGER AT [employer]      Prior Functioning/Environment Level of Independence: Independent             OT Diagnosis:     OT Problem List:     OT Treatment/Interventions:  OT Goals(Current goals can be found in the care plan section)    OT Frequency:     Barriers to D/C:            Co-evaluation              End of Session Nurse Communication: Mobility status;Precautions  Activity Tolerance: Patient tolerated treatment well Patient left: in bed;with call bell/phone within reach;with family/visitor present   Time: [id number] OT Time Calculation (min): 21 min Charges:  OT General Charges $OT Visit: 1 Procedure OT Evaluation $OT Eval Low Complexity: 1 Procedure G-Codes: OT G-codes **NOT FOR INPATIENT CLASS** Functional Assessment Tool Used: clincial judgement Functional Limitation: Self care Self Care Current Status [id number]): 0 percent impaired, limited or restricted Self Care Goal Status [id number]): 0 percent impaired, limited or restricted Self Care Discharge Status [id number]): 0 percent  impaired, limited or restricted  Parke Poisson B 06/20/2016, 9:51 AM   Jeri Modena   OTR/L Pager: [phone] Office: [phone] .

## 2016-06-20 NOTE — Telephone Encounter (Signed)
Noted and agree. 

## 2016-06-20 NOTE — Discharge Summary (Signed)
Physician Discharge Summary  Laura Bender [id number] DOB: [birth year] DOA: 06/19/2016  PCP: Penni Homans, MD  Admit date: 06/19/2016 Discharge date: 06/20/2016  Time spent: 45 minutes  Recommendations for Outpatient Follow-up:  1. Migraine follow-up   Discharge Diagnoses:  Principal Problem:   Stroke-like symptoms Active Problems:   ITP (idiopathic thrombocytopenic purpura) s/p splenectomy 1995   Depression with anxiety   GERD (gastroesophageal reflux disease)   Overweight   Hyperlipidemia, mixed   Pain in the chest   Discharge Condition: stable  Diet recommendation: low salt heart healthy  Filed Weights   06/19/16 1203  Weight: 81.647 kg (180 lb)    History of present illness:  Laura Bender is a 60 y.o. female with medical history significant for ITP, fibromyalgia, depression anxiety, GERD, hyperlipidemia presents to emergency department with chief complaint right-sided weakness with some tingling of the right side of her face and her right hand and intermittent chest pain. Initial evaluation concerning for TIA.   Information is obtained from the patient and the chart. She reports noticing some right-sided facial tingling yesterday as well as right leg did "did not feel right". She denies a heaviness and numbness or tingling or weakness. In addition she reports yesterday episode of visual disturbances in the form of black spot in her vision as well as bright light yesterday while sitting at her desk. In addition she noted that she would try to place her hand on her computer mouse and would miss. Associated symptoms included nausea without vomiting, diaphoresis unsteady gait generalized weakness. She reports some dizziness. She said this episode lasted about 45 minutes and just resolved itself. She said she felt okay the rest of the day ate a normal dinner and slept through the night. Upon awakening this morning she "did not feel right". She called her PCP who  recommended she go to the emergency department. She also reports intermittent chest pain located substernal. Describes it as sharp lasting 1-2 minutes nonradiating. She denies palpitation shortness of breath headache dizziness. She reports these episodes weekly. She denies dysuria hematuria frequency or urgency. She denies abdominal pain diarrhea constipation melena bright red blood per rectum  Hospital Course:   Complicated Migraine/Migraine equivalent  MRI  No acute stroke  CTA H&N  Negative  2D Echo  EF 65-70%   LDL 109  HgbA1c 4.8  SCDs ordered for VTE prophylaxis  Diet Heart Room service appropriate?: Yes; Fluid consistency:: Thin  No antithrombotic prior to admission, now on aspirin 81 mg daily. Discontinued ASA due to thrombocytopenia < 50. May consider ASA for stroke prevention if PLT constantly > 50.  Recommend addition of topamax for migraine prevension, 50 mg bid added  Ongoing aggressive stroke risk factor management  Therapy recommendations:  No OT  Disposition:  Return home  Follow up with carolyn Hassell Done NP at [facility] for HA management. Order written  CP - triple r/o neg, serial EKG no acute change  ETOH Use - no withdrawal symptoms  Consultations:  neuro  Discharge Exam: Filed Vitals:   06/20/16 1431 06/20/16 1700  BP: 129/70 128/71  Pulse: 68 72  Temp: 98 F (36.7 C) 98.1 F (36.7 C)  Resp: 18 18     General:  No diaphoresis, anxious, no acute distress  Cardiovascular: Regular rate and rhythm no murmurs rubs or gallops  Respiratory: Clear to auscultation bilaterally no more breathing  Abdomen: Nondistended bowel sounds normal nontender palpation  Musculoskeletal: Moving all extremities, no deformity, 5 out of 5 strength  Discharge Instructions   Discharge Instructions    Ambulatory referral to Neurology    Complete by:  As directed   An appointment is requested in 2 month with Cecille Rubin NP.          Current Discharge  Medication List    START taking these medications   Details  topiramate (TOPAMAX) 50 MG tablet Take 1 tablet (50 mg total) by mouth 2 (two) times daily. Qty: 60 tablet, Refills: 0      CONTINUE these medications which have NOT CHANGED   Details  acetaminophen (TYLENOL) 500 MG tablet Take 1,000 mg by mouth every 6 (six) hours as needed for pain.    ALPRAZolam (XANAX) 0.25 MG tablet TAKE 1 TABLET BY MOUTH TWICE DAILY AS NEEDED FOR SLEEP OR ANXIETY Qty: 20 tablet, Refills: 2    Calcium Carb-Cholecalciferol (CALCIUM 600 + D PO) Take 1 tablet by mouth daily.    Cholecalciferol (VITAMIN D) 2000 units CAPS Take 2,000 Units by mouth daily.    clorazepate (TRANXENE) 7.5 MG tablet Take 1 tablet (7.5 mg total) by mouth at bedtime. Qty: 90 tablet, Refills: 0    docusate sodium (COLACE) 100 MG capsule Take 100 mg by mouth daily.    FLUoxetine (PROZAC) 20 MG capsule TAKE 2 CAPSULES BY MOUTH DAILY Qty: 60 capsule, Refills: 5    fluticasone (FLONASE) 50 MCG/ACT nasal spray Place 2 sprays into both nostrils daily. Qty: 16 g, Refills: 6   Associated Diagnoses: Vertigo    meclizine (ANTIVERT) 25 MG tablet TAKE 1 TABLET (25 MG) BY MOUTH 3 TIMES A DAY AS NEEDED FOR DIZZINESS OR NAUSEA. Qty: 30 tablet, Refills: 0    Multiple Vitamins-Minerals (HAIR/SKIN/NAILS PO) Take 3 tablets by mouth daily.    PREMARIN 1.25 MG tablet TAKE 1 TABLET BY MOUTH DAILY. Qty: 30 tablet, Refills: 5    ranitidine (ZANTAC) 150 MG tablet Take 150 mg by mouth at bedtime.    traZODone (DESYREL) 50 MG tablet TAKE 1 AND 1/2 TABLETS (75 MG TOTAL) BY MOUTH AT BEDTIME. Qty: 135 tablet, Refills: 1       Allergies  Allergen Reactions  . Codeine Nausea And Vomiting   Follow-up Information    Follow up with Dennie Bible, NP. Schedule an appointment as soon as possible for a visit in 2 months.   Specialty:  Family Medicine   Why:  for hospital follow up, for headache management, stroke clinic   Contact  information:   [place] [address] [phone]       Follow up with Penni Homans, MD.   Specialty:  Family Medicine   Why:  check cbc since on topamax, stroke-like event   Contact information:   [place] [address] [phone]        The results of significant diagnostics from this hospitalization (including imaging, microbiology, ancillary and laboratory) are listed below for reference.    Significant Diagnostic Studies: Ct Angio Head W/cm &/or Wo Cm  06/19/2016  CLINICAL DATA:  60 year old female with episode of dizziness yesterday, TIAs possibly from posterior circulation disease. Initial encounter. Personal history of left parotid mass excision 15 years ago, reportedly benign. EXAM: CT ANGIOGRAPHY HEAD AND NECK TECHNIQUE: Multidetector CT imaging of the head and neck was performed using the standard protocol during bolus administration of intravenous contrast. Multiplanar CT image reconstructions and MIPs were obtained to evaluate the vascular anatomy. Carotid stenosis measurements (when applicable) are obtained utilizing NASCET  criteria, using the distal internal carotid diameter as the denominator. CONTRAST:  50 mL Isovue 370 COMPARISON:  Head CT without contrast 1230 hours today. Neck CT 02/13/2016. FINDINGS: CTA NECK Skeleton:  No acute osseous abnormality identified. Visualized paranasal sinuses and mastoids are stable and well pneumatized. Other neck: Negative lung apices. No superior mediastinal lymphadenopathy. Negative thyroid, larynx, pharynx, parapharyngeal spaces, retropharyngeal space, sublingual space, submandibular glands, and right parotid gland. There is continued abnormal soft tissue nodularity along the inferior left parotid gland. This has a nodular reversed C-shaped configuration (series 9, image 159) and appears stable since March measuring up to 15 mm in thickness. This is inseparable from an overlying skin  incision. Regional left level 2 lymph nodes are stable and within normal limits. No cervical lymphadenopathy or new soft tissue mass. Aortic arch: Borderline bovine type arch configuration. No arch atherosclerosis or great vessel origin stenosis. Right carotid system: Negative. Left carotid system: Negative. Vertebral arteries:No proximal subclavian stenosis. Both vertebral artery origins are normal (the left on series 8, image 122 an the right on image 116). Codominant vertebral arteries are patent and appear normal to the skullbase. CTA HEAD Posterior circulation: Normal distal vertebral arteries. Both PICA origins appear normal. Normal vertebrobasilar junction. No distal vertebral artery or basilar artery stenosis. Normal SCA origins. The PCA origins are somewhat shared from the basilar tip (series 11, image 22). Posterior communicating arteries are diminutive or absent. Bilateral PCA branches are within normal limits. Anterior circulation: Both ICA siphons are patent without stenosis. There is minimal calcified plaque on the right. No siphon aneurysm. Patent carotid termini. Dominant left A1 segment, right A1 is diminutive. Anterior communicating artery and bilateral ACA branches are within normal limits. Left MCA M1 segment, bifurcation, and left MCA branches are normal. Right MCA M1 segment, trifurcation and right MCA branches are normal. Venous sinuses: Patent. Anatomic variants: Dominant left ACA A1 segment. Delayed phase: Stable and normal gray-white matter differentiation. No cortically based acute infarct identified. No abnormal enhancement identified. IMPRESSION: 1. Negative CTA head and neck. No atherosclerosis or stenosis in the neck. Minimal right ICA siphon calcified plaque. No intracranial aneurysm or stenosis identified. 2. Continued abnormal soft tissue nodularity along the posterior and inferior left parotid space and inseparable from an overlying skin incision. 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 3. Stable and negative CT appearance of the brain. Electronically Signed   By: Genevie Ann M.D.   On: 06/19/2016 14:51   Dg Chest 2 View  06/19/2016  CLINICAL DATA:  Right-sided weakness. EXAM: CHEST  2 VIEW COMPARISON:  04/22/2013. FINDINGS: The lungs are clear wiithout focal pneumonia, edema, pneumothorax or pleural effusion. Nodular density/densities projecting over the lungs are compatible with pads for telemetry leads. The cardiopericardial silhouette is within normal limits for size. The visualized bony structures of the thorax are intact. IMPRESSION: No active cardiopulmonary disease. Electronically Signed   By: Misty Stanley M.D.   On: 06/19/2016 14:52   Ct Head Wo Contrast  06/19/2016  CLINICAL DATA:  Dizziness and fall.  Difficulty moving right arm. EXAM: CT HEAD WITHOUT CONTRAST TECHNIQUE: Contiguous axial images were obtained from the base of the skull through the vertex without intravenous contrast. COMPARISON:  02/10/2016 FINDINGS: Ventricles, cisterns and other CSF spaces are within normal. There is no mass, mass effect, shift of midline structures or acute hemorrhage. No evidence  of acute infarction. Remaining bones and soft tissues are within normal. IMPRESSION: No acute intracranial findings. Electronically Signed   By: Marin Olp M.D.   On: 06/19/2016 12:52   Ct Angio Neck W Or Wo Contrast  06/19/2016  CLINICAL DATA:  60 year old female with episode of dizziness yesterday, TIAs possibly from posterior circulation disease. Initial encounter. Personal history of left parotid mass excision 15 years ago, reportedly benign. EXAM: CT ANGIOGRAPHY HEAD AND NECK TECHNIQUE: Multidetector CT imaging of the head and neck was performed using the standard protocol during bolus administration of intravenous contrast. Multiplanar CT image  reconstructions and MIPs were obtained to evaluate the vascular anatomy. Carotid stenosis measurements (when applicable) are obtained utilizing NASCET criteria, using the distal internal carotid diameter as the denominator. CONTRAST:  50 mL Isovue 370 COMPARISON:  Head CT without contrast 1230 hours today. Neck CT 02/13/2016. FINDINGS: CTA NECK Skeleton:  No acute osseous abnormality identified. Visualized paranasal sinuses and mastoids are stable and well pneumatized. Other neck: Negative lung apices. No superior mediastinal lymphadenopathy. Negative thyroid, larynx, pharynx, parapharyngeal spaces, retropharyngeal space, sublingual space, submandibular glands, and right parotid gland. There is continued abnormal soft tissue nodularity along the inferior left parotid gland. This has a nodular reversed C-shaped configuration (series 9, image 159) and appears stable since March measuring up to 15 mm in thickness. This is inseparable from an overlying skin incision. Regional left level 2 lymph nodes are stable and within normal limits. No cervical lymphadenopathy or new soft tissue mass. Aortic arch: Borderline bovine type arch configuration. No arch atherosclerosis or great vessel origin stenosis. Right carotid system: Negative. Left carotid system: Negative. Vertebral arteries:No proximal subclavian stenosis. Both vertebral artery origins are normal (the left on series 8, image 122 an the right on image 116). Codominant vertebral arteries are patent and appear normal to the skullbase. CTA HEAD Posterior circulation: Normal distal vertebral arteries. Both PICA origins appear normal. Normal vertebrobasilar junction. No distal vertebral artery or basilar artery stenosis. Normal SCA origins. The PCA origins are somewhat shared from the basilar tip (series 11, image 22). Posterior communicating arteries are diminutive or absent. Bilateral PCA branches are within normal limits. Anterior circulation: Both ICA siphons are  patent without stenosis. There is minimal calcified plaque on the right. No siphon aneurysm. Patent carotid termini. Dominant left A1 segment, right A1 is diminutive. Anterior communicating artery and bilateral ACA branches are within normal limits. Left MCA M1 segment, bifurcation, and left MCA branches are normal. Right MCA M1 segment, trifurcation and right MCA branches are normal. Venous sinuses: Patent. Anatomic variants: Dominant left ACA A1 segment. Delayed phase: Stable and normal gray-white matter differentiation. No cortically based acute infarct identified. No abnormal enhancement identified. IMPRESSION: 1. Negative CTA head and neck. No atherosclerosis or stenosis in the neck. Minimal right ICA siphon calcified plaque. No intracranial aneurysm or stenosis identified. 2. Continued abnormal soft tissue nodularity along the posterior and inferior left parotid space and inseparable from an overlying skin incision. 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 3. Stable and negative CT appearance of the brain. Electronically Signed   By: Genevie Ann M.D.   On: 06/19/2016 14:51   Mr Brain Wo Contrast  06/19/2016  CLINICAL DATA:  60 year old female with episode of dizziness yesterday, TIAs possibly from posterior circulation disease. Initial encounter. EXAM: MRI HEAD WITHOUT CONTRAST TECHNIQUE: Multiplanar, multiecho  pulse sequences of the brain and surrounding structures were obtained without intravenous contrast. COMPARISON:  CTA head and neck 1417 hours today reported separately. FINDINGS: Cerebral volume is normal. No restricted diffusion to suggest acute infarction. No midline shift, mass effect, evidence of mass lesion, ventriculomegaly, extra-axial collection or acute intracranial hemorrhage. Cervicomedullary junction and pituitary are within normal limits. Major  intracranial vascular flow voids appear normal. Pearline Cables and white matter signal is within normal limits for age throughout the brain. No cortical encephalomalacia or chronic cerebral blood products identified. Visible internal auditory structures appear normal. Mastoids are clear. Negative visualized cervical spine. Normal bone marrow signal. Negative orbit and scalp soft tissues. Paranasal sinuses are clear. Left inferior parotid space nodular soft tissue re- demonstrated (series 2, image 21 and series 10, image 12). IMPRESSION: 1. Normal for age noncontrast MRI appearance of the brain. 2. Left inferior parotid space nodular soft tissue re- demonstrated, see also neck CTA from today. Electronically Signed   By: Genevie Ann M.D.   On: 06/19/2016 16:07    Microbiology: No results found for this or any previous visit (from the past 240 hour(s)).   Labs: Basic Metabolic Panel:  Recent Labs Lab 06/19/16 1208 06/19/16 1230  NA 137 140  K 4.3 4.3  CL 104 100*  CO2 26  --   GLUCOSE 107* 102*  BUN 14 18  CREATININE 0.90 0.80  CALCIUM 9.4  --    Liver Function Tests:  Recent Labs Lab 06/19/16 1208  AST 23  ALT 16  ALKPHOS 81  BILITOT 0.5  PROT 8.1  ALBUMIN 4.0   No results for input(s): LIPASE, AMYLASE in the last 168 hours. No results for input(s): AMMONIA in the last 168 hours. CBC:  Recent Labs Lab 06/19/16 1208 06/19/16 1230  WBC 6.3  --   NEUTROABS 3.1  --   HGB 12.6 14.3  HCT 38.9 42.0  MCV 94.2  --   PLT 47*  --    Cardiac Enzymes:  Recent Labs Lab 06/19/16 1606 06/20/16 0347  TROPONINI <0.03 <0.03   BNP: BNP (last 3 results) No results for input(s): BNP in the last 8760 hours.  ProBNP (last 3 results) No results for input(s): PROBNP in the last 8760 hours.  CBG: No results for input(s): GLUCAP in the last 168 hours.     Signed:  Elwin Mocha MD  FACP  [facility] 06/20/2016, 5:49 PM

## 2016-06-20 NOTE — Evaluation (Signed)
Physical Therapy Evaluation Patient Details Name: Laura Bender MRN: [id number] DOB: 04/11/1956 Today's Date: 06/20/2016   History of Present Illness  60 yo female admitted with R side weakness and numbness. pt with x3 weeks of migraines MRI (-) PMH: clotting disorder, fibromyalgia, depression, PONV, GERD, anxiety, ETOH,   Clinical Impression  Pt is at or close to baseline functioning and should be safe at home . There are no further acute PT needs.  Will sign off at this time.     Follow Up Recommendations No PT follow up    Equipment Recommendations  None recommended by PT    Recommendations for Other Services       Precautions / Restrictions Precautions Precautions: None      Mobility  Bed Mobility Overal bed mobility: Independent                Transfers Overall transfer level: Independent                  Ambulation/Gait Ambulation/Gait assistance: Independent Ambulation Distance (Feet): 700 Feet Assistive device: None   Gait velocity: functional Gait velocity interpretation: >2.62 ft/sec, indicative of independent community ambulator General Gait Details: steady and smooth  see DGI  Stairs Stairs: Yes Stairs assistance: Independent Stair Management: No rails Number of Stairs: 12 General stair comments: safe and fluid  Wheelchair Mobility    Modified Rankin (Stroke Patients Only)       Balance Overall balance assessment: No apparent balance deficits (not formally assessed);Independent                               Standardized Balance Assessment Standardized Balance Assessment : Berg Balance Test;Dynamic Gait Index Berg Balance Test Sit to Stand: Able to stand without using hands and stabilize independently Standing Unsupported: Able to stand safely 2 minutes Sitting with Back Unsupported but Feet Supported on Floor or Stool: Able to sit safely and securely 2 minutes Stand to Sit: Sits safely with minimal use of  hands Transfers: Able to transfer safely, minor use of hands Standing Unsupported with Eyes Closed: Able to stand 10 seconds safely Standing Ubsupported with Feet Together: Able to place feet together independently and stand 1 minute safely From Standing, Reach Forward with Outstretched Arm: Can reach confidently >25 cm (10") From Standing Position, Pick up Object from Floor: Able to pick up shoe safely and easily From Standing Position, Turn to Look Behind Over each Shoulder: Looks behind from both sides and weight shifts well Turn 360 Degrees: Able to turn 360 degrees safely in 4 seconds or less Standing Unsupported, Alternately Place Feet on Step/Stool: Able to stand independently and safely and complete 8 steps in 20 seconds Standing Unsupported, One Foot in Front: Able to place foot tandem independently and hold 30 seconds Standing on One Leg: Able to lift leg independently and hold > 10 seconds Total Score: 56 Dynamic Gait Index Level Surface: Normal Change in Gait Speed: Normal Gait with Horizontal Head Turns: Normal Gait with Vertical Head Turns: Normal Gait and Pivot Turn: Normal Step Over Obstacle: Normal Step Around Obstacles: Normal Steps: Normal Total Score: 24       Pertinent Vitals/Pain Pain Assessment: No/denies pain    Home Living Family/patient expects to be discharged to:: Private residence Living Arrangements: Spouse/significant other Available Help at Discharge: Family;Available PRN/intermittently Type of Home: House Home Access: Stairs to enter   Entrance Stairs-Number of Steps: 3 Home Layout: One  level Home Equipment: None Additional Comments: DRIVING , WORKING OFFICE MANAGER AT [employer]    Prior Function Level of Independence: Independent               Hand Dominance   Dominant Hand: Right    Extremity/Trunk Assessment   Upper Extremity Assessment: RUE deficits/detail RUE Deficits / Details: reports sensation 80% normal,           Lower Extremity Assessment: Overall WFL for tasks assessed RLE Deficits / Details: reports 80% normal sensation    Cervical / Trunk Assessment: Normal  Communication   Communication: No difficulties  Cognition Arousal/Alertness: Awake/alert Behavior During Therapy: WFL for tasks assessed/performed Overall Cognitive Status: Within Functional Limits for tasks assessed                      General Comments General comments (skin integrity, edema, etc.): pt did report dizziness at times, less vertigo than dizziness.  Assessed for BPPV with [facility] without any symptomes  R or Left side.    Exercises        Assessment/Plan    PT Assessment Patent does not need any further PT services  PT Diagnosis     PT Problem List    PT Treatment Interventions     PT Goals (Current goals can be found in the Care Plan section) Acute Rehab PT Goals PT Goal Formulation: All assessment and education complete, DC therapy    Frequency     Barriers to discharge        Co-evaluation               End of Session   Activity Tolerance: Patient tolerated treatment well Patient left: in bed;with call bell/phone within reach;with family/visitor present;with SCD's reapplied Nurse Communication: Mobility status    Functional Assessment Tool Used: clinical judgement Functional Limitation: Mobility: Walking and moving around Mobility: Walking and Moving Around Current Status [phone]): 0 percent impaired, limited or restricted Mobility: Walking and Moving Around Goal Status [phone]): 0 percent impaired, limited or restricted Mobility: Walking and Moving Around Discharge Status [phone]): 0 percent impaired, limited or restricted    Time: 0953-1030 PT Time Calculation (min) (ACUTE ONLY): 37 min   Charges:   PT Evaluation $PT Eval Moderate Complexity: 1 Procedure PT Treatments $Gait Training: 8-22 mins   PT G Codes:   PT G-Codes **NOT FOR INPATIENT CLASS** Functional  Assessment Tool Used: clinical judgement Functional Limitation: Mobility: Walking and moving around Mobility: Walking and Moving Around Current Status [id number]): 0 percent impaired, limited or restricted Mobility: Walking and Moving Around Goal Status [id number]): 0 percent impaired, limited or restricted Mobility: Walking and Moving Around Discharge Status [phone]): 0 percent impaired, limited or restricted    Vergia Chea, Tessie Fass 06/20/2016, 10:48 AM 06/20/2016  Donnella Sham, PT [phone]  (pager)

## 2016-06-20 NOTE — Progress Notes (Signed)
STROKE TEAM PROGRESS NOTE   HISTORY OF PRESENT ILLNESS (per record) Laura Bender is an 60 y.o. 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 She states her usual event will have a black spot in her vision or even a crescent shape. She endorses having scotoma, multicolor like visual abnormalities, bright lights but not usually associated with any other symptoms. They usually last for about 30 minutes to one hour. She has never went to a neurologist for this. Today she had sudden onset of abdominal nausea, diaphoresis, quick bright lights in her visual field. She went to stand up and noted she kept listing to the right. This lasted for about 1 hour and resolved. Currently she is asymptomatic. She was LKW 06/19/2016 at 08:00. Patient was not administered IV t-PA secondary to  symptoms resolved. She was admitted for further evaluation and treatment.   SUBJECTIVE (INTERVAL HISTORY) Her mother is at the bedside.  Overall she feels her condition is stable. She recounted her HPI with me, including hx of ocular migraines. Her symptoms consistent with complicated migraine without HA. less likely TIA. She also in the process of tapering off estrogen. She still has thrombocytopenia, not a ASA candidate at this time. She did well with PT today.    OBJECTIVE Temp:  [96.7 F (35.9 C)-98.7 F (37.1 C)] 98 F (36.7 C) (07/19 0941) Pulse Rate:  [61-79] 67 (07/19 0941) Cardiac Rhythm:  [-] Sinus bradycardia (07/19 0700) Resp:  [15-19] 18 (07/19 0941) BP: (100-154)/(59-87) 100/83 mmHg (07/19 0941) SpO2:  [96 %-100 %] 100 % (07/19 0941) Weight:  [81.647 kg (180 lb)] 81.647 kg (180 lb) (07/18 1203)  CBC:  Recent Labs Lab 06/19/16 1208 06/19/16 1230  WBC 6.3  --   NEUTROABS 3.1  --   HGB 12.6 14.3  HCT 38.9 42.0  MCV 94.2  --   PLT 47*  --     Basic Metabolic Panel:  Recent Labs Lab 06/19/16 1208  06/19/16 1230  NA 137 140  K 4.3 4.3  CL 104 100*  CO2 26  --   GLUCOSE 107* 102*  BUN 14 18  CREATININE 0.90 0.80  CALCIUM 9.4  --     Lipid Panel:    Component Value Date/Time   CHOL 201* 06/19/2016 1606   TRIG 130 06/19/2016 1606   HDL 66 06/19/2016 1606   CHOLHDL 3.0 06/19/2016 1606   VLDL 26 06/19/2016 1606   LDLCALC 109* 06/19/2016 1606   HgbA1c:  Lab Results  Component Value Date   HGBA1C 4.8 06/19/2016   Urine Drug Screen:    Component Value Date/Time   LABOPIA NONE DETECTED 06/20/2016 0834   COCAINSCRNUR NONE DETECTED 06/20/2016 0834   LABBENZ POSITIVE* 06/20/2016 0834   AMPHETMU NONE DETECTED 06/20/2016 0834   THCU NONE DETECTED 06/20/2016 0834   LABBARB NONE DETECTED 06/20/2016 0834      IMAGING I have personally reviewed the radiological images below and agree with the radiology interpretations.  Ct Head Wo Contrast 06/19/2016  No acute intracranial findings.  Ct Angio Head & Neck W Or Wo Contrast 06/19/2016  1. Negative CTA head and neck. No atherosclerosis or stenosis in the neck. Minimal right ICA siphon calcified plaque. No intracranial aneurysm or stenosis identified. 2. Continued abnormal soft tissue nodularity along the posterior and inferior left parotid space and inseparable from an overlying skin incision. 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 3. Stable and negative CT appearance of the brain.   Mr Brain Wo Contrast 06/19/2016  1. Normal for age noncontrast MRI appearance of the brain. 2. Left inferior parotid space nodular soft tissue re- demonstrated, see also neck CTA from today.   TTE - LVEF 60-65%, normal wall thickness, normal wall motion, diastolic  dysfunction, normal LV filling pressure, trivial MR, normal LA  size, possible small PFO, mild TR, RVSP 37 mmHg, elevated RA  pressure of 8  mmHg estimated.   PHYSICAL EXAM  Temp:  [96.7 F (35.9 C)-98.7 F (37.1 C)] 98 F (36.7 C) (07/19 1431) Pulse Rate:  [67-78] 68 (07/19 1431) Resp:  [15-18] 18 (07/19 1431) BP: (100-132)/(59-83) 129/70 mmHg (07/19 1431) SpO2:  [96 %-100 %] 98 % (07/19 1431)  General - Well nourished, well developed, in no apparent distress.  Ophthalmologic - Fundi not visualized due to small pupils.  Cardiovascular - Regular rate and rhythm.  Mental Status -  Level of arousal and orientation to time, place, and person were intact. Language including expression, naming, repetition, comprehension was assessed and found intact. Attention span and concentration were normal. Recent and remote memory were intact. Fund of Knowledge was assessed and was intact.  Cranial Nerves II - XII - II - Visual field intact OU. III, IV, VI - Extraocular movements intact. V - Facial sensation mildly decreased on the right cheek. VII - Facial movement intact bilaterally. VIII - Hearing & vestibular intact bilaterally. X - Palate elevates symmetrically. XI - Chin turning & shoulder shrug intact bilaterally. XII - Tongue protrusion intact.  Motor Strength - The patient's strength was normal in all extremities and pronator drift was absent.  Bulk was normal and fasciculations were absent.   Motor Tone - Muscle tone was assessed at the neck and appendages and was normal.  Reflexes - The patient's reflexes were 1+ in all extremities and she had no pathological reflexes  Sensory - Light touch, temperature/pinprick were assessed and were symmetrical.    Coordination - The patient had normal movements in the hands and feet with no ataxia or dysmetria.  Tremor was absent.  Gait and Station - The patient's transfers, posture, gait, station, and turns were observed as normal.   ASSESSMENT/PLAN Laura Bender is a 59 y.o. female with history of ITP, fibromyalgia, GERD, pulmonary nodules, HLD and migraines  presenting with abdominal nausea, diaphoresis, quick bright lights in her visual field, listing to the R x 1 hr. She did not receive IV t-PA due to symptoms resolved.   Complicated Migraine/Migraine equivalent  MRI  No acute stroke  CTA H&N  Negative  2D Echo  EF 65-70%   LDL 109  HgbA1c 4.8  SCDs ordered for VTE prophylaxis  Diet Heart Room service appropriate?: Yes; Fluid consistency:: Thin  No antithrombotic prior to admission, now on aspirin 81 mg daily. Discontinued ASA due to thrombocytopenia < 50. May consider ASA for stroke prevention if PLT constantly > 50.  Recommend addition of topamax for migraine prevension, 50 mg bid added  Ongoing aggressive stroke risk factor management  Therapy recommendations:  No OT  Disposition:  Return home  [place] for discharge from stroke standpoint]  Follow up with carolyn Hassell Done NP at [facility] for HA management. 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 Almost weaned off per pt - on 1/2 pill qod. Recommend it be weaned off.  Fibromyalgia  Depression  Hx parotid gland tumor, removed  Hx solitary pulmonary nodule  Hospital day # 1  Neurology will sign off. Please call with questions. Pt will follow up with Cecille Rubin NP at [facility] in about 2 months. Thanks for the consult.  Rosalin Hawking, MD PhD Stroke Neurology 06/20/2016 5:13 PM   To contact Stroke Continuity provider, please refer to [email or web address] After hours, contact General Neurology

## 2016-06-20 NOTE — Progress Notes (Addendum)
Pt discharged home with family, by car, assessment stable, discharge instructions reviewed, prescriptions discussed and faxed to pharmacy, all questions answered. IV removed. Tele discontinued. Pt taken by wheelchair to exit. Time of discharge: 1821

## 2016-06-20 NOTE — Discharge Instructions (Signed)
Stroke Prevention Some medical conditions and behaviors are associated with an increased chance of having a stroke. You may prevent a stroke by making healthy choices and managing medical conditions. HOW CAN I REDUCE MY RISK OF HAVING A STROKE?   Stay physically active. Get at least 30 minutes of activity on most or all days.  Do not smoke. It may also be helpful to avoid exposure to secondhand smoke.  Limit alcohol use. Moderate alcohol use is considered to be:  No more than 2 drinks per day for men.  No more than 1 drink per day for nonpregnant women.  Eat healthy foods. This involves:  Eating 5 or more servings of fruits and vegetables a day.  Making dietary changes that address high blood pressure (hypertension), high cholesterol, diabetes, or obesity.  Manage your cholesterol levels.  Making food choices that are high in fiber and low in saturated fat, trans fat, and cholesterol may control cholesterol levels.  Take any prescribed medicines to control cholesterol as directed by your health care provider.  Manage your diabetes.  Controlling your carbohydrate and sugar intake is recommended to manage diabetes.  Take any prescribed medicines to control diabetes as directed by your health care provider.  Control your hypertension.  Making food choices that are low in salt (sodium), saturated fat, trans fat, and cholesterol is recommended to manage hypertension.  Ask your health care provider if you need treatment to lower your blood pressure. Take any prescribed medicines to control hypertension as directed by your health care provider.  If you are 75-81 years of age, have your blood pressure checked every 3-5 years. If you are 35 years of age or older, have your blood pressure checked every year.  Maintain a healthy weight.  Reducing calorie intake and making food choices that are low in sodium, saturated fat, trans fat, and cholesterol are recommended to manage  weight.  Stop drug abuse.  Avoid taking birth control pills.  Talk to your health care provider about the risks of taking birth control pills if you are over 67 years old, smoke, get migraines, or have ever had a blood clot.  Get evaluated for sleep disorders (sleep apnea).  Talk to your health care provider about getting a sleep evaluation if you snore a lot or have excessive sleepiness.  Take medicines only as directed by your health care provider.  For some people, aspirin or blood thinners (anticoagulants) are helpful in reducing the risk of forming abnormal blood clots that can lead to stroke. If you have the irregular heart rhythm of atrial fibrillation, you should be on a blood thinner unless there is a good reason you cannot take them.  Understand all your medicine instructions.  Make sure that other conditions (such as anemia or atherosclerosis) are addressed. SEEK IMMEDIATE MEDICAL CARE IF:   You have sudden weakness or numbness of the face, arm, or leg, especially on one side of the body.  Your face or eyelid droops to one side.  You have sudden confusion.  You have trouble speaking (aphasia) or understanding.  You have sudden trouble seeing in one or both eyes.  You have sudden trouble walking.  You have dizziness.  You have a loss of balance or coordination.  You have a sudden, severe headache with no known cause.  You have new chest pain or an irregular heartbeat. Any of these symptoms may represent a serious problem that is an emergency. Do not wait to see if the symptoms will  go away. Get medical help at once. Call your local emergency services (911 in [place] Do not drive yourself to the hospital.   This information is not intended to replace advice given to you by your health care provider. Make sure you discuss any questions you have with your health care provider.   Document Released: 12/27/2004 Document Revised: 12/10/2014 Document Reviewed:  05/22/2013 Elsevier Interactive Patient Education 2016 Elsevier Inc.  Transient Ischemic Attack A transient ischemic attack (TIA) is a "warning stroke" that causes stroke-like symptoms. Unlike a stroke, a TIA does not cause permanent damage to the brain. The symptoms of a TIA can happen very fast and do not last long. It is important to know the symptoms of a TIA and what to do. This can help prevent a major stroke or death. CAUSES  A TIA is caused by a temporary blockage in an artery in the brain or neck (carotid artery). The blockage does not allow the brain to get the blood supply it needs and can cause different symptoms. The blockage can be caused by either:  A blood clot.  Fatty buildup (plaque) in a neck or brain artery. RISK FACTORS  High blood pressure (hypertension).  High cholesterol.  Diabetes mellitus.  Heart disease.  The buildup of plaque in the blood vessels (peripheral artery disease or atherosclerosis).  The buildup of plaque in the blood vessels that provide blood and oxygen to the brain (carotid artery stenosis).  An abnormal heart rhythm (atrial fibrillation).  Obesity.  Using any tobacco products, including cigarettes, chewing tobacco, or electronic cigarettes.  Taking oral contraceptives, especially in combination with using tobacco.  Physical inactivity.  A diet high in fats, salt (sodium), and calories.  Excessive alcohol use.  Use of illegal drugs (especially cocaine and methamphetamine).  Being female.  Being African American.  Being over the age of 28 years.  Family history of stroke.  Previous history of blood clots, stroke, TIA, or heart attack.  Sickle cell disease. SIGNS AND SYMPTOMS  TIA symptoms are the same as a stroke but are temporary. These symptoms usually develop suddenly, or may be newly present upon waking from sleep:  Sudden weakness or numbness of the face, arm, or leg, especially on one side of the body.  Sudden  trouble walking or difficulty moving arms or legs.  Sudden confusion.  Sudden personality changes.  Trouble speaking (aphasia) or understanding.  Difficulty swallowing.  Sudden trouble seeing in one or both eyes.  Double vision.  Dizziness.  Loss of balance or coordination.  Sudden severe headache with no known cause.  Trouble reading or writing.  Loss of bowel or bladder control.  Loss of consciousness. DIAGNOSIS  Your health care provider may be able to determine the presence or absence of a TIA based on your symptoms, history, and physical exam. CT scan of the brain is usually performed to help identify a TIA. Other tests may include:  Electrocardiography (ECG).  Continuous heart monitoring.  Echocardiography.  Carotid ultrasonography.  MRI.  A scan of the brain circulation.  Blood tests. TREATMENT  Since the symptoms of TIA are the same as a stroke, it is important to seek treatment as soon as possible. You may need a medicine to dissolve a blood clot (thrombolytic) if that is the cause of the TIA. This medicine cannot be given if too much time has passed. Treatment may also include:   Rest, oxygen, fluids through an IV tube, and medicines to thin the blood (anticoagulants).  Measures will be taken to prevent short-term and long-term complications, including infection from breathing foreign material into the lungs (aspiration pneumonia), blood clots in the legs, and falls.  Procedures to either remove plaque in the carotid arteries or dilate carotid arteries that have narrowed due to plaque. Those procedures are:  Carotid endarterectomy.  Carotid angioplasty and stenting.  Medicines and diet may be used to address diabetes, high blood pressure, and other underlying risk factors. HOME CARE INSTRUCTIONS   Take medicines only as directed by your health care provider. Follow the directions carefully. Medicines may be used to control risk factors for a stroke.  Be sure you understand all your medicine instructions.  You may be told to take aspirin or the anticoagulant warfarin. Warfarin needs to be taken exactly as instructed.  Taking too much or too little warfarin is dangerous. Too much warfarin increases the risk of bleeding. Too little warfarin continues to allow the risk for blood clots. While taking warfarin, you will need to have regular blood tests to measure your blood clotting time. A PT blood test measures how long it takes for blood to clot. Your PT is used to calculate another value called an INR. Your PT and INR help your health care provider to adjust your dose of warfarin. The dose can change for many reasons. It is critically important that you take warfarin exactly as prescribed.  Many foods, especially foods high in vitamin K can interfere with warfarin and affect the PT and INR. Foods high in vitamin K include spinach, kale, broccoli, cabbage, collard and turnip greens, Brussels sprouts, peas, cauliflower, seaweed, and parsley, as well as beef and pork liver, green tea, and soybean oil. You should eat a consistent amount of foods high in vitamin K. Avoid major changes in your diet, or notify your health care provider before changing your diet. Arrange a visit with a dietitian to answer your questions.  Many medicines can interfere with warfarin and affect the PT and INR. You must tell your health care provider about any and all medicines you take; this includes all vitamins and supplements. Be especially cautious with aspirin and anti-inflammatory medicines. Do not take or discontinue any prescribed or over-the-counter medicine except on the advice of your health care provider or pharmacist.  Warfarin can have side effects, such as excessive bruising or bleeding. You will need to hold pressure over cuts for longer than usual. Your health care provider or pharmacist will discuss other potential side effects.  Avoid sports or activities that  may cause injury or bleeding.  Be careful when shaving, flossing your teeth, or handling sharp objects.  Alcohol can change the body's ability to handle warfarin. It is best to avoid alcoholic drinks or consume only very small amounts while taking warfarin. Notify your health care provider if you change your alcohol intake.  Notify your dentist or other health care providers before procedures.  Eat a diet that includes 5 or more servings of fruits and vegetables each day. This may reduce the risk of stroke. Certain diets may be prescribed to address high blood pressure, high cholesterol, diabetes, or obesity.  A diet low in sodium, saturated fat, trans fat, and cholesterol is recommended to manage high blood pressure.  A diet low in saturated fat, trans fat, and cholesterol, and high in fiber may control cholesterol levels.  A controlled-carbohydrate, controlled-sugar diet is recommended to manage diabetes.  A reduced-calorie diet that is low in sodium, saturated fat, trans fat, and cholesterol  is recommended to manage obesity.  Maintain a healthy weight.  Stay physically active. It is recommended that you get at least 30 minutes of activity on most or all days.  Do not use any tobacco products, including cigarettes, chewing tobacco, or electronic cigarettes. If you need help quitting, ask your health care provider.  Limit alcohol intake to no more than 1 drink per day for nonpregnant women and 2 drinks per day for men. One drink equals 12 ounces of beer, 5 ounces of wine, or 1 ounces of hard liquor.  Do not abuse drugs.  A safe home environment is important to reduce the risk of falls. Your health care provider may arrange for specialists to evaluate your home. Having grab bars in the bedroom and bathroom is often important. Your health care provider may arrange for equipment to be used at home, such as raised toilets and a seat for the shower.  Follow all instructions for follow-up  with your health care provider. This is very important. This includes any referrals and lab tests. Proper follow-up can prevent a stroke or another TIA from occurring. PREVENTION  The risk of a TIA can be decreased by appropriately treating high blood pressure, high cholesterol, diabetes, heart disease, and obesity, and by quitting smoking, limiting alcohol, and staying physically active. SEEK MEDICAL CARE IF:  You have personality changes.  You have difficulty swallowing.  You are seeing double.  You have dizziness.  You have a fever. SEEK IMMEDIATE MEDICAL CARE IF:  Any of the following symptoms may represent a serious problem that is an emergency. Do not wait to see if the symptoms will go away. Get medical help right away. Call your local emergency services (911 in [place] Do not drive yourself to the hospital.  You have sudden weakness or numbness of the face, arm, or leg, especially on one side of the body.  You have sudden trouble walking or difficulty moving arms or legs.  You have sudden confusion.  You have trouble speaking (aphasia) or understanding.  You have sudden trouble seeing in one or both eyes.  You have a loss of balance or coordination.  You have a sudden, severe headache with no known cause.  You have new chest pain or an irregular heartbeat.  You have a partial or total loss of consciousness. MAKE SURE YOU:   Understand these instructions.  Will watch your condition.  Will get help right away if you are not doing well or get worse.   This information is not intended to replace advice given to you by your health care provider. Make sure you discuss any questions you have with your health care provider.   Document Released: 08/29/2005 Document Revised: 12/10/2014 Document Reviewed: 02/24/2014 Elsevier Interactive Patient Education [employer].

## 2016-06-20 NOTE — Care Management Obs Status (Addendum)
[facility] NOTIFICATION   Patient Details  Name: KENTRICE BRETON MRN: [id number] Date of Birth: 07-05-1956   Medicare Observation Status Notification Given:  No pt has [employer]. Does not have Medicare    Shavana Calder, Antony Haste, RN 06/20/2016, 1:45 PM

## 2016-06-20 NOTE — Progress Notes (Signed)
  Echocardiogram 2D Echocardiogram with Definity has been performed.  Darlina Sicilian M 06/20/2016, 12:04 PM

## 2016-06-21 ENCOUNTER — Telehealth: Payer: Self-pay | Admitting: Family Medicine

## 2016-06-21 NOTE — Telephone Encounter (Signed)
Spoke to the patient and she needs a CBC ordered.   Scheduler has scheduled lab appt. Already for 07/02/16. I can put order in just need a diagnosis for a CBC

## 2016-06-21 NOTE — Telephone Encounter (Signed)
OK does she need me to order labs now or is she set

## 2016-06-21 NOTE — Telephone Encounter (Signed)
Pt called in because she need to hospital f/u. Pt says that she was started on a medication and need to fu in 2 weeks. PCP doesn't have any available appt's. Scheduled with PA, pt says that she need to have a CBC completed before her visit. ALSO scheduled labs for pt the day before.

## 2016-06-21 NOTE — Telephone Encounter (Signed)
Order CBC for thrombocytopenia

## 2016-06-22 ENCOUNTER — Other Ambulatory Visit: Payer: Self-pay | Admitting: Family Medicine

## 2016-06-22 DIAGNOSIS — D696 Thrombocytopenia, unspecified: Secondary | ICD-10-CM

## 2016-06-22 NOTE — Telephone Encounter (Signed)
Lab entered

## 2016-06-27 ENCOUNTER — Other Ambulatory Visit: Payer: Self-pay | Admitting: Family Medicine

## 2016-06-27 MED FILL — traZODone HCL 50 MG TABS: 50 | 90 days supply | Qty: 135 | Fill #0

## 2016-06-27 MED FILL — ALPRAZolam 0.25 MG TABS: 0.25 | 10 days supply | Qty: 20 | Fill #2

## 2016-06-27 MED FILL — CLORAZEPATE 7.5 MG TABLET: 7.5 | 90 days supply | Qty: 90 | Fill #0

## 2016-06-27 MED FILL — FLUoxetine HCL 20 MG CAPS: 20 | 30 days supply | Qty: 60 | Fill #1

## 2016-06-27 NOTE — Telephone Encounter (Signed)
Dr Lorelei Pont, please advise tranxene request in Dr Frederik Pear absence.

## 2016-07-02 ENCOUNTER — Other Ambulatory Visit (INDEPENDENT_AMBULATORY_CARE_PROVIDER_SITE_OTHER): Payer: BLUE CROSS/BLUE SHIELD

## 2016-07-02 DIAGNOSIS — D696 Thrombocytopenia, unspecified: Secondary | ICD-10-CM | POA: Diagnosis not present

## 2016-07-02 LAB — CBC WITH DIFFERENTIAL/PLATELET
BASOS PCT: 1.4 % (ref 0.0–3.0)
Basophils Absolute: 0.1 10*3/uL (ref 0.0–0.1)
EOS PCT: 2.2 % (ref 0.0–5.0)
Eosinophils Absolute: 0.2 10*3/uL (ref 0.0–0.7)
HEMATOCRIT: 38.1 % (ref 36.0–46.0)
HEMOGLOBIN: 12.7 g/dL (ref 12.0–15.0)
LYMPHS PCT: 48.5 % — AB (ref 12.0–46.0)
Lymphs Abs: 3.6 10*3/uL (ref 0.7–4.0)
MCHC: 33.3 g/dL (ref 30.0–36.0)
MCV: 93.4 fl (ref 78.0–100.0)
MONO ABS: 0.9 10*3/uL (ref 0.1–1.0)
MONOS PCT: 12.5 % — AB (ref 3.0–12.0)
Neutro Abs: 2.6 10*3/uL (ref 1.4–7.7)
Neutrophils Relative %: 35.4 % — ABNORMAL LOW (ref 43.0–77.0)
Platelets: 68 10*3/uL — ABNORMAL LOW (ref 150.0–400.0)
RBC: 4.08 Mil/uL (ref 3.87–5.11)
RDW: 13.3 % (ref 11.5–15.5)
WBC: 7.4 10*3/uL (ref 4.0–10.5)

## 2016-07-02 NOTE — Progress Notes (Signed)
Patient presents to clinic today for hospital follow-up. Reviewed EMR - discharge summary not complete to review for visit today. Will summarize hospital course below. For full details, please refer to notes from hospitalization. Patient admitted to [facility] 06/19/16 after presenting to ER with stroke-like symptoms. CT Head in ER negative. Neurology consulted and patient subsequently admitted to hospital for further evaluation. Labs including A1C, CMP, UA, Troponin and lipids unremarkable. CXR andn CTA obtained negative for intracranial abnormality. CTA did pick up stable left parotid soft tissue nodule, stable from CT in March 2017. ENT follow-up recommended. MRI Brain was obtained and negative for sign of stroke or cerebral vascular disease. Neurology felt symptoms were more indicative of complex and ocular migraines than a TIA. Patient was started on Topamax 50 mg BID at discharge for migraine prevention. Neurology follow-up was scheduled for September.   Since discharge patient endorses doing well overall. Denies any residual numbness, tingling, facial symptoms or weakness. Denies new symptoms. Is having ocular migraines a couple of times per week. Patient endorses taking Topamax as directed but caused dizziness, fatigue and nausea. Would like to discuss other prevention options. Patient would like to discuss earlier Neurology follow-up. Endorses followed by ENT for the parotid mass noted on CT. Has assessment by ENT after first note of mass on CT from 3/17. Was told by specialist no further workup noted. Patient did come in yesterday for a repeat CBC ordered by PCP, due to low platelet count in hospital. Patient with ITP, followed by Hematology. Platelets increased from 47,000 to 68,000.  Patient does note some mild left ear pressure with occasional pain. Denies change in hearing or drainage from ear. Denies URI symptoms. Denies fever, chills, malaise or fatigue.   Past Medical History:    Diagnosis Date  . Advanced care planning/counseling discussion 09/01/2014  . Allergic state 04/17/2013  . Anxiety   . Clotting disorder ([place])   . Depression 03-04-12   tx. meds  . Depression with anxiety 04/17/2013  . ETOH abuse   . Fibromyalgia 03-04-12   Sporadic pain  . GERD (gastroesophageal reflux disease) 08/17/2013  . History of transfusion of platelets X 1   "related to ITP"  . Hyperlipidemia, mixed 10/16/2015  . ITP (idiopathic thrombocytopenic purpura) 03-04-12   Dx.  '95- blow normal levels, but stable.  . Lipoma 03-04-12   Rt. back about scapular ares  . Migraine    "that's what the dr thinks I had yesterday; still unsure; never had one before" (06/19/2016)  . Overweight 03/06/2015  . PONV (postoperative nausea and vomiting)   . Preventative health care 04/17/2013; 08/17/2013  . Solitary pulmonary nodule 08/17/2013   LLL seen on CT in March 2014    Current Outpatient Prescriptions on File Prior to Visit  Medication Sig Dispense Refill  . acetaminophen (TYLENOL) 500 MG tablet Take 1,000 mg by mouth every 6 (six) hours as needed for pain.    Marland Kitchen ALPRAZolam (XANAX) 0.25 MG tablet TAKE 1 TABLET BY MOUTH TWICE DAILY AS NEEDED FOR SLEEP OR ANXIETY 20 tablet 2  . Calcium Carb-Cholecalciferol (CALCIUM 600 + D PO) Take 1 tablet by mouth daily.    . Cholecalciferol (VITAMIN D) 2000 units CAPS Take 2,000 Units by mouth daily.    . clorazepate (TRANXENE) 7.5 MG tablet TAKE 1 TABLET BY MOUTH AT BEDTIME 90 tablet 0  . docusate sodium (COLACE) 100 MG capsule Take 100 mg by mouth daily.    Marland Kitchen FLUoxetine (PROZAC) 20 MG capsule  TAKE 2 CAPSULES BY MOUTH DAILY 60 capsule 5  . fluticasone (FLONASE) 50 MCG/ACT nasal spray Place 2 sprays into both nostrils daily. 16 g 6  . meclizine (ANTIVERT) 25 MG tablet TAKE 1 TABLET (25 MG) BY MOUTH 3 TIMES A DAY AS NEEDED FOR DIZZINESS OR NAUSEA. 30 tablet 0  . Multiple Vitamins-Minerals (HAIR/SKIN/NAILS PO) Take 3 tablets by mouth daily.    . ranitidine (ZANTAC) 150  MG tablet Take 150 mg by mouth at bedtime.    . traZODone (DESYREL) 50 MG tablet TAKE 1 AND 1/2 TABLETS (75 MG TOTAL) BY MOUTH AT BEDTIME. 135 tablet 1   No current facility-administered medications on file prior to visit.     Allergies  Allergen Reactions  . Codeine Nausea And Vomiting    Family History  Problem Relation Age of Onset  . Heart disease Maternal Grandmother   . Heart disease Maternal Grandfather   . Heart attack Maternal Grandfather 62  . Heart disease Paternal Grandmother   . Heart attack Paternal Grandmother 86  . Heart disease Paternal Grandfather   . Cancer Paternal Grandfather     chest- smoker  . Diabetes Mother     type 2- controlled by diet  . Atrial fibrillation Father   . Hyperlipidemia Sister   . Depression Sister     Social History   Social History  . Marital status: Single    Spouse name: N/A  . Number of children: N/A  . Years of education: N/A   Social History Main Topics  . Smoking status: Former Smoker    Packs/day: 0.75    Years: 5.00    Types: Cigarettes    Quit date: 02/21/1983  . Smokeless tobacco: Never Used  . Alcohol use 12.6 oz/week    21 Cans of beer per week  . Drug use: No  . 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 None   Social History Narrative   Works at [employer] - See HPI.  All other ROS are negative.  BP 110/78 (BP Location: Right Arm, Patient Position: Sitting, Cuff Size: Normal)   Pulse 64   Temp 97.5 F (36.4 C) (Oral)   Resp 16   Ht '5\' 8"'$  (1.727 m)   Wt 183 lb 8 oz (83.2 kg)   SpO2 99%   BMI 27.90 kg/m   Physical Exam  Constitutional: She is oriented to person, place, and time and well-developed, well-nourished, and in no distress.  HENT:  Head: Normocephalic and atraumatic.  Right Ear: Tympanic membrane normal.  Left Ear: A middle ear effusion is  present.  Nose: Nose normal.  Mouth/Throat: Uvula is midline and oropharynx is clear and moist.  Eyes: Conjunctivae are normal.  Neck: Neck supple.  Cardiovascular: Normal rate, regular rhythm, normal heart sounds and intact distal pulses.   Pulmonary/Chest: Effort normal and breath sounds normal. No respiratory distress. She has no wheezes. She has no rales. She exhibits no tenderness.  Neurological: She is alert and oriented to person, place, and time. She has normal strength. She displays facial symmetry and normal speech. No cranial nerve deficit. Coordination and gait normal.  Skin: Skin is warm and dry. No rash noted.  Psychiatric: Affect normal.  Vitals reviewed.   Recent Results (from the past 2160 hour(s))  Protime-INR     Status: None  Collection Time: 06/19/16 12:08 PM  Result Value Ref Range   Prothrombin Time 13.6 11.6 - 15.2 seconds   INR 1.02 0.00 - 1.49  APTT     Status: None   Collection Time: 06/19/16 12:08 PM  Result Value Ref Range   aPTT 30 24 - 37 seconds  CBC     Status: Abnormal   Collection Time: 06/19/16 12:08 PM  Result Value Ref Range   WBC 6.3 4.0 - 10.5 K/uL   RBC 4.13 3.87 - 5.11 MIL/uL   Hemoglobin 12.6 12.0 - 15.0 g/dL   HCT 38.9 36.0 - 46.0 %   MCV 94.2 78.0 - 100.0 fL   MCH 30.5 26.0 - 34.0 pg   MCHC 32.4 30.0 - 36.0 g/dL   RDW 13.2 11.5 - 15.5 %   Platelets 47 (L) 150 - 400 K/uL    Comment: PLATELET COUNT CONFIRMED BY SMEAR  Differential     Status: None   Collection Time: 06/19/16 12:08 PM  Result Value Ref Range   Neutrophils Relative % 48 %   Neutro Abs 3.1 1.7 - 7.7 K/uL   Lymphocytes Relative 42 %   Lymphs Abs 2.6 0.7 - 4.0 K/uL   Monocytes Relative 7 %   Monocytes Absolute 0.5 0.1 - 1.0 K/uL   Eosinophils Relative 1 %   Eosinophils Absolute 0.1 0.0 - 0.7 K/uL   Basophils Relative 1 %   Basophils Absolute 0.1 0.0 - 0.1 K/uL  Comprehensive metabolic panel     Status: Abnormal   Collection Time: 06/19/16 12:08 PM  Result Value  Ref Range   Sodium 137 135 - 145 mmol/L   Potassium 4.3 3.5 - 5.1 mmol/L   Chloride 104 101 - 111 mmol/L   CO2 26 22 - 32 mmol/L   Glucose, Bld 107 (H) 65 - 99 mg/dL   BUN 14 6 - 20 mg/dL   Creatinine, Ser 0.90 0.44 - 1.00 mg/dL   Calcium 9.4 8.9 - 10.3 mg/dL   Total Protein 8.1 6.5 - 8.1 g/dL   Albumin 4.0 3.5 - 5.0 g/dL   AST 23 15 - 41 U/L   ALT 16 14 - 54 U/L   Alkaline Phosphatase 81 38 - 126 U/L   Total Bilirubin 0.5 0.3 - 1.2 mg/dL   GFR calc non Af Amer >60 >60 mL/min   GFR calc Af Amer >60 >60 mL/min    Comment: (NOTE) The eGFR has been calculated using the CKD EPI equation. This calculation has not been validated in all clinical situations. eGFR's persistently <60 mL/min signify possible Chronic Kidney Disease.    Anion gap 7 5 - 15  I-stat troponin, ED     Status: None   Collection Time: 06/19/16 12:20 PM  Result Value Ref Range   Troponin i, poc 0.00 0.00 - 0.08 ng/mL   Comment 3            Comment: Due to the release kinetics of cTnI, a negative result within the first hours of the onset of symptoms does not rule out myocardial infarction with certainty. If myocardial infarction is still suspected, repeat the test at appropriate intervals.   I-Stat Chem 8, ED     Status: Abnormal   Collection Time: 06/19/16 12:30 PM  Result Value Ref Range   Sodium 140 135 - 145 mmol/L   Potassium 4.3 3.5 - 5.1 mmol/L   Chloride 100 (L) 101 - 111 mmol/L   BUN 18 6 - 20 mg/dL  Creatinine, Ser 0.80 0.44 - 1.00 mg/dL   Glucose, Bld 102 (H) 65 - 99 mg/dL   Calcium, Ion 1.13 1.13 - 1.30 mmol/L   TCO2 29 0 - 100 mmol/L   Hemoglobin 14.3 12.0 - 15.0 g/dL   HCT 42.0 36.0 - 46.0 %  Urinalysis, Routine w reflex microscopic (not at [facility])     Status: Abnormal   Collection Time: 06/19/16 12:57 PM  Result Value Ref Range   Color, Urine YELLOW YELLOW   APPearance CLEAR CLEAR   Specific Gravity, Urine 1.012 1.005 - 1.030   pH 5.5 5.0 - 8.0   Glucose, UA NEGATIVE NEGATIVE mg/dL    Hgb urine dipstick TRACE (A) NEGATIVE   Bilirubin Urine NEGATIVE NEGATIVE   Ketones, ur NEGATIVE NEGATIVE mg/dL   Protein, ur NEGATIVE NEGATIVE mg/dL   Nitrite NEGATIVE NEGATIVE   Leukocytes, UA NEGATIVE NEGATIVE  Urine microscopic-add on     Status: Abnormal   Collection Time: 06/19/16 12:57 PM  Result Value Ref Range   Squamous Epithelial / LPF 0-5 (A) NONE SEEN   WBC, UA 0-5 0 - 5 WBC/hpf   RBC / HPF 0-5 0 - 5 RBC/hpf   Bacteria, UA FEW (A) NONE SEEN  Troponin I     Status: None   Collection Time: 06/19/16  4:06 PM  Result Value Ref Range   Troponin I <0.03 <0.03 ng/mL  Hemoglobin A1c     Status: None   Collection Time: 06/19/16  4:06 PM  Result Value Ref Range   Hgb A1c MFr Bld 4.8 4.8 - 5.6 %    Comment: (NOTE)         Pre-diabetes: 5.7 - 6.4         Diabetes: >6.4         Glycemic control for adults with diabetes: <7.0    Mean Plasma Glucose 91 mg/dL    Comment: (NOTE) Performed At: [facility] [place], [place] [id number] Lindon Romp MD OZ:[id number]   Lipid panel     Status: Abnormal   Collection Time: 06/19/16  4:06 PM  Result Value Ref Range   Cholesterol 201 (H) 0 - 200 mg/dL   Triglycerides 130 <150 mg/dL   HDL 66 >40 mg/dL   Total CHOL/HDL Ratio 3.0 RATIO   VLDL 26 0 - 40 mg/dL   LDL Cholesterol 109 (H) 0 - 99 mg/dL    Comment:        Total Cholesterol/HDL:CHD Risk Coronary Heart Disease Risk Table                     Men   Women  1/2 Average Risk   3.4   3.3  Average Risk       5.0   4.4  2 X Average Risk   9.6   7.1  3 X Average Risk  23.4   11.0        Use the calculated Patient Ratio above and the CHD Risk Table to determine the patient's CHD Risk.        ATP III CLASSIFICATION (LDL):  <100     mg/dL   Optimal  100-129  mg/dL   Near or Above                    Optimal  130-159  mg/dL   Borderline  160-189  mg/dL   High  >190     mg/dL   Very High  Troponin I     Status: None   Collection Time: 06/20/16  3:47  AM  Result Value Ref Range   Troponin I <0.03 <0.03 ng/mL  Urine rapid drug screen (hosp performed)     Status: Abnormal   Collection Time: 06/20/16  8:34 AM  Result Value Ref Range   Opiates NONE DETECTED NONE DETECTED   Cocaine NONE DETECTED NONE DETECTED   Benzodiazepines POSITIVE (A) NONE DETECTED   Amphetamines NONE DETECTED NONE DETECTED   Tetrahydrocannabinol NONE DETECTED NONE DETECTED   Barbiturates NONE DETECTED NONE DETECTED    Comment:        DRUG SCREEN FOR MEDICAL PURPOSES ONLY.  IF CONFIRMATION IS NEEDED FOR ANY PURPOSE, NOTIFY LAB WITHIN 5 DAYS.        LOWEST DETECTABLE LIMITS FOR URINE DRUG SCREEN Drug Class       Cutoff (ng/mL) Amphetamine      1000 Barbiturate      200 Benzodiazepine   825 Tricyclics       053 Opiates          300 Cocaine          300 THC              50   Echocardiogram     Status: None   Collection Time: 06/20/16 12:04 PM  Result Value Ref Range   Weight 2,880 oz   Height 68 in   BP 100/83 mmHg  CBC with Differential/Platelet     Status: Abnormal   Collection Time: 07/02/16  7:03 AM  Result Value Ref Range   WBC 7.4 4.0 - 10.5 K/uL   RBC 4.08 3.87 - 5.11 Mil/uL   Hemoglobin 12.7 12.0 - 15.0 g/dL   HCT 38.1 36.0 - 46.0 %   MCV 93.4 78.0 - 100.0 fl   MCHC 33.3 30.0 - 36.0 g/dL   RDW 13.3 11.5 - 15.5 %   Platelets 68.0 (L) 150.0 - 400.0 K/uL   Neutrophils Relative % 35.4 (L) 43.0 - 77.0 %   Lymphocytes Relative 48.5 (H) 12.0 - 46.0 %   Monocytes Relative 12.5 (H) 3.0 - 12.0 %   Eosinophils Relative 2.2 0.0 - 5.0 %   Basophils Relative 1.4 0.0 - 3.0 %   Neutro Abs 2.6 1.4 - 7.7 K/uL   Lymphs Abs 3.6 0.7 - 4.0 K/uL   Monocytes Absolute 0.9 0.1 - 1.0 K/uL   Eosinophils Absolute 0.2 0.0 - 0.7 K/uL   Basophils Absolute 0.1 0.0 - 0.1 K/uL    Assessment/Plan: Parotid nodule Noted in CTA obtained during hospitalization. Stable compared to CT from 02/2016. Patient has already had ENT assessment. Was endorsed no further workup  needed. Patient to FU with ENT as scheduled. Will defer any further workup to them as long as this remains asymptomatic.  Eustachian tube dysfunction No sign of infection on examination. Patient to begin OTC Flonase -- 2 sprays each nostril daily over the next week. Supportive measures reviewed. FU if symptoms are not resolving.  ITP (idiopathic thrombocytopenic purpura) s/p splenectomy 1995 Platelet count is improving. FU with Hematology as scheduled. Patient to FU with PCP in 2 weeks chronic migraines. Recommend repeat CBC at that time.  Stroke-like symptoms Thankfully imaging negative for sign of stroke. Neurology feels symptoms related to migraines. Will try to facilitate quicker appointment with Neurology -- patient would like to see Dr. Jannifer Franklin if possible.  Neuro examination is within normal limits. Would consider antiplatelet but patent with ITP and variable  platelet counts. Will defer risk stratification to PCP. FU with PCP scheduled 2 weeks. Alarm signs/symptoms reviewed that would prompt ER assessment. Patient voices understanding of this.   Chronic migraine Patient with intolerance to Topamax started. Suspect this is due to such a high starting dose. HR at lower end of normal so do not feel Propranolol or other BB good options. Unfortunately must avoid some of the other options due to history of ITP. Discussed with patient PCP. Joint decision made to attempt low dose of Topamax with titration to therapeutic dose. Will start Topamax 25 mg QPM 3-4 days. Then increase to 25 mg BID if tolerating well. If she notes any side effects she is to stop medication and we will have to reassess. Neurology appt scheduled. FU 2 weeks with PCP.     Leeanne Rio, PA-C

## 2016-07-03 ENCOUNTER — Encounter: Payer: Self-pay | Admitting: Physician Assistant

## 2016-07-03 ENCOUNTER — Ambulatory Visit (INDEPENDENT_AMBULATORY_CARE_PROVIDER_SITE_OTHER): Payer: BLUE CROSS/BLUE SHIELD | Admitting: Physician Assistant

## 2016-07-03 VITALS — BP 110/78 | HR 64 | Temp 97.5°F | Resp 16 | Ht 68.0 in | Wt 183.5 lb

## 2016-07-03 DIAGNOSIS — IMO0002 Reserved for concepts with insufficient information to code with codable children: Secondary | ICD-10-CM | POA: Insufficient documentation

## 2016-07-03 DIAGNOSIS — K118 Other diseases of salivary glands: Secondary | ICD-10-CM

## 2016-07-03 DIAGNOSIS — R2990 Unspecified symptoms and signs involving the nervous system: Secondary | ICD-10-CM | POA: Diagnosis not present

## 2016-07-03 DIAGNOSIS — G43709 Chronic migraine without aura, not intractable, without status migrainosus: Secondary | ICD-10-CM

## 2016-07-03 DIAGNOSIS — D693 Immune thrombocytopenic purpura: Secondary | ICD-10-CM | POA: Diagnosis not present

## 2016-07-03 DIAGNOSIS — D110 Benign neoplasm of parotid gland: Secondary | ICD-10-CM

## 2016-07-03 DIAGNOSIS — H6980 Other specified disorders of Eustachian tube, unspecified ear: Secondary | ICD-10-CM | POA: Insufficient documentation

## 2016-07-03 DIAGNOSIS — H6982 Other specified disorders of Eustachian tube, left ear: Secondary | ICD-10-CM

## 2016-07-03 HISTORY — DX: Benign neoplasm of parotid gland: D11.0

## 2016-07-03 MED ORDER — TOPIRAMATE 25 MG PO TABS: 25.0000 mg | ORAL_TABLET | Freq: Two times a day (BID) | ORAL | 0 refills | Status: DC

## 2016-07-03 MED FILL — TOPIRAMATE 25 MG TABLET: 25 | 30 days supply | Qty: 60 | Fill #0

## 2016-07-03 NOTE — Patient Instructions (Signed)
Please start Flonase over-the-counter -- 2 sprays in each nostril daily for the fluid behind the ears.  Start the new dose of topamax taking 25 mg each evening. After 3-4 days, if tolerating, increase to 1 tablet twice daily. Follow-up with Dr. Charlett Blake in 2 weeks.  I will work on facilitating an earlier appointment with Neurology.  If you notice any recurrence of weakness or numbness, please go to the ER.

## 2016-07-03 NOTE — Assessment & Plan Note (Signed)
Patient with intolerance to Topamax started. Suspect this is due to such a high starting dose. HR at lower end of normal so do not feel Propranolol or other BB good options. Unfortunately must avoid some of the other options due to history of ITP. Discussed with patient PCP. Joint decision made to attempt low dose of Topamax with titration to therapeutic dose. Will start Topamax 25 mg QPM 3-4 days. Then increase to 25 mg BID if tolerating well. If she notes any side effects she is to stop medication and we will have to reassess. Neurology appt scheduled. FU 2 weeks with PCP.

## 2016-07-03 NOTE — Assessment & Plan Note (Signed)
No sign of infection on examination. Patient to begin OTC Flonase -- 2 sprays each nostril daily over the next week. Supportive measures reviewed. FU if symptoms are not resolving.

## 2016-07-03 NOTE — Assessment & Plan Note (Signed)
Thankfully imaging negative for sign of stroke. Neurology feels symptoms related to migraines. Will try to facilitate quicker appointment with Neurology -- patient would like to see Dr. Jannifer Franklin if possible.  Neuro examination is within normal limits. Would consider antiplatelet but patent with ITP and variable platelet counts. Will defer risk stratification to PCP. FU with PCP scheduled 2 weeks. Alarm signs/symptoms reviewed that would prompt ER assessment. Patient voices understanding of this.

## 2016-07-03 NOTE — Assessment & Plan Note (Signed)
Platelet count is improving. FU with Hematology as scheduled. Patient to FU with PCP in 2 weeks chronic migraines. Recommend repeat CBC at that time.

## 2016-07-03 NOTE — Assessment & Plan Note (Signed)
Noted in CTA obtained during hospitalization. Stable compared to CT from 02/2016. Patient has already had ENT assessment. Was endorsed no further workup needed. Patient to FU with ENT as scheduled. Will defer any further workup to them as long as this remains asymptomatic.

## 2016-07-04 ENCOUNTER — Encounter: Payer: Self-pay | Admitting: Physician Assistant

## 2016-07-04 ENCOUNTER — Ambulatory Visit (INDEPENDENT_AMBULATORY_CARE_PROVIDER_SITE_OTHER): Payer: BLUE CROSS/BLUE SHIELD | Admitting: Physician Assistant

## 2016-07-04 VITALS — BP 112/80 | HR 66 | Temp 98.0°F | Ht 68.0 in | Wt 185.0 lb

## 2016-07-04 DIAGNOSIS — H00016 Hordeolum externum left eye, unspecified eyelid: Secondary | ICD-10-CM | POA: Diagnosis not present

## 2016-07-04 MED ORDER — ERYTHROMYCIN 5 MG/GM OP OINT: 1.0000 "application " | TOPICAL_OINTMENT | Freq: Three times a day (TID) | OPHTHALMIC | 0 refills | Status: DC

## 2016-07-04 MED FILL — ERYTHROMYCIN EYE OINTMENT: 5 | 7 days supply | Qty: 4 | Fill #0

## 2016-07-04 NOTE — Progress Notes (Signed)
Pre visit review using our clinic review tool, if applicable. No additional management support is needed unless otherwise documented below in the visit note. 

## 2016-07-04 NOTE — Progress Notes (Addendum)
Patient presents to clinic today c/o swelling of left lower eyelid with a bump noted on lid. Notes some tenderness to the area. Denies drainage from eye. Denies fever, chills, malaise or fatigue.  Past Medical History:  Diagnosis Date  . Advanced care planning/counseling discussion 09/01/2014  . Allergic state 04/17/2013  . Anxiety   . Clotting disorder ([place])   . Depression 03-04-12   tx. meds  . Depression with anxiety 04/17/2013  . ETOH abuse   . Fibromyalgia 03-04-12   Sporadic pain  . GERD (gastroesophageal reflux disease) 08/17/2013  . History of transfusion of platelets X 1   "related to ITP"  . Hyperlipidemia, mixed 10/16/2015  . ITP (idiopathic thrombocytopenic purpura) 03-04-12   Dx.  '95- blow normal levels, but stable.  . Lipoma 03-04-12   Rt. back about scapular ares  . Migraine    "that's what the dr thinks I had yesterday; still unsure; never had one before" (06/19/2016)  . Overweight 03/06/2015  . PONV (postoperative nausea and vomiting)   . Preventative health care 04/17/2013; 08/17/2013  . Solitary pulmonary nodule 08/17/2013   LLL seen on CT in March 2014    Current Outpatient Prescriptions on File Prior to Visit  Medication Sig Dispense Refill  . acetaminophen (TYLENOL) 500 MG tablet Take 1,000 mg by mouth every 6 (six) hours as needed for pain.    Marland Kitchen ALPRAZolam (XANAX) 0.25 MG tablet TAKE 1 TABLET BY MOUTH TWICE DAILY AS NEEDED FOR SLEEP OR ANXIETY 20 tablet 2  . Calcium Carb-Cholecalciferol (CALCIUM 600 + D PO) Take 1 tablet by mouth daily.    . Cholecalciferol (VITAMIN D) 2000 units CAPS Take 2,000 Units by mouth daily.    . clorazepate (TRANXENE) 7.5 MG tablet TAKE 1 TABLET BY MOUTH AT BEDTIME 90 tablet 0  . docusate sodium (COLACE) 100 MG capsule Take 100 mg by mouth daily.    Marland Kitchen FLUoxetine (PROZAC) 20 MG capsule TAKE 2 CAPSULES BY MOUTH DAILY 60 capsule 5  . fluticasone (FLONASE) 50 MCG/ACT nasal spray Place 2 sprays into both nostrils daily. 16 g 6  . meclizine  (ANTIVERT) 25 MG tablet TAKE 1 TABLET (25 MG) BY MOUTH 3 TIMES A DAY AS NEEDED FOR DIZZINESS OR NAUSEA. 30 tablet 0  . Multiple Vitamins-Minerals (HAIR/SKIN/NAILS PO) Take 3 tablets by mouth daily.    . ranitidine (ZANTAC) 150 MG tablet Take 150 mg by mouth at bedtime.    . topiramate (TOPAMAX) 25 MG tablet Take 1 tablet (25 mg total) by mouth 2 (two) times daily. 60 tablet 0  . traZODone (DESYREL) 50 MG tablet TAKE 1 AND 1/2 TABLETS (75 MG TOTAL) BY MOUTH AT BEDTIME. 135 tablet 1   No current facility-administered medications on file prior to visit.     Allergies  Allergen Reactions  . Codeine Nausea And Vomiting    Family History  Problem Relation Age of Onset  . Heart disease Maternal Grandmother   . Heart disease Maternal Grandfather   . Heart attack Maternal Grandfather 62  . Heart disease Paternal Grandmother   . Heart attack Paternal Grandmother 86  . Heart disease Paternal Grandfather   . Cancer Paternal Grandfather     chest- smoker  . Diabetes Mother     type 2- controlled by diet  . Atrial fibrillation Father   . Hyperlipidemia Sister   . Depression Sister     Social History   Social History  . Marital status: Single    Spouse name:  N/A  . Number of children: N/A  . Years of education: N/A   Social History Main Topics  . Smoking status: Former Smoker    Packs/day: 0.75    Years: 5.00    Types: Cigarettes    Quit date: 02/21/1983  . Smokeless tobacco: Never Used  . Alcohol use 12.6 oz/week    21 Cans of beer per week  . Drug use: No  . 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 None   Social History Narrative   Works at [employer] - See HPI.  All other ROS are negative.  BP 112/80 (BP Location: Right Arm, Patient Position: Sitting, Cuff Size: Normal)   Pulse 66   Temp 98 F (36.7 C) (Oral)   Ht '5\' 8"'$  (1.727  m)   Wt 185 lb (83.9 kg)   SpO2 98%   BMI 28.13 kg/m   Physical Exam  Constitutional: She is oriented to person, place, and time and well-developed, well-nourished, and in no distress.  HENT:  Head: Normocephalic and atraumatic.  Eyes: Pupils are equal, round, and reactive to light. Right eye exhibits no discharge, no exudate and no hordeolum. No foreign body present in the right eye. Left eye exhibits hordeolum. Left eye exhibits no discharge and no exudate. No foreign body present in the left eye. Right conjunctiva is not injected. Right conjunctiva has no hemorrhage. Left conjunctiva is not injected. Left conjunctiva has no hemorrhage. No scleral icterus.  Cardiovascular: Normal rate, regular rhythm, normal heart sounds and intact distal pulses.   Pulmonary/Chest: Effort normal. No respiratory distress. She has no wheezes. She has no rales. She exhibits no tenderness.  Neurological: She is alert and oriented to person, place, and time.  Skin: Skin is warm and dry. No rash noted.  Psychiatric: Affect normal.  Vitals reviewed.   Recent Results (from the past 2160 hour(s))  Protime-INR     Status: None   Collection Time: 06/19/16 12:08 PM  Result Value Ref Range   Prothrombin Time 13.6 11.6 - 15.2 seconds   INR 1.02 0.00 - 1.49  APTT     Status: None   Collection Time: 06/19/16 12:08 PM  Result Value Ref Range   aPTT 30 24 - 37 seconds  CBC     Status: Abnormal   Collection Time: 06/19/16 12:08 PM  Result Value Ref Range   WBC 6.3 4.0 - 10.5 K/uL   RBC 4.13 3.87 - 5.11 MIL/uL   Hemoglobin 12.6 12.0 - 15.0 g/dL   HCT 38.9 36.0 - 46.0 %   MCV 94.2 78.0 - 100.0 fL   MCH 30.5 26.0 - 34.0 pg   MCHC 32.4 30.0 - 36.0 g/dL   RDW 13.2 11.5 - 15.5 %   Platelets 47 (L) 150 - 400 K/uL    Comment: PLATELET COUNT CONFIRMED BY SMEAR  Differential     Status: None   Collection Time: 06/19/16 12:08 PM  Result Value Ref Range   Neutrophils Relative % 48 %   Neutro Abs 3.1 1.7 - 7.7 K/uL     Lymphocytes Relative 42 %   Lymphs Abs 2.6 0.7 - 4.0 K/uL   Monocytes Relative 7 %   Monocytes Absolute 0.5 0.1 - 1.0 K/uL   Eosinophils Relative 1 %   Eosinophils Absolute 0.1 0.0 - 0.7 K/uL  Basophils Relative 1 %   Basophils Absolute 0.1 0.0 - 0.1 K/uL  Comprehensive metabolic panel     Status: Abnormal   Collection Time: 06/19/16 12:08 PM  Result Value Ref Range   Sodium 137 135 - 145 mmol/L   Potassium 4.3 3.5 - 5.1 mmol/L   Chloride 104 101 - 111 mmol/L   CO2 26 22 - 32 mmol/L   Glucose, Bld 107 (H) 65 - 99 mg/dL   BUN 14 6 - 20 mg/dL   Creatinine, Ser 0.90 0.44 - 1.00 mg/dL   Calcium 9.4 8.9 - 10.3 mg/dL   Total Protein 8.1 6.5 - 8.1 g/dL   Albumin 4.0 3.5 - 5.0 g/dL   AST 23 15 - 41 U/L   ALT 16 14 - 54 U/L   Alkaline Phosphatase 81 38 - 126 U/L   Total Bilirubin 0.5 0.3 - 1.2 mg/dL   GFR calc non Af Amer >60 >60 mL/min   GFR calc Af Amer >60 >60 mL/min    Comment: (NOTE) The eGFR has been calculated using the CKD EPI equation. This calculation has not been validated in all clinical situations. eGFR's persistently <60 mL/min signify possible Chronic Kidney Disease.    Anion gap 7 5 - 15  I-stat troponin, ED     Status: None   Collection Time: 06/19/16 12:20 PM  Result Value Ref Range   Troponin i, poc 0.00 0.00 - 0.08 ng/mL   Comment 3            Comment: Due to the release kinetics of cTnI, a negative result within the first hours of the onset of symptoms does not rule out myocardial infarction with certainty. If myocardial infarction is still suspected, repeat the test at appropriate intervals.   I-Stat Chem 8, ED     Status: Abnormal   Collection Time: 06/19/16 12:30 PM  Result Value Ref Range   Sodium 140 135 - 145 mmol/L   Potassium 4.3 3.5 - 5.1 mmol/L   Chloride 100 (L) 101 - 111 mmol/L   BUN 18 6 - 20 mg/dL   Creatinine, Ser 0.80 0.44 - 1.00 mg/dL   Glucose, Bld 102 (H) 65 - 99 mg/dL   Calcium, Ion 1.13 1.13 - 1.30 mmol/L   TCO2 29 0 - 100  mmol/L   Hemoglobin 14.3 12.0 - 15.0 g/dL   HCT 42.0 36.0 - 46.0 %  Urinalysis, Routine w reflex microscopic (not at [facility])     Status: Abnormal   Collection Time: 06/19/16 12:57 PM  Result Value Ref Range   Color, Urine YELLOW YELLOW   APPearance CLEAR CLEAR   Specific Gravity, Urine 1.012 1.005 - 1.030   pH 5.5 5.0 - 8.0   Glucose, UA NEGATIVE NEGATIVE mg/dL   Hgb urine dipstick TRACE (A) NEGATIVE   Bilirubin Urine NEGATIVE NEGATIVE   Ketones, ur NEGATIVE NEGATIVE mg/dL   Protein, ur NEGATIVE NEGATIVE mg/dL   Nitrite NEGATIVE NEGATIVE   Leukocytes, UA NEGATIVE NEGATIVE  Urine microscopic-add on     Status: Abnormal   Collection Time: 06/19/16 12:57 PM  Result Value Ref Range   Squamous Epithelial / LPF 0-5 (A) NONE SEEN   WBC, UA 0-5 0 - 5 WBC/hpf   RBC / HPF 0-5 0 - 5 RBC/hpf   Bacteria, UA FEW (A) NONE SEEN  Troponin I     Status: None   Collection Time: 06/19/16  4:06 PM  Result Value Ref Range   Troponin I <0.03 <0.03 ng/mL  Hemoglobin A1c     Status: None   Collection Time: 06/19/16  4:06 PM  Result Value Ref Range   Hgb A1c MFr Bld 4.8 4.8 - 5.6 %    Comment: (NOTE)         Pre-diabetes: 5.7 - 6.4         Diabetes: >6.4         Glycemic control for adults with diabetes: <7.0    Mean Plasma Glucose 91 mg/dL    Comment: (NOTE) Performed At: [facility] [place], [place] [id number] Lindon Romp MD [id number]   Lipid panel     Status: Abnormal   Collection Time: 06/19/16  4:06 PM  Result Value Ref Range   Cholesterol 201 (H) 0 - 200 mg/dL   Triglycerides 130 <150 mg/dL   HDL 66 >40 mg/dL   Total CHOL/HDL Ratio 3.0 RATIO   VLDL 26 0 - 40 mg/dL   LDL Cholesterol 109 (H) 0 - 99 mg/dL    Comment:        Total Cholesterol/HDL:CHD Risk Coronary Heart Disease Risk Table                     Men   Women  1/2 Average Risk   3.4   3.3  Average Risk       5.0   4.4  2 X Average Risk   9.6   7.1  3 X Average Risk  23.4   11.0        Use  the calculated Patient Ratio above and the CHD Risk Table to determine the patient's CHD Risk.        ATP III CLASSIFICATION (LDL):  <100     mg/dL   Optimal  100-129  mg/dL   Near or Above                    Optimal  130-159  mg/dL   Borderline  160-189  mg/dL   High  >190     mg/dL   Very High   Troponin I     Status: None   Collection Time: 06/20/16  3:47 AM  Result Value Ref Range   Troponin I <0.03 <0.03 ng/mL  Urine rapid drug screen (hosp performed)     Status: Abnormal   Collection Time: 06/20/16  8:34 AM  Result Value Ref Range   Opiates NONE DETECTED NONE DETECTED   Cocaine NONE DETECTED NONE DETECTED   Benzodiazepines POSITIVE (A) NONE DETECTED   Amphetamines NONE DETECTED NONE DETECTED   Tetrahydrocannabinol NONE DETECTED NONE DETECTED   Barbiturates NONE DETECTED NONE DETECTED    Comment:        DRUG SCREEN FOR MEDICAL PURPOSES ONLY.  IF CONFIRMATION IS NEEDED FOR ANY PURPOSE, NOTIFY LAB WITHIN 5 DAYS.        LOWEST DETECTABLE LIMITS FOR URINE DRUG SCREEN Drug Class       Cutoff (ng/mL) Amphetamine      1000 Barbiturate      200 Benzodiazepine   962 Tricyclics       229 Opiates          300 Cocaine          300 THC              50   Echocardiogram     Status: None   Collection Time: 06/20/16 12:04 PM  Result Value Ref Range   Weight 2,880 oz  Height 68 in   BP 100/83 mmHg  CBC with Differential/Platelet     Status: Abnormal   Collection Time: 07/02/16  7:03 AM  Result Value Ref Range   WBC 7.4 4.0 - 10.5 K/uL   RBC 4.08 3.87 - 5.11 Mil/uL   Hemoglobin 12.7 12.0 - 15.0 g/dL   HCT 38.1 36.0 - 46.0 %   MCV 93.4 78.0 - 100.0 fl   MCHC 33.3 30.0 - 36.0 g/dL   RDW 13.3 11.5 - 15.5 %   Platelets 68.0 (L) 150.0 - 400.0 K/uL   Neutrophils Relative % 35.4 (L) 43.0 - 77.0 %   Lymphocytes Relative 48.5 (H) 12.0 - 46.0 %   Monocytes Relative 12.5 (H) 3.0 - 12.0 %   Eosinophils Relative 2.2 0.0 - 5.0 %   Basophils Relative 1.4 0.0 - 3.0 %   Neutro Abs  2.6 1.4 - 7.7 K/uL   Lymphs Abs 3.6 0.7 - 4.0 K/uL   Monocytes Absolute 0.9 0.1 - 1.0 K/uL   Eosinophils Absolute 0.2 0.0 - 0.7 K/uL   Basophils Absolute 0.1 0.0 - 0.1 K/uL    Assessment/Plan: 1. Stye external, left With tenderness. Supportive measures discussed. Will apply warm cloth to the area to promote drainage. Rx Romycin to use as directed. FU if not improving over the next 5-7 days.   Leeanne Rio, PA-C

## 2016-07-04 NOTE — Patient Instructions (Signed)
Please use antibiotic as directed to the eye for 5-7 days. Keep warm compress on the eye. Follow-up if symptoms are not resolving or if anything worsens.

## 2016-07-05 ENCOUNTER — Telehealth: Payer: Self-pay | Admitting: Physician Assistant

## 2016-07-05 NOTE — Telephone Encounter (Signed)
Thank you :)

## 2016-07-05 NOTE — Telephone Encounter (Signed)
I got it moved up to 8/17 9:30am with Dr. Jannifer Franklin. I left VM and sent mychart msgs to the pt.

## 2016-07-05 NOTE — Telephone Encounter (Signed)
Hospital placed referral to Neurology for the patient -- TIA versus ocular migraines. She has appointment but not until mid September. Can we call to see if we can expedite appointment for patient since she is a hospital follow-up? She would like to see Dr. Jannifer Franklin if possible. Let me know. Thank you for your assistance.

## 2016-07-19 ENCOUNTER — Ambulatory Visit (INDEPENDENT_AMBULATORY_CARE_PROVIDER_SITE_OTHER): Payer: BLUE CROSS/BLUE SHIELD | Admitting: Neurology

## 2016-07-19 ENCOUNTER — Encounter: Payer: Self-pay | Admitting: Neurology

## 2016-07-19 VITALS — BP 118/70 | HR 60 | Ht 68.0 in | Wt 181.8 lb

## 2016-07-19 DIAGNOSIS — H9203 Otalgia, bilateral: Secondary | ICD-10-CM

## 2016-07-19 DIAGNOSIS — R2990 Unspecified symptoms and signs involving the nervous system: Secondary | ICD-10-CM | POA: Diagnosis not present

## 2016-07-19 NOTE — Progress Notes (Addendum)
Reason for visit: Stroke like event  Referring physician: [facility]  Laura Bender is a 60 y.o. female  History of present illness:  Ms. Quinnell is a 60 year old right-handed white female with a history of migraine equivalent. The patient began 3 years ago to have visual events that have become much more frequent over the last 18 months. The patient indicates that she is getting 3-5 such events a week. The episodes are of 2 types, the first episode is associated with a circular visual distortion that gradually enlarges and include squiggly lines that are bright, the patient had bright distortions in the periphery the vision bilaterally. The episode will last about 30 minutes and then clear without headache. The second type event is a right-sided crescent distortion that gradually darkens but does not enlarge, this will last 30-45 minutes and then disappear, this may be associated with dizziness and disorientation at times. The patient does not get headache with this. The patient had an event on 06/19/2016. The patient was at work, she began getting some clumsiness of the right hand, she noted some numbness of the right face, she felt hot and dizzy and nauseated. The patient tried to go to the bathroom and noted that the right side of the body was not working properly, she had difficulty walking. The episode lasted about 10 minutes and then began to clear. The patient also noted some right-sided visual blurring and difficulty with speech. The patient went to the hospital for an evaluation, she was admitted, she did not get TPA. The patient had MRI evaluation of the brain that did not show an acute stroke, CT angiogram of the head and neck was unremarkable. 2-D echocardiogram was unremarkable. The patient was not placed on antiplatelet agents secondary to a history of ITP. The patient has not had recurrence, she was placed on Topamax, but could not tolerate the 100 mg daily dose, she has been  cut back to 25 mg a day, working up to 50 mg daily. She still has some occasional episodes with difficulty with word finding. Overall, she is doing much better. She comes in for reevaluation.  Past Medical History:  Diagnosis Date  . Advanced care planning/counseling discussion 09/01/2014  . Allergic state 04/17/2013  . Anxiety   . Clotting disorder ([place])   . Depression 03-04-12   tx. meds  . Depression with anxiety 04/17/2013  . ETOH abuse   . Fibromyalgia 03-04-12   Sporadic pain  . GERD (gastroesophageal reflux disease) 08/17/2013  . History of transfusion of platelets X 1   "related to ITP"  . Hyperlipidemia, mixed 10/16/2015  . ITP (idiopathic thrombocytopenic purpura) 03-04-12   Dx.  '95- blow normal levels, but stable.  . Lipoma 03-04-12   Rt. back about scapular ares  . Migraine    "that's what the dr thinks I had yesterday; still unsure; never had one before" (06/19/2016)  . Overweight 03/06/2015  . PONV (postoperative nausea and vomiting)   . Preventative health care 04/17/2013; 08/17/2013  . Solitary pulmonary nodule 08/17/2013   LLL seen on CT in March 2014    Past Surgical History:  Procedure Laterality Date  . APPENDECTOMY  1995  . BREAST BIOPSY Left 2012   needle biopsy(benign)-Titanium needle marker remains  . CHOLECYSTECTOMY N/A 04/24/2013   Procedure: LAPAROSCOPIC CHOLECYSTECTOMY WITH INTRAOPERATIVE CHOLANGIOGRAM;  Surgeon: Haywood Lasso, MD;  Location: [facility];  Service: General;  Laterality: N/A;  . COLONOSCOPY  2008   WNL, Dr Cristina Gong  .  IRRIGATION AND DEBRIDEMENT ABSCESS  ~ 2005 X 2   buttocks  . LIPOMA EXCISION  ~ 04/2016   "base of my neck"  . MASS EXCISION  03/10/2012   Procedure: EXCISION MASS;  Surgeon: Odis Hollingshead, MD;  Location: [facility];  Service: General;  Laterality: N/A;  Removal of back lipoma  . 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 SPLENECTOMY, TOTAL  1995  . TOTAL ABDOMINAL HYSTERECTOMY  1995   total, endometriosis, fibroid,  ovarian atrophy    Family History  Problem Relation Age of Onset  . Heart disease Maternal Grandmother   . Heart disease Maternal Grandfather   . Heart attack Maternal Grandfather 62  . Heart disease Paternal Grandmother   . Heart attack Paternal Grandmother 86  . Heart disease Paternal Grandfather   . Cancer Paternal Grandfather     chest- smoker  . Diabetes Mother     type 2- controlled by diet  . Atrial fibrillation Father   . Hyperlipidemia Sister   . Depression Sister     Social history:  reports that she quit smoking about 33 years ago. Her smoking use included Cigarettes. She has a 3.75 pack-year smoking history. She has never used smokeless tobacco. She reports that she drinks about 7.2 oz of alcohol per week . She reports that she does not use drugs.  Medications:  Prior to Admission medications   Medication Sig Start Date End Date Taking? Authorizing Provider  acetaminophen (TYLENOL) 500 MG tablet Take 1,000 mg by mouth every 6 (six) hours as needed for pain.   Yes Historical Provider, MD  ALPRAZolam (XANAX) 0.25 MG tablet TAKE 1 TABLET BY MOUTH TWICE DAILY AS NEEDED FOR SLEEP OR ANXIETY 03/08/16  Yes [name], MD  Calcium Carb-Cholecalciferol (CALCIUM 600 + D PO) Take 1 tablet by mouth daily.   Yes Historical Provider, MD  Cholecalciferol (VITAMIN D) 2000 units CAPS Take 2,000 Units by mouth daily.   Yes Historical Provider, MD  clorazepate (TRANXENE) 7.5 MG tablet TAKE 1 TABLET BY MOUTH AT BEDTIME 06/27/16  Yes Jessica C Copland, MD  docusate sodium (COLACE) 100 MG capsule Take 100 mg by mouth daily.   Yes Historical Provider, MD  erythromycin ophthalmic ointment Place 1 application into the left eye 3 (three) times daily. 07/04/16  Yes Brunetta Jeans, PA-C  FLUoxetine (PROZAC) 20 MG capsule TAKE 2 CAPSULES BY MOUTH DAILY 05/24/16  Yes [name], MD  fluticasone (FLONASE) 50 MCG/ACT nasal spray Place 2 sprays into both nostrils daily. 02/10/14  Yes Brunetta Jeans,  PA-C  meclizine (ANTIVERT) 25 MG tablet TAKE 1 TABLET (25 MG) BY MOUTH 3 TIMES A DAY AS NEEDED FOR DIZZINESS OR NAUSEA. 05/23/15  Yes [name], MD  Multiple Vitamins-Minerals (HAIR/SKIN/NAILS PO) Take 3 tablets by mouth daily.   Yes Historical Provider, MD  ranitidine (ZANTAC) 150 MG tablet Take 150 mg by mouth at bedtime.   Yes Historical Provider, MD  topiramate (TOPAMAX) 25 MG tablet Take 1 tablet (25 mg total) by mouth 2 (two) times daily. 07/03/16  Yes Brunetta Jeans, PA-C  traZODone (DESYREL) 50 MG tablet TAKE 1 AND 1/2 TABLETS (75 MG TOTAL) BY MOUTH AT BEDTIME. 06/27/16  Yes [name], MD      Allergies  Allergen Reactions  . Codeine Nausea And Vomiting    ROS:  Out of a complete 14 system review of symptoms, the patient complains only of the following symptoms,  and all other reviewed systems are negative.  Fatigue Chronic ear pain bilaterally, left greater than right, ringing in the ears Eye itching, loss of vision Insomnia, frequent waking, daytime sleepiness Joint pain, neck pain, neck stiffness Bruising easily Memory loss, dizziness, numbness, speech difficulty, weakness, tremors Depression, anxiety  Blood pressure 118/70, pulse 60, height 5\' 8"  (1.727 m), weight 181 lb 12 oz (82.4 kg).  Physical Exam  General: The patient is alert and cooperative at the time of the examination.  Eyes: Pupils are equal, round, and reactive to light. Discs are flat bilaterally.  Neck: The neck is supple, no carotid bruits are noted.  Respiratory: The respiratory examination is clear.  Cardiovascular: The cardiovascular examination reveals a regular rate and rhythm, no obvious murmurs or rubs are noted.  Skin: Extremities are without significant edema.  Neurologic Exam  Mental status: The patient is alert and oriented x 3 at the time of the examination. The patient has apparent normal recent and remote memory, with an apparently normal attention span and concentration  ability.  Cranial nerves: Facial symmetry is present. There is good sensation of the face to pinprick and soft touch bilaterally. The strength of the facial muscles and the muscles to head turning and shoulder shrug are normal bilaterally. Speech is well enunciated, no aphasia or dysarthria is noted. Extraocular movements are full. Visual fields are full. The tongue is midline, and the patient has symmetric elevation of the soft palate. No obvious hearing deficits are noted.  Motor: The motor testing reveals 5 over 5 strength of all 4 extremities. Good symmetric motor tone is noted throughout.  Sensory: Sensory testing is intact to pinprick, soft touch, vibration sensation, and position sense on all 4 extremities. No evidence of extinction is noted.  Coordination: Cerebellar testing reveals good finger-nose-finger and heel-to-shin bilaterally.  Gait and station: Gait is normal. Tandem gait is normal. Romberg is negative. No drift is seen.  Reflexes: Deep tendon reflexes are symmetric and normal bilaterally. Toes are downgoing bilaterally.   MRI brain 06/19/16:  IMPRESSION: 1. Normal for age noncontrast MRI appearance of the brain. 2. Left inferior parotid space nodular soft tissue re- demonstrated, see also neck CTA from today.  * MRI scan images were reviewed online. I agree with the written report.    CTA head and neck 06/19/16:  IMPRESSION: 1. Negative CTA head and neck. No atherosclerosis or stenosis in the neck. Minimal right ICA siphon calcified plaque. No intracranial aneurysm or stenosis identified. 2. Continued abnormal soft tissue nodularity along the posterior and inferior left parotid space and inseparable from an overlying skin incision. 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 3. Stable and negative CT appearance of the  brain.   2D echo 06/20/16:  Study Conclusions  - Left ventricle: The cavity size was normal. Wall thickness was   normal. Systolic function was normal. The estimated ejection   fraction was in the range of 60% to 65%. Wall motion was normal;   there were no regional wall motion abnormalities. Doppler   parameters are consistent with abnormal left ventricular   relaxation (grade 1 diastolic dysfunction). The E/e&' ratio is   between 8-15, suggesting indeterminate LV filling pressure. - Mitral valve: Mildly thickened leaflets . There was trivial   regurgitation. - Left atrium: The atrium was normal in size. - Atrial septum: Mobile IAS - cannot  exclude small PFO. - Tricuspid valve: There was mild regurgitation. - Pulmonary arteries: PA peak pressure: 37 mm Hg (S). - Systemic veins: The IVC measures <2.1 cm, but does not collapse   >50%, suggesting an elevated RA pressure of 8 mmHg.  Impressions:  - LVEF 60-65%, normal wall thickness, normal wall motion, diastolic   dysfunction, normal LV filling pressure, trivial MR, normal LA   size, possible small PFO, mild TR, RVSP 37 mmHg, elevated RA   pressure of 8 mmHg estimated.    Assessment/Plan:  1. History of migraine equivalent  2. TIA type event, possibly migrainous  3. History of ITP  The patient had been on Premarin prior to the recent admission. This has been discontinued. She has been on Premarin for quite a number of years. The etiology of the onset of her migraine equivalent is not clear. The patient has been placed on Topamax, we will see how this works for her episodes in the next several weeks, if she continues to have frequent events, she will call our office and we will increase the Topamax dose. In the future, we may go to propranolol or verapamil if the migraine equivalent events continue. She will otherwise follow-up in 3-4 months. The patient reports chronic bilateral ear pain, left greater than right, I will make  referral to Dr. Radene Journey.  Jill Alexanders MD 07/19/2016 9:44 AM  [facility] [address] [place], [place] [address]  Phone [phone] Fax [phone]

## 2016-07-26 DIAGNOSIS — H9202 Otalgia, left ear: Secondary | ICD-10-CM | POA: Diagnosis not present

## 2016-07-31 ENCOUNTER — Ambulatory Visit (INDEPENDENT_AMBULATORY_CARE_PROVIDER_SITE_OTHER): Payer: BLUE CROSS/BLUE SHIELD | Admitting: Family Medicine

## 2016-07-31 ENCOUNTER — Encounter: Payer: Self-pay | Admitting: Family Medicine

## 2016-07-31 DIAGNOSIS — Z23 Encounter for immunization: Secondary | ICD-10-CM

## 2016-07-31 DIAGNOSIS — IMO0002 Reserved for concepts with insufficient information to code with codable children: Secondary | ICD-10-CM

## 2016-07-31 DIAGNOSIS — E663 Overweight: Secondary | ICD-10-CM | POA: Diagnosis not present

## 2016-07-31 DIAGNOSIS — G43709 Chronic migraine without aura, not intractable, without status migrainosus: Secondary | ICD-10-CM | POA: Diagnosis not present

## 2016-07-31 DIAGNOSIS — D693 Immune thrombocytopenic purpura: Secondary | ICD-10-CM | POA: Diagnosis not present

## 2016-07-31 MED ORDER — TOPIRAMATE 25 MG PO TABS: 25.0000 mg | ORAL_TABLET | Freq: Every day | ORAL | 2 refills | Status: DC

## 2016-07-31 NOTE — Patient Instructions (Signed)
Call for annual eye exam Drop Topamax to 1 tab daily at bed, can alter to 1/2 tab twice or once daily as needed as well.   Migraine Headache A migraine headache is an intense, throbbing pain on one or both sides of your head. A migraine can last for 30 minutes to several hours. CAUSES  The exact cause of a migraine headache is not always known. However, a migraine may be caused when nerves in the brain become irritated and release chemicals that cause inflammation. This causes pain. Certain things may also trigger migraines, such as:  Alcohol.  Smoking.  Stress.  Menstruation.  Aged cheeses.  Foods or drinks that contain nitrates, glutamate, aspartame, or tyramine.  Lack of sleep.  Chocolate.  Caffeine.  Hunger.  Physical exertion.  Fatigue.  Medicines used to treat chest pain (nitroglycerine), birth control pills, estrogen, and some blood pressure medicines. SIGNS AND SYMPTOMS  Pain on one or both sides of your head.  Pulsating or throbbing pain.  Severe pain that prevents daily activities.  Pain that is aggravated by any physical activity.  Nausea, vomiting, or both.  Dizziness.  Pain with exposure to bright lights, loud noises, or activity.  General sensitivity to bright lights, loud noises, or smells. Before you get a migraine, you may get warning signs that a migraine is coming (aura). An aura may include:  Seeing flashing lights.  Seeing bright spots, halos, or zigzag lines.  Having tunnel vision or blurred vision.  Having feelings of numbness or tingling.  Having trouble talking.  Having muscle weakness. DIAGNOSIS  A migraine headache is often diagnosed based on:  Symptoms.  Physical exam.  A CT scan or MRI of your head. These imaging tests cannot diagnose migraines, but they can help rule out other causes of headaches. TREATMENT Medicines may be given for pain and nausea. Medicines can also be given to help prevent recurrent migraines.   HOME CARE INSTRUCTIONS  Only take over-the-counter or prescription medicines for pain or discomfort as directed by your health care provider. The use of long-term narcotics is not recommended.  Lie down in a dark, quiet room when you have a migraine.  Keep a journal to find out what may trigger your migraine headaches. For example, write down:  What you eat and drink.  How much sleep you get.  Any change to your diet or medicines.  Limit alcohol consumption.  Quit smoking if you smoke.  Get 7-9 hours of sleep, or as recommended by your health care provider.  Limit stress.  Keep lights dim if bright lights bother you and make your migraines worse. SEEK IMMEDIATE MEDICAL CARE IF:   Your migraine becomes severe.  You have a fever.  You have a stiff neck.  You have vision loss.  You have muscular weakness or loss of muscle control.  You start losing your balance or have trouble walking.  You feel faint or pass out.  You have severe symptoms that are different from your first symptoms. MAKE SURE YOU:   Understand these instructions.  Will watch your condition.  Will get help right away if you are not doing well or get worse.   This information is not intended to replace advice given to you by your health care provider. Make sure you discuss any questions you have with your health care provider.   Document Released: 11/19/2005 Document Revised: 12/10/2014 Document Reviewed: 07/27/2013 Elsevier Interactive Patient Education [employer].

## 2016-07-31 NOTE — Progress Notes (Signed)
Pre visit review using our clinic review tool, if applicable. No additional management support is needed unless otherwise documented below in the visit note. 

## 2016-08-01 MED FILL — FLUoxetine HCL 20 MG CAPS: 20 | 30 days supply | Qty: 60 | Fill #2

## 2016-08-01 MED FILL — TOPIRAMATE 25 MG TABLET: 25 | 30 days supply | Qty: 30 | Fill #0

## 2016-08-12 NOTE — Assessment & Plan Note (Signed)
Encouraged DASH diet, decrease po intake and increase exercise as tolerated. Needs 7-8 hours of sleep nightly. Avoid trans fats, eat small, frequent meals every 4-5 hours with lean proteins, complex carbs and healthy fats. Minimize simple carbs 

## 2016-08-12 NOTE — Assessment & Plan Note (Signed)
Recent episode of neurologic symptoms is now following with neurology for possibility of TIA, no recent episode. No changes today

## 2016-08-12 NOTE — Assessment & Plan Note (Signed)
Stable, following with hematology will continue to monitor

## 2016-08-12 NOTE — Progress Notes (Signed)
[name], female   DOB: 1956-11-07, 60 y.o.   MRN: [id number] [name], female    DOB: 06/22/1956  Age: 60 y.o. MRN: [id number]    Subjective:  Subjective  HPI Laura Bender presents for follow up. She feels well today but was recently experiencing some visual symptoms that were concerning for possible TIA vs neurologic migraine. No recent illness otherwise and she feels well today. She experienced visual symptoms which have resolved. Her peripheral vision was compromised. Denies CP/palp/SOB/HA/congestion/fevers/GI or GU c/o. Taking meds as prescribed  Review of Systems  Constitutional: Negative for fever.  HENT: Negative for congestion.   Respiratory: Negative for shortness of breath.   Cardiovascular: Negative for chest pain, palpitations and leg swelling.  Gastrointestinal: Negative for abdominal pain, blood in stool and nausea.  Genitourinary: Negative for dysuria and frequency.  Skin: Negative for rash.  Allergic/Immunologic: Negative for environmental allergies.  Neurological: Negative for dizziness and headaches.  Psychiatric/Behavioral: The patient is not nervous/anxious.     History Past Medical History:  Diagnosis Date  . Advanced care planning/counseling discussion 09/01/2014  . Allergic state 04/17/2013  . Anxiety   . Clotting disorder ([place])   . Depression 03-04-12   tx. meds  . Depression with anxiety 04/17/2013  . ETOH abuse   . Fibromyalgia 03-04-12   Sporadic pain  . GERD (gastroesophageal reflux disease) 08/17/2013  . History of transfusion of platelets X 1   "related to ITP"  . Hyperlipidemia, mixed 10/16/2015  . ITP (idiopathic thrombocytopenic purpura) 03-04-12   Dx.  '95- blow normal levels, but stable.  . Lipoma 03-04-12   Rt. back about scapular ares  . Migraine    "that's what the dr thinks I had yesterday; still unsure; never had one before" (06/19/2016)  . Overweight 03/06/2015  . PONV (postoperative nausea and  vomiting)   . Preventative health care 04/17/2013; 08/17/2013  . Solitary pulmonary nodule 08/17/2013   LLL seen on CT in March 2014    She has a past surgical history that includes Splenectomy, total (1995); Irrigation and debridement abscess (~ 2005 X 2); Parotid gland tumor excision (Left, ~ 2000); Mass excision (03/10/2012); Colonoscopy (2008); Cholecystectomy (N/A, 04/24/2013); Appendectomy (1995); Total abdominal hysterectomy (1995); Breast biopsy (Left, 2012); and Lipoma excision (~ 04/2016).   Her family history includes Atrial fibrillation in her father; Cancer in her paternal grandfather; Depression in her sister; Diabetes in her mother; Heart attack (age of onset: 32) in her maternal grandfather; Heart attack (age of onset: 17) in her paternal grandmother; Heart disease in her maternal grandfather, maternal grandmother, paternal grandfather, and paternal grandmother; Hyperlipidemia in her sister; Migraines in her sister.She reports that she quit smoking about 33 years ago. Her smoking use included Cigarettes. She has a 3.75 pack-year smoking history. She has never used smokeless tobacco. She reports that she drinks about 7.2 oz of alcohol per week . She reports that she does not use drugs.  Current Outpatient Prescriptions on File Prior to Visit  Medication Sig Dispense Refill  . acetaminophen (TYLENOL) 500 MG tablet Take 1,000 mg by mouth every 6 (six) hours as needed for pain.    Marland Kitchen ALPRAZolam (XANAX) 0.25 MG tablet TAKE 1 TABLET BY MOUTH TWICE DAILY AS NEEDED FOR SLEEP OR ANXIETY 20 tablet 2  . Calcium Carb-Cholecalciferol (CALCIUM 600 + D PO) Take 1 tablet by mouth daily.    . Cholecalciferol (VITAMIN D) 2000 units CAPS Take 2,000 Units by mouth daily.    Marland Kitchen  clorazepate (TRANXENE) 7.5 MG tablet TAKE 1 TABLET BY MOUTH AT BEDTIME 90 tablet 0  . docusate sodium (COLACE) 100 MG capsule Take 100 mg by mouth daily.    Marland Kitchen erythromycin ophthalmic ointment Place 1 application into the left eye 3  (three) times daily. 3.5 g 0  . FLUoxetine (PROZAC) 20 MG capsule TAKE 2 CAPSULES BY MOUTH DAILY 60 capsule 5  . fluticasone (FLONASE) 50 MCG/ACT nasal spray Place 2 sprays into both nostrils daily. 16 g 6  . meclizine (ANTIVERT) 25 MG tablet TAKE 1 TABLET (25 MG) BY MOUTH 3 TIMES A DAY AS NEEDED FOR DIZZINESS OR NAUSEA. 30 tablet 0  . Multiple Vitamins-Minerals (HAIR/SKIN/NAILS PO) Take 3 tablets by mouth daily.    . ranitidine (ZANTAC) 150 MG tablet Take 150 mg by mouth at bedtime.    . traZODone (DESYREL) 50 MG tablet TAKE 1 AND 1/2 TABLETS (75 MG TOTAL) BY MOUTH AT BEDTIME. 135 tablet 1   No current facility-administered medications on file prior to visit.      Objective:  Objective  Physical Exam  Constitutional: She is oriented to person, place, and time. She appears well-developed and well-nourished. No distress.  HENT:  Head: Normocephalic and atraumatic.  Nose: Nose normal.  Eyes: Right eye exhibits no discharge. Left eye exhibits no discharge.  Neck: Normal range of motion. Neck supple.  Cardiovascular: Normal rate and regular rhythm.   No murmur heard. Pulmonary/Chest: Effort normal and breath sounds normal.  Abdominal: Soft. Bowel sounds are normal. There is no tenderness.  Musculoskeletal: She exhibits no edema.  Neurological: She is alert and oriented to person, place, and time.  Skin: Skin is warm and dry.  Psychiatric: She has a normal mood and affect.  Nursing note and vitals reviewed.  BP 110/80 (BP Location: Left Arm, Patient Position: Sitting, Cuff Size: Normal)   Pulse 68   Temp 97.7 F (36.5 C) (Oral)   Ht 5\' 8"  (1.727 m)   Wt 186 lb 6 oz (84.5 kg)   SpO2 99%   BMI 28.34 kg/m  Wt Readings from Last 3 Encounters:  07/31/16 186 lb 6 oz (84.5 kg)  07/19/16 181 lb 12 oz (82.4 kg)  07/04/16 185 lb (83.9 kg)     Lab Results  Component Value Date   WBC 7.4 07/02/2016   HGB 12.7 07/02/2016   HCT 38.1 07/02/2016   PLT 68.0 (L) 07/02/2016   GLUCOSE 102  (H) 06/19/2016   CHOL 201 (H) 06/19/2016   TRIG 130 06/19/2016   HDL 66 06/19/2016   LDLDIRECT 86.0 03/27/2016   LDLCALC 109 (H) 06/19/2016   ALT 16 06/19/2016   AST 23 06/19/2016   NA 140 06/19/2016   K 4.3 06/19/2016   CL 100 (L) 06/19/2016   CREATININE 0.80 06/19/2016   BUN 18 06/19/2016   CO2 26 06/19/2016   TSH 1.23 03/27/2016   INR 1.02 06/19/2016   HGBA1C 4.8 06/19/2016    Ct Angio Head W/cm &/or Wo Cm  Result Date: 06/19/2016 CLINICAL DATA:  60 year old female with episode of dizziness yesterday, TIAs possibly from posterior circulation disease. Initial encounter. Personal history of left parotid mass excision 15 years ago, reportedly benign. EXAM: CT ANGIOGRAPHY HEAD AND NECK TECHNIQUE: Multidetector CT imaging of the head and neck was performed using the standard protocol during bolus administration of intravenous contrast. Multiplanar CT image reconstructions and MIPs were obtained to evaluate the vascular anatomy. Carotid stenosis measurements (when applicable) are obtained utilizing NASCET criteria, using the  distal internal carotid diameter as the denominator. CONTRAST:  50 mL Isovue 370 COMPARISON:  Head CT without contrast 1230 hours today. Neck CT 02/13/2016. FINDINGS: CTA NECK Skeleton:  No acute osseous abnormality identified. Visualized paranasal sinuses and mastoids are stable and well pneumatized. Other neck: Negative lung apices. No superior mediastinal lymphadenopathy. Negative thyroid, larynx, pharynx, parapharyngeal spaces, retropharyngeal space, sublingual space, submandibular glands, and right parotid gland. There is continued abnormal soft tissue nodularity along the inferior left parotid gland. This has a nodular reversed C-shaped configuration (series 9, image 159) and appears stable since March measuring up to 15 mm in thickness. This is inseparable from an overlying skin incision. Regional left level 2 lymph nodes are stable and within normal limits. No cervical  lymphadenopathy or new soft tissue mass. Aortic arch: Borderline bovine type arch configuration. No arch atherosclerosis or great vessel origin stenosis. Right carotid system: Negative. Left carotid system: Negative. Vertebral arteries:No proximal subclavian stenosis. Both vertebral artery origins are normal (the left on series 8, image 122 an the right on image 116). Codominant vertebral arteries are patent and appear normal to the skullbase. CTA HEAD Posterior circulation: Normal distal vertebral arteries. Both PICA origins appear normal. Normal vertebrobasilar junction. No distal vertebral artery or basilar artery stenosis. Normal SCA origins. The PCA origins are somewhat shared from the basilar tip (series 11, image 22). Posterior communicating arteries are diminutive or absent. Bilateral PCA branches are within normal limits. Anterior circulation: Both ICA siphons are patent without stenosis. There is minimal calcified plaque on the right. No siphon aneurysm. Patent carotid termini. Dominant left A1 segment, right A1 is diminutive. Anterior communicating artery and bilateral ACA branches are within normal limits. Left MCA M1 segment, bifurcation, and left MCA branches are normal. Right MCA M1 segment, trifurcation and right MCA branches are normal. Venous sinuses: Patent. Anatomic variants: Dominant left ACA A1 segment. Delayed phase: Stable and normal gray-white matter differentiation. No cortically based acute infarct identified. No abnormal enhancement identified. IMPRESSION: 1. Negative CTA head and neck. No atherosclerosis or stenosis in the neck. Minimal right ICA siphon calcified plaque. No intracranial aneurysm or stenosis identified. 2. Continued abnormal soft tissue nodularity along the posterior and inferior left parotid space and inseparable from an overlying skin incision. 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 3. Stable and negative CT appearance of the brain. Electronically Signed   By: Genevie Ann M.D.   On: 06/19/2016 14:51   Dg Chest 2 View  Result Date: 06/19/2016 CLINICAL DATA:  Right-sided weakness. EXAM: CHEST  2 VIEW COMPARISON:  04/22/2013. FINDINGS: The lungs are clear wiithout focal pneumonia, edema, pneumothorax or pleural effusion. Nodular density/densities projecting over the lungs are compatible with pads for telemetry leads. The cardiopericardial silhouette is within normal limits for size. The visualized bony structures of the thorax are intact. IMPRESSION: No active cardiopulmonary disease. Electronically Signed   By: Misty Stanley M.D.   On: 06/19/2016 14:52   Ct Head Wo Contrast  Result Date: 06/19/2016 CLINICAL DATA:  Dizziness and fall.  Difficulty moving right arm. EXAM: CT HEAD WITHOUT CONTRAST TECHNIQUE: Contiguous axial images were obtained from the base of the skull through the vertex without intravenous contrast. COMPARISON:  02/10/2016 FINDINGS: Ventricles, cisterns and other CSF spaces are within normal. There is no mass, mass effect, shift of midline structures or acute hemorrhage. No evidence of  acute infarction. Remaining bones and soft tissues are within normal. IMPRESSION: No acute intracranial findings. Electronically Signed   By: Marin Olp M.D.   On: 06/19/2016 12:52   Ct Angio Neck W Or Wo Contrast  Result Date: 06/19/2016 CLINICAL DATA:  60 year old female with episode of dizziness yesterday, TIAs possibly from posterior circulation disease. Initial encounter. Personal history of left parotid mass excision 15 years ago, reportedly benign. EXAM: CT ANGIOGRAPHY HEAD AND NECK TECHNIQUE: Multidetector CT imaging of the head and neck was performed using the standard protocol during bolus administration of intravenous contrast. Multiplanar CT image reconstructions and MIPs were obtained to evaluate the  vascular anatomy. Carotid stenosis measurements (when applicable) are obtained utilizing NASCET criteria, using the distal internal carotid diameter as the denominator. CONTRAST:  50 mL Isovue 370 COMPARISON:  Head CT without contrast 1230 hours today. Neck CT 02/13/2016. FINDINGS: CTA NECK Skeleton:  No acute osseous abnormality identified. Visualized paranasal sinuses and mastoids are stable and well pneumatized. Other neck: Negative lung apices. No superior mediastinal lymphadenopathy. Negative thyroid, larynx, pharynx, parapharyngeal spaces, retropharyngeal space, sublingual space, submandibular glands, and right parotid gland. There is continued abnormal soft tissue nodularity along the inferior left parotid gland. This has a nodular reversed C-shaped configuration (series 9, image 159) and appears stable since March measuring up to 15 mm in thickness. This is inseparable from an overlying skin incision. Regional left level 2 lymph nodes are stable and within normal limits. No cervical lymphadenopathy or new soft tissue mass. Aortic arch: Borderline bovine type arch configuration. No arch atherosclerosis or great vessel origin stenosis. Right carotid system: Negative. Left carotid system: Negative. Vertebral arteries:No proximal subclavian stenosis. Both vertebral artery origins are normal (the left on series 8, image 122 an the right on image 116). Codominant vertebral arteries are patent and appear normal to the skullbase. CTA HEAD Posterior circulation: Normal distal vertebral arteries. Both PICA origins appear normal. Normal vertebrobasilar junction. No distal vertebral artery or basilar artery stenosis. Normal SCA origins. The PCA origins are somewhat shared from the basilar tip (series 11, image 22). Posterior communicating arteries are diminutive or absent. Bilateral PCA branches are within normal limits. Anterior circulation: Both ICA siphons are patent without stenosis. There is minimal calcified  plaque on the right. No siphon aneurysm. Patent carotid termini. Dominant left A1 segment, right A1 is diminutive. Anterior communicating artery and bilateral ACA branches are within normal limits. Left MCA M1 segment, bifurcation, and left MCA branches are normal. Right MCA M1 segment, trifurcation and right MCA branches are normal. Venous sinuses: Patent. Anatomic variants: Dominant left ACA A1 segment. Delayed phase: Stable and normal gray-white matter differentiation. No cortically based acute infarct identified. No abnormal enhancement identified. IMPRESSION: 1. Negative CTA head and neck. No atherosclerosis or stenosis in the neck. Minimal right ICA siphon calcified plaque. No intracranial aneurysm or stenosis identified. 2. Continued abnormal soft tissue nodularity along the posterior and inferior left parotid space and inseparable from an overlying skin incision. 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 3. Stable and negative CT appearance of the brain. Electronically Signed   By: Genevie Ann M.D.   On: 06/19/2016 14:51   Mr Brain Wo Contrast  Result Date: 06/19/2016 CLINICAL DATA:  60 year old female with episode of dizziness yesterday, TIAs possibly from posterior circulation disease. Initial encounter. EXAM: MRI HEAD WITHOUT CONTRAST TECHNIQUE: Multiplanar,  multiecho pulse sequences of the brain and surrounding structures were obtained without intravenous contrast. COMPARISON:  CTA head and neck 1417 hours today reported separately. FINDINGS: Cerebral volume is normal. No restricted diffusion to suggest acute infarction. No midline shift, mass effect, evidence of mass lesion, ventriculomegaly, extra-axial collection or acute intracranial hemorrhage. Cervicomedullary junction and pituitary are within normal limits. Major intracranial vascular flow voids  appear normal. Pearline Cables and white matter signal is within normal limits for age throughout the brain. No cortical encephalomalacia or chronic cerebral blood products identified. Visible internal auditory structures appear normal. Mastoids are clear. Negative visualized cervical spine. Normal bone marrow signal. Negative orbit and scalp soft tissues. Paranasal sinuses are clear. Left inferior parotid space nodular soft tissue re- demonstrated (series 2, image 21 and series 10, image 12). IMPRESSION: 1. Normal for age noncontrast MRI appearance of the brain. 2. Left inferior parotid space nodular soft tissue re- demonstrated, see also neck CTA from today. Electronically Signed   By: Genevie Ann M.D.   On: 06/19/2016 16:07     Assessment & Plan:  Plan  I have changed Ms. Arizmendi's topiramate. I am also having her maintain her acetaminophen, fluticasone, meclizine, ranitidine, Multiple Vitamins-Minerals (HAIR/SKIN/NAILS PO), Calcium Carb-Cholecalciferol (CALCIUM 600 + D PO), docusate sodium, Vitamin D, ALPRAZolam, FLUoxetine, traZODone, clorazepate, and erythromycin.  Meds ordered this encounter  Medications  . topiramate (TOPAMAX) 25 MG tablet    Sig: Take 1 tablet (25 mg total) by mouth daily.    Dispense:  30 tablet    Refill:  2    Problem List Items Addressed This Visit    ITP (idiopathic thrombocytopenic purpura) s/p splenectomy 1995 (Chronic)    Stable, following with hematology will continue to monitor      Overweight    Encouraged DASH diet, decrease po intake and increase exercise as tolerated. Needs 7-8 hours of sleep nightly. Avoid trans fats, eat small, frequent meals every 4-5 hours with lean proteins, complex carbs and healthy fats. Minimize simple carbs      Chronic migraine    Recent episode of neurologic symptoms is now following with neurology for possibility of TIA, no recent episode. No changes today      Relevant Medications   topiramate (TOPAMAX) 25 MG tablet    Other Visit  Diagnoses    Encounter for immunization       Relevant Orders   Flu Vaccine QUAD 36+ mos IM (Completed)      Follow-up: Return in about 2 months (around 09/30/2016).  Penni Homans, MD

## 2016-08-22 ENCOUNTER — Ambulatory Visit: Payer: BLUE CROSS/BLUE SHIELD | Admitting: Nurse Practitioner

## 2016-09-03 MED FILL — TOPIRAMATE 25 MG TABLET: 25 | 30 days supply | Qty: 30 | Fill #1

## 2016-09-03 MED FILL — OMEPRAZOLE DR 20 MG CAPSULE: 20 | 90 days supply | Qty: 90 | Fill #2

## 2016-09-03 MED FILL — FLUoxetine HCL 20 MG CAPS: 20 | 30 days supply | Qty: 60 | Fill #3

## 2016-09-27 ENCOUNTER — Other Ambulatory Visit: Payer: Self-pay | Admitting: Family Medicine

## 2016-09-27 MED FILL — traZODone HCL 50 MG TABS: 50 | 90 days supply | Qty: 135 | Fill #1

## 2016-09-27 NOTE — Telephone Encounter (Signed)
Last refill 03/08/2016 #20 with 2 refills. No UDS/No Contract

## 2016-09-28 MED FILL — ALPRAZolam 0.25 MG TABS: 0.25 | 10 days supply | Qty: 20 | Fill #0

## 2016-09-28 MED FILL — CLORAZEPATE 15 MG TABLET: 15 | 90 days supply | Qty: 45 | Fill #0

## 2016-09-28 NOTE — Telephone Encounter (Signed)
Last OV: 07/31/16 Last filled: 06/27/16, # 90, 0 RF Sig: TAKE 1 TABLET BY MOUTH AT BEDTIME  UDS: Not on file. UDS from hospital encounter 06/20/16 positive for benzos.

## 2016-09-28 NOTE — Telephone Encounter (Signed)
Faxed hardcopy for Alprazolam to [facility] 

## 2016-10-02 ENCOUNTER — Encounter: Payer: Self-pay | Admitting: Family Medicine

## 2016-10-02 ENCOUNTER — Ambulatory Visit (INDEPENDENT_AMBULATORY_CARE_PROVIDER_SITE_OTHER): Payer: BLUE CROSS/BLUE SHIELD | Admitting: Family Medicine

## 2016-10-02 VITALS — BP 110/72 | HR 70 | Temp 98.0°F | Ht 68.0 in | Wt 184.5 lb

## 2016-10-02 DIAGNOSIS — D693 Immune thrombocytopenic purpura: Secondary | ICD-10-CM

## 2016-10-02 DIAGNOSIS — Z0000 Encounter for general adult medical examination without abnormal findings: Secondary | ICD-10-CM

## 2016-10-02 DIAGNOSIS — E782 Mixed hyperlipidemia: Secondary | ICD-10-CM | POA: Diagnosis not present

## 2016-10-02 DIAGNOSIS — G459 Transient cerebral ischemic attack, unspecified: Secondary | ICD-10-CM

## 2016-10-02 DIAGNOSIS — K219 Gastro-esophageal reflux disease without esophagitis: Secondary | ICD-10-CM

## 2016-10-02 DIAGNOSIS — R42 Dizziness and giddiness: Secondary | ICD-10-CM

## 2016-10-02 MED ORDER — MECLIZINE HCL 25 MG PO TABS: 25.0000 mg | ORAL_TABLET | Freq: Three times a day (TID) | ORAL | 0 refills | Status: DC | PRN

## 2016-10-02 MED FILL — MECLIZINE 25 MG TABLET: 25 | 10 days supply | Qty: 30 | Fill #0

## 2016-10-02 NOTE — Assessment & Plan Note (Signed)
Rare no recent flares, given refill on Meclizine to use prn

## 2016-10-02 NOTE — Assessment & Plan Note (Signed)
Stable x 20 years. Previously followed with hematology but doing well and asymptomatic, will monitor and refer back as needed

## 2016-10-02 NOTE — Assessment & Plan Note (Addendum)
Avoid offending foods, take probiotics. Do not eat large meals in late evening and consider raising head of bed.  

## 2016-10-02 NOTE — Progress Notes (Signed)
Pre visit review using our clinic review tool, if applicable. No additional management support is needed unless otherwise documented below in the visit note. 

## 2016-10-02 NOTE — Patient Instructions (Signed)
Benign Positional Vertigo Vertigo is the feeling that you or your surroundings are moving when they are not. Benign positional vertigo is the most common form of vertigo. The cause of this condition is not serious (is benign). This condition is triggered by certain movements and positions (is positional). This condition can be dangerous if it occurs while you are doing something that could endanger you or others, such as driving.  CAUSES In many cases, the cause of this condition is not known. It may be caused by a disturbance in an area of the inner ear that helps your brain to sense movement and balance. This disturbance can be caused by a viral infection (labyrinthitis), head injury, or repetitive motion. RISK FACTORS This condition is more likely to develop in:  Women.  People who are 50 years of age or older. SYMPTOMS Symptoms of this condition usually happen when you move your head or your eyes in different directions. Symptoms may start suddenly, and they usually last for less than a minute. Symptoms may include:  Loss of balance and falling.  Feeling like you are spinning or moving.  Feeling like your surroundings are spinning or moving.  Nausea and vomiting.  Blurred vision.  Dizziness.  Involuntary eye movement (nystagmus). Symptoms can be mild and cause only slight annoyance, or they can be severe and interfere with daily life. Episodes of benign positional vertigo may return (recur) over time, and they may be triggered by certain movements. Symptoms may improve over time. DIAGNOSIS This condition is usually diagnosed by medical history and a physical exam of the head, neck, and ears. You may be referred to a health care provider who specializes in ear, nose, and throat (ENT) problems (otolaryngologist) or a provider who specializes in disorders of the nervous system (neurologist). You may have additional testing, including:  MRI.  A CT scan.  Eye movement tests. Your  health care provider may ask you to change positions quickly while he or she watches you for symptoms of benign positional vertigo, such as nystagmus. Eye movement may be tested with an electronystagmogram (ENG), caloric stimulation, the Dix-Hallpike test, or the roll test.  An electroencephalogram (EEG). This records electrical activity in your brain.  Hearing tests. TREATMENT Usually, your health care provider will treat this by moving your head in specific positions to adjust your inner ear back to normal. Surgery may be needed in severe cases, but this is rare. In some cases, benign positional vertigo may resolve on its own in 2-4 weeks. HOME CARE INSTRUCTIONS Safety  Move slowly.Avoid sudden body or head movements.  Avoid driving.  Avoid operating heavy machinery.  Avoid doing any tasks that would be dangerous to you or others if a vertigo episode would occur.  If you have trouble walking or keeping your balance, try using a cane for stability. If you feel dizzy or unstable, sit down right away.  Return to your normal activities as told by your health care provider. Ask your health care provider what activities are safe for you. General Instructions  Take over-the-counter and prescription medicines only as told by your health care provider.  Avoid certain positions or movements as told by your health care provider.  Drink enough fluid to keep your urine clear or pale yellow.  Keep all follow-up visits as told by your health care provider. This is important. SEEK MEDICAL CARE IF:  You have a fever.  Your condition gets worse or you develop new symptoms.  Your family or friends   notice any behavioral changes.  Your nausea or vomiting gets worse.  You have numbness or a "pins and needles" sensation. SEEK IMMEDIATE MEDICAL CARE IF:  You have difficulty speaking or moving.  You are always dizzy.  You faint.  You develop severe headaches.  You have weakness in your  legs or arms.  You have changes in your hearing or vision.  You develop a stiff neck.  You develop sensitivity to light.   This information is not intended to replace advice given to you by your health care provider. Make sure you discuss any questions you have with your health care provider.   Document Released: 08/27/2006 Document Revised: 08/10/2015 Document Reviewed: 03/14/2015 Elsevier Interactive Patient Education 2016 [employer]  

## 2016-10-02 NOTE — Assessment & Plan Note (Signed)
Encouraged heart healthy diet, increase exercise, avoid trans fats, consider a krill oil cap daily 

## 2016-10-02 NOTE — Assessment & Plan Note (Signed)
No recurrence. 

## 2016-10-02 NOTE — Progress Notes (Signed)
[name], female   DOB: Oct 06, 1956, 60 y.o.   MRN: [id number]   Subjective:    [name], female    DOB: 03-25-1956, 61 y.o.   MRN: [id number]  Chief Complaint  Patient presents with  . Follow-up    HPI Patient is in today for follow up. Her headaches and side effects are much better with the change in the Topamax to 25 mg qhs. She has infrequent and manageable pain and only mild scotomata at times. No recurrent neurologic complaints. 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 No other trips to ED or recent illness. Denies CP/palp/SOB/HA/congestion/fevers/GI or GU c/o. Taking meds as prescribed  Past Medical History:  Diagnosis Date  . Advanced care planning/counseling discussion 09/01/2014  . Allergic state 04/17/2013  . Anxiety   . Clotting disorder ([place])   . Depression 03-04-12   tx. meds  . Depression with anxiety 04/17/2013  . ETOH abuse   . Fibromyalgia 03-04-12   Sporadic pain  . GERD (gastroesophageal reflux disease) 08/17/2013  . History of transfusion of platelets X 1   "related to ITP"  . Hyperlipidemia, mixed 10/16/2015  . ITP (idiopathic thrombocytopenic purpura) 03-04-12   Dx.  '95- blow normal levels, but stable.  . Lipoma 03-04-12   Rt. back about scapular ares  . Migraine    "that's what the dr thinks I had yesterday; still unsure; never had one before" (06/19/2016)  . Overweight 03/06/2015  . PONV (postoperative nausea and vomiting)   . Preventative health care 04/17/2013; 08/17/2013  . Solitary pulmonary nodule 08/17/2013   LLL seen on CT in March 2014    Past Surgical History:  Procedure Laterality Date  . APPENDECTOMY  1995  . BREAST BIOPSY Left 2012   needle biopsy(benign)-Titanium needle marker remains  . CHOLECYSTECTOMY N/A 04/24/2013   Procedure: LAPAROSCOPIC CHOLECYSTECTOMY WITH INTRAOPERATIVE CHOLANGIOGRAM;  Surgeon: Haywood Lasso, MD;  Location: [facility];  Service:  General;  Laterality: N/A;  . COLONOSCOPY  2008   WNL, Dr Cristina Gong  . IRRIGATION AND DEBRIDEMENT ABSCESS  ~ 2005 X 2   buttocks  . LIPOMA EXCISION  ~ 04/2016   "base of my neck"  . MASS EXCISION  03/10/2012   Procedure: EXCISION MASS;  Surgeon: Odis Hollingshead, MD;  Location: [facility];  Service: General;  Laterality: N/A;  Removal of back lipoma  . 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 SPLENECTOMY, TOTAL  1995  . TOTAL ABDOMINAL HYSTERECTOMY  1995   total, endometriosis, fibroid, ovarian atrophy    Family History  Problem Relation Age of Onset  . Heart disease Maternal Grandmother   . Heart disease Maternal Grandfather   . Heart attack Maternal Grandfather 62  . Heart disease Paternal Grandmother   . Heart attack Paternal Grandmother 86  . Heart disease Paternal Grandfather   . Cancer Paternal Grandfather     chest- smoker  . Diabetes Mother     type 2- controlled by diet  . Atrial fibrillation Father   . Hyperlipidemia Sister   . Migraines Sister   . Depression Sister     Social History   Social History  . Marital status: Single    Spouse name: N/A  . Number of children: 0  . Years of education: 12   Occupational History  . Not on file.   Social History Main Topics  .  Smoking status: Former Smoker    Packs/day: 0.75    Years: 5.00    Types: Cigarettes    Quit date: 02/21/1983  . Smokeless tobacco: Never Used  . Alcohol use 7.2 oz/week    12 Cans of beer per week  . Drug use: No  . 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 Not on file   Social History Narrative   Lives at home w/ her roommate   Right-handed   Caffeine: has cut down to 1-2 cups 1/2 caf coffee in the a.m.    Outpatient Medications Prior to Visit  Medication Sig Dispense Refill  . acetaminophen (TYLENOL) 500 MG tablet Take 1,000 mg by mouth every 6 (six) hours as  needed for pain.    Marland Kitchen ALPRAZolam (XANAX) 0.25 MG tablet TAKE 1 TABLET BY MOUTH TWICE DAILY AS NEEDED FOR SLEEP OR ANXIETY 20 tablet 2  . Calcium Carb-Cholecalciferol (CALCIUM 600 + D PO) Take 1 tablet by mouth daily.    . Cholecalciferol (VITAMIN D) 2000 units CAPS Take 2,000 Units by mouth daily.    . clorazepate (TRANXENE) 7.5 MG tablet TAKE 1 TABLET BY MOUTH AT BEDTIME 90 tablet 0  . docusate sodium (COLACE) 100 MG capsule Take 100 mg by mouth daily.    Marland Kitchen FLUoxetine (PROZAC) 20 MG capsule TAKE 2 CAPSULES BY MOUTH DAILY 60 capsule 5  . fluticasone (FLONASE) 50 MCG/ACT nasal spray Place 2 sprays into both nostrils daily. 16 g 6  . Multiple Vitamins-Minerals (HAIR/SKIN/NAILS PO) Take 3 tablets by mouth daily.    . ranitidine (ZANTAC) 150 MG tablet Take 150 mg by mouth at bedtime.    . topiramate (TOPAMAX) 25 MG tablet Take 1 tablet (25 mg total) by mouth daily. 30 tablet 2  . traZODone (DESYREL) 50 MG tablet TAKE 1 AND 1/2 TABLETS (75 MG TOTAL) BY MOUTH AT BEDTIME. 135 tablet 1  . erythromycin ophthalmic ointment Place 1 application into the left eye 3 (three) times daily. 3.5 g 0  . meclizine (ANTIVERT) 25 MG tablet TAKE 1 TABLET (25 MG) BY MOUTH 3 TIMES A DAY AS NEEDED FOR DIZZINESS OR NAUSEA. 30 tablet 0   No facility-administered medications prior to visit.     Allergies  Allergen Reactions  . Codeine Nausea And Vomiting    Review of Systems  Constitutional: Positive for malaise/fatigue. Negative for fever.  HENT: Negative for congestion.   Eyes: Negative for blurred vision.  Respiratory: Negative for shortness of breath.   Cardiovascular: Negative for chest pain, palpitations and leg swelling.  Gastrointestinal: Negative for abdominal pain, blood in stool and nausea.  Genitourinary: Negative for dysuria and frequency.  Musculoskeletal: Negative for falls.  Skin: Negative for rash.  Neurological: Negative for dizziness, loss of consciousness and headaches.  Endo/Heme/Allergies:  Negative for environmental allergies.  Psychiatric/Behavioral: Negative for depression. The patient is not nervous/anxious.        Objective:    Physical Exam  Constitutional: She is oriented to person, place, and time. She appears well-developed and well-nourished. No distress.  HENT:  Head: Normocephalic and atraumatic.  Nose: Nose normal.  Eyes: Right eye exhibits no discharge. Left eye exhibits no discharge.  Neck: Normal range of motion. Neck supple.  Cardiovascular: Normal rate and regular rhythm.   No murmur heard. Pulmonary/Chest: Effort normal and breath sounds normal.  Abdominal: Soft. Bowel sounds are  normal. There is no tenderness.  Musculoskeletal: She exhibits no edema.  Neurological: She is alert and oriented to person, place, and time.  Skin: Skin is warm and dry.  Psychiatric: She has a normal mood and affect.  Nursing note and vitals reviewed.   BP 110/72 (BP Location: Left Arm, Patient Position: Sitting, Cuff Size: Normal)   Pulse 70   Temp 98 F (36.7 C) (Oral)   Ht 5\' 8"  (1.727 m)   Wt 184 lb 8 oz (83.7 kg)   SpO2 98%   BMI 28.05 kg/m  Wt Readings from Last 3 Encounters:  10/02/16 184 lb 8 oz (83.7 kg)  07/31/16 186 lb 6 oz (84.5 kg)  07/19/16 181 lb 12 oz (82.4 kg)     Lab Results  Component Value Date   WBC 7.4 07/02/2016   HGB 12.7 07/02/2016   HCT 38.1 07/02/2016   PLT 68.0 (L) 07/02/2016   GLUCOSE 102 (H) 06/19/2016   CHOL 201 (H) 06/19/2016   TRIG 130 06/19/2016   HDL 66 06/19/2016   LDLDIRECT 86.0 03/27/2016   LDLCALC 109 (H) 06/19/2016   ALT 16 06/19/2016   AST 23 06/19/2016   NA 140 06/19/2016   K 4.3 06/19/2016   CL 100 (L) 06/19/2016   CREATININE 0.80 06/19/2016   BUN 18 06/19/2016   CO2 26 06/19/2016   TSH 1.23 03/27/2016   INR 1.02 06/19/2016   HGBA1C 4.8 06/19/2016    Lab Results  Component Value Date   TSH 1.23 03/27/2016   Lab Results  Component Value Date   WBC 7.4 07/02/2016   HGB 12.7 07/02/2016   HCT  38.1 07/02/2016   MCV 93.4 07/02/2016   PLT 68.0 (L) 07/02/2016   Lab Results  Component Value Date   NA 140 06/19/2016   K 4.3 06/19/2016   CO2 26 06/19/2016   GLUCOSE 102 (H) 06/19/2016   BUN 18 06/19/2016   CREATININE 0.80 06/19/2016   BILITOT 0.5 06/19/2016   ALKPHOS 81 06/19/2016   AST 23 06/19/2016   ALT 16 06/19/2016   PROT 8.1 06/19/2016   ALBUMIN 4.0 06/19/2016   CALCIUM 9.4 06/19/2016   ANIONGAP 7 06/19/2016   GFR 72.69 03/27/2016   Lab Results  Component Value Date   CHOL 201 (H) 06/19/2016   Lab Results  Component Value Date   HDL 66 06/19/2016   Lab Results  Component Value Date   LDLCALC 109 (H) 06/19/2016   Lab Results  Component Value Date   TRIG 130 06/19/2016   Lab Results  Component Value Date   CHOLHDL 3.0 06/19/2016   Lab Results  Component Value Date   HGBA1C 4.8 06/19/2016       Assessment & Plan:   Problem List Items Addressed This Visit    Idiopathic thrombocytopenic purpura ([place]) (Chronic)    Stable x 20 years. Previously followed with hematology but doing well and asymptomatic, will monitor and refer back as needed      GERD (gastroesophageal reflux disease)    Avoid offending foods, take probiotics. Do not eat large meals in late evening and consider raising head of bed.       Relevant Medications   meclizine (ANTIVERT) 25 MG tablet   Other Relevant Orders   TSH   CBC   Comprehensive metabolic panel   Preventative health care - Primary   Relevant Orders   TSH   CBC   Comprehensive metabolic panel   Lipid panel   HIV antibody (  with reflex)   Hepatitis C Antibody   Vertigo    Rare no recent flares, given refill on Meclizine to use prn      Relevant Orders   TSH   Comprehensive metabolic panel   Hyperlipidemia, mixed    Encouraged heart healthy diet, increase exercise, avoid trans fats, consider a krill oil cap daily      Relevant Orders   TSH   Lipid panel   TIA (transient ischemic attack)    No  recurrence       Other Visit Diagnoses   None.     I have discontinued Ms. Bruyere's erythromycin. I have also changed her meclizine. Additionally, I am having her maintain her acetaminophen, fluticasone, ranitidine, Multiple Vitamins-Minerals (HAIR/SKIN/NAILS PO), Calcium Carb-Cholecalciferol (CALCIUM 600 + D PO), docusate sodium, Vitamin D, FLUoxetine, traZODone, topiramate, clorazepate, and ALPRAZolam.  Meds ordered this encounter  Medications  . meclizine (ANTIVERT) 25 MG tablet    Sig: Take 1 tablet (25 mg total) by mouth 3 (three) times daily as needed for dizziness.    Dispense:  30 tablet    Refill:  0     Penni Homans, MD

## 2016-10-09 ENCOUNTER — Encounter: Payer: BLUE CROSS/BLUE SHIELD | Admitting: Family Medicine

## 2016-10-09 MED FILL — TOPIRAMATE 25 MG TABLET: 25 | 30 days supply | Qty: 30 | Fill #2

## 2016-10-09 MED FILL — FLUoxetine HCL 20 MG CAPS: 20 | 30 days supply | Qty: 60 | Fill #4

## 2016-11-08 ENCOUNTER — Other Ambulatory Visit: Payer: Self-pay | Admitting: Family Medicine

## 2016-11-08 MED FILL — FLUoxetine HCL 20 MG CAPS: 20 | 30 days supply | Qty: 60 | Fill #5

## 2016-11-08 MED FILL — TOPIRAMATE 25 MG TABLET: 25 | 30 days supply | Qty: 30 | Fill #0

## 2016-11-19 ENCOUNTER — Ambulatory Visit (INDEPENDENT_AMBULATORY_CARE_PROVIDER_SITE_OTHER): Payer: BLUE CROSS/BLUE SHIELD | Admitting: Neurology

## 2016-11-19 ENCOUNTER — Encounter: Payer: Self-pay | Admitting: Neurology

## 2016-11-19 VITALS — BP 125/83 | HR 74 | Resp 20 | Ht 68.0 in | Wt 187.0 lb

## 2016-11-19 DIAGNOSIS — IMO0002 Reserved for concepts with insufficient information to code with codable children: Secondary | ICD-10-CM

## 2016-11-19 DIAGNOSIS — G43709 Chronic migraine without aura, not intractable, without status migrainosus: Secondary | ICD-10-CM | POA: Diagnosis not present

## 2016-11-19 NOTE — Progress Notes (Signed)
Reason for visit: Migraine equivalent  Laura Bender is an 60 y.o. female  History of present illness:  Laura Bender is a 60 year old right-handed white female with a history of migraine equivalent. The patient has had stroke-like symptoms and visual complaints with her episodes. The patient has gone off of Premarin and she has started Topamax, taking only 25 mg at night. The patient has noted a significant reduction in the frequency of her events, now occurring only once or twice a month. The patient indicates that when the events do occur, they are less severe. The patient is unable to take aspirin secondary to ITP. She comes back to this office for an evaluation. She is quite pleased with her improvement in symptoms.  Past Medical History:  Diagnosis Date  . Advanced care planning/counseling discussion 09/01/2014  . Allergic state 04/17/2013  . Anxiety   . Clotting disorder ([place])   . Depression 03-04-12   tx. meds  . Depression with anxiety 04/17/2013  . ETOH abuse   . Fibromyalgia 03-04-12   Sporadic pain  . GERD (gastroesophageal reflux disease) 08/17/2013  . History of transfusion of platelets X 1   "related to ITP"  . Hyperlipidemia, mixed 10/16/2015  . ITP (idiopathic thrombocytopenic purpura) 03-04-12   Dx.  '95- blow normal levels, but stable.  . Lipoma 03-04-12   Rt. back about scapular ares  . Migraine    "that's what the dr thinks I had yesterday; still unsure; never had one before" (06/19/2016)  . Overweight 03/06/2015  . PONV (postoperative nausea and vomiting)   . Preventative health care 04/17/2013; 08/17/2013  . Solitary pulmonary nodule 08/17/2013   LLL seen on CT in March 2014    Past Surgical History:  Procedure Laterality Date  . APPENDECTOMY  1995  . BREAST BIOPSY Left 2012   needle biopsy(benign)-Titanium needle marker remains  . CHOLECYSTECTOMY N/A 04/24/2013   Procedure: LAPAROSCOPIC CHOLECYSTECTOMY WITH INTRAOPERATIVE CHOLANGIOGRAM;  Surgeon: Haywood Lasso, MD;  Location: [facility];  Service: General;  Laterality: N/A;  . COLONOSCOPY  2008   WNL, Dr Cristina Gong  . IRRIGATION AND DEBRIDEMENT ABSCESS  ~ 2005 X 2   buttocks  . LIPOMA EXCISION  ~ 04/2016   "base of my neck"  . MASS EXCISION  03/10/2012   Procedure: EXCISION MASS;  Surgeon: Odis Hollingshead, MD;  Location: [facility];  Service: General;  Laterality: N/A;  Removal of back lipoma  . 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 SPLENECTOMY, TOTAL  1995  . TOTAL ABDOMINAL HYSTERECTOMY  1995   total, endometriosis, fibroid, ovarian atrophy    Family History  Problem Relation Age of Onset  . Heart disease Maternal Grandmother   . Heart disease Maternal Grandfather   . Heart attack Maternal Grandfather 62  . Heart disease Paternal Grandmother   . Heart attack Paternal Grandmother 86  . Heart disease Paternal Grandfather   . Cancer Paternal Grandfather     chest- smoker  . Diabetes Mother     type 2- controlled by diet  . Atrial fibrillation Father   . Hyperlipidemia Sister   . Migraines Sister   . Depression Sister     Social history:  reports that she quit smoking about 33 years ago. Her smoking use included Cigarettes. She has a 3.75 pack-year smoking history. She has never used smokeless tobacco. She reports that she drinks about 7.2 oz of alcohol per week . She reports that she  does not use drugs.    Allergies  Allergen Reactions  . Codeine Nausea And Vomiting    Medications:  Prior to Admission medications   Medication Sig Start Date End Date Taking? Authorizing Provider  acetaminophen (TYLENOL) 500 MG tablet Take 1,000 mg by mouth every 6 (six) hours as needed for pain.   Yes Historical Provider, MD  ALPRAZolam (XANAX) 0.25 MG tablet TAKE 1 TABLET BY MOUTH TWICE DAILY AS NEEDED FOR SLEEP OR ANXIETY 09/27/16  Yes [name], MD  Calcium Carb-Cholecalciferol (CALCIUM 600 + D PO) Take 1 tablet by mouth daily.   Yes Historical Provider, MD  Cholecalciferol  (VITAMIN D) 2000 units CAPS Take 2,000 Units by mouth daily.   Yes Historical Provider, MD  clorazepate (TRANXENE) 7.5 MG tablet TAKE 1 TABLET BY MOUTH AT BEDTIME 09/28/16  Yes [name], MD  docusate sodium (COLACE) 100 MG capsule Take 100 mg by mouth daily.   Yes Historical Provider, MD  FLUoxetine (PROZAC) 20 MG capsule TAKE 2 CAPSULES BY MOUTH DAILY 05/24/16  Yes [name], MD  fluticasone (FLONASE) 50 MCG/ACT nasal spray Place 2 sprays into both nostrils daily. 02/10/14  Yes Brunetta Jeans, PA-C  meclizine (ANTIVERT) 25 MG tablet Take 1 tablet (25 mg total) by mouth 3 (three) times daily as needed for dizziness. 10/02/16  Yes [name], MD  Multiple Vitamins-Minerals (HAIR/SKIN/NAILS PO) Take 3 tablets by mouth daily.   Yes Historical Provider, MD  ranitidine (ZANTAC) 150 MG tablet Take 150 mg by mouth at bedtime.   Yes Historical Provider, MD  topiramate (TOPAMAX) 25 MG tablet TAKE 1 TABLET (25 MG TOTAL) BY MOUTH DAILY. 11/08/16  Yes [name], MD  traZODone (DESYREL) 50 MG tablet TAKE 1 AND 1/2 TABLETS (75 MG TOTAL) BY MOUTH AT BEDTIME. 06/27/16  Yes [name], MD    ROS:  Out of a complete 14 system review of symptoms, the patient complains only of the following symptoms, and all other reviewed systems are negative.  Dizziness, speech difficulty Agitation, depression, anxiety Joint pain Daytime sleepiness  Blood pressure 125/83, pulse 74, resp. rate 20, height 5\' 8"  (1.727 m), weight 187 lb (84.8 kg).  Physical Exam  General: The patient is alert and cooperative at the time of the examination.  Skin: No significant peripheral edema is noted.   Neurologic Exam  Mental status: The patient is alert and oriented x 3 at the time of the examination. The patient has apparent normal recent and remote memory, with an apparently normal attention span and concentration ability.   Cranial nerves: Facial symmetry is present. Speech is normal, no aphasia or  dysarthria is noted. Extraocular movements are full. Visual fields are full.  Motor: The patient has good strength in all 4 extremities.  Sensory examination: Soft touch sensation is symmetric on the face, arms, and legs.  Coordination: The patient has good finger-nose-finger and heel-to-shin bilaterally.  Gait and station: The patient has a normal gait. Tandem gait is normal. Romberg is negative. No drift is seen.  Reflexes: Deep tendon reflexes are symmetric.   Assessment/Plan:  1. Neurologic migraine  The patient is currently doing well with her symptoms. She is on low dose Topamax, and she is tolerating this well. Her primary care physician is handling the prescriptions currently. She will follow up through this office in the future if needed.  Jill Alexanders MD 11/19/2016 7:57 AM  [facility] [address] [place], [place]  [address]  Phone [phone] Fax [phone]

## 2016-12-11 DIAGNOSIS — H52223 Regular astigmatism, bilateral: Secondary | ICD-10-CM | POA: Diagnosis not present

## 2016-12-11 DIAGNOSIS — H524 Presbyopia: Secondary | ICD-10-CM | POA: Diagnosis not present

## 2016-12-11 DIAGNOSIS — H5213 Myopia, bilateral: Secondary | ICD-10-CM | POA: Diagnosis not present

## 2016-12-12 ENCOUNTER — Other Ambulatory Visit: Payer: Self-pay | Admitting: Family Medicine

## 2016-12-12 MED FILL — OMEPRAZOLE DR 20 MG CAPSULE: 20 | 90 days supply | Qty: 90 | Fill #0

## 2016-12-12 MED FILL — FLUoxetine HCL 20 MG CAPS: 20 | 30 days supply | Qty: 60 | Fill #0

## 2016-12-28 ENCOUNTER — Other Ambulatory Visit: Payer: Self-pay | Admitting: Family Medicine

## 2016-12-28 MED FILL — TOPIRAMATE 25 MG TABLET: 25 | 30 days supply | Qty: 30 | Fill #1

## 2016-12-28 MED FILL — CLORAZEPATE 15 MG TABLET: 15 | 90 days supply | Qty: 45 | Fill #0

## 2016-12-28 NOTE — Telephone Encounter (Signed)
Faxed hardcopy for Clorazepate to [employer] [facility] [employer]

## 2016-12-28 NOTE — Telephone Encounter (Signed)
Last refill 09/28/2016  #90 with 0 refills Last office visit 10/02/2016 Next scheduled appointment is 04/09/2017 No UDS/No Contract

## 2017-01-01 ENCOUNTER — Telehealth: Payer: Self-pay | Admitting: Family Medicine

## 2017-01-01 ENCOUNTER — Other Ambulatory Visit: Payer: Self-pay | Admitting: Family Medicine

## 2017-01-01 DIAGNOSIS — D696 Thrombocytopenia, unspecified: Secondary | ICD-10-CM

## 2017-01-01 DIAGNOSIS — E785 Hyperlipidemia, unspecified: Principal | ICD-10-CM

## 2017-01-01 DIAGNOSIS — M791 Myalgia, unspecified site: Secondary | ICD-10-CM

## 2017-01-01 DIAGNOSIS — E1169 Type 2 diabetes mellitus with other specified complication: Secondary | ICD-10-CM

## 2017-01-01 NOTE — Telephone Encounter (Signed)
Cmp, cbc, tsh, lipid, for myalgia, thrombocytopenia, hyperlipidemia.

## 2017-01-01 NOTE — Telephone Encounter (Signed)
Relation to [id number] Call back [place]   Reason for call:  Patient requesting pre visit labs and would like order placed at [facility] lab, patient follow up appointment 03/19/17 with PCP, please advise

## 2017-01-01 NOTE — Telephone Encounter (Signed)
Let me know what to order and will do at the [facility] as she requested. Thanks,

## 2017-01-01 NOTE — Telephone Encounter (Signed)
Put order in for labs at the [facility]. Called the patient informed her that her request has been competed.

## 2017-01-14 MED FILL — FLUoxetine HCL 20 MG CAPS: 20 | 30 days supply | Qty: 60 | Fill #1

## 2017-02-04 MED FILL — TOPIRAMATE 25 MG TABLET: 25 | 30 days supply | Qty: 30 | Fill #2

## 2017-02-14 ENCOUNTER — Encounter: Payer: Self-pay | Admitting: Family Medicine

## 2017-02-14 ENCOUNTER — Ambulatory Visit (INDEPENDENT_AMBULATORY_CARE_PROVIDER_SITE_OTHER): Payer: BLUE CROSS/BLUE SHIELD | Admitting: Family Medicine

## 2017-02-14 VITALS — BP 96/60 | HR 69 | Temp 97.9°F | Ht 68.0 in | Wt 186.8 lb

## 2017-02-14 DIAGNOSIS — R05 Cough: Secondary | ICD-10-CM | POA: Diagnosis not present

## 2017-02-14 DIAGNOSIS — R058 Other specified cough: Secondary | ICD-10-CM

## 2017-02-14 MED ORDER — BENZONATATE 100 MG PO CAPS: 100.0000 mg | ORAL_CAPSULE | Freq: Three times a day (TID) | ORAL | 0 refills | Status: DC | PRN

## 2017-02-14 MED FILL — FLUoxetine HCL 20 MG CAPS: 20 | 30 days supply | Qty: 60 | Fill #2

## 2017-02-14 MED FILL — BENZONATATE 100 MG CAP: 100 | 10 days supply | Qty: 30 | Fill #0

## 2017-02-14 NOTE — Patient Instructions (Signed)
Continue to push fluids, practice good hand hygiene, and cover your mouth if you cough.  If you start having fevers, shaking or shortness of breath, seek immediate care.  Ice is your friend. 10-15 min every 3-4 hours when able. If you start getting stiff and achy, use heat for same amount of time and interval.   This cough can last 4-6 weeks after the initial illness.  Ibuprofen 400-600 mg (2-3 tabs) every 6 hours as needed for pain. Can use Tylenol with this safely.

## 2017-02-14 NOTE — Progress Notes (Signed)
Pre visit review using our clinic review tool, if applicable. No additional management support is needed unless otherwise documented below in the visit note. 

## 2017-02-14 NOTE — Progress Notes (Signed)
Chief Complaint  Patient presents with  . Cough    product-x3 weeks now dry-pain in the (L) chest noticed on yest when she coughted now when she breaths, some SOB    Laura Bender here for URI complaints.  Duration: 3 weeks, was treated for bronchitis with abx, improved but still having dry cough and L sided chest pain associated with coughing and taking deep breaths Associated symptoms: chest pain and dry cough Denies: sinus congestion, sinus pain, rhinorrhea, itchy watery eyes, ear pain, ear drainage, sore throat, shortness of breath, myalgia and fevers/rigors Treatment to date: Omnicef, Flonase  Sick contacts: No  ROS:  Const: Denies fevers HEENT: As noted in HPI Lungs: No SOB  Past Medical History:  Diagnosis Date  . Advanced care planning/counseling discussion 09/01/2014  . Allergic state 04/17/2013  . Anxiety   . Clotting disorder ([place])   . Depression 03-04-12   tx. meds  . Depression with anxiety 04/17/2013  . ETOH abuse   . Fibromyalgia 03-04-12   Sporadic pain  . GERD (gastroesophageal reflux disease) 08/17/2013  . History of transfusion of platelets X 1   "related to ITP"  . Hyperlipidemia, mixed 10/16/2015  . ITP (idiopathic thrombocytopenic purpura) 03-04-12   Dx.  '95- blow normal levels, but stable.  . Lipoma 03-04-12   Rt. back about scapular ares  . Migraine    "that's what the dr thinks I had yesterday; still unsure; never had one before" (06/19/2016)  . Overweight 03/06/2015  . PONV (postoperative nausea and vomiting)   . Preventative health care 04/17/2013; 08/17/2013  . Solitary pulmonary nodule 08/17/2013   LLL seen on CT in March 2014   Family History  Problem Relation Age of Onset  . Heart disease Maternal Grandmother   . Heart disease Maternal Grandfather   . Heart attack Maternal Grandfather 62  . Heart disease Paternal Grandmother   . Heart attack Paternal Grandmother 86  . Heart disease Paternal Grandfather   . Cancer Paternal Grandfather    chest- smoker  . Diabetes Mother     type 2- controlled by diet  . Atrial fibrillation Father   . Hyperlipidemia Sister   . Migraines Sister   . Depression Sister     BP 96/60 (BP Location: Right Arm, Patient Position: Sitting, Cuff Size: Normal)   Pulse 69   Temp 97.9 F (36.6 C) (Oral)   Ht 5\' 8"  (1.727 m)   Wt 186 lb 12.8 oz (84.7 kg)   SpO2 98%   BMI 28.40 kg/m  General: Awake, alert, appears stated age HEENT: AT, [place], ears patent b/l and TM's neg, nares patent w/o discharge, pharynx pink and without exudates, MMM Neck: No masses or asymmetry Heart: RRR, no murmurs, no bruits Lungs: CTAB, no accessory muscle use MSK: +TTP over L chest wall at approx rib 8-10 Psych: Age appropriate judgment and insight, normal mood and affect  Post-viral cough syndrome - Plan: benzonatate (TESSALON) 100 MG capsule  Orders as above. Ibuprofen for chest wall pain, low concern for PE or cardiac involvement. Ice/heat. Continue to push fluids, practice good hand hygiene, cover mouth when coughing. F/u prn. If starting to experience fevers, shaking, or shortness of breath, seek immediate care. Pt voiced understanding and agreement to the plan.  [place], DO 02/14/17 3:24 PM

## 2017-03-07 ENCOUNTER — Other Ambulatory Visit: Payer: Self-pay | Admitting: Family Medicine

## 2017-03-07 MED FILL — traZODone HCL 50 MG TABS: 50 | 90 days supply | Qty: 135 | Fill #0

## 2017-03-07 MED FILL — TOPIRAMATE 25 MG TABLET: 25 | 30 days supply | Qty: 30 | Fill #0

## 2017-03-07 NOTE — Telephone Encounter (Signed)
Last refill for trazodone 06/27/2016  #135 with 1 refill Last office visit 10/02/2016----future appt is on 03/19/17

## 2017-03-12 ENCOUNTER — Other Ambulatory Visit (INDEPENDENT_AMBULATORY_CARE_PROVIDER_SITE_OTHER): Payer: BLUE CROSS/BLUE SHIELD

## 2017-03-12 DIAGNOSIS — E1169 Type 2 diabetes mellitus with other specified complication: Secondary | ICD-10-CM | POA: Diagnosis not present

## 2017-03-12 DIAGNOSIS — E785 Hyperlipidemia, unspecified: Secondary | ICD-10-CM

## 2017-03-12 DIAGNOSIS — K219 Gastro-esophageal reflux disease without esophagitis: Secondary | ICD-10-CM

## 2017-03-12 DIAGNOSIS — D696 Thrombocytopenia, unspecified: Secondary | ICD-10-CM

## 2017-03-12 DIAGNOSIS — M791 Myalgia, unspecified site: Secondary | ICD-10-CM

## 2017-03-12 DIAGNOSIS — E782 Mixed hyperlipidemia: Secondary | ICD-10-CM

## 2017-03-12 DIAGNOSIS — Z0000 Encounter for general adult medical examination without abnormal findings: Secondary | ICD-10-CM | POA: Diagnosis not present

## 2017-03-12 DIAGNOSIS — R42 Dizziness and giddiness: Secondary | ICD-10-CM

## 2017-03-12 LAB — CBC
HCT: 40.3 % (ref 36.0–46.0)
Hemoglobin: 13.3 g/dL (ref 12.0–15.0)
MCHC: 32.9 g/dL (ref 30.0–36.0)
MCV: 93.1 fl (ref 78.0–100.0)
Platelets: 71 10*3/uL — ABNORMAL LOW (ref 150.0–400.0)
RBC: 4.33 Mil/uL (ref 3.87–5.11)
RDW: 13.7 % (ref 11.5–15.5)
WBC: 6 10*3/uL (ref 4.0–10.5)

## 2017-03-12 LAB — TSH: TSH: 1.6 u[IU]/mL (ref 0.35–4.50)

## 2017-03-12 LAB — COMPREHENSIVE METABOLIC PANEL
ALBUMIN: 4.3 g/dL (ref 3.5–5.2)
ALK PHOS: 108 U/L (ref 39–117)
ALT: 17 U/L (ref 0–35)
AST: 21 U/L (ref 0–37)
BILIRUBIN TOTAL: 0.4 mg/dL (ref 0.2–1.2)
BUN: 23 mg/dL (ref 6–23)
CO2: 28 mEq/L (ref 19–32)
Calcium: 9.8 mg/dL (ref 8.4–10.5)
Chloride: 104 mEq/L (ref 96–112)
Creatinine, Ser: 0.92 mg/dL (ref 0.40–1.20)
GFR: 66.13 mL/min (ref >60.00)
Glucose, Bld: 100 mg/dL — ABNORMAL HIGH (ref 70–99)
POTASSIUM: 4.7 meq/L (ref 3.5–5.1)
Sodium: 137 mEq/L (ref 135–145)
TOTAL PROTEIN: 8.1 g/dL (ref 6.0–8.3)

## 2017-03-12 LAB — LIPID PANEL
CHOL/HDL RATIO: 3
CHOLESTEROL: 199 mg/dL (ref 0–200)
HDL: 57.7 mg/dL (ref >39.00)
LDL Cholesterol: 102 mg/dL — ABNORMAL HIGH (ref 0–99)
NonHDL: 140.99
Triglycerides: 193 mg/dL — ABNORMAL HIGH (ref 0.0–149.0)
VLDL: 38.6 mg/dL (ref 0.0–40.0)

## 2017-03-13 LAB — HEPATITIS C ANTIBODY: HCV Ab: NEGATIVE

## 2017-03-13 LAB — HIV ANTIBODY (ROUTINE TESTING W REFLEX): HIV 1&2 Ab, 4th Generation: NONREACTIVE

## 2017-03-19 ENCOUNTER — Encounter: Payer: Self-pay | Admitting: Family Medicine

## 2017-03-19 ENCOUNTER — Ambulatory Visit (INDEPENDENT_AMBULATORY_CARE_PROVIDER_SITE_OTHER): Payer: BLUE CROSS/BLUE SHIELD | Admitting: Family Medicine

## 2017-03-19 DIAGNOSIS — R42 Dizziness and giddiness: Secondary | ICD-10-CM

## 2017-03-19 DIAGNOSIS — G43709 Chronic migraine without aura, not intractable, without status migrainosus: Secondary | ICD-10-CM | POA: Diagnosis not present

## 2017-03-19 DIAGNOSIS — E782 Mixed hyperlipidemia: Secondary | ICD-10-CM

## 2017-03-19 DIAGNOSIS — K219 Gastro-esophageal reflux disease without esophagitis: Secondary | ICD-10-CM

## 2017-03-19 DIAGNOSIS — F1010 Alcohol abuse, uncomplicated: Secondary | ICD-10-CM | POA: Diagnosis not present

## 2017-03-19 DIAGNOSIS — T7840XA Allergy, unspecified, initial encounter: Secondary | ICD-10-CM

## 2017-03-19 DIAGNOSIS — IMO0002 Reserved for concepts with insufficient information to code with codable children: Secondary | ICD-10-CM

## 2017-03-19 MED ORDER — TOPIRAMATE 25 MG PO TABS: 25.0000 mg | ORAL_TABLET | Freq: Every day | ORAL | 1 refills | Status: DC

## 2017-03-19 MED ORDER — FLUTICASONE PROPIONATE 50 MCG/ACT NA SUSP: 2.0000 | Freq: Every day | NASAL | 6 refills | Status: DC

## 2017-03-19 MED ORDER — CLORAZEPATE DIPOTASSIUM 7.5 MG PO TABS: 7.5000 mg | ORAL_TABLET | Freq: Every day | ORAL | 1 refills | Status: DC

## 2017-03-19 MED ORDER — FLUOXETINE HCL 20 MG PO CAPS: 40.0000 mg | ORAL_CAPSULE | Freq: Every day | ORAL | 1 refills | Status: DC

## 2017-03-19 MED FILL — CLORAZEPATE 7.5 MG TABLET: 7.5 | 90 days supply | Qty: 90 | Fill #0

## 2017-03-19 MED FILL — FLUoxetine HCL 20 MG CAPS: 20 | 30 days supply | Qty: 60 | Fill #3

## 2017-03-19 MED FILL — FLUTICASONE PROP 50 MCG SPR: 50 | 30 days supply | Qty: 16 | Fill #0

## 2017-03-19 NOTE — Assessment & Plan Note (Signed)
Encouraged heart healthy diet, increase exercise, avoid trans fats, consider a krill oil cap daily 

## 2017-03-19 NOTE — Assessment & Plan Note (Signed)
She acknowledges drinking more over the winter, she is reminded 1-2 beverages daily is the most a woman can consume safely and that alcohol is a sugar so can increase the triglycerides. She is encouraged to cut back or stop

## 2017-03-19 NOTE — Progress Notes (Signed)
[name], female   DOB: [birth year], 61 y.o.   MRN: [id number]   Subjective:    [name], female    DOB: Oct 06, 1956, 61 y.o.   MRN: [id number]  Chief Complaint  Patient presents with  . Follow-up    HPI Patient is in today for Follow-up on numerous medical conditions. She reports generally she is doing well today. No recent febrile illness or hospitalization. She does acknowledge she drank more alcohol over the winter months without being completely clear how much. She notes her headaches are better recently less frequent and less intense. She previously was splitting her clorazepate tablets 15 mg into half tabs but they would crumble so she would like to maintain 7.5 mg prescription. No recent change in bruising or bleeding and no concerns identified. No other acute concerns noted at today's visit except increased lightheadedness and congetion with spirn pollen. Flonase is helpful. Denies CP/palp/SOB/HA/congestion/fevers/GI or GU c/o. Taking meds as prescribed  Past Medical History:  Diagnosis Date  . Advanced care planning/counseling discussion 09/01/2014  . Allergic state 04/17/2013  . Anxiety   . Clotting disorder ([place])   . Depression 03-04-12   tx. meds  . Depression with anxiety 04/17/2013  . ETOH abuse   . Fibromyalgia 03-04-12   Sporadic pain  . GERD (gastroesophageal reflux disease) 08/17/2013  . History of transfusion of platelets X 1   "related to ITP"  . Hyperlipidemia, mixed 10/16/2015  . ITP (idiopathic thrombocytopenic purpura) 03-04-12   Dx.  '95- blow normal levels, but stable.  . Lipoma 03-04-12   Rt. back about scapular ares  . Migraine    "that's what the dr thinks I had yesterday; still unsure; never had one before" (06/19/2016)  . Overweight 03/06/2015  . PONV (postoperative nausea and vomiting)   . Preventative health care 04/17/2013; 08/17/2013  . Solitary pulmonary nodule 08/17/2013   LLL seen on CT in March 2014    Past Surgical  History:  Procedure Laterality Date  . APPENDECTOMY  1995  . BREAST BIOPSY Left 2012   needle biopsy(benign)-Titanium needle marker remains  . CHOLECYSTECTOMY N/A 04/24/2013   Procedure: LAPAROSCOPIC CHOLECYSTECTOMY WITH INTRAOPERATIVE CHOLANGIOGRAM;  Surgeon: Haywood Lasso, MD;  Location: [facility];  Service: General;  Laterality: N/A;  . COLONOSCOPY  2008   WNL, Dr Cristina Gong  . IRRIGATION AND DEBRIDEMENT ABSCESS  ~ 2005 X 2   buttocks  . LIPOMA EXCISION  ~ 04/2016   "base of my neck"  . MASS EXCISION  03/10/2012   Procedure: EXCISION MASS;  Surgeon: Odis Hollingshead, MD;  Location: [facility];  Service: General;  Laterality: N/A;  Removal of back lipoma  . 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 SPLENECTOMY, TOTAL  1995  . TOTAL ABDOMINAL HYSTERECTOMY  1995   total, endometriosis, fibroid, ovarian atrophy    Family History  Problem Relation Age of Onset  . Heart disease Maternal Grandmother   . Heart disease Maternal Grandfather   . Heart attack Maternal Grandfather 62  . Heart disease Paternal Grandmother   . Heart attack Paternal Grandmother 86  . Heart disease Paternal Grandfather   . Cancer Paternal Grandfather     chest- smoker  . Diabetes Mother     type 2- controlled by diet  . Atrial fibrillation Father   . Hyperlipidemia Sister   . Migraines Sister   . Depression Sister     Social History   Social  History  . Marital status: Single    Spouse name: N/A  . Number of children: 0  . Years of education: 12   Occupational History  . Not on file.   Social History Main Topics  . Smoking status: Former Smoker    Packs/day: 0.75    Years: 5.00    Types: Cigarettes    Quit date: 02/21/1983  . Smokeless tobacco: Never Used  . Alcohol use 7.2 oz/week    12 Cans of beer per week  . Drug use: No  . 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  Not on file   Social History Narrative   Lives at home w/ her roommate   Right-handed   Caffeine: has cut down to 1-2 cups 1/2 caf coffee in the a.m.    Outpatient Medications Prior to Visit  Medication Sig Dispense Refill  . acetaminophen (TYLENOL) 500 MG tablet Take 1,000 mg by mouth every 6 (six) hours as needed for pain.    Marland Kitchen ALPRAZolam (XANAX) 0.25 MG tablet TAKE 1 TABLET BY MOUTH TWICE DAILY AS NEEDED FOR SLEEP OR ANXIETY 20 tablet 2  . Calcium Carb-Cholecalciferol (CALCIUM 600 + D PO) Take 1 tablet by mouth daily.    . Cholecalciferol (VITAMIN D) 2000 units CAPS Take 2,000 Units by mouth daily.    Marland Kitchen docusate sodium (COLACE) 100 MG capsule Take 100 mg by mouth daily.    . meclizine (ANTIVERT) 25 MG tablet Take 1 tablet (25 mg total) by mouth 3 (three) times daily as needed for dizziness. 30 tablet 0  . Multiple Vitamins-Minerals (HAIR/SKIN/NAILS PO) Take 3 tablets by mouth daily.    Marland Kitchen omeprazole (PRILOSEC) 20 MG capsule TAKE ONE CAPSULE BY MOUTH DAILY 90 capsule 1  . ranitidine (ZANTAC) 150 MG tablet Take 150 mg by mouth at bedtime.    . traZODone (DESYREL) 50 MG tablet TAKE 1 & 1/2 TABLETS (75 MG TOTAL) BY MOUTH AT BEDTIME. 135 tablet 1  . benzonatate (TESSALON) 100 MG capsule Take 1 capsule (100 mg total) by mouth 3 (three) times daily as needed. 30 capsule 0  . clorazepate (TRANXENE) 15 MG tablet TAKE 1/2 TABLET BY MOUTH AT BEDTIME 45 tablet 0  . clorazepate (TRANXENE) 7.5 MG tablet TAKE 1 TABLET BY MOUTH AT BEDTIME 90 tablet 0  . FLUoxetine (PROZAC) 20 MG capsule TAKE 2 CAPSULES BY MOUTH DAILY 60 capsule 5  . fluticasone (FLONASE) 50 MCG/ACT nasal spray Place 2 sprays into both nostrils daily. 16 g 6  . topiramate (TOPAMAX) 25 MG tablet TAKE 1 TABLET (25 MG TOTAL) BY MOUTH DAILY. 30 tablet 2   No facility-administered medications prior to visit.     Allergies  Allergen Reactions  . Codeine Nausea And Vomiting    Review of Systems  Constitutional: Negative for fever and  malaise/fatigue.  HENT: Positive for congestion.   Eyes: Negative for blurred vision.  Respiratory: Negative for shortness of breath.   Cardiovascular: Negative for chest pain, palpitations and leg swelling.  Gastrointestinal: Negative for abdominal pain, blood in stool and nausea.  Genitourinary: Negative for dysuria and frequency.  Musculoskeletal: Negative for falls.  Skin: Negative for rash.  Neurological: Positive for dizziness. Negative for loss of consciousness and headaches.  Endo/Heme/Allergies: Negative for environmental allergies.  Psychiatric/Behavioral: Negative for depression. The patient is not nervous/anxious.  Objective:    Physical Exam  Constitutional: She is oriented to person, place, and time. She appears well-developed and well-nourished. No distress.  HENT:  Head: Normocephalic and atraumatic.  Nose: Nose normal.  Eyes: Right eye exhibits no discharge. Left eye exhibits no discharge.  Neck: Normal range of motion. Neck supple.  Cardiovascular: Normal rate and regular rhythm.   No murmur heard. Pulmonary/Chest: Effort normal and breath sounds normal.  Abdominal: Soft. Bowel sounds are normal. There is no tenderness.  Musculoskeletal: She exhibits no edema.  Neurological: She is alert and oriented to person, place, and time.  Skin: Skin is warm and dry.  Psychiatric: She has a normal mood and affect.  Nursing note and vitals reviewed.   BP 108/68 (BP Location: Left Arm, Patient Position: Sitting, Cuff Size: Normal)   Pulse 70   Temp 98.6 F (37 C) (Oral)   Ht 5\' 8"  (1.727 m)   Wt 185 lb (83.9 kg)   SpO2 99%   BMI 28.13 kg/m  Wt Readings from Last 3 Encounters:  03/19/17 185 lb (83.9 kg)  02/14/17 186 lb 12.8 oz (84.7 kg)  11/19/16 187 lb (84.8 kg)     Lab Results  Component Value Date   WBC 6.0 03/12/2017   HGB 13.3 03/12/2017   HCT 40.3 03/12/2017   PLT 71.0 (L) 03/12/2017   GLUCOSE 100 (H) 03/12/2017   CHOL 199 03/12/2017   TRIG  193.0 (H) 03/12/2017   HDL 57.70 03/12/2017   LDLDIRECT 86.0 03/27/2016   LDLCALC 102 (H) 03/12/2017   ALT 17 03/12/2017   AST 21 03/12/2017   NA 137 03/12/2017   K 4.7 03/12/2017   CL 104 03/12/2017   CREATININE 0.92 03/12/2017   BUN 23 03/12/2017   CO2 28 03/12/2017   TSH 1.60 03/12/2017   INR 1.02 06/19/2016   HGBA1C 4.8 06/19/2016    Lab Results  Component Value Date   TSH 1.60 03/12/2017   Lab Results  Component Value Date   WBC 6.0 03/12/2017   HGB 13.3 03/12/2017   HCT 40.3 03/12/2017   MCV 93.1 03/12/2017   PLT 71.0 (L) 03/12/2017   Lab Results  Component Value Date   NA 137 03/12/2017   K 4.7 03/12/2017   CO2 28 03/12/2017   GLUCOSE 100 (H) 03/12/2017   BUN 23 03/12/2017   CREATININE 0.92 03/12/2017   BILITOT 0.4 03/12/2017   ALKPHOS 108 03/12/2017   AST 21 03/12/2017   ALT 17 03/12/2017   PROT 8.1 03/12/2017   ALBUMIN 4.3 03/12/2017   CALCIUM 9.8 03/12/2017   ANIONGAP 7 06/19/2016   GFR 66.13 03/12/2017   Lab Results  Component Value Date   CHOL 199 03/12/2017   Lab Results  Component Value Date   HDL 57.70 03/12/2017   Lab Results  Component Value Date   LDLCALC 102 (H) 03/12/2017   Lab Results  Component Value Date   TRIG 193.0 (H) 03/12/2017   Lab Results  Component Value Date   CHOLHDL 3 03/12/2017   Lab Results  Component Value Date   HGBA1C 4.8 06/19/2016       Assessment & Plan:   Problem List Items Addressed This Visit    Allergic state    Flared with spring, refill given on Fluticasone      Relevant Orders   Comprehensive metabolic panel   CBC   GERD (gastroesophageal reflux disease)    Avoid offending foods, start probiotics. Do not eat large meals in late  evening and consider raising head of bed.      Relevant Orders   Comprehensive metabolic panel   CBC   TSH   Vertigo    Flares with spring allergies, given refill on the meclizine      Relevant Medications   fluticasone (FLONASE) 50 MCG/ACT nasal  spray   Other Relevant Orders   Comprehensive metabolic panel   CBC   Hyperlipidemia, mixed    Encouraged heart healthy diet, increase exercise, avoid trans fats, consider a krill oil cap daily      Relevant Orders   Lipid panel   ETOH abuse    She acknowledges drinking more over the winter, she is reminded 1-2 beverages daily is the most a woman can consume safely and that alcohol is a sugar so can increase the triglycerides. She is encouraged to cut back or stop      Chronic migraine    Encouraged increased hydration, 64 ounces of clear fluids daily. Minimize alcohol and caffeine. Eat small frequent meals with lean proteins and complex carbs. Avoid high and low blood sugars. Get adequate sleep, 7-8 hours a night. Needs exercise daily preferably in the morning.      Relevant Medications   FLUoxetine (PROZAC) 20 MG capsule   topiramate (TOPAMAX) 25 MG tablet      I have discontinued Ms. Rakes's clorazepate and benzonatate. I have also changed her FLUoxetine and clorazepate. Additionally, I am having her maintain her acetaminophen, ranitidine, Multiple Vitamins-Minerals (HAIR/SKIN/NAILS PO), Calcium Carb-Cholecalciferol (CALCIUM 600 + D PO), docusate sodium, Vitamin D, ALPRAZolam, meclizine, omeprazole, traZODone, topiramate, and fluticasone.  Meds ordered this encounter  Medications  . FLUoxetine (PROZAC) 20 MG capsule    Sig: Take 2 capsules (40 mg total) by mouth daily.    Dispense:  180 capsule    Refill:  1  . clorazepate (TRANXENE) 7.5 MG tablet    Sig: Take 1 tablet (7.5 mg total) by mouth at bedtime.    Dispense:  90 tablet    Refill:  1  . topiramate (TOPAMAX) 25 MG tablet    Sig: Take 1 tablet (25 mg total) by mouth daily.    Dispense:  90 tablet    Refill:  1  . fluticasone (FLONASE) 50 MCG/ACT nasal spray    Sig: Place 2 sprays into both nostrils daily.    Dispense:  16 g    Refill:  6    CMA served as scribe during this visit. History, Physical and Plan  performed by medical provider. Documentation and orders reviewed and attested to.  Penni Homans, MD

## 2017-03-19 NOTE — Patient Instructions (Signed)
Dizziness Dizziness is a common problem. It is a feeling of unsteadiness or light-headedness. You may feel like you are about to faint. Dizziness can lead to injury if you stumble or fall. Anyone can become dizzy, but dizziness is more common in older adults. This condition can be caused by a number of things, including medicines, dehydration, or illness. Follow these instructions at home: Taking these steps may help with your condition: Eating and drinking   Drink enough fluid to keep your urine clear or pale yellow. This helps to keep you from becoming dehydrated. Try to drink more clear fluids, such as water.  Do not drink alcohol.  Limit your caffeine intake if directed by your health care provider.  Limit your salt intake if directed by your health care provider. Activity   Avoid making quick movements.  Rise slowly from chairs and steady yourself until you feel okay.  In the morning, first sit up on the side of the bed. When you feel okay, stand slowly while you hold onto something until you know that your balance is fine.  Move your legs often if you need to stand in one place for a long time. Tighten and relax your muscles in your legs while you are standing.  Do not drive or operate heavy machinery if you feel dizzy.  Avoid bending down if you feel dizzy. Place items in your home so that they are easy for you to reach without leaning over. Lifestyle   Do not use any tobacco products, including cigarettes, chewing tobacco, or electronic cigarettes. If you need help quitting, ask your health care provider.  Try to reduce your stress level, such as with yoga or meditation. Talk with your health care provider if you need help. General instructions   Watch your dizziness for any changes.  Take medicines only as directed by your health care provider. Talk with your health care provider if you think that your dizziness is caused by a medicine that you are taking.  Tell a friend  or a family member that you are feeling dizzy. If he or she notices any changes in your behavior, have this person call your health care provider.  Keep all follow-up visits as directed by your health care provider. This is important. Contact a health care provider if:  Your dizziness does not go away.  Your dizziness or light-headedness gets worse.  You feel nauseous.  You have reduced hearing.  You have new symptoms.  You are unsteady on your feet or you feel like the room is spinning. Get help right away if:  You vomit or have diarrhea and are unable to eat or drink anything.  You have problems talking, walking, swallowing, or using your arms, hands, or legs.  You feel generally weak.  You are not thinking clearly or you have trouble forming sentences. It may take a friend or family member to notice this.  You have chest pain, abdominal pain, shortness of breath, or sweating.  Your vision changes.  You notice any bleeding.  You have a headache.  You have neck pain or a stiff neck.  You have a fever. This information is not intended to replace advice given to you by your health care provider. Make sure you discuss any questions you have with your health care provider. Document Released: 05/15/2001 Document Revised: 04/26/2016 Document Reviewed: 11/15/2014 Elsevier Interactive Patient Education  2017 [employer] Inc[employer]  

## 2017-03-19 NOTE — Progress Notes (Signed)
Pre visit review using our clinic review tool, if applicable. No additional management support is needed unless otherwise documented below in the visit note. 

## 2017-03-19 NOTE — Assessment & Plan Note (Signed)
Encouraged increased hydration, 64 ounces of clear fluids daily. Minimize alcohol and caffeine. Eat small frequent meals with lean proteins and complex carbs. Avoid high and low blood sugars. Get adequate sleep, 7-8 hours a night. Needs exercise daily preferably in the morning.  

## 2017-03-19 NOTE — Assessment & Plan Note (Signed)
Avoid offending foods, start probiotics. Do not eat large meals in late evening and consider raising head of bed.  

## 2017-03-19 NOTE — Assessment & Plan Note (Signed)
Flares with spring allergies, given refill on the meclizine

## 2017-03-19 NOTE — Assessment & Plan Note (Signed)
Flared with spring, refill given on Fluticasone

## 2017-04-08 MED FILL — TOPIRAMATE 25 MG TABLET: 25 | 30 days supply | Qty: 30 | Fill #1

## 2017-04-09 ENCOUNTER — Ambulatory Visit: Payer: BLUE CROSS/BLUE SHIELD | Admitting: Family Medicine

## 2017-04-19 MED FILL — FLUoxetine HCL 20 MG CAPS: 20 | 30 days supply | Qty: 60 | Fill #4

## 2017-04-19 MED FILL — OMEPRAZOLE DR 20 MG CAPSULE: 20 | 90 days supply | Qty: 90 | Fill #1

## 2017-05-10 MED FILL — TOPIRAMATE 25 MG TABLET: 25 | 30 days supply | Qty: 30 | Fill #2

## 2017-05-20 MED FILL — FLUoxetine HCL 20 MG CAPS: 20 | 30 days supply | Qty: 60 | Fill #5

## 2017-06-10 MED FILL — TOPIRAMATE 25 MG TABLET: 25 | 90 days supply | Qty: 90 | Fill #0

## 2017-06-10 MED FILL — traZODone HCL 50 MG TABS: 50 | 90 days supply | Qty: 135 | Fill #1

## 2017-06-20 MED FILL — CLORAZEPATE 7.5 MG TABLET: 7.5 | 90 days supply | Qty: 90 | Fill #1

## 2017-06-20 MED FILL — FLUoxetine HCL 20 MG CAPS: 20 | 90 days supply | Qty: 180 | Fill #0

## 2017-08-12 ENCOUNTER — Other Ambulatory Visit: Payer: Self-pay | Admitting: Family Medicine

## 2017-08-12 MED FILL — OMEPRAZOLE 20 MG CAP: 20 | 90 days supply | Qty: 90 | Fill #0

## 2017-09-03 ENCOUNTER — Other Ambulatory Visit: Payer: Self-pay | Admitting: Family Medicine

## 2017-09-03 ENCOUNTER — Ambulatory Visit (INDEPENDENT_AMBULATORY_CARE_PROVIDER_SITE_OTHER): Payer: BLUE CROSS/BLUE SHIELD | Admitting: Medical

## 2017-09-03 ENCOUNTER — Encounter: Payer: Self-pay | Admitting: Medical

## 2017-09-03 VITALS — BP 122/66 | HR 62 | Temp 98.3°F | Ht 68.0 in | Wt 183.2 lb

## 2017-09-03 DIAGNOSIS — G4700 Insomnia, unspecified: Secondary | ICD-10-CM

## 2017-09-03 DIAGNOSIS — J301 Allergic rhinitis due to pollen: Secondary | ICD-10-CM | POA: Diagnosis not present

## 2017-09-03 DIAGNOSIS — H9202 Otalgia, left ear: Secondary | ICD-10-CM

## 2017-09-03 DIAGNOSIS — J3489 Other specified disorders of nose and nasal sinuses: Secondary | ICD-10-CM | POA: Diagnosis not present

## 2017-09-03 DIAGNOSIS — Z23 Encounter for immunization: Secondary | ICD-10-CM

## 2017-09-03 MED ORDER — FAMCICLOVIR 500 MG PO TABS: 500.0000 mg | ORAL_TABLET | Freq: Three times a day (TID) | ORAL | 0 refills | Status: DC

## 2017-09-03 MED ORDER — AMOXICILLIN-POT CLAVULANATE 875-125 MG PO TABS: 1.0000 | ORAL_TABLET | Freq: Two times a day (BID) | ORAL | 0 refills | Status: DC

## 2017-09-03 MED ORDER — BENZONATATE 100 MG PO CAPS: 100.0000 mg | ORAL_CAPSULE | Freq: Three times a day (TID) | ORAL | 0 refills | Status: DC | PRN

## 2017-09-03 MED ORDER — TRAZODONE HCL 50 MG PO TABS: ORAL_TABLET | ORAL | 1 refills | Status: DC

## 2017-09-03 MED FILL — TOPIRAMATE 25 MG TAB: 25 | 90 days supply | Qty: 90 | Fill #1

## 2017-09-03 MED FILL — BENZONATATE 100 MG CAP: 100 | 7 days supply | Qty: 21 | Fill #0

## 2017-09-03 MED FILL — AMOX-CLAV 875-125 MG TABLET: 875-125 | 10 days supply | Qty: 20 | Fill #0

## 2017-09-03 MED FILL — traZODone HCL 50 MG TABS: 50 | 90 days supply | Qty: 135 | Fill #0

## 2017-09-03 NOTE — Progress Notes (Signed)
Subjective:    [name], female    DOB: 03/12/1956, 61 y.o.   MRN: [id number]  HPI    Pt in reporting some left ear pain recently last couple of days.  Feels like pressure in ear. Some left side faint cheek/sinus region pain. Some nasal congested in morning when she first wakes up last 3 days. Occasional sneezing.  No fevers, no chills or sweats.   Pt has hx of vertigo. She states over past month vertigo has improved. Describes chronic vertigo and improved with head maneuver exercises.       Review of Systems  Constitutional: Negative for chills, fatigue and fever.  HENT: Positive for congestion, ear pain and sneezing. Negative for hearing loss, nosebleeds and postnasal drip.   Eyes: Negative for photophobia, pain and visual disturbance.  Respiratory: Positive for cough. Negative for choking and wheezing.        Minimal   Cardiovascular: Negative for chest pain and palpitations.  Gastrointestinal: Negative for abdominal pain.  Musculoskeletal: Negative for back pain.  Neurological: Negative for dizziness, seizures, weakness and headaches.       History of vertigo. Pt describes stable and responds to head maneuver exercises.  Hematological: Negative for adenopathy. Does not bruise/bleed easily.  Psychiatric/Behavioral: Negative for behavioral problems, confusion and suicidal ideas. The patient is not nervous/anxious.     Past Medical History:  Diagnosis Date  . Advanced care planning/counseling discussion 09/01/2014  . Allergic state 04/17/2013  . Anxiety   . Clotting disorder ([place])   . Depression 03-04-12   tx. meds  . Depression with anxiety 04/17/2013  . ETOH abuse   . Fibromyalgia 03-04-12   Sporadic pain  . GERD (gastroesophageal reflux disease) 08/17/2013  . History of transfusion of platelets X 1   "related to ITP"  . Hyperlipidemia, mixed 10/16/2015  . ITP (idiopathic thrombocytopenic purpura) 03-04-12   Dx.  '95- blow normal levels, but stable.    . Lipoma 03-04-12   Rt. back about scapular ares  . Migraine    "that's what the dr thinks I had yesterday; still unsure; never had one before" (06/19/2016)  . Overweight 03/06/2015  . PONV (postoperative nausea and vomiting)   . Preventative health care 04/17/2013; 08/17/2013  . Solitary pulmonary nodule 08/17/2013   LLL seen on CT in March 2014     Social History   Social History  . Marital status: Single    Spouse name: N/A  . Number of children: 0  . Years of education: 12   Occupational History  . Not on file.   Social History Main Topics  . Smoking status: Former Smoker    Packs/day: 0.75    Years: 5.00    Types: Cigarettes    Quit date: 02/21/1983  . Smokeless tobacco: Never Used  . Alcohol use 7.2 oz/week    12 Cans of beer per week  . Drug use: No  . 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 Not on file   Social History Narrative   Lives at home w/ her roommate   Right-handed   Caffeine: has cut down to 1-2 cups 1/2 caf coffee in the a.m.    Past Surgical History:  Procedure Laterality Date  . APPENDECTOMY  1995  . BREAST BIOPSY Left 2012   needle biopsy(benign)-Titanium needle marker remains  . CHOLECYSTECTOMY  N/A 04/24/2013   Procedure: LAPAROSCOPIC CHOLECYSTECTOMY WITH INTRAOPERATIVE CHOLANGIOGRAM;  Surgeon: Haywood Lasso, MD;  Location: [facility];  Service: General;  Laterality: N/A;  . COLONOSCOPY  2008   WNL, Dr Cristina Gong  . IRRIGATION AND DEBRIDEMENT ABSCESS  ~ 2005 X 2   buttocks  . LIPOMA EXCISION  ~ 04/2016   "base of my neck"  . MASS EXCISION  03/10/2012   Procedure: EXCISION MASS;  Surgeon: Odis Hollingshead, MD;  Location: [facility];  Service: General;  Laterality: N/A;  Removal of back lipoma  . 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 SPLENECTOMY, TOTAL  1995  . TOTAL ABDOMINAL HYSTERECTOMY  1995   total, endometriosis,  fibroid, ovarian atrophy    Family History  Problem Relation Age of Onset  . Heart disease Maternal Grandmother   . Heart disease Maternal Grandfather   . Heart attack Maternal Grandfather 62  . Heart disease Paternal Grandmother   . Heart attack Paternal Grandmother 86  . Heart disease Paternal Grandfather   . Cancer Paternal Grandfather        chest- smoker  . Diabetes Mother        type 2- controlled by diet  . Atrial fibrillation Father   . Hyperlipidemia Sister   . Migraines Sister   . Depression Sister     Allergies  Allergen Reactions  . Codeine Nausea And Vomiting    Current Outpatient Prescriptions on File Prior to Visit  Medication Sig Dispense Refill  . acetaminophen (TYLENOL) 500 MG tablet Take 1,000 mg by mouth every 6 (six) hours as needed for pain.    Marland Kitchen ALPRAZolam (XANAX) 0.25 MG tablet TAKE 1 TABLET BY MOUTH TWICE DAILY AS NEEDED FOR SLEEP OR ANXIETY 20 tablet 2  . Calcium Carb-Cholecalciferol (CALCIUM 600 + D PO) Take 1 tablet by mouth daily.    . Cholecalciferol (VITAMIN D) 2000 units CAPS Take 2,000 Units by mouth daily.    . clorazepate (TRANXENE) 7.5 MG tablet Take 1 tablet (7.5 mg total) by mouth at bedtime. 90 tablet 1  . docusate sodium (COLACE) 100 MG capsule Take 100 mg by mouth daily.    Marland Kitchen FLUoxetine (PROZAC) 20 MG capsule Take 2 capsules (40 mg total) by mouth daily. 180 capsule 1  . fluticasone (FLONASE) 50 MCG/ACT nasal spray Place 2 sprays into both nostrils daily. 16 g 6  . meclizine (ANTIVERT) 25 MG tablet Take 1 tablet (25 mg total) by mouth 3 (three) times daily as needed for dizziness. 30 tablet 0  . Multiple Vitamins-Minerals (HAIR/SKIN/NAILS PO) Take 3 tablets by mouth daily.    Marland Kitchen omeprazole (PRILOSEC) 20 MG capsule TAKE ONE CAPSULE BY MOUTH DAILY 90 capsule 1  . ranitidine (ZANTAC) 150 MG tablet Take 150 mg by mouth at bedtime.    . topiramate (TOPAMAX) 25 MG tablet Take 1 tablet (25 mg total) by mouth daily. 90 tablet 1   No current  facility-administered medications on file prior to visit.     BP 122/66 (BP Location: Right Arm, Patient Position: Sitting, Cuff Size: Normal)   Pulse 62   Temp 98.3 F (36.8 C) (Oral)   Ht 5\' 8"  (1.727 m)   Wt 183 lb 3.2 oz (83.1 kg)   SpO2 98%   BMI 27.86 kg/m        Objective:   Physical Exam   General  Mental Status - Alert. General Appearance - Well groomed. Not in acute distress.  Skin Rashes-  No Rashes.  HEENT Head- Normal. Ear Auditory Canal - Left- Normal. Right - Normal.Tympanic Membrane- Left- small area of central redness on tm. Right- Normal. Eye Sclera/Conjunctiva- Left- Normal. Right- Normal. Nose & Sinuses Nasal Mucosa- Left-  Boggy and Congested. Right-  Boggy and  Congested.Bilateral left sided  Maxillary tender but no frontal sinus pressure. Mouth & Throat Lips: Upper Lip- Normal: no dryness, cracking, pallor, cyanosis, or vesicular eruption. Lower Lip-Normal: no dryness, cracking, pallor, cyanosis or vesicular eruption. Buccal Mucosa- Bilateral- No Aphthous ulcers. Oropharynx- No Discharge or Erythema. Tonsils: Characteristics- Bilateral- No Erythema or Congestion. Size/Enlargement- Bilateral- No enlargement. Discharge- bilateral-None.  Neck Neck- Supple. No Masses.   Chest and Lung Exam Auscultation: Breath Sounds:-Clear even and unlabored.  Cardiovascular Auscultation:Rythm- Regular, rate and rhythm. Murmurs & Other Heart Sounds:Ausculatation of the heart reveal- No Murmurs.  Lymphatic Head & Neck General Head & Neck Lymphatics: Bilateral: Description- No Localized lymphadenopathy.  Skin- faint tender behind left ear and one small tender follicle found in scalp behind and above the ear.       Assessment & Plan:  For your recent nasal congestion,  I do think you have mild allergic rhinitis so recommend you restart your Flonase.  You have recent left side maxillary region tenderness and left tympanic membrane looks mildly red. Will  treat with Augmentin antibiotic for early sinus infection and early ear infection.  If you develop worsening cough then start benzonatate. That was called to your pharmacy.  For history of insomnia, I refilled your trazodone.  You do have one small follicle inflamed in scalp region behind your left ear. Augmentin should resolve the inflammation of that follicle.  Since you are heading out of  town and part of your sinus region pain is over your left cheek, I am going to make Famvir antiviral available in the event you have any skin outbreak that could indicate early atypical shingles. Printed prescription given but advise not to start  unless described skin rash were to occur.  Flu vaccine given today.  Follow-up in 10-14 days or as needed.  Quincey Quesinberry, Percell Miller, PA-C

## 2017-09-03 NOTE — Patient Instructions (Addendum)
For your recent nasal congestion,  I do think you have mild allergic rhinitis so recommend you restart your Flonase.  You have recent left side maxillary region tenderness and left tympanic membrane looks mildly red. Will treat with Augmentin antibiotic for early sinus infection and early ear infection.  If you develop worsening cough then start benzonatate. That was called to your pharmacy.  For history of insomnia, I refilled your trazodone.  You do have one small follicle inflamed in scalp region behind your left ear. Augmentin should resolve the inflammation of that follicle.  Since you are heading out of  town and part of your sinus region pain is over your left cheek, I am going to make Famvir antiviral available in the event you have any skin outbreak that could indicate early atypical shingles. Printed prescription given but advise not to start unless described skin rash were to occur.  Flu vaccine given today.  Follow-up in 10-14 days or as needed.

## 2017-09-30 MED FILL — FLUoxetine HCL 20 MG CAPS: 20 | 90 days supply | Qty: 180 | Fill #1

## 2017-10-29 ENCOUNTER — Encounter: Payer: BLUE CROSS/BLUE SHIELD | Admitting: Family Medicine

## 2017-10-31 ENCOUNTER — Telehealth: Payer: Self-pay | Admitting: Family Medicine

## 2017-10-31 MED ORDER — CLORAZEPATE DIPOTASSIUM 7.5 MG PO TABS: 7.5000 mg | ORAL_TABLET | Freq: Every day | ORAL | 0 refills | Status: DC

## 2017-10-31 MED FILL — CLORAZEPATE 7.5 MG TABLET: 7.5 | 90 days supply | Qty: 90 | Fill #0

## 2017-10-31 NOTE — Telephone Encounter (Signed)
Copied from [place]. Topic: General - Other >> Oct 31, 2017 12:05 PM Oneta Rack wrote: [place], [place] - [address] [phone] (Phone) [phone] (Fax)     Suezanne Jacquet from the pharmacy called requesting clorazepate (TRANXENE) 7.5 MG tablet , please advise

## 2017-10-31 NOTE — Telephone Encounter (Signed)
Spoke w/ Suezanne Jacquet, verbal given. #[phone] and 0RF.

## 2017-11-05 ENCOUNTER — Other Ambulatory Visit (INDEPENDENT_AMBULATORY_CARE_PROVIDER_SITE_OTHER): Payer: BLUE CROSS/BLUE SHIELD

## 2017-11-05 DIAGNOSIS — T7840XA Allergy, unspecified, initial encounter: Secondary | ICD-10-CM | POA: Diagnosis not present

## 2017-11-05 DIAGNOSIS — K219 Gastro-esophageal reflux disease without esophagitis: Secondary | ICD-10-CM | POA: Diagnosis not present

## 2017-11-05 DIAGNOSIS — E782 Mixed hyperlipidemia: Secondary | ICD-10-CM

## 2017-11-05 DIAGNOSIS — R42 Dizziness and giddiness: Secondary | ICD-10-CM | POA: Diagnosis not present

## 2017-11-05 LAB — COMPREHENSIVE METABOLIC PANEL
ALBUMIN: 4.3 g/dL (ref 3.5–5.2)
ALT: 14 U/L (ref 0–35)
AST: 22 U/L (ref 0–37)
Alkaline Phosphatase: 108 U/L (ref 39–117)
BUN: 25 mg/dL — AB (ref 6–23)
CHLORIDE: 103 meq/L (ref 96–112)
CO2: 27 meq/L (ref 19–32)
CREATININE: 0.83 mg/dL (ref 0.40–1.20)
Calcium: 9.6 mg/dL (ref 8.4–10.5)
GFR: 74.31 mL/min (ref >60.00)
Glucose, Bld: 101 mg/dL — ABNORMAL HIGH (ref 70–99)
POTASSIUM: 4 meq/L (ref 3.5–5.1)
SODIUM: 136 meq/L (ref 135–145)
Total Bilirubin: 0.4 mg/dL (ref 0.2–1.2)
Total Protein: 8.1 g/dL (ref 6.0–8.3)

## 2017-11-05 LAB — LIPID PANEL
CHOLESTEROL: 214 mg/dL — AB (ref 0–200)
HDL: 60.8 mg/dL (ref >39.00)
NonHDL: 153.12
Total CHOL/HDL Ratio: 4
Triglycerides: 222 mg/dL — ABNORMAL HIGH (ref 0.0–149.0)
VLDL: 44.4 mg/dL — ABNORMAL HIGH (ref 0.0–40.0)

## 2017-11-05 LAB — TSH: TSH: 1.84 u[IU]/mL (ref 0.35–4.50)

## 2017-11-05 LAB — CBC
HEMATOCRIT: 39.9 % (ref 36.0–46.0)
Hemoglobin: 13.2 g/dL (ref 12.0–15.0)
MCHC: 33 g/dL (ref 30.0–36.0)
MCV: 95.1 fl (ref 78.0–100.0)
Platelets: 73 10*3/uL — ABNORMAL LOW (ref 150.0–400.0)
RBC: 4.19 Mil/uL (ref 3.87–5.11)
RDW: 13.1 % (ref 11.5–15.5)
WBC: 6.6 10*3/uL (ref 4.0–10.5)

## 2017-11-05 LAB — LDL CHOLESTEROL, DIRECT: LDL DIRECT: 110 mg/dL

## 2017-11-12 ENCOUNTER — Encounter: Payer: BLUE CROSS/BLUE SHIELD | Admitting: Family Medicine

## 2017-12-09 ENCOUNTER — Other Ambulatory Visit: Payer: Self-pay | Admitting: Family Medicine

## 2017-12-09 MED FILL — TOPIRAMATE 25 MG TAB: 25 | 90 days supply | Qty: 90 | Fill #0

## 2017-12-09 MED FILL — OMEPRAZOLE 20 MG CAP: 20 | 90 days supply | Qty: 90 | Fill #1

## 2017-12-10 MED FILL — traZODone HCL 50 MG TABS: 50 | 90 days supply | Qty: 135 | Fill #1

## 2018-01-10 ENCOUNTER — Other Ambulatory Visit: Payer: Self-pay | Admitting: Family Medicine

## 2018-01-10 MED FILL — FLUoxetine HCL 20 MG CAPS: 20 | 90 days supply | Qty: 180 | Fill #0

## 2018-01-17 ENCOUNTER — Encounter: Payer: Self-pay | Admitting: Family Medicine

## 2018-01-17 ENCOUNTER — Ambulatory Visit (INDEPENDENT_AMBULATORY_CARE_PROVIDER_SITE_OTHER): Payer: BLUE CROSS/BLUE SHIELD | Admitting: Family Medicine

## 2018-01-17 VITALS — BP 116/70 | HR 70 | Temp 98.4°F | Resp 16 | Ht 68.0 in | Wt 191.2 lb

## 2018-01-17 DIAGNOSIS — Z1231 Encounter for screening mammogram for malignant neoplasm of breast: Secondary | ICD-10-CM

## 2018-01-17 DIAGNOSIS — H8143 Vertigo of central origin, bilateral: Secondary | ICD-10-CM | POA: Diagnosis not present

## 2018-01-17 DIAGNOSIS — E2839 Other primary ovarian failure: Secondary | ICD-10-CM

## 2018-01-17 DIAGNOSIS — Z0000 Encounter for general adult medical examination without abnormal findings: Secondary | ICD-10-CM

## 2018-01-17 DIAGNOSIS — IMO0001 Reserved for inherently not codable concepts without codable children: Secondary | ICD-10-CM

## 2018-01-17 DIAGNOSIS — M8580 Other specified disorders of bone density and structure, unspecified site: Secondary | ICD-10-CM | POA: Diagnosis not present

## 2018-01-17 DIAGNOSIS — E782 Mixed hyperlipidemia: Secondary | ICD-10-CM

## 2018-01-17 DIAGNOSIS — R42 Dizziness and giddiness: Secondary | ICD-10-CM

## 2018-01-17 DIAGNOSIS — Z1239 Encounter for other screening for malignant neoplasm of breast: Secondary | ICD-10-CM

## 2018-01-17 MED ORDER — HYDROCORTISONE ACETATE 25 MG RE SUPP: 25.0000 mg | Freq: Two times a day (BID) | RECTAL | 2 refills | Status: DC

## 2018-01-17 MED FILL — HEMMOREX-HC 25 MG SUPP: 25 | 6 days supply | Qty: 12 | Fill #0

## 2018-01-17 NOTE — Patient Instructions (Addendum)
Encouraged increased hydration and fiber in diet. Daily probiotics. If bowels not moving can use MOM 2 tbls po in 4 oz of warm prune juice by mouth every 2-3 days. If no results then repeat in 4 hours with  Dulcolax suppository pr, may repeat again in 4 more hours as needed. Seek care if symptoms worsen. Consider daily Miralax and/or Dulcolax if symptoms persist.   Shingrix is the new shingles, 2 shots over 2-6 months, check with insurance regarding then can get at  Pharmacy or here  Preventive Care 40-64 Years, Female Preventive care refers to lifestyle choices and visits with your health care provider that can promote health and wellness. What does preventive care include?  A yearly physical exam. This is also called an annual well check.  Dental exams once or twice a year.  Routine eye exams. Ask your health care provider how often you should have your eyes checked.  Personal lifestyle choices, including: ? Daily care of your teeth and gums. ? Regular physical activity. ? Eating a healthy diet. ? Avoiding tobacco and drug use. ? Limiting alcohol use. ? Practicing safe sex. ? Taking low-dose aspirin daily starting at age 64. ? Taking vitamin and mineral supplements as recommended by your health care provider. What happens during an annual well check? The services and screenings done by your health care provider during your annual well check will depend on your age, overall health, lifestyle risk factors, and family history of disease. Counseling Your health care provider may ask you questions about your:  Alcohol use.  Tobacco use.  Drug use.  Emotional well-being.  Home and relationship well-being.  Sexual activity.  Eating habits.  Work and work Statistician.  Method of birth control.  Menstrual cycle.  Pregnancy history.  Screening You may have the following tests or measurements:  Height, weight, and BMI.  Blood pressure.  Lipid and cholesterol levels.  These may be checked every 5 years, or more frequently if you are over 82 years old.  Skin check.  Lung cancer screening. You may have this screening every year starting at age 28 if you have a 30-pack-year history of smoking and currently smoke or have quit within the past 15 years.  Fecal occult blood test (FOBT) of the stool. You may have this test every year starting at age 43.  Flexible sigmoidoscopy or colonoscopy. You may have a sigmoidoscopy every 5 years or a colonoscopy every 10 years starting at age 43.  Hepatitis C blood test.  Hepatitis B blood test.  Sexually transmitted disease (STD) testing.  Diabetes screening. This is done by checking your blood sugar (glucose) after you have not eaten for a while (fasting). You may have this done every 1-3 years.  Mammogram. This may be done every 1-2 years. Talk to your health care provider about when you should start having regular mammograms. This may depend on whether you have a family history of breast cancer.  BRCA-related cancer screening. This may be done if you have a family history of breast, ovarian, tubal, or peritoneal cancers.  Pelvic exam and Pap test. This may be done every 3 years starting at age 68. Starting at age 61, this may be done every 5 years if you have a Pap test in combination with an HPV test.  Bone density scan. This is done to screen for osteoporosis. You may have this scan if you are at high risk for osteoporosis.  Discuss your test results, treatment options, and if necessary,  the need for more tests with your health care provider. Vaccines Your health care provider may recommend certain vaccines, such as:  Influenza vaccine. This is recommended every year.  Tetanus, diphtheria, and acellular pertussis (Tdap, Td) vaccine. You may need a Td booster every 10 years.  Varicella vaccine. You may need this if you have not been vaccinated.  Zoster vaccine. You may need this after age 19.  Measles,  mumps, and rubella (MMR) vaccine. You may need at least one dose of MMR if you were born in 1957 or later. You may also need a second dose.  Pneumococcal 13-valent conjugate (PCV13) vaccine. You may need this if you have certain conditions and were not previously vaccinated.  Pneumococcal polysaccharide (PPSV23) vaccine. You may need one or two doses if you smoke cigarettes or if you have certain conditions.  Meningococcal vaccine. You may need this if you have certain conditions.  Hepatitis A vaccine. You may need this if you have certain conditions or if you travel or work in places where you may be exposed to hepatitis A.  Hepatitis B vaccine. You may need this if you have certain conditions or if you travel or work in places where you may be exposed to hepatitis B.  Haemophilus influenzae type b (Hib) vaccine. You may need this if you have certain conditions.  Talk to your health care provider about which screenings and vaccines you need and how often you need them. This information is not intended to replace advice given to you by your health care provider. Make sure you discuss any questions you have with your health care provider. Document Released: 12/16/2015 Document Revised: 08/08/2016 Document Reviewed: 09/20/2015 Elsevier Interactive Patient Education  [employer].

## 2018-01-17 NOTE — Assessment & Plan Note (Signed)
Patient encouraged to maintain heart healthy diet, regular exercise, adequate sleep. Consider daily probiotics. Take medications as prescribed 

## 2018-01-17 NOTE — Assessment & Plan Note (Addendum)
Worsening and worsening with movement especially with hyperflexion and hyperextension of neck. Reminded to hydrate well and report worsening symptoms has an appt with neurology good

## 2018-01-17 NOTE — Progress Notes (Signed)
Subjective:  I acted as a Education administrator for [employer]. Yancey Flemings, [place]   [name], female    DOB: 14-Mar-1956, 62 y.o.   MRN: [id number]  Chief Complaint  Patient presents with  . Annual Exam    HPI  Patient is in today for annual exam and overall she is notably she feels well. She has had a flare in vertigo lately but no falls, congestion, headache  No falls. She notes if she looks up or down for a period of time  She does well with activities of daily living. No polyuria, febrile illness or hospitalizations. Denies CP/palp/SOB/HA/congestion/fevers/GI or GU c/o. Taking meds as prescribed  Patient Care Team: [name], MD as PCP - General (Family Medicine) Ronald Lobo, MD as Consulting Physician (Gastroenterology) Clent Jacks, MD as Consulting Physician (Ophthalmology)   Past Medical History:  Diagnosis Date  . Advanced care planning/counseling discussion 09/01/2014  . Allergic state 04/17/2013  . Anxiety   . Clotting disorder ([place])   . Depression 03-04-12   tx. meds  . Depression with anxiety 04/17/2013  . ETOH abuse   . Fibromyalgia 03-04-12   Sporadic pain  . GERD (gastroesophageal reflux disease) 08/17/2013  . History of transfusion of platelets X 1   "related to ITP"  . Hyperlipidemia, mixed 10/16/2015  . ITP (idiopathic thrombocytopenic purpura) 03-04-12   Dx.  '95- blow normal levels, but stable.  . Lipoma 03-04-12   Rt. back about scapular ares  . Migraine    "that's what the dr thinks I had yesterday; still unsure; never had one before" (06/19/2016)  . Overweight 03/06/2015  . PONV (postoperative nausea and vomiting)   . Preventative health care 04/17/2013; 08/17/2013  . Solitary pulmonary nodule 08/17/2013   LLL seen on CT in March 2014    Past Surgical History:  Procedure Laterality Date  . APPENDECTOMY  1995  . BREAST BIOPSY Left 2012   needle biopsy(benign)-Titanium needle marker remains  . CHOLECYSTECTOMY N/A 04/24/2013   Procedure: LAPAROSCOPIC  CHOLECYSTECTOMY WITH INTRAOPERATIVE CHOLANGIOGRAM;  Surgeon: Haywood Lasso, MD;  Location: [facility];  Service: General;  Laterality: N/A;  . COLONOSCOPY  2008   WNL, Dr Cristina Gong  . IRRIGATION AND DEBRIDEMENT ABSCESS  ~ 2005 X 2   buttocks  . LIPOMA EXCISION  ~ 04/2016   "base of my neck"  . MASS EXCISION  03/10/2012   Procedure: EXCISION MASS;  Surgeon: Odis Hollingshead, MD;  Location: [facility];  Service: General;  Laterality: N/A;  Removal of back lipoma  . 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 SPLENECTOMY, TOTAL  1995  . TOTAL ABDOMINAL HYSTERECTOMY  1995   total, endometriosis, fibroid, ovarian atrophy    Family History  Problem Relation Age of Onset  . Heart disease Maternal Grandmother   . Heart disease Maternal Grandfather   . Heart attack Maternal Grandfather 62  . Heart disease Paternal Grandmother   . Heart attack Paternal Grandmother 86  . Heart disease Paternal Grandfather   . Cancer Paternal Grandfather        chest- smoker  . Diabetes Mother        type 2- controlled by diet  . Atrial fibrillation Father   . Hyperlipidemia Sister   . Migraines Sister   . Cancer Sister        pancreatic  . Depression Sister   . COPD Sister     Social History   Socioeconomic History  .  Marital status: Single    Spouse name: Not on file  . Number of children: 0  . Years of education: 50  . Highest education level: Not on file  Social Needs  . Financial resource strain: Not on file  . Food insecurity - worry: Not on file  . Food insecurity - inability: Not on file  . Transportation needs - medical: Not on file  . Transportation needs - non-medical: Not on file  Occupational History  . Not on file  Tobacco Use  . Smoking status: Former Smoker    Packs/day: 0.75    Years: 5.00    Pack years: 3.75    Types: Cigarettes    Last attempt to quit: 02/21/1983    Years since quitting: 34.9  . Smokeless tobacco: Never Used  Substance and Sexual Activity  .  Alcohol use: Yes    Alcohol/week: 7.2 oz    Types: 12 Cans of beer per week  . Drug use: No  . Sexual activity: No    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 lives with partner   Other Topics Concern  . Not on file  Social History Narrative   Lives at home w/ her roommate   Right-handed   Caffeine: has cut down to 1-2 cups 1/2 caf coffee in the a.m.    Outpatient Medications Prior to Visit  Medication Sig Dispense Refill  . acetaminophen (TYLENOL) 500 MG tablet Take 1,000 mg by mouth every 6 (six) hours as needed for pain.    Marland Kitchen ALPRAZolam (XANAX) 0.25 MG tablet TAKE 1 TABLET BY MOUTH TWICE DAILY AS NEEDED FOR SLEEP OR ANXIETY 20 tablet 2  . amoxicillin-clavulanate (AUGMENTIN) 875-125 MG tablet Take 1 tablet by mouth 2 (two) times daily. 20 tablet 0  . benzonatate (TESSALON) 100 MG capsule Take 1 capsule (100 mg total) by mouth 3 (three) times daily as needed for cough. 21 capsule 0  . Calcium Carb-Cholecalciferol (CALCIUM 600 + D PO) Take 1 tablet by mouth daily.    . Cholecalciferol (VITAMIN D) 2000 units CAPS Take 2,000 Units by mouth daily.    . clorazepate (TRANXENE) 7.5 MG tablet Take 1 tablet (7.5 mg total) by mouth at bedtime. 90 tablet 0  . docusate sodium (COLACE) 100 MG capsule Take 100 mg by mouth daily.    . famciclovir (FAMVIR) 500 MG tablet Take 1 tablet (500 mg total) by mouth 3 (three) times daily. 21 tablet 0  . FLUoxetine (PROZAC) 20 MG capsule TAKE 2 CAPSULES (40 MG TOTAL) BY MOUTH DAILY. 180 capsule 1  . fluticasone (FLONASE) 50 MCG/ACT nasal spray Place 2 sprays into both nostrils daily. 16 g 6  . meclizine (ANTIVERT) 25 MG tablet Take 1 tablet (25 mg total) by mouth 3 (three) times daily as needed for dizziness. 30 tablet 0  . Multiple Vitamins-Minerals (HAIR/SKIN/NAILS PO) Take 3 tablets by mouth daily.    Marland Kitchen omeprazole (PRILOSEC) 20 MG capsule TAKE ONE CAPSULE BY MOUTH DAILY 90 capsule 1  . ranitidine (ZANTAC) 150 MG  tablet Take 150 mg by mouth at bedtime.    . topiramate (TOPAMAX) 25 MG tablet TAKE 1 TABLET (25 MG TOTAL) BY MOUTH DAILY. 90 tablet 1  . traZODone (DESYREL) 50 MG tablet TAKE 1 & 1/2 TABLETS (75 MG TOTAL) BY MOUTH AT BEDTIME. 135 tablet 1   No facility-administered medications prior to visit.     Allergies  Allergen Reactions  . Codeine Nausea And Vomiting  Review of Systems  Constitutional: Negative for fever.  HENT: Positive for tinnitus. Negative for congestion and ear pain.   Eyes: Negative for blurred vision.  Respiratory: Negative for cough.   Cardiovascular: Negative for chest pain and palpitations.  Gastrointestinal: Negative for vomiting.  Musculoskeletal: Negative for back pain.  Skin: Negative for rash.  Neurological: Positive for dizziness. Negative for loss of consciousness and headaches.       Objective:    Physical Exam  Constitutional: She is oriented to person, place, and time. She appears well-developed and well-nourished. No distress.  HENT:  Head: Normocephalic and atraumatic.  Eyes: Conjunctivae are normal.  Neck: Neck supple. No thyromegaly present.  Cardiovascular: Normal rate, regular rhythm and normal heart sounds.  No murmur heard. Pulmonary/Chest: Effort normal and breath sounds normal. No respiratory distress.  Abdominal: Soft. Bowel sounds are normal. She exhibits no distension and no mass. There is no tenderness.  Musculoskeletal: She exhibits no edema.  Lymphadenopathy:    She has no cervical adenopathy.  Neurological: She is alert and oriented to person, place, and time.  Skin: Skin is warm and dry.  Psychiatric: She has a normal mood and affect. Her behavior is normal.    BP 116/70 (BP Location: Left Arm, Patient Position: Sitting, Cuff Size: Large)   Pulse 70   Temp 98.4 F (36.9 C) (Oral)   Resp 16   Ht 5\' 8"  (1.727 m)   Wt 191 lb 3.2 oz (86.7 kg)   SpO2 99%   BMI 29.07 kg/m  Wt Readings from Last 3 Encounters:  01/17/18  191 lb 3.2 oz (86.7 kg)  09/03/17 183 lb 3.2 oz (83.1 kg)  03/19/17 185 lb (83.9 kg)   BP Readings from Last 3 Encounters:  01/17/18 116/70  09/03/17 122/66  03/19/17 108/68     Immunization History  Administered Date(s) Administered  . Influenza Split 09/02/2012  . Influenza,inj,Quad PF,6+ Mos 08/17/2013, 08/31/2014, 07/31/2016, 09/03/2017  . Influenza-Unspecified 09/01/2015  . Pneumococcal Conjugate-13 12/03/1993, 12/04/2003, 10/19/2013  . Tdap 04/15/2013    Health Maintenance  Topic Date Due  . PNEUMOCOCCAL POLYSACCHARIDE VACCINE (1) 12/24/1958  . FOOT EXAM  12/24/1966  . OPHTHALMOLOGY EXAM  12/24/1966  . URINE MICROALBUMIN  12/24/1966  . PAP SMEAR  12/03/1996  . HEMOGLOBIN A1C  12/20/2016  . MAMMOGRAM  12/12/2017  . TETANUS/TDAP  04/16/2023  . COLONOSCOPY  02/16/2026  . INFLUENZA VACCINE  Completed  . Hepatitis C Screening  Completed  . HIV Screening  Completed    Lab Results  Component Value Date   WBC 6.6 11/05/2017   HGB 13.2 11/05/2017   HCT 39.9 11/05/2017   PLT 73.0 (L) 11/05/2017   GLUCOSE 101 (H) 11/05/2017   CHOL 214 (H) 11/05/2017   TRIG 222.0 (H) 11/05/2017   HDL 60.80 11/05/2017   LDLDIRECT 110.0 11/05/2017   LDLCALC 102 (H) 03/12/2017   ALT 14 11/05/2017   AST 22 11/05/2017   NA 136 11/05/2017   K 4.0 11/05/2017   CL 103 11/05/2017   CREATININE 0.83 11/05/2017   BUN 25 (H) 11/05/2017   CO2 27 11/05/2017   TSH 1.84 11/05/2017   INR 1.02 06/19/2016   HGBA1C 4.8 06/19/2016    Lab Results  Component Value Date   TSH 1.84 11/05/2017   Lab Results  Component Value Date   WBC 6.6 11/05/2017   HGB 13.2 11/05/2017   HCT 39.9 11/05/2017   MCV 95.1 11/05/2017   PLT 73.0 (L) 11/05/2017   Lab Results  Component Value Date   NA 136 11/05/2017   K 4.0 11/05/2017   CO2 27 11/05/2017   GLUCOSE 101 (H) 11/05/2017   BUN 25 (H) 11/05/2017   CREATININE 0.83 11/05/2017   BILITOT 0.4 11/05/2017   ALKPHOS 108 11/05/2017   AST 22 11/05/2017    ALT 14 11/05/2017   PROT 8.1 11/05/2017   ALBUMIN 4.3 11/05/2017   CALCIUM 9.6 11/05/2017   ANIONGAP 7 06/19/2016   GFR 74.31 11/05/2017   Lab Results  Component Value Date   CHOL 214 (H) 11/05/2017   Lab Results  Component Value Date   HDL 60.80 11/05/2017   Lab Results  Component Value Date   LDLCALC 102 (H) 03/12/2017   Lab Results  Component Value Date   TRIG 222.0 (H) 11/05/2017   Lab Results  Component Value Date   CHOLHDL 4 11/05/2017   Lab Results  Component Value Date   HGBA1C 4.8 06/19/2016         Assessment & Plan:   Problem List Items Addressed This Visit    Preventative health care    Patient encouraged to maintain heart healthy diet, regular exercise, adequate sleep. Consider daily probiotics. Take medications as prescribed.      Vertigo    Worsening and worsening with movement especially with hyperflexion and hyperextension of neck. Reminded to hydrate well and report worsening symptoms has an appt with neurology good      Hyperlipidemia, mixed    Encouraged heart healthy diet, increase exercise, avoid trans fats, consider a krill oil cap daily       Other Visit Diagnoses    Breast cancer screening    -  Primary   Relevant Orders   MM SCREENING BREAST TOMO BILATERAL   Osteopenia, unspecified location       Relevant Orders   DG Bone Density   Estrogen deficiency       Relevant Orders   DG Bone Density   Vertigo of central origin of both ears       Relevant Orders   MR Angiogram Head Wo Contrast   MR Angiogram Neck W Wo Contrast      I am having Laura Bender start on hydrocortisone. I am also having her maintain her acetaminophen, ranitidine, Multiple Vitamins-Minerals (HAIR/SKIN/NAILS PO), Calcium Carb-Cholecalciferol (CALCIUM 600 + D PO), docusate sodium, Vitamin D, ALPRAZolam, meclizine, fluticasone, omeprazole, traZODone, amoxicillin-clavulanate, benzonatate, famciclovir, clorazepate, topiramate, and FLUoxetine.  Meds  ordered this encounter  Medications  . hydrocortisone (ANUSOL-HC) 25 MG suppository    Sig: Place 1 suppository (25 mg total) rectally 2 (two) times daily.    Dispense:  12 suppository    Refill:  2    CMA served as Education administrator during this visit. History, Physical and Plan performed by medical provider. Documentation and orders reviewed and attested to.  Penni Homans, MD

## 2018-01-17 NOTE — Assessment & Plan Note (Signed)
Encouraged heart healthy diet, increase exercise, avoid trans fats, consider a krill oil cap daily 

## 2018-01-20 ENCOUNTER — Telehealth: Payer: Self-pay | Admitting: Family Medicine

## 2018-01-20 ENCOUNTER — Other Ambulatory Visit (INDEPENDENT_AMBULATORY_CARE_PROVIDER_SITE_OTHER): Payer: BLUE CROSS/BLUE SHIELD

## 2018-01-20 DIAGNOSIS — H8143 Vertigo of central origin, bilateral: Secondary | ICD-10-CM | POA: Diagnosis not present

## 2018-01-20 DIAGNOSIS — IMO0001 Reserved for inherently not codable concepts without codable children: Secondary | ICD-10-CM

## 2018-01-20 LAB — COMPREHENSIVE METABOLIC PANEL
ALBUMIN: 4 g/dL (ref 3.5–5.2)
ALK PHOS: 97 U/L (ref 39–117)
ALT: 13 U/L (ref 0–35)
AST: 18 U/L (ref 0–37)
BUN: 22 mg/dL (ref 6–23)
CO2: 27 mEq/L (ref 19–32)
CREATININE: 0.89 mg/dL (ref 0.40–1.20)
Calcium: 9.2 mg/dL (ref 8.4–10.5)
Chloride: 104 mEq/L (ref 96–112)
GFR: 68.52 mL/min (ref >60.00)
GLUCOSE: 97 mg/dL (ref 70–99)
POTASSIUM: 4 meq/L (ref 3.5–5.1)
SODIUM: 138 meq/L (ref 135–145)
TOTAL PROTEIN: 7.6 g/dL (ref 6.0–8.3)
Total Bilirubin: 0.3 mg/dL (ref 0.2–1.2)

## 2018-01-20 NOTE — Addendum Note (Signed)
Addended by: Bartholome Bill on: 01/20/2018 11:22 AM   Modules accepted: Orders

## 2018-01-20 NOTE — Telephone Encounter (Signed)
CRM for notification. See Telephone encounter for:   01/20/18.   Relation to pt: self  Call back Amelia   Reason for call:   Patient was informed by imaging labs are needed prior to MRA scheduled for Saturday, patient unaware of what labs are needed, but stated imaging requested orders from PCP. Patient would like orders placed at [facility] location and would like labs drawn this afternoon, please leave detail message regarding PCP response.

## 2018-01-25 ENCOUNTER — Ambulatory Visit (HOSPITAL_BASED_OUTPATIENT_CLINIC_OR_DEPARTMENT_OTHER)
Admission: RE | Admit: 2018-01-25 | Discharge: 2018-01-25 | Disposition: A | Discharge status: Home / Self Care | Payer: BLUE CROSS/BLUE SHIELD | Source: Ambulatory Visit | Attending: Family Medicine | Admitting: Family Medicine

## 2018-01-25 DIAGNOSIS — IMO0001 Reserved for inherently not codable concepts without codable children: Secondary | ICD-10-CM

## 2018-01-25 DIAGNOSIS — Z1231 Encounter for screening mammogram for malignant neoplasm of breast: Secondary | ICD-10-CM | POA: Insufficient documentation

## 2018-01-25 DIAGNOSIS — R42 Dizziness and giddiness: Secondary | ICD-10-CM | POA: Diagnosis not present

## 2018-01-25 DIAGNOSIS — H8143 Vertigo of central origin, bilateral: Secondary | ICD-10-CM | POA: Insufficient documentation

## 2018-01-25 DIAGNOSIS — Z1239 Encounter for other screening for malignant neoplasm of breast: Secondary | ICD-10-CM

## 2018-01-25 MED ORDER — GADOBENATE DIMEGLUMINE 529 MG/ML IV SOLN
15.0000 mL | Freq: Once | INTRAVENOUS | Status: AC | PRN
  Administered 2018-01-25: 15 mL via INTRAVENOUS

## 2018-01-31 ENCOUNTER — Other Ambulatory Visit: Payer: Self-pay | Admitting: Family Medicine

## 2018-02-04 ENCOUNTER — Telehealth: Payer: Self-pay | Admitting: *Deleted

## 2018-02-04 ENCOUNTER — Other Ambulatory Visit: Payer: Self-pay | Admitting: Family Medicine

## 2018-02-04 ENCOUNTER — Other Ambulatory Visit: Payer: BLUE CROSS/BLUE SHIELD

## 2018-02-04 DIAGNOSIS — Z79899 Other long term (current) drug therapy: Secondary | ICD-10-CM | POA: Diagnosis not present

## 2018-02-04 DIAGNOSIS — M542 Cervicalgia: Secondary | ICD-10-CM

## 2018-02-04 MED FILL — CLORAZEPATE 7.5 MG TABLET: 7.5 | 90 days supply | Qty: 90 | Fill #0

## 2018-02-04 NOTE — Telephone Encounter (Signed)
Placed order for PT for neck pain secondary to normal MRI

## 2018-02-04 NOTE — Telephone Encounter (Signed)
Notified pt and she voices understanding. Rx placed at front desk with Contract and pt is aware that she will need to provide UDS when she picks up Rx.

## 2018-02-04 NOTE — Telephone Encounter (Signed)
Have her get a UDS and contract and then she can have a refill

## 2018-02-04 NOTE — Telephone Encounter (Signed)
Requesting:tranxene Contract:no UDS:no Last OV:01/17/18 Next OV:05/22/18 Last Refill:10/31/17 Database:no concerns    Please advise

## 2018-02-04 NOTE — Telephone Encounter (Signed)
Pt is requesting MRA result and would like to know her next step from here. Was previously discussing with PCP referral to PT pending result.  Please advise?

## 2018-02-07 LAB — PAIN MGMT, PROFILE 8 W/CONF, U
6 ACETYLMORPHINE: NEGATIVE ng/mL (ref <10)
ALPHAHYDROXYALPRAZOLAM: NEGATIVE ng/mL (ref <25)
ALPHAHYDROXYMIDAZOLAM: NEGATIVE ng/mL (ref <50)
ALPHAHYDROXYTRIAZOLAM: NEGATIVE ng/mL (ref <50)
AMINOCLONAZEPAM: NEGATIVE ng/mL (ref <25)
AMPHETAMINES: NEGATIVE ng/mL (ref <500)
Alcohol Metabolites: NEGATIVE ng/mL (ref <500)
BENZODIAZEPINES: POSITIVE ng/mL — AB (ref <100)
BUPRENORPHINE, URINE: NEGATIVE ng/mL (ref <5)
Cocaine Metabolite: NEGATIVE ng/mL (ref <150)
Creatinine: 65.1 mg/dL
HYDROXYETHYLFLURAZEPAM: NEGATIVE ng/mL (ref <50)
Lorazepam: NEGATIVE ng/mL (ref <50)
MARIJUANA METABOLITE: NEGATIVE ng/mL (ref <20)
MDMA: NEGATIVE ng/mL (ref <500)
NORDIAZEPAM: NEGATIVE ng/mL (ref <50)
OPIATES: NEGATIVE ng/mL (ref <100)
OXYCODONE: NEGATIVE ng/mL (ref <100)
Oxidant: NEGATIVE ug/mL (ref <200)
Temazepam: NEGATIVE ng/mL (ref <50)
pH: 6.25 (ref 4.5–9.0)

## 2018-02-18 DIAGNOSIS — H524 Presbyopia: Secondary | ICD-10-CM | POA: Diagnosis not present

## 2018-02-26 ENCOUNTER — Ambulatory Visit: Payer: BLUE CROSS/BLUE SHIELD | Admitting: Physical Therapy

## 2018-02-28 ENCOUNTER — Other Ambulatory Visit: Payer: Self-pay | Admitting: Family Medicine

## 2018-02-28 ENCOUNTER — Encounter: Payer: Self-pay | Admitting: Physical Therapy

## 2018-02-28 ENCOUNTER — Ambulatory Visit: Payer: BLUE CROSS/BLUE SHIELD | Attending: Family Medicine | Admitting: Physical Therapy

## 2018-02-28 ENCOUNTER — Other Ambulatory Visit: Payer: Self-pay

## 2018-02-28 DIAGNOSIS — R42 Dizziness and giddiness: Secondary | ICD-10-CM

## 2018-02-28 DIAGNOSIS — M542 Cervicalgia: Secondary | ICD-10-CM

## 2018-02-28 DIAGNOSIS — H8112 Benign paroxysmal vertigo, left ear: Secondary | ICD-10-CM | POA: Diagnosis not present

## 2018-02-28 NOTE — Therapy (Signed)
[facility] [address] [place], [place], [address] Phone: [phone]   Fax:  [phone]  Physical Therapy Evaluation  Patient Details  Name: Laura Bender MRN: [id number] Date of Birth: 1956-01-23 Referring Provider: [name], MD   Encounter Date: 02/28/2018  PT End of Session - 02/28/18 1629    Visit Number  1    Number of Visits  4    Authorization Type  BCBS    PT Start Time  6283    PT Stop Time  1530    PT Time Calculation (min)  42 min    Activity Tolerance  Patient tolerated treatment well    Behavior During Therapy  [facility] for tasks assessed/performed       Past Medical History:  Diagnosis Date  . Advanced care planning/counseling discussion 09/01/2014  . Allergic state 04/17/2013  . Anxiety   . Clotting disorder ([place])   . Depression 03-04-12   tx. meds  . Depression with anxiety 04/17/2013  . ETOH abuse   . Fibromyalgia 03-04-12   Sporadic pain  . GERD (gastroesophageal reflux disease) 08/17/2013  . History of transfusion of platelets X 1   "related to ITP"  . Hyperlipidemia, mixed 10/16/2015  . ITP (idiopathic thrombocytopenic purpura) 03-04-12   Dx.  '95- blow normal levels, but stable.  . Lipoma 03-04-12   Rt. back about scapular ares  . Migraine    "that's what the dr thinks I had yesterday; still unsure; never had one before" (06/19/2016)  . Overweight 03/06/2015  . PONV (postoperative nausea and vomiting)   . Preventative health care 04/17/2013; 08/17/2013  . Solitary pulmonary nodule 08/17/2013   LLL seen on CT in March 2014    Past Surgical History:  Procedure Laterality Date  . APPENDECTOMY  1995  . BREAST BIOPSY Left 2012   needle biopsy(benign)-Titanium needle marker remains  . CHOLECYSTECTOMY N/A 04/24/2013   Procedure: LAPAROSCOPIC CHOLECYSTECTOMY WITH INTRAOPERATIVE CHOLANGIOGRAM;  Surgeon: Haywood Lasso, MD;  Location: [facility];  Service: General;  Laterality: N/A;  .  COLONOSCOPY  2008   WNL, Dr Cristina Gong  . IRRIGATION AND DEBRIDEMENT ABSCESS  ~ 2005 X 2   buttocks  . LIPOMA EXCISION  ~ 04/2016   "base of my neck"  . MASS EXCISION  03/10/2012   Procedure: EXCISION MASS;  Surgeon: Odis Hollingshead, MD;  Location: [facility];  Service: General;  Laterality: N/A;  Removal of back lipoma  . 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 SPLENECTOMY, TOTAL  1995  . TOTAL ABDOMINAL HYSTERECTOMY  1995   total, endometriosis, fibroid, ovarian atrophy    There were no vitals filed for this visit.   Subjective Assessment - 02/28/18 1453    Subjective  Last summer my vertigo began to get worse. Then it got to the point that any time I looked up or looked down it got really bad. I talked to my pharmacist and began weaning down my clorazepate and that seems to have really helped.     Pertinent History   anxiety, clotting disorder, fibromyalgia, migraine    Patient Stated Goals  to stop having vertigo    Currently in Pain?  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           Vestibular Assessment - 02/28/18 1500      Vestibular Assessment   General Observation  has been getting less severe      Symptom Behavior   Type of Dizziness  Spinning    Frequency of Dizziness  a few times a week     Duration of Dizziness  seconds    Aggravating Factors  Lying supine;Turning head quickly;Rolling to right;Rolling to left    Relieving Factors  Head stationary      Occulomotor Exam   Occulomotor Alignment  Normal    Spontaneous  Absent       Positional Testing   Dix-Hallpike  Dix-Hallpike Left    Horizontal Canal Testing  Horizontal Canal Right;Horizontal Canal Left      Dix-Hallpike Left   Dix-Hallpike Left Duration  5 seconds    Dix-Hallpike Left Symptoms  Upbeat, left rotatory nystagmus      Horizontal Canal Right   Horizontal Canal Right Duration  0    Horizontal Canal Right Symptoms  Normal      Horizontal Canal Left   Horizontal Canal Left Duration  0    Horizontal Canal Left Symptoms  Normal        No data recorded  Objective measurements completed on examination: See above findings.       Vestibular Treatment/Exercise - 02/28/18 0001      Vestibular Treatment/Exercise   Vestibular Treatment Provided  Canalith Repositioning    Canalith Repositioning  Epley Manuever Left       EPLEY MANUEVER LEFT   Number of Reps   2    Overall Response   Symptoms Resolved     RESPONSE DETAILS LEFT  after second Epley, checked lt Dix-Hallpike and negative            PT Education - 02/28/18 1628    Education provided  Yes    Education Details  what BPPV is; process for getting treatment (MD referral to PT; ?some Urgent care centers--call first)    Person(s) Educated  Patient    Methods  Explanation    Comprehension  Verbalized understanding          PT Long Term Goals - 02/28/18 1640      PT LONG TERM GOAL #1   Title  Patient will have negative positional testing (Target for STGs 03/30/18)    Time  4    Period  Weeks    Status  New    Target Date  03/30/18      PT LONG TERM GOAL #2   Title  Patient will be able to verbalize process for obtaining treatment for BPPV.    Time  4    Period  Weeks    Status  New             Plan - 02/28/18 1630    Clinical Impression Statement  Patient referred for PT evaluation for vertigo and neck pain. Patient reports her neck is not painful and is only seeking treatment for vertigo. Pt with positive Lt Dix-Hallpike and  treated with Lt Epley x 2 with  symptoms resolved. Patient reported she has had canalith repositioning by a PA in the past and "it didn't last long. It came back the next day." Follow-up appointment scheduled in case pt is not clear of symptoms after today's session.     History and Personal Factors relevant to plan of care:  PMH- anxiety, clotting disorder, fibromyalgia, migraine    Clinical Presentation  Stable    Clinical Decision Making  Low    Rehab Potential  Good    Clinical Impairments Affecting Rehab Potential  repeated BPPV per pt    PT Frequency  1x / week    PT Duration  4 weeks    PT Treatment/Interventions  Canalith Repostioning;ADLs/Self Care Home Management;Patient/family education;Vestibular    PT Next Visit Plan  reassess for left pBPPV    Consulted and Agree with Plan of Care  Patient       Patient will benefit from skilled therapeutic intervention in order to improve the following deficits and impairments:  Decreased activity tolerance, Decreased mobility, Dizziness  Visit Diagnosis: BPPV (benign paroxysmal positional vertigo), left - Plan: PT plan of care cert/re-cert     Problem List Patient Active Problem List   Diagnosis Date Noted  . Eustachian tube dysfunction 07/03/2016  . Parotid nodule 07/03/2016  . Chronic migraine 07/03/2016  . TIA (transient ischemic attack) 06/19/2016  . Stroke-like symptoms 06/19/2016  . ETOH abuse   . Skin nodule 02/19/2016  . Hyperlipidemia, mixed 10/16/2015  . Overweight 03/06/2015  . Advanced care planning/counseling discussion 09/01/2014  . Vertigo 02/11/2014  . GERD (gastroesophageal reflux disease) 08/17/2013  . Solitary pulmonary nodule 08/17/2013  . Preventative health care 08/17/2013  . Allergic state 04/17/2013  . Fibromyalgia 04/17/2013  . Depression with anxiety 04/17/2013  . Lipoma of back 02/07/2012  . Idiopathic thrombocytopenic purpura ([place]) 11/23/2011    Rexanne Mano , PT 02/28/2018, 4:43 PM  [facility] [address] [place], [place], [address] Phone: [phone]   Fax:  [phone]  Name: Laura Bender MRN: [id number] Date of Birth: May 09, 1956

## 2018-03-03 ENCOUNTER — Telehealth: Payer: Self-pay

## 2018-03-03 NOTE — Telephone Encounter (Signed)
Copied from [place] [id number]. Topic: Referral - Question >> Feb 28, 2018  9:11 AM Bea Graff, NT wrote: Reason for CRM: Pt calling and PT states they need the referral changed to vertigo instead of Neck pain. Patient has an appt with them today.

## 2018-03-03 NOTE — Telephone Encounter (Signed)
Please advise 

## 2018-03-10 ENCOUNTER — Other Ambulatory Visit: Payer: Self-pay | Admitting: Family Medicine

## 2018-03-10 MED FILL — TOPIRAMATE 25 MG TAB: 25 | 90 days supply | Qty: 90 | Fill #1

## 2018-03-10 MED FILL — traZODone HCL 50 MG TABS: 50 | 90 days supply | Qty: 135 | Fill #0

## 2018-03-11 ENCOUNTER — Ambulatory Visit: Payer: BLUE CROSS/BLUE SHIELD | Attending: Family Medicine | Admitting: Physical Therapy

## 2018-03-11 ENCOUNTER — Encounter: Payer: Self-pay | Admitting: Physical Therapy

## 2018-03-11 DIAGNOSIS — H8112 Benign paroxysmal vertigo, left ear: Secondary | ICD-10-CM | POA: Insufficient documentation

## 2018-03-12 DIAGNOSIS — H524 Presbyopia: Secondary | ICD-10-CM | POA: Diagnosis not present

## 2018-03-12 DIAGNOSIS — H52223 Regular astigmatism, bilateral: Secondary | ICD-10-CM | POA: Diagnosis not present

## 2018-03-12 DIAGNOSIS — H5213 Myopia, bilateral: Secondary | ICD-10-CM | POA: Diagnosis not present

## 2018-03-12 MED FILL — OMEPRAZOLE 20 MG CAP: 20 | 90 days supply | Qty: 90 | Fill #0

## 2018-03-12 NOTE — Therapy (Addendum)
[facility] [address] [place], [place], [address] Phone: [phone]   Fax:  [phone]  Physical Therapy Treatment and Discharge Summary  Patient Details  Name: Laura Bender MRN: [id number] Date of Birth: [birth year] Referring Provider: [name], MD   Encounter Date: 03/11/2018  PT End of Session - 03/12/18 1844    Visit Number  2    Number of Visits  4    Authorization Type  BCBS    PT Start Time  4235    PT Stop Time  1504    PT Time Calculation (min)  17 min    Activity Tolerance  Patient tolerated treatment well    Behavior During Therapy  [facility] for tasks assessed/performed       Past Medical History:  Diagnosis Date  . Advanced care planning/counseling discussion 09/01/2014  . Allergic state 04/17/2013  . Anxiety   . Clotting disorder ([place])   . Depression 03-04-12   tx. meds  . Depression with anxiety 04/17/2013  . ETOH abuse   . Fibromyalgia 03-04-12   Sporadic pain  . GERD (gastroesophageal reflux disease) 08/17/2013  . History of transfusion of platelets X 1   "related to ITP"  . Hyperlipidemia, mixed 10/16/2015  . ITP (idiopathic thrombocytopenic purpura) 03-04-12   Dx.  '95- blow normal levels, but stable.  . Lipoma 03-04-12   Rt. back about scapular ares  . Migraine    "that's what the dr thinks I had yesterday; still unsure; never had one before" (06/19/2016)  . Overweight 03/06/2015  . PONV (postoperative nausea and vomiting)   . Preventative health care 04/17/2013; 08/17/2013  . Solitary pulmonary nodule 08/17/2013   LLL seen on CT in March 2014    Past Surgical History:  Procedure Laterality Date  . APPENDECTOMY  1995  . BREAST BIOPSY Left 2012   needle biopsy(benign)-Titanium needle marker remains  . CHOLECYSTECTOMY N/A 04/24/2013   Procedure: LAPAROSCOPIC CHOLECYSTECTOMY WITH INTRAOPERATIVE CHOLANGIOGRAM;  Surgeon: Haywood Lasso, MD;  Location: [facility];  Service: General;   Laterality: N/A;  . COLONOSCOPY  2008   WNL, Dr Cristina Gong  . IRRIGATION AND DEBRIDEMENT ABSCESS  ~ 2005 X 2   buttocks  . LIPOMA EXCISION  ~ 04/2016   "base of my neck"  . MASS EXCISION  03/10/2012   Procedure: EXCISION MASS;  Surgeon: Odis Hollingshead, MD;  Location: [facility];  Service: General;  Laterality: N/A;  Removal of back lipoma  . 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 SPLENECTOMY, TOTAL  1995  . TOTAL ABDOMINAL HYSTERECTOMY  1995   total, endometriosis, fibroid, ovarian atrophy    There were no vitals filed for this visit.  Subjective Assessment - 03/11/18 1700    Subjective  Reports had no symptoms for 4 or 5 days and then when lying down or sitting up from supine has been having very slight vertigo.     Pertinent History   anxiety, clotting disorder, fibromyalgia, migraine    Patient Stated Goals  to stop having vertigo    Currently in Pain?  No/denies             Vestibular Assessment - 03/11/18 1453      Symptom Behavior   Type of Dizziness  Spinning    Frequency of Dizziness  daily for last couple of days    Duration of Dizziness  seconds    Aggravating Factors  Lying supine;Supine to sit  Relieving Factors  Head stationary      Positional Testing   Dix-Hallpike  Dix-Hallpike Left      Dix-Hallpike Left   Dix-Hallpike Left Duration  10    Dix-Hallpike Left Symptoms  Upbeat, left rotatory nystagmus                Vestibular Treatment/Exercise - 03/11/18 1700      Vestibular Treatment/Exercise   Vestibular Treatment Provided  Canalith Repositioning    Canalith Repositioning  Epley Manuever Left       EPLEY MANUEVER LEFT   Number of Reps   4    Overall Response   Symptoms Resolved     RESPONSE DETAILS LEFT  symptoms diminished with each Epley and on 4th with no symptoms or nystagmus            PT Education - 03/12/18 1842    Education Details  how to do Epley--that it may not always be her left posterior canal, but  will not do any harm to try; episode of care can remain open until 03/30/18 (MD POC covered until then) and she can call for appt if needed before then; after 4/28 would have to get new referral for PT evaluation from MD    Person(s) Educated  Patient    Methods  Explanation;Demonstration;Handout    Comprehension  Verbalized understanding;Returned demonstration          PT Long Term Goals - 02/28/18 1640      PT LONG TERM GOAL #1   Title  Patient will have negative positional testing ([facility] for STGs 03/30/18)    Time  4    Period  Weeks    Status  New    Target Date  03/30/18      PT LONG TERM GOAL #2   Title  Patient will be able to verbalize process for obtaining treatment for BPPV.    Time  4    Period  Weeks    Status  New            Plan - 03/12/18 1921    Clinical Impression Statement  Patient presented with left posterior canalithiasis and responded to repeat left Epley maneuvers. Patient reports she has had recurrent BPPV in the past and provided her with education on how to do Epley maneuver at home. Recommended she have someone assist her to maintain proper head position throughout. Patient currently with no further appointments scheduled, however was told to call if the need arises.     Rehab Potential  Good    Clinical Impairments Affecting Rehab Potential  repeated BPPV per pt    PT Frequency  1x / week    PT Duration  4 weeks    PT Treatment/Interventions  Canalith Repostioning;ADLs/Self Care Home Management;Patient/family education;Vestibular    PT Next Visit Plan  reassess for left pBPPV    Consulted and Agree with Plan of Care  Patient       Patient will benefit from skilled therapeutic intervention in order to improve the following deficits and impairments:  Decreased activity tolerance, Decreased mobility, Dizziness  Visit Diagnosis: BPPV (benign paroxysmal positional vertigo), left     Problem List Patient Active Problem List   Diagnosis Date  Noted  . Eustachian tube dysfunction 07/03/2016  . Parotid nodule 07/03/2016  . Chronic migraine 07/03/2016  . TIA (transient ischemic attack) 06/19/2016  . Stroke-like symptoms 06/19/2016  . ETOH abuse   . Skin nodule 02/19/2016  . Hyperlipidemia, mixed  10/16/2015  . Overweight 03/06/2015  . Advanced care planning/counseling discussion 09/01/2014  . Vertigo 02/11/2014  . GERD (gastroesophageal reflux disease) 08/17/2013  . Solitary pulmonary nodule 08/17/2013  . Preventative health care 08/17/2013  . Allergic state 04/17/2013  . Fibromyalgia 04/17/2013  . Depression with anxiety 04/17/2013  . Lipoma of back 02/07/2012  . Idiopathic thrombocytopenic purpura ([place]) 11/23/2011    Jeanie Cooks Larrie Fraizer, PT 03/12/2018, 7:30 PM  07/04/18 Addendum  PHYSICAL THERAPY DISCHARGE SUMMARY  Visits from Start of Care: 2  Current functional level related to goals / functional outcomes: Patient has not returned to PT after 03/11/18   Remaining deficits: Patient has not returned to PT after 03/11/18   Education / Equipment: Patient has not returned to PT after 03/11/18  Plan: Patient agrees to discharge.  Patient goals were not met. Patient is being discharged due to not returning since the last visit.  ?????      Barry Brunner, PT [facility] [address], [place], [place] [address] [phone]     [facility] [address] [place], [place], [address] Phone: [phone]   Fax:  [phone]  Name: Laura Bender MRN: [id number] Date of Birth: 1955/12/07

## 2018-03-12 NOTE — Patient Instructions (Signed)
Provided handout with steps for Lt Epley maneuver.

## 2018-04-15 ENCOUNTER — Ambulatory Visit (INDEPENDENT_AMBULATORY_CARE_PROVIDER_SITE_OTHER): Payer: BLUE CROSS/BLUE SHIELD | Admitting: Family Medicine

## 2018-04-15 ENCOUNTER — Encounter: Payer: Self-pay | Admitting: Family Medicine

## 2018-04-15 VITALS — BP 124/68 | HR 62 | Temp 98.2°F | Resp 16 | Ht 68.0 in | Wt 194.8 lb

## 2018-04-15 DIAGNOSIS — H60502 Unspecified acute noninfective otitis externa, left ear: Secondary | ICD-10-CM | POA: Diagnosis not present

## 2018-04-15 DIAGNOSIS — R221 Localized swelling, mass and lump, neck: Secondary | ICD-10-CM | POA: Diagnosis not present

## 2018-04-15 DIAGNOSIS — Z01812 Encounter for preprocedural laboratory examination: Secondary | ICD-10-CM | POA: Diagnosis not present

## 2018-04-15 LAB — COMPREHENSIVE METABOLIC PANEL
ALBUMIN: 4.1 g/dL (ref 3.5–5.2)
ALT: 15 U/L (ref 0–35)
AST: 19 U/L (ref 0–37)
Alkaline Phosphatase: 114 U/L (ref 39–117)
BILIRUBIN TOTAL: 0.2 mg/dL (ref 0.2–1.2)
BUN: 22 mg/dL (ref 6–23)
CALCIUM: 9.4 mg/dL (ref 8.4–10.5)
CHLORIDE: 108 meq/L (ref 96–112)
CO2: 28 meq/L (ref 19–32)
Creatinine, Ser: 0.91 mg/dL (ref 0.40–1.20)
GFR: 66.73 mL/min (ref >60.00)
Glucose, Bld: 97 mg/dL (ref 70–99)
Potassium: 5.2 mEq/L — ABNORMAL HIGH (ref 3.5–5.1)
Sodium: 144 mEq/L (ref 135–145)
Total Protein: 7.6 g/dL (ref 6.0–8.3)

## 2018-04-15 MED ORDER — LORATADINE 10 MG PO TABS: 10.0000 mg | ORAL_TABLET | Freq: Every day | ORAL | 11 refills | Status: DC

## 2018-04-15 MED ORDER — OFLOXACIN 0.3 % OT SOLN: 10.0000 [drp] | Freq: Every day | OTIC | 0 refills | Status: DC

## 2018-04-15 MED ORDER — FLUTICASONE PROPIONATE 50 MCG/ACT NA SUSP: 2.0000 | Freq: Every day | NASAL | 6 refills | Status: DC

## 2018-04-15 MED FILL — FLUTICASONE PROP 50 MCG SPR: 50 | 30 days supply | Qty: 16 | Fill #0

## 2018-04-15 MED FILL — OFLOXACIN 0.3% EAR DROPS: 0.3 | 10 days supply | Qty: 5 | Fill #0

## 2018-04-15 MED FILL — LORATADINE 10 MG TABLET: 10 | 100 days supply | Qty: 100 | Fill #0

## 2018-04-15 NOTE — Progress Notes (Signed)
[name], female   DOB: 1956/11/15, 62 y.o.   MRN: [id number]     Subjective:  I acted as a Education administrator for Dr. Carollee Herter.  Guerry Bruin, [place]   Patient ID: CHAQUANA NICHOLS, female    DOB: September 19, 1956, 62 y.o.   MRN: [id number]  Chief Complaint  Patient presents with  . Otalgia    left    Otalgia   There is pain in the left ear. This is a new problem. Episode onset: several weeks. The problem has been rapidly worsening. Associated symptoms include coughing. Pertinent negatives include no ear discharge, headaches, rash, rhinorrhea, sore throat or vomiting. She has tried acetaminophen for the symptoms.    Patient is in today for left ear pain.  Patient Care Team: [name], MD as PCP - General (Family Medicine) Ronald Lobo, MD as Consulting Physician (Gastroenterology) Clent Jacks, MD as Consulting Physician (Ophthalmology)   Past Medical History:  Diagnosis Date  . Advanced care planning/counseling discussion 09/01/2014  . Allergic state 04/17/2013  . Anxiety   . Clotting disorder ([place])   . Depression 03-04-12   tx. meds  . Depression with anxiety 04/17/2013  . ETOH abuse   . Fibromyalgia 03-04-12   Sporadic pain  . GERD (gastroesophageal reflux disease) 08/17/2013  . History of transfusion of platelets X 1   "related to ITP"  . Hyperlipidemia, mixed 10/16/2015  . ITP (idiopathic thrombocytopenic purpura) 03-04-12   Dx.  '95- blow normal levels, but stable.  . Lipoma 03-04-12   Rt. back about scapular ares  . Migraine    "that's what the dr thinks I had yesterday; still unsure; never had one before" (06/19/2016)  . Overweight 03/06/2015  . PONV (postoperative nausea and vomiting)   . Preventative health care 04/17/2013; 08/17/2013  . Solitary pulmonary nodule 08/17/2013   LLL seen on CT in March 2014    Past Surgical History:  Procedure Laterality Date  . APPENDECTOMY  1995  . BREAST BIOPSY Left 2012   needle biopsy(benign)-Titanium needle marker remains    . CHOLECYSTECTOMY N/A 04/24/2013   Procedure: LAPAROSCOPIC CHOLECYSTECTOMY WITH INTRAOPERATIVE CHOLANGIOGRAM;  Surgeon: Haywood Lasso, MD;  Location: [facility];  Service: General;  Laterality: N/A;  . COLONOSCOPY  2008   WNL, Dr Cristina Gong  . IRRIGATION AND DEBRIDEMENT ABSCESS  ~ 2005 X 2   buttocks  . LIPOMA EXCISION  ~ 04/2016   "base of my neck"  . MASS EXCISION  03/10/2012   Procedure: EXCISION MASS;  Surgeon: Odis Hollingshead, MD;  Location: [facility];  Service: General;  Laterality: N/A;  Removal of back lipoma  . 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 SPLENECTOMY, TOTAL  1995  . TOTAL ABDOMINAL HYSTERECTOMY  1995   total, endometriosis, fibroid, ovarian atrophy    Family History  Problem Relation Age of Onset  . Heart disease Maternal Grandmother   . Heart disease Maternal Grandfather   . Heart attack Maternal Grandfather 62  . Heart disease Paternal Grandmother   . Heart attack Paternal Grandmother 86  . Heart disease Paternal Grandfather   . Cancer Paternal Grandfather        chest- smoker  . Diabetes Mother        type 2- controlled by diet  . Atrial fibrillation Father   . Hyperlipidemia Sister   . Migraines Sister   . Cancer Sister        pancreatic  . Depression Sister   .  COPD Sister     Social History   Socioeconomic History  . Marital status: Single    Spouse name: Not on file  . Number of children: 0  . Years of education: 75  . Highest education level: Not on file  Occupational History  . Not on file  Social Needs  . Financial resource strain: Not on file  . Food insecurity:    Worry: Not on file    Inability: Not on file  . Transportation needs:    Medical: Not on file    Non-medical: Not on file  Tobacco Use  . Smoking status: Former Smoker    Packs/day: 0.75    Years: 5.00    Pack years: 3.75    Types: Cigarettes    Last attempt to quit: 02/21/1983    Years since quitting: 35.1  . Smokeless tobacco: Never Used  Substance  and Sexual Activity  . Alcohol use: Yes    Alcohol/week: 7.2 oz    Types: 12 Cans of beer per week  . Drug use: No  . 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 Physical activity:    Days per week: Not on file    Minutes per session: Not on file  . Stress: Not on file  Relationships  . Social connections:    Talks on phone: Not on file    Gets together: Not on file    Attends religious service: Not on file    Active member of club or organization: Not on file    Attends meetings of clubs or organizations: Not on file    Relationship status: Not on file  . Intimate partner violence:    Fear of current or ex partner: Not on file    Emotionally abused: Not on file    Physically abused: Not on file    Forced sexual activity: Not on file  Other Topics Concern  . Not on file  Social History Narrative   Lives at home w/ her roommate   Right-handed   Caffeine: has cut down to 1-2 cups 1/2 caf coffee in the a.m.    Outpatient Medications Prior to Visit  Medication Sig Dispense Refill  . acetaminophen (TYLENOL) 500 MG tablet Take 1,000 mg by mouth every 6 (six) hours as needed for pain.    Marland Kitchen ALPRAZolam (XANAX) 0.25 MG tablet TAKE 1 TABLET BY MOUTH TWICE DAILY AS NEEDED FOR SLEEP OR ANXIETY 20 tablet 2  . Calcium Carb-Cholecalciferol (CALCIUM 600 + D PO) Take 1 tablet by mouth daily.    . Cholecalciferol (VITAMIN D) 2000 units CAPS Take 2,000 Units by mouth daily.    . clorazepate (TRANXENE) 7.5 MG tablet Take 1 tablet (7.5 mg total) by mouth at bedtime. 90 tablet 0  . clorazepate (TRANXENE) 7.5 MG tablet TAKE 1 TABLET BY MOUTH AT BEDTIME 90 tablet 0  . docusate sodium (COLACE) 100 MG capsule Take 100 mg by mouth daily.    . famciclovir (FAMVIR) 500 MG tablet Take 1 tablet (500 mg total) by mouth 3 (three) times daily. 21 tablet 0  . FLUoxetine (PROZAC) 20 MG capsule TAKE 2 CAPSULES  (40 MG TOTAL) BY MOUTH DAILY. 180 capsule 1  . fluticasone (FLONASE) 50 MCG/ACT nasal spray Place 2 sprays into both nostrils daily. 16 g 6  . hydrocortisone (ANUSOL-HC) 25 MG suppository Place 1 suppository (25 mg total) rectally  2 (two) times daily. 12 suppository 2  . meclizine (ANTIVERT) 25 MG tablet Take 1 tablet (25 mg total) by mouth 3 (three) times daily as needed for dizziness. 30 tablet 0  . Multiple Vitamins-Minerals (HAIR/SKIN/NAILS PO) Take 3 tablets by mouth daily.    Marland Kitchen omeprazole (PRILOSEC) 20 MG capsule TAKE ONE CAPSULE BY MOUTH DAILY 90 capsule 1  . ranitidine (ZANTAC) 150 MG tablet Take 150 mg by mouth at bedtime.    . topiramate (TOPAMAX) 25 MG tablet TAKE 1 TABLET (25 MG TOTAL) BY MOUTH DAILY. 90 tablet 1  . traZODone (DESYREL) 50 MG tablet TAKE 1 & 1/2 TABLETS (75 MG TOTAL) BY MOUTH AT BEDTIME. 135 tablet 1  . amoxicillin-clavulanate (AUGMENTIN) 875-125 MG tablet Take 1 tablet by mouth 2 (two) times daily. (Patient not taking: Reported on 02/28/2018) 20 tablet 0  . benzonatate (TESSALON) 100 MG capsule Take 1 capsule (100 mg total) by mouth 3 (three) times daily as needed for cough. (Patient not taking: Reported on 02/28/2018) 21 capsule 0   No facility-administered medications prior to visit.     Allergies  Allergen Reactions  . Codeine Nausea And Vomiting    Review of Systems  Constitutional: Negative for fever and malaise/fatigue.  HENT: Positive for ear pain. Negative for congestion, ear discharge, rhinorrhea and sore throat.   Eyes: Negative for blurred vision.  Respiratory: Positive for cough. Negative for shortness of breath.   Cardiovascular: Negative for chest pain, palpitations and leg swelling.  Gastrointestinal: Negative for vomiting.  Musculoskeletal: Negative for back pain.  Skin: Negative for rash.  Neurological: Negative for loss of consciousness and headaches.       Objective:    Physical Exam  Constitutional: She is oriented to person, place,  and time. She appears well-developed and well-nourished.  HENT:  Head: Normocephalic and atraumatic.  Left Ear: Tympanic membrane is retracted. Tympanic membrane is not injected. Tympanic membrane mobility is abnormal. A middle ear effusion is present.  Ears:  Eyes: Conjunctivae and EOM are normal.  Neck: Normal range of motion. Neck supple. No JVD present. Carotid bruit is not present. No thyromegaly present.  Cardiovascular: Normal rate, regular rhythm and normal heart sounds.  No murmur heard. Pulmonary/Chest: Effort normal and breath sounds normal. No respiratory distress. She has no wheezes. She has no rales. She exhibits no tenderness.  Musculoskeletal: She exhibits no edema.  Neurological: She is alert and oriented to person, place, and time.  Psychiatric: She has a normal mood and affect.  Nursing note and vitals reviewed.   BP 124/68 (BP Location: Left Arm, Cuff Size: Normal)   Pulse 62   Temp 98.2 F (36.8 C) (Oral)   Resp 16   Ht 5\' 8"  (1.727 m)   Wt 194 lb 12.8 oz (88.4 kg)   SpO2 94%   BMI 29.62 kg/m  Wt Readings from Last 3 Encounters:  04/15/18 194 lb 12.8 oz (88.4 kg)  01/17/18 191 lb 3.2 oz (86.7 kg)  09/03/17 183 lb 3.2 oz (83.1 kg)   BP Readings from Last 3 Encounters:  04/15/18 124/68  01/17/18 116/70  09/03/17 122/66     Immunization History  Administered Date(s) Administered  . Influenza Split 09/02/2012  . Influenza,inj,Quad PF,6+ Mos 08/17/2013, 08/31/2014, 07/31/2016, 09/03/2017  . Influenza-Unspecified 09/01/2015  . Pneumococcal Conjugate-13 12/03/1993, 12/04/2003, 10/19/2013  . Tdap 04/15/2013    Health Maintenance  Topic Date Due  . PNEUMOCOCCAL POLYSACCHARIDE VACCINE (1) 12/24/1958  . FOOT EXAM  12/24/1966  . OPHTHALMOLOGY EXAM  12/24/1966  . URINE MICROALBUMIN  12/24/1966  . PAP SMEAR  12/03/1996  . HEMOGLOBIN A1C  12/20/2016  . INFLUENZA VACCINE  07/03/2018  . MAMMOGRAM  01/26/2020  . TETANUS/TDAP  04/16/2023  . COLONOSCOPY   02/16/2026  . Hepatitis C Screening  Completed  . HIV Screening  Completed    Lab Results  Component Value Date   WBC 6.6 11/05/2017   HGB 13.2 11/05/2017   HCT 39.9 11/05/2017   PLT 73.0 (L) 11/05/2017   GLUCOSE 97 04/15/2018   CHOL 214 (H) 11/05/2017   TRIG 222.0 (H) 11/05/2017   HDL 60.80 11/05/2017   LDLDIRECT 110.0 11/05/2017   LDLCALC 102 (H) 03/12/2017   ALT 15 04/15/2018   AST 19 04/15/2018   NA 144 04/15/2018   K 5.2 (H) 04/15/2018   CL 108 04/15/2018   CREATININE 0.91 04/15/2018   BUN 22 04/15/2018   CO2 28 04/15/2018   TSH 1.84 11/05/2017   INR 1.02 06/19/2016   HGBA1C 4.8 06/19/2016    Lab Results  Component Value Date   TSH 1.84 11/05/2017   Lab Results  Component Value Date   WBC 6.6 11/05/2017   HGB 13.2 11/05/2017   HCT 39.9 11/05/2017   MCV 95.1 11/05/2017   PLT 73.0 (L) 11/05/2017   Lab Results  Component Value Date   NA 144 04/15/2018   K 5.2 (H) 04/15/2018   CO2 28 04/15/2018   GLUCOSE 97 04/15/2018   BUN 22 04/15/2018   CREATININE 0.91 04/15/2018   BILITOT 0.2 04/15/2018   ALKPHOS 114 04/15/2018   AST 19 04/15/2018   ALT 15 04/15/2018   PROT 7.6 04/15/2018   ALBUMIN 4.1 04/15/2018   CALCIUM 9.4 04/15/2018   ANIONGAP 7 06/19/2016   GFR 66.73 04/15/2018   Lab Results  Component Value Date   CHOL 214 (H) 11/05/2017   Lab Results  Component Value Date   HDL 60.80 11/05/2017   Lab Results  Component Value Date   LDLCALC 102 (H) 03/12/2017   Lab Results  Component Value Date   TRIG 222.0 (H) 11/05/2017   Lab Results  Component Value Date   CHOLHDL 4 11/05/2017   Lab Results  Component Value Date   HGBA1C 4.8 06/19/2016         Assessment & Plan:   Problem List Items Addressed This Visit    None    Visit Diagnoses    Acute otitis externa of left ear, unspecified type    -  Primary   Relevant Medications   ofloxacin (FLOXIN OTIC) 0.3 % OTIC solution   fluticasone (FLONASE) 50 MCG/ACT nasal spray   loratadine  (CLARITIN) 10 MG tablet   Localized swelling, mass or lump of neck       Relevant Orders   CT Soft Tissue Neck W Contrast   Pre-procedural laboratory examinations       Relevant Orders   Comprehensive metabolic panel (Completed)      I have discontinued Nobie Putnam. Wellbrock's amoxicillin-clavulanate and benzonatate. I am also having her start on ofloxacin, fluticasone, and loratadine. Additionally, I am having her maintain her acetaminophen, ranitidine, Multiple Vitamins-Minerals (HAIR/SKIN/NAILS PO), Calcium Carb-Cholecalciferol (CALCIUM 600 + D PO), docusate sodium, Vitamin D, ALPRAZolam, meclizine, fluticasone, traZODone, famciclovir, clorazepate, topiramate, FLUoxetine, hydrocortisone, clorazepate, and omeprazole.  Meds ordered this encounter  Medications  . ofloxacin (FLOXIN OTIC) 0.3 % OTIC solution    Sig: Place 10 drops into the left ear daily.    Dispense:  5 mL  Refill:  0  . fluticasone (FLONASE) 50 MCG/ACT nasal spray    Sig: Place 2 sprays into both nostrils daily.    Dispense:  16 g    Refill:  6  . loratadine (CLARITIN) 10 MG tablet    Sig: Take 1 tablet (10 mg total) by mouth daily.    Dispense:  30 tablet    Refill:  11    CMA served as scribe during this visit. History, Physical and Plan performed by medical provider. Documentation and orders reviewed and attested to.  Ann Held, DO

## 2018-04-15 NOTE — Patient Instructions (Signed)

## 2018-04-17 ENCOUNTER — Ambulatory Visit (HOSPITAL_BASED_OUTPATIENT_CLINIC_OR_DEPARTMENT_OTHER)
Admission: RE | Admit: 2018-04-17 | Discharge: 2018-04-17 | Disposition: A | Discharge status: Home / Self Care | Payer: BLUE CROSS/BLUE SHIELD | Source: Ambulatory Visit | Attending: Family Medicine | Admitting: Family Medicine

## 2018-04-17 ENCOUNTER — Encounter (HOSPITAL_BASED_OUTPATIENT_CLINIC_OR_DEPARTMENT_OTHER): Payer: Self-pay

## 2018-04-17 DIAGNOSIS — R221 Localized swelling, mass and lump, neck: Secondary | ICD-10-CM | POA: Diagnosis not present

## 2018-04-17 DIAGNOSIS — L905 Scar conditions and fibrosis of skin: Secondary | ICD-10-CM | POA: Diagnosis not present

## 2018-04-17 DIAGNOSIS — D170 Benign lipomatous neoplasm of skin and subcutaneous tissue of head, face and neck: Secondary | ICD-10-CM | POA: Diagnosis not present

## 2018-04-17 MED ORDER — IOPAMIDOL (ISOVUE-300) INJECTION 61%
100.0000 mL | Freq: Once | INTRAVENOUS | Status: AC | PRN
  Administered 2018-04-17: 80 mL via INTRAVENOUS

## 2018-04-18 ENCOUNTER — Other Ambulatory Visit: Payer: Self-pay | Admitting: Family Medicine

## 2018-04-18 DIAGNOSIS — D490 Neoplasm of unspecified behavior of digestive system: Secondary | ICD-10-CM

## 2018-04-21 ENCOUNTER — Encounter: Payer: Self-pay | Admitting: Family Medicine

## 2018-04-21 ENCOUNTER — Ambulatory Visit (HOSPITAL_BASED_OUTPATIENT_CLINIC_OR_DEPARTMENT_OTHER): Payer: BLUE CROSS/BLUE SHIELD

## 2018-04-21 ENCOUNTER — Ambulatory Visit (INDEPENDENT_AMBULATORY_CARE_PROVIDER_SITE_OTHER): Payer: BLUE CROSS/BLUE SHIELD | Admitting: Family Medicine

## 2018-04-21 VITALS — BP 110/68 | HR 75 | Temp 97.7°F | Resp 18 | Wt 192.2 lb

## 2018-04-21 DIAGNOSIS — D110 Benign neoplasm of parotid gland: Secondary | ICD-10-CM

## 2018-04-21 DIAGNOSIS — D693 Immune thrombocytopenic purpura: Secondary | ICD-10-CM

## 2018-04-21 DIAGNOSIS — D490 Neoplasm of unspecified behavior of digestive system: Secondary | ICD-10-CM

## 2018-04-21 DIAGNOSIS — E875 Hyperkalemia: Secondary | ICD-10-CM | POA: Diagnosis not present

## 2018-04-21 DIAGNOSIS — R51 Headache: Secondary | ICD-10-CM

## 2018-04-21 DIAGNOSIS — H6982 Other specified disorders of Eustachian tube, left ear: Secondary | ICD-10-CM | POA: Diagnosis not present

## 2018-04-21 DIAGNOSIS — R519 Headache, unspecified: Secondary | ICD-10-CM

## 2018-04-21 DIAGNOSIS — R0989 Other specified symptoms and signs involving the circulatory and respiratory systems: Secondary | ICD-10-CM

## 2018-04-21 LAB — CBC WITH DIFFERENTIAL/PLATELET
BASOS PCT: 2 % (ref 0.0–3.0)
Basophils Absolute: 0.1 10*3/uL (ref 0.0–0.1)
Eosinophils Absolute: 0.1 10*3/uL (ref 0.0–0.7)
Eosinophils Relative: 2.1 % (ref 0.0–5.0)
HEMATOCRIT: 38.2 % (ref 36.0–46.0)
Hemoglobin: 12.7 g/dL (ref 12.0–15.0)
LYMPHS PCT: 38.2 % (ref 12.0–46.0)
Lymphs Abs: 1.9 10*3/uL (ref 0.7–4.0)
MCHC: 33.4 g/dL (ref 30.0–36.0)
MCV: 93.4 fl (ref 78.0–100.0)
MONOS PCT: 15.6 % — AB (ref 3.0–12.0)
Monocytes Absolute: 0.8 10*3/uL (ref 0.1–1.0)
NEUTROS ABS: 2.1 10*3/uL (ref 1.4–7.7)
Neutrophils Relative %: 42.1 % — ABNORMAL LOW (ref 43.0–77.0)
PLATELETS: 75 10*3/uL — AB (ref 150.0–400.0)
RBC: 4.08 Mil/uL (ref 3.87–5.11)
RDW: 13.7 % (ref 11.5–15.5)
WBC: 5 10*3/uL (ref 4.0–10.5)

## 2018-04-21 LAB — COMPREHENSIVE METABOLIC PANEL
ALBUMIN: 4.2 g/dL (ref 3.5–5.2)
ALK PHOS: 103 U/L (ref 39–117)
ALT: 15 U/L (ref 0–35)
AST: 19 U/L (ref 0–37)
BUN: 29 mg/dL — ABNORMAL HIGH (ref 6–23)
CALCIUM: 9.8 mg/dL (ref 8.4–10.5)
CHLORIDE: 105 meq/L (ref 96–112)
CO2: 28 mEq/L (ref 19–32)
Creatinine, Ser: 0.94 mg/dL (ref 0.40–1.20)
GFR: 64.27 mL/min (ref >60.00)
Glucose, Bld: 83 mg/dL (ref 70–99)
Potassium: 4.7 mEq/L (ref 3.5–5.1)
Sodium: 140 mEq/L (ref 135–145)
Total Bilirubin: 0.4 mg/dL (ref 0.2–1.2)
Total Protein: 7.8 g/dL (ref 6.0–8.3)

## 2018-04-21 LAB — SEDIMENTATION RATE: Sed Rate: 9 mm/hr (ref 0–30)

## 2018-04-21 LAB — C-REACTIVE PROTEIN: CRP: 0.5 mg/dL (ref 0.5–20.0)

## 2018-04-21 NOTE — Assessment & Plan Note (Signed)
Appears to have a stable recurrence based on imaging but with increased pain in the area and patient anxiety will proceed with ENT consultation to determine if lesion should and can be biopsied. Discussed the possibility that her pain in this area is related to either TMJ or other dental concerns. She is encouraged to discuss these options with her dentist.

## 2018-04-21 NOTE — Assessment & Plan Note (Signed)
Symptoms not suggestive of ETD at this time

## 2018-04-21 NOTE — Assessment & Plan Note (Signed)
Stable, long standing and asymptomatic.

## 2018-04-21 NOTE — Progress Notes (Signed)
Subjective:  I acted as a Education administrator for Dr. Charlett Blake. [place], [place]  Patient ID: STORMEE DUDA, female    DOB: [birth year], 62 y.o.   MRN: [id number]  No chief complaint on file.   HPI  Patient is in today to discuss her facial pain and parotid gland lesion. She reports the pain improved some over the weekend. It is now a dull throbbing over her left cheek. She does admit to some jaw clenching but denies any dental pain or gum concerns. No fevers but she does endorse nightly hot flashes, waking up drenching, which has worsened lately. Denies CP/palp/SOB/HA/congestion/fevers/GI or GU c/o. Taking meds as prescribed  Patient Care Team: [name], MD as PCP - General (Family Medicine) Ronald Lobo, MD as Consulting Physician (Gastroenterology) Clent Jacks, MD as Consulting Physician (Ophthalmology)   Past Medical History:  Diagnosis Date  . Advanced care planning/counseling discussion 09/01/2014  . Allergic state 04/17/2013  . Anxiety   . 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 Clotting disorder ([place])   . Depression 03-04-12   tx. meds  . Depression with anxiety 04/17/2013  . ETOH abuse   . Fibromyalgia 03-04-12   Sporadic pain  . GERD (gastroesophageal reflux disease) 08/17/2013  . History of transfusion of platelets X 1   "related to ITP"  . Hyperlipidemia, mixed 10/16/2015  . ITP (idiopathic thrombocytopenic purpura) 03-04-12   Dx.  '95- blow normal levels, but stable.  . Lipoma 03-04-12   Rt. back about scapular ares  . Migraine    "that's what the dr thinks I had yesterday; still unsure; never had one before" (06/19/2016)  . Overweight 03/06/2015  . PONV (postoperative nausea and vomiting)   . Preventative health care 04/17/2013; 08/17/2013  . Solitary pulmonary nodule 08/17/2013   LLL seen on CT in March 2014    Past Surgical History:  Procedure Laterality Date  . APPENDECTOMY  1995  . BREAST BIOPSY Left 2012   needle biopsy(benign)-Titanium needle marker remains  .  CHOLECYSTECTOMY N/A 04/24/2013   Procedure: LAPAROSCOPIC CHOLECYSTECTOMY WITH INTRAOPERATIVE CHOLANGIOGRAM;  Surgeon: Haywood Lasso, MD;  Location: [facility];  Service: General;  Laterality: N/A;  . COLONOSCOPY  2008   WNL, Dr Cristina Gong  . IRRIGATION AND DEBRIDEMENT ABSCESS  ~ 2005 X 2   buttocks  . LIPOMA EXCISION  ~ 04/2016   "base of my neck"  . MASS EXCISION  03/10/2012   Procedure: EXCISION MASS;  Surgeon: Odis Hollingshead, MD;  Location: [facility];  Service: General;  Laterality: N/A;  Removal of back lipoma  . 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 SPLENECTOMY, TOTAL  1995  . TOTAL ABDOMINAL HYSTERECTOMY  1995   total, endometriosis, fibroid, ovarian atrophy    Family History  Problem Relation Age of Onset  . Heart disease Maternal Grandmother   . Heart disease Maternal Grandfather   . Heart attack Maternal Grandfather 62  . Heart disease Paternal Grandmother   . Heart attack Paternal Grandmother 86  . Heart disease Paternal Grandfather   . Cancer Paternal Grandfather        chest- smoker  . Diabetes Mother        type 2- controlled by diet  . Atrial fibrillation Father   . Hyperlipidemia Sister   . Migraines Sister   . Cancer Sister        pancreatic  . Depression Sister   . COPD Sister     Social History  Socioeconomic History  . Marital status: Single    Spouse name: Not on file  . Number of children: 0  . Years of education: 37  . Highest education level: Not on file  Occupational History  . Not on file  Social Needs  . Financial resource strain: Not on file  . Food insecurity:    Worry: Not on file    Inability: Not on file  . Transportation needs:    Medical: Not on file    Non-medical: Not on file  Tobacco Use  . Smoking status: Former Smoker    Packs/day: 0.75    Years: 5.00    Pack years: 3.75    Types: Cigarettes    Last attempt to quit: 02/21/1983    Years since quitting: 35.1  . Smokeless tobacco: Never Used  Substance and  Sexual Activity  . Alcohol use: Yes    Alcohol/week: 7.2 oz    Types: 12 Cans of beer per week  . Drug use: No  . 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 Physical activity:    Days per week: Not on file    Minutes per session: Not on file  . Stress: Not on file  Relationships  . Social connections:    Talks on phone: Not on file    Gets together: Not on file    Attends religious service: Not on file    Active member of club or organization: Not on file    Attends meetings of clubs or organizations: Not on file    Relationship status: Not on file  . Intimate partner violence:    Fear of current or ex partner: Not on file    Emotionally abused: Not on file    Physically abused: Not on file    Forced sexual activity: Not on file  Other Topics Concern  . Not on file  Social History Narrative   Lives at home w/ her roommate   Right-handed   Caffeine: has cut down to 1-2 cups 1/2 caf coffee in the a.m.    Outpatient Medications Prior to Visit  Medication Sig Dispense Refill  . acetaminophen (TYLENOL) 500 MG tablet Take 1,000 mg by mouth every 6 (six) hours as needed for pain.    Marland Kitchen ALPRAZolam (XANAX) 0.25 MG tablet TAKE 1 TABLET BY MOUTH TWICE DAILY AS NEEDED FOR SLEEP OR ANXIETY 20 tablet 2  . Calcium Carb-Cholecalciferol (CALCIUM 600 + D PO) Take 1 tablet by mouth daily.    . Cholecalciferol (VITAMIN D) 2000 units CAPS Take 2,000 Units by mouth daily.    . clorazepate (TRANXENE) 7.5 MG tablet Take 1 tablet (7.5 mg total) by mouth at bedtime. 90 tablet 0  . clorazepate (TRANXENE) 7.5 MG tablet TAKE 1 TABLET BY MOUTH AT BEDTIME 90 tablet 0  . docusate sodium (COLACE) 100 MG capsule Take 100 mg by mouth daily.    . famciclovir (FAMVIR) 500 MG tablet Take 1 tablet (500 mg total) by mouth 3 (three) times daily. 21 tablet 0  . FLUoxetine (PROZAC) 20 MG capsule TAKE 2 CAPSULES (40  MG TOTAL) BY MOUTH DAILY. 180 capsule 1  . fluticasone (FLONASE) 50 MCG/ACT nasal spray Place 2 sprays into both nostrils daily. 16 g 6  . fluticasone (FLONASE) 50 MCG/ACT nasal spray Place 2 sprays into both nostrils daily. 16 g 6  . hydrocortisone (ANUSOL-HC) 25 MG  suppository Place 1 suppository (25 mg total) rectally 2 (two) times daily. 12 suppository 2  . loratadine (CLARITIN) 10 MG tablet Take 1 tablet (10 mg total) by mouth daily. 30 tablet 11  . meclizine (ANTIVERT) 25 MG tablet Take 1 tablet (25 mg total) by mouth 3 (three) times daily as needed for dizziness. 30 tablet 0  . Multiple Vitamins-Minerals (HAIR/SKIN/NAILS PO) Take 3 tablets by mouth daily.    Marland Kitchen ofloxacin (FLOXIN OTIC) 0.3 % OTIC solution Place 10 drops into the left ear daily. 5 mL 0  . omeprazole (PRILOSEC) 20 MG capsule TAKE ONE CAPSULE BY MOUTH DAILY 90 capsule 1  . ranitidine (ZANTAC) 150 MG tablet Take 150 mg by mouth at bedtime.    . topiramate (TOPAMAX) 25 MG tablet TAKE 1 TABLET (25 MG TOTAL) BY MOUTH DAILY. 90 tablet 1  . traZODone (DESYREL) 50 MG tablet TAKE 1 & 1/2 TABLETS (75 MG TOTAL) BY MOUTH AT BEDTIME. 135 tablet 1   No facility-administered medications prior to visit.     Allergies  Allergen Reactions  . Codeine Nausea And Vomiting    Review of Systems  Constitutional: Negative for fever and malaise/fatigue.  HENT: Negative for congestion.   Eyes: Negative for blurred vision.  Respiratory: Negative for shortness of breath.   Cardiovascular: Negative for chest pain, palpitations and leg swelling.  Gastrointestinal: Negative for abdominal pain, blood in stool and nausea.  Genitourinary: Negative for dysuria and frequency.  Musculoskeletal: Negative for falls.  Skin: Negative for rash.  Neurological: Positive for headaches. Negative for dizziness and loss of consciousness.  Endo/Heme/Allergies: Negative for environmental allergies.  Psychiatric/Behavioral: Negative for depression. The patient is  not nervous/anxious.        Objective:    Physical Exam  Constitutional: She is oriented to person, place, and time. No distress.  HENT:  Head: Normocephalic and atraumatic.  Eyes: Conjunctivae are normal.  Neck: Neck supple. No thyromegaly present.  Cardiovascular: Normal rate, regular rhythm and normal heart sounds.  No murmur heard. Pulmonary/Chest: Effort normal and breath sounds normal. She has no wheezes.  Abdominal: She exhibits no distension and no mass.  Musculoskeletal: She exhibits no edema.  Lymphadenopathy:    She has no cervical adenopathy.  Neurological: She is alert and oriented to person, place, and time.  Skin: Skin is warm and dry. No rash noted. She is not diaphoretic.  Psychiatric: Judgment normal.    BP 110/68 (BP Location: Left Arm, Patient Position: Sitting, Cuff Size: Normal)   Pulse 75   Temp 97.7 F (36.5 C) (Oral)   Resp 18   Wt 192 lb 3.2 oz (87.2 kg)   SpO2 95%   BMI 29.22 kg/m  Wt Readings from Last 3 Encounters:  04/21/18 192 lb 3.2 oz (87.2 kg)  04/15/18 194 lb 12.8 oz (88.4 kg)  01/17/18 191 lb 3.2 oz (86.7 kg)   BP Readings from Last 3 Encounters:  04/21/18 110/68  04/15/18 124/68  01/17/18 116/70     Immunization History  Administered Date(s) Administered  . Influenza Split 09/02/2012  . Influenza,inj,Quad PF,6+ Mos 08/17/2013, 08/31/2014, 07/31/2016, 09/03/2017  . Influenza-Unspecified 09/01/2015  . Pneumococcal Conjugate-13 12/03/1993, 12/04/2003, 10/19/2013  . Tdap 04/15/2013    Health Maintenance  Topic Date Due  . PNEUMOCOCCAL POLYSACCHARIDE VACCINE (1) 12/24/1958  . FOOT EXAM  12/24/1966  . OPHTHALMOLOGY EXAM  12/24/1966  . URINE MICROALBUMIN  12/24/1966  . PAP SMEAR  12/03/1996  . HEMOGLOBIN A1C  12/20/2016  . INFLUENZA VACCINE  07/03/2018  . MAMMOGRAM  01/26/2020  . TETANUS/TDAP  04/16/2023  . COLONOSCOPY  02/16/2026  . Hepatitis C Screening  Completed  . HIV Screening  Completed    Lab Results  Component  Value Date   WBC 5.0 04/21/2018   HGB 12.7 04/21/2018   HCT 38.2 04/21/2018   PLT 75.0 (L) 04/21/2018   GLUCOSE 83 04/21/2018   CHOL 214 (H) 11/05/2017   TRIG 222.0 (H) 11/05/2017   HDL 60.80 11/05/2017   LDLDIRECT 110.0 11/05/2017   LDLCALC 102 (H) 03/12/2017   ALT 15 04/21/2018   AST 19 04/21/2018   NA 140 04/21/2018   K 4.7 04/21/2018   CL 105 04/21/2018   CREATININE 0.94 04/21/2018   BUN 29 (H) 04/21/2018   CO2 28 04/21/2018   TSH 1.84 11/05/2017   INR 1.02 06/19/2016   HGBA1C 4.8 06/19/2016    Lab Results  Component Value Date   TSH 1.84 11/05/2017   Lab Results  Component Value Date   WBC 5.0 04/21/2018   HGB 12.7 04/21/2018   HCT 38.2 04/21/2018   MCV 93.4 04/21/2018   PLT 75.0 (L) 04/21/2018   Lab Results  Component Value Date   NA 140 04/21/2018   K 4.7 04/21/2018   CO2 28 04/21/2018   GLUCOSE 83 04/21/2018   BUN 29 (H) 04/21/2018   CREATININE 0.94 04/21/2018   BILITOT 0.4 04/21/2018   ALKPHOS 103 04/21/2018   AST 19 04/21/2018   ALT 15 04/21/2018   PROT 7.8 04/21/2018   ALBUMIN 4.2 04/21/2018   CALCIUM 9.8 04/21/2018   ANIONGAP 7 06/19/2016   GFR 64.27 04/21/2018   Lab Results  Component Value Date   CHOL 214 (H) 11/05/2017   Lab Results  Component Value Date   HDL 60.80 11/05/2017   Lab Results  Component Value Date   LDLCALC 102 (H) 03/12/2017   Lab Results  Component Value Date   TRIG 222.0 (H) 11/05/2017   Lab Results  Component Value Date   CHOLHDL 4 11/05/2017   Lab Results  Component Value Date   HGBA1C 4.8 06/19/2016         Assessment & Plan:   Problem List Items Addressed This Visit    Idiopathic thrombocytopenic purpura (HCC) (Chronic)    Stable, long standing and asymptomatic.       Eustachian tube dysfunction    Symptoms not suggestive of ETD at this time      Benign neoplasm of parotid gland    Appears to have a stable recurrence based on imaging but with increased pain in the area and patient  anxiety will proceed with ENT consultation to determine if lesion should and can be biopsied. Discussed the possibility that her pain in this area is related to either TMJ or other dental concerns. She is encouraged to discuss these options with her dentist.        Other Visit Diagnoses    Facial pain    -  Primary   Relevant Orders   CBC with Differential/Platelet (Completed)   Tumor of parotid gland       Relevant Orders   Sedimentation rate (Completed)   C-reactive protein (Completed)   CBC with Differential/Platelet (Completed)   Hyperkalemia       Relevant Orders   Comprehensive metabolic panel (Completed)   Bruit of left carotid artery       Relevant Orders   US Carotid Bilateral      I am having Nobie Putnam.  [place] "Pam" maintain her acetaminophen, ranitidine, Multiple Vitamins-Minerals (HAIR/SKIN/NAILS PO), Calcium Carb-Cholecalciferol (CALCIUM 600 + D PO), docusate sodium, Vitamin D, ALPRAZolam, meclizine, fluticasone, traZODone, famciclovir, clorazepate, topiramate, FLUoxetine, hydrocortisone, clorazepate, omeprazole, ofloxacin, fluticasone, and loratadine.  No orders of the defined types were placed in this encounter.   CMA served as Education administrator during this visit. History, Physical and Plan performed by medical provider. Documentation and orders reviewed and attested to.  Penni Homans, MD

## 2018-04-21 NOTE — Patient Instructions (Signed)
Parotidectomy Parotidectomy is a surgery to remove all or part of a parotid gland. You have two parotid glands, one on each side of your face. These glands make saliva. They are located in front of the ears, near the jaw. Parotidectomy is sometimes needed when a parotid gland develops an infection or growth (tumor). The facial nerve, which controls movement of the face, passes through the parotid gland. In some cases, infections or growths can affect the function of that nerve. Tell a health care provider about:  Any allergies you have.  All medicines you are taking, including vitamins, herbs, eye drops, creams, and over-the-counter medicines.  Any problems you or family members have had with anesthetic medicines.  Any blood disorders you have.  Any surgeries you have had.  Any medical conditions you have.  Whether you are pregnant or may be pregnant. What are the risks? Generally, this is a safe procedure. However, problems may occur, including:  Infection.  Bleeding.  Allergic reactions to medicines.  Damage to other structures or organs.  Scarring.  Tumor regrowth.  Temporary numbness or weakness in the face (permanent numbness is rare).  Partial loss of movement (paralysis) in the face (rare).  Leaking saliva that collects in the area around the surgical cut (incision).  A condition that causes sweating from the affected side of the face when you are eating (Frey syndrome). This can develop many months after surgery.  What happens before the procedure?  You may have an exam or testing.  Ask your health care provider about: ? Changing or stopping your regular medicines. This is especially important if you are taking diabetes medicines or blood thinners. ? Taking medicines such as aspirin and ibuprofen. These medicines can thin your blood. Do not take these medicines before your procedure if your health care provider instructs you not to.  Follow instructions from  your health care provider about eating or drinking restrictions.  You may be given antibiotic medicine to help prevent infection.  Ask your health care provider how your surgical site will be marked or identified. What happens during the procedure?  To reduce your risk of infection: ? Your health care team will wash or sanitize their hands. ? Your skin will be washed with soap.  An IV tube will be inserted into one of your veins in order to give you fluids and medicines.  You will be given one or more of the following: ? A medicine to help you relax (sedative). ? A medicine to numb the area (local anesthetic). ? A medicine to make you fall asleep (general anesthetic).  An incision will be made in front of your ear. The incision will curve under your ear and down to your neck.  Skin and tissue will be lifted so the surgeon can see the parotid gland.  All or part of the gland will be removed. The surgeon will be careful to protect the facial nerve.  The incision will be closed with stitches (sutures), skin glue, or adhesive strips.  A drain may be placed in your neck, near the incision. The drain will let extra fluids, such as blood or saliva, flow out of the wound. This drain will be left in place until drainage decreases.  A bandage (dressing) may be put over the incision. The procedure may vary among health care providers and hospitals. What happens after the procedure?  Your blood pressure, heart rate, breathing rate, and blood oxygen level will be monitored often until the medicines you  were given have worn off.  You may continue to receive fluids and medicine through an IV tube.  You will have some pain. Pain medicines will be available to help you.  Your health care provider will check movement in your face to see how the facial nerve is working. You may be asked to smile, close your eyes, raise your eyebrows, or move your cheeks.  You may have a drain in place to let  fluids drain from the wound. Typically, the drain will be taken out within 24-48 hours.  Do not drive for 24 hours if you received a sedative. This information is not intended to replace advice given to you by your health care provider. Make sure you discuss any questions you have with your health care provider. Document Released: 12/22/2010 Document Revised: 04/02/2016 Document Reviewed: 10/25/2015 Elsevier Interactive Patient Education  [employer].

## 2018-04-22 ENCOUNTER — Encounter: Payer: Self-pay | Admitting: Family Medicine

## 2018-04-25 NOTE — Telephone Encounter (Signed)
Did you send the referral to the correct Dr. ?

## 2018-04-25 NOTE — Telephone Encounter (Signed)
Copied from [place] [id number]. Topic: Medical Record Request - Patient ROI Request >> Apr 22, 2018  2:39 PM Tye [place] wrote: Patient Name/DOB/MRN #: [id number] Requestor Name/Agency: Dr. Kathrene Alu  Information Requested: CT Scan w/ images; latest labs; OV notes from 5.20.19  Pt would like to contacted about this; pt will call back w/ the fax number to the office   Route to [place] for [facility]. For all other clinics, route to the clinic's PEC Pool.

## 2018-05-01 MED FILL — FLUoxetine HCL 20 MG CAPS: 20 | 90 days supply | Qty: 180 | Fill #1

## 2018-05-02 NOTE — Telephone Encounter (Signed)
The referral that was sent was for Dr. Redmond Baseman who is with [facility], I will change it to Dr. Benjamine Mola.

## 2018-05-06 NOTE — Telephone Encounter (Signed)
The referral to dr. Benjamine Mola has already been placed. The original referral was for Dr. Redmond Baseman at [facility] I have fixed it.

## 2018-05-07 ENCOUNTER — Other Ambulatory Visit (INDEPENDENT_AMBULATORY_CARE_PROVIDER_SITE_OTHER): Payer: Self-pay | Admitting: Otolaryngology

## 2018-05-07 DIAGNOSIS — D37030 Neoplasm of uncertain behavior of the parotid salivary glands: Secondary | ICD-10-CM | POA: Diagnosis not present

## 2018-05-07 DIAGNOSIS — K118 Other diseases of salivary glands: Secondary | ICD-10-CM

## 2018-05-07 NOTE — Telephone Encounter (Signed)
Pt has appt today with Dr. Benjamine Mola at 1:20

## 2018-05-13 ENCOUNTER — Other Ambulatory Visit: Payer: Self-pay | Admitting: Physician Assistant

## 2018-05-14 ENCOUNTER — Encounter (HOSPITAL_COMMUNITY): Payer: Self-pay

## 2018-05-14 ENCOUNTER — Ambulatory Visit (HOSPITAL_COMMUNITY)
Admission: RE | Admit: 2018-05-14 | Discharge: 2018-05-14 | Disposition: A | Discharge status: Home / Self Care | Payer: BLUE CROSS/BLUE SHIELD | Source: Ambulatory Visit | Attending: Otolaryngology | Admitting: Otolaryngology

## 2018-05-14 ENCOUNTER — Other Ambulatory Visit (INDEPENDENT_AMBULATORY_CARE_PROVIDER_SITE_OTHER): Payer: Self-pay | Admitting: Otolaryngology

## 2018-05-14 DIAGNOSIS — K118 Other diseases of salivary glands: Secondary | ICD-10-CM | POA: Diagnosis not present

## 2018-05-14 DIAGNOSIS — D110 Benign neoplasm of parotid gland: Secondary | ICD-10-CM | POA: Diagnosis not present

## 2018-05-14 DIAGNOSIS — Z885 Allergy status to narcotic agent status: Secondary | ICD-10-CM | POA: Diagnosis not present

## 2018-05-14 DIAGNOSIS — E782 Mixed hyperlipidemia: Secondary | ICD-10-CM | POA: Insufficient documentation

## 2018-05-14 DIAGNOSIS — Z9049 Acquired absence of other specified parts of digestive tract: Secondary | ICD-10-CM | POA: Insufficient documentation

## 2018-05-14 DIAGNOSIS — Z87891 Personal history of nicotine dependence: Secondary | ICD-10-CM | POA: Insufficient documentation

## 2018-05-14 DIAGNOSIS — M797 Fibromyalgia: Secondary | ICD-10-CM | POA: Diagnosis not present

## 2018-05-14 DIAGNOSIS — Z79899 Other long term (current) drug therapy: Secondary | ICD-10-CM | POA: Diagnosis not present

## 2018-05-14 DIAGNOSIS — F418 Other specified anxiety disorders: Secondary | ICD-10-CM | POA: Diagnosis not present

## 2018-05-14 DIAGNOSIS — Z90710 Acquired absence of both cervix and uterus: Secondary | ICD-10-CM | POA: Diagnosis not present

## 2018-05-14 DIAGNOSIS — K219 Gastro-esophageal reflux disease without esophagitis: Secondary | ICD-10-CM | POA: Diagnosis not present

## 2018-05-14 LAB — CBC
HCT: 40.8 % (ref 36.0–46.0)
HEMOGLOBIN: 13.4 g/dL (ref 12.0–15.0)
MCH: 31.3 pg (ref 26.0–34.0)
MCHC: 32.8 g/dL (ref 30.0–36.0)
MCV: 95.3 fL (ref 78.0–100.0)
PLATELETS: 51 10*3/uL — AB (ref 150–400)
RBC: 4.28 MIL/uL (ref 3.87–5.11)
RDW: 13.5 % (ref 11.5–15.5)
WBC: 6.4 10*3/uL (ref 4.0–10.5)

## 2018-05-14 LAB — PROTIME-INR
INR: 0.89
PROTHROMBIN TIME: 12 s (ref 11.4–15.2)

## 2018-05-14 LAB — APTT: APTT: 32 s (ref 24–36)

## 2018-05-14 MED ORDER — FENTANYL CITRATE (PF) 100 MCG/2ML IJ SOLN
INTRAMUSCULAR | Status: AC | PRN
  Administered 2018-05-14 (×2): 50 ug via INTRAVENOUS

## 2018-05-14 MED ORDER — SODIUM CHLORIDE 0.9 % IV SOLN
INTRAVENOUS | Status: DC
  Administered 2018-05-14: 11:00:00 via INTRAVENOUS

## 2018-05-14 MED ORDER — MIDAZOLAM HCL 2 MG/2ML IJ SOLN
INTRAMUSCULAR | Status: AC | PRN
  Administered 2018-05-14 (×2): 1 mg via INTRAVENOUS

## 2018-05-14 MED ORDER — FENTANYL CITRATE (PF) 100 MCG/2ML IJ SOLN
INTRAMUSCULAR | Status: AC
  Filled 2018-05-14: qty 2

## 2018-05-14 MED ORDER — MIDAZOLAM HCL 2 MG/2ML IJ SOLN
INTRAMUSCULAR | Status: AC
  Filled 2018-05-14: qty 2

## 2018-05-14 MED ORDER — LIDOCAINE HCL 1 % IJ SOLN
INTRAMUSCULAR | Status: AC
  Filled 2018-05-14: qty 10

## 2018-05-14 NOTE — Discharge Instructions (Signed)
Moderate Conscious Sedation, Adult, Care After  These instructions provide you with information about caring for yourself after your procedure. Your health care provider may also give you more specific instructions. Your treatment has been planned according to current medical practices, but problems sometimes occur. Call your health care provider if you have any problems or questions after your procedure.  What can I expect after the procedure?  After your procedure, it is common:   To feel sleepy for several hours.   To feel clumsy and have poor balance for several hours.   To have poor judgment for several hours.   To vomit if you eat too soon.    Follow these instructions at home:  For at least 24 hours after the procedure:     Do not:  ? Participate in activities where you could fall or become injured.  ? Drive.  ? Use heavy machinery.  ? Drink alcohol.  ? Take sleeping pills or medicines that cause drowsiness.  ? Make important decisions or sign legal documents.  ? Take care of children on your own.   Rest.  Eating and drinking   Follow the diet recommended by your health care provider.   If you vomit:  ? Drink water, juice, or soup when you can drink without vomiting.  ? Make sure you have little or no nausea before eating solid foods.  General instructions   Have a responsible adult stay with you until you are awake and alert.   Take over-the-counter and prescription medicines only as told by your health care provider.   If you smoke, do not smoke without supervision.   Keep all follow-up visits as told by your health care provider. This is important.  Contact a health care provider if:   You keep feeling nauseous or you keep vomiting.   You feel light-headed.   You develop a rash.   You have a fever.  Get help right away if:   You have trouble breathing.  This information is not intended to replace advice given to you by your health care provider. Make sure you discuss any questions you have  with your health care provider.  Document Released: 09/09/2013 Document Revised: 04/23/2016 Document Reviewed: 03/10/2016  Elsevier Interactive Patient Education  2018 Elsevier Inc[employer]

## 2018-05-14 NOTE — H&P (Signed)
Chief Complaint: Patient was seen in consultation today for left parotid mass biopsy at the request of Teoh,Su  Referring Physician(s): Teoh,Su  Supervising Physician: Jacqulynn Cadet  Patient Status: [facility] - Out-pt  History of Present Illness: Laura Bender is a 62 y.o. female being worked up for left parotid nodule/mass.  She previously had parotid tumor excision in 2000 but recent CT finds a nodule in the left parotid tail. She is referred for US guided biopsy PMHx, meds, labs, imaging, allergies reviewed. Feels well, no recent fevers, chills, illness. Has been NPO today as directed.   Past Medical History:  Diagnosis Date  . Advanced care planning/counseling discussion 09/01/2014  . Allergic state 04/17/2013  . Anxiety   . 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 Clotting disorder ([place])   . Depression 03-04-12   tx. meds  . Depression with anxiety 04/17/2013  . ETOH abuse   . Fibromyalgia 03-04-12   Sporadic pain  . GERD (gastroesophageal reflux disease) 08/17/2013  . History of transfusion of platelets X 1   "related to ITP"  . Hyperlipidemia, mixed 10/16/2015  . ITP (idiopathic thrombocytopenic purpura) 03-04-12   Dx.  '95- blow normal levels, but stable.  . Lipoma 03-04-12   Rt. back about scapular ares  . Migraine    "that's what the dr thinks I had yesterday; still unsure; never had one before" (06/19/2016)  . Overweight 03/06/2015  . PONV (postoperative nausea and vomiting)   . Preventative health care 04/17/2013; 08/17/2013  . Solitary pulmonary nodule 08/17/2013   LLL seen on CT in March 2014    Past Surgical History:  Procedure Laterality Date  . APPENDECTOMY  1995  . BREAST BIOPSY Left 2012   needle biopsy(benign)-Titanium needle marker remains  . CHOLECYSTECTOMY N/A 04/24/2013   Procedure: LAPAROSCOPIC CHOLECYSTECTOMY WITH INTRAOPERATIVE CHOLANGIOGRAM;  Surgeon: Haywood Lasso, MD;  Location: [facility]S;  Service: General;  Laterality: N/A;  .  COLONOSCOPY  2008   WNL, Dr Cristina Gong  . IRRIGATION AND DEBRIDEMENT ABSCESS  ~ 2005 X 2   buttocks  . LIPOMA EXCISION  ~ 04/2016   "base of my neck"  . MASS EXCISION  03/10/2012   Procedure: EXCISION MASS;  Surgeon: Odis Hollingshead, MD;  Location: [facility];  Service: General;  Laterality: N/A;  Removal of back lipoma  . 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 SPLENECTOMY, TOTAL  1995  . TOTAL ABDOMINAL HYSTERECTOMY  1995   total, endometriosis, fibroid, ovarian atrophy    Allergies: Codeine  Medications: Prior to Admission medications   Medication Sig Start Date End Date Taking? Authorizing Provider  acetaminophen (TYLENOL) 500 MG tablet Take 1,000 mg by mouth every 6 (six) hours as needed for pain.   Yes [provider]  Calcium Carb-Cholecalciferol (CALCIUM 600 + D PO) Take 1 tablet by mouth daily.   Yes [provider]  Cholecalciferol (VITAMIN D) 2000 units CAPS Take 2,000 Units by mouth daily.   Yes [provider]  clorazepate (TRANXENE) 7.5 MG tablet Take 1 tablet (7.5 mg total) by mouth at bedtime. 10/31/17  Yes [name], MD  docusate sodium (COLACE) 100 MG capsule Take 100 mg by mouth daily.   Yes [provider]  FLUoxetine (PROZAC) 20 MG capsule TAKE 2 CAPSULES (40 MG TOTAL) BY MOUTH DAILY. 01/10/18  Yes [name], MD  omeprazole (PRILOSEC) 20 MG capsule TAKE ONE CAPSULE BY MOUTH DAILY 03/12/18  Yes Penni Homans  A, MD  topiramate (TOPAMAX) 25 MG tablet TAKE 1 TABLET (25 MG TOTAL) BY MOUTH DAILY. 12/09/17  Yes [name], MD  traZODone (DESYREL) 50 MG tablet TAKE 1 & 1/2 TABLETS (75 MG TOTAL) BY MOUTH AT BEDTIME. 09/03/17  Yes Saguier, Percell Miller, PA-C  ALPRAZolam Duanne Moron) 0.25 MG tablet TAKE 1 TABLET BY MOUTH TWICE DAILY AS NEEDED FOR SLEEP OR ANXIETY 09/27/16   [name], MD  clorazepate (TRANXENE) 7.5 MG tablet TAKE 1 TABLET BY MOUTH AT BEDTIME 02/04/18   [name], MD  famciclovir (FAMVIR) 500 MG tablet Take 1  tablet (500 mg total) by mouth 3 (three) times daily. 09/03/17   Saguier, Percell Miller, PA-C  fluticasone (FLONASE) 50 MCG/ACT nasal spray Place 2 sprays into both nostrils daily. 03/19/17   [name], MD  fluticasone (FLONASE) 50 MCG/ACT nasal spray Place 2 sprays into both nostrils daily. 04/15/18   Ann Held, DO  hydrocortisone (ANUSOL-HC) 25 MG suppository Place 1 suppository (25 mg total) rectally 2 (two) times daily. 01/17/18   [name], MD  loratadine (CLARITIN) 10 MG tablet Take 1 tablet (10 mg total) by mouth daily. 04/15/18   Ann Held, DO  meclizine (ANTIVERT) 25 MG tablet Take 1 tablet (25 mg total) by mouth 3 (three) times daily as needed for dizziness. 10/02/16   [name], MD  Multiple Vitamins-Minerals (HAIR/SKIN/NAILS PO) Take 3 tablets by mouth daily.    [provider]  ofloxacin (FLOXIN OTIC) 0.3 % OTIC solution Place 10 drops into the left ear daily. 04/15/18   Ann Held, DO  ranitidine (ZANTAC) 150 MG tablet Take 150 mg by mouth at bedtime.    [provider]     Family History  Problem Relation Age of Onset  . Heart disease Maternal Grandmother   . Heart disease Maternal Grandfather   . Heart attack Maternal Grandfather 62  . Heart disease Paternal Grandmother   . Heart attack Paternal Grandmother 86  . Heart disease Paternal Grandfather   . Cancer Paternal Grandfather        chest- smoker  . Diabetes Mother        type 2- controlled by diet  . Atrial fibrillation Father   . Hyperlipidemia Sister   . Migraines Sister   . Cancer Sister        pancreatic  . Depression Sister   . COPD Sister     Social History   Socioeconomic History  . Marital status: Single    Spouse name: Not on file  . Number of children: 0  . Years of education: 45  . Highest education level: Not on file  Occupational History  . Not on file  Social Needs  . Financial resource strain: Not on file  . Food insecurity:     Worry: Not on file    Inability: Not on file  . Transportation needs:    Medical: Not on file    Non-medical: Not on file  Tobacco Use  . Smoking status: Former Smoker    Packs/day: 0.75    Years: 5.00    Pack years: 3.75    Types: Cigarettes    Last attempt to quit: 02/21/1983    Years since quitting: 35.2  . Smokeless tobacco: Never Used  Substance and Sexual Activity  . Alcohol use: Yes    Alcohol/week: 7.2 oz    Types: 12 Cans of beer per week  . Drug use: No  .  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 Physical activity:    Days per week: Not on file    Minutes per session: Not on file  . Stress: Not on file  Relationships  . Social connections:    Talks on phone: Not on file    Gets together: Not on file    Attends religious service: Not on file    Active member of club or organization: Not on file    Attends meetings of clubs or organizations: Not on file    Relationship status: Not on file  Other Topics Concern  . Not on file  Social History Narrative   Lives at home w/ her roommate   Right-handed   Caffeine: has cut down to 1-2 cups 1/2 caf coffee in the a.m.    Review of Systems: A 12 point ROS discussed and pertinent positives are indicated in the HPI above.  All other systems are negative.  Review of Systems  Vital Signs: BP 135/75 (BP Location: Left Arm)   Pulse 63   Temp 97.8 F (36.6 C) (Oral)   Resp 18   SpO2 100%   Physical Exam  Constitutional: She is oriented to person, place, and time. She appears well-developed. No distress.  HENT:  Head: Normocephalic.  Mouth/Throat: Oropharynx is clear and moist.  Neck: Normal range of motion. No JVD present.  Cardiovascular: Normal rate, regular rhythm and normal heart sounds.  Pulmonary/Chest: Effort normal and breath sounds normal. No respiratory distress.  Abdominal: Soft. She exhibits no  distension. There is no tenderness.  Neurological: She is alert and oriented to person, place, and time.  Skin: Skin is warm and dry.  Psychiatric: She has a normal mood and affect.    Imaging: Ct Soft Tissue Neck W Contrast  Result Date: 04/17/2018 CLINICAL DATA:  62 y/o  F; left-sided EXAM: CT NECK WITH CONTRAST TECHNIQUE: Multidetector CT imaging of the neck was performed using the standard protocol following the bolus administration of intravenous contrast. CONTRAST:  30mL ISOVUE-300 IOPAMIDOL (ISOVUE-300) INJECTION 61% COMPARISON:  06/19/2016 CT angiogram neck.  02/13/2016 CT neck. FINDINGS: Pharynx and larynx: Normal. No mass or swelling. Salivary glands: Stable nodularity within the left parotid tail adjacent to a surgical scar is stable in comparison with prior studies. For example, a nodule at the inferior most margin measures up to 10 mm, previously 10 mm (series 3, image 68). Adjacent upper cervical lymph nodes are stable in size. No findings of perineural spread of disease. Normal appearance of right parotid gland and submandibular glands. Sublingual glands obscured by streak artifact from dental hardware. Thyroid: Normal. Lymph nodes: None enlarged or abnormal density. Vascular: Negative. Limited intracranial: Negative. Visualized orbits: Negative. Mastoids and visualized paranasal sinuses: Clear. Skeleton: No acute or aggressive process. Mild multilevel discogenic degenerative changes of the cervical spine. No significant bony foraminal or canal stenosis. Upper chest: Negative. Other: Scar tissue within the left posterior neck soft tissues from lipoma removal, no recurrent lipoma identified. IMPRESSION: 1. 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 2. Scarring at site of left posterior neck lipoma resection, no recurrent lipoma identified. Electronically Signed   By: Kristine Garbe M.D.   On: 04/17/2018 22:26     Labs:  CBC: Recent Labs    11/05/17 1203 04/21/18 0847  WBC 6.6 5.0  HGB 13.2 12.7  HCT 39.9 38.2  PLT 73.0* 75.0*    COAGS: No results for input(s): INR, APTT in the last 8760 hours.  BMP: Recent Labs    11/05/17 1203 01/20/18 1522 04/15/18 1401 04/21/18 0847  NA 136 138 144 140  K 4.0 4.0 5.2* 4.7  CL 103 104 108 105  CO2 27 27 28 28   GLUCOSE 101* 97 97 83  BUN 25* 22 22 29*  CALCIUM 9.6 9.2 9.4 9.8  CREATININE 0.83 0.89 0.91 0.94    LIVER FUNCTION TESTS: Recent Labs    11/05/17 1203 01/20/18 1522 04/15/18 1401 04/21/18 0847  BILITOT 0.4 0.3 0.2 0.4  AST 22 18 19 19   ALT 14 13 15 15   ALKPHOS 108 97 114 103  PROT 8.1 7.6 7.6 7.8  ALBUMIN 4.3 4.0 4.1 4.2    TUMOR MARKERS: No results for input(s): AFPTM, CEA, CA199, CHROMGRNA in the last 8760 hours.  Assessment and Plan: Left parotid nodule/mass For US guided biopsy Labs pending Risks and benefits discussed with the patient including, but not limited to bleeding, infection, damage to adjacent structures or low yield requiring additional tests.  All of the patient's questions were answered, patient is agreeable to proceed. Consent signed and in chart.    Thank you for this interesting consult.  I greatly enjoyed meeting Laura Bender and look forward to participating in their care.  A copy of this report was sent to the requesting provider on this date.  Electronically Signed: Ascencion Dike, PA-C 05/14/2018, 11:29 AM   I spent a total of 20 minutes in face to face in clinical consultation, greater than 50% of which was counseling/coordinating care for parotid biopsy

## 2018-05-14 NOTE — Progress Notes (Signed)
Dr. Laurence Ferrari, did not visualize anything to biopsy, no procedure done.

## 2018-05-22 ENCOUNTER — Encounter: Payer: Self-pay | Admitting: Family Medicine

## 2018-05-22 ENCOUNTER — Ambulatory Visit (INDEPENDENT_AMBULATORY_CARE_PROVIDER_SITE_OTHER): Payer: BLUE CROSS/BLUE SHIELD | Admitting: Family Medicine

## 2018-05-22 ENCOUNTER — Ambulatory Visit (HOSPITAL_BASED_OUTPATIENT_CLINIC_OR_DEPARTMENT_OTHER)
Admission: RE | Admit: 2018-05-22 | Discharge: 2018-05-22 | Disposition: A | Discharge status: Home / Self Care | Payer: BLUE CROSS/BLUE SHIELD | Source: Ambulatory Visit | Attending: Family Medicine | Admitting: Family Medicine

## 2018-05-22 VITALS — BP 108/66 | HR 67 | Temp 98.2°F | Resp 18 | Wt 191.6 lb

## 2018-05-22 DIAGNOSIS — D110 Benign neoplasm of parotid gland: Secondary | ICD-10-CM

## 2018-05-22 DIAGNOSIS — E782 Mixed hyperlipidemia: Secondary | ICD-10-CM

## 2018-05-22 DIAGNOSIS — M8580 Other specified disorders of bone density and structure, unspecified site: Secondary | ICD-10-CM

## 2018-05-22 DIAGNOSIS — D693 Immune thrombocytopenic purpura: Secondary | ICD-10-CM

## 2018-05-22 DIAGNOSIS — K219 Gastro-esophageal reflux disease without esophagitis: Secondary | ICD-10-CM

## 2018-05-22 DIAGNOSIS — M8588 Other specified disorders of bone density and structure, other site: Secondary | ICD-10-CM | POA: Diagnosis not present

## 2018-05-22 DIAGNOSIS — E2839 Other primary ovarian failure: Secondary | ICD-10-CM | POA: Diagnosis not present

## 2018-05-22 DIAGNOSIS — Z780 Asymptomatic menopausal state: Secondary | ICD-10-CM | POA: Diagnosis not present

## 2018-05-22 DIAGNOSIS — K047 Periapical abscess without sinus: Secondary | ICD-10-CM

## 2018-05-22 LAB — COMPREHENSIVE METABOLIC PANEL
ALBUMIN: 4.4 g/dL (ref 3.5–5.2)
ALK PHOS: 115 U/L (ref 39–117)
ALT: 15 U/L (ref 0–35)
AST: 20 U/L (ref 0–37)
BUN: 25 mg/dL — ABNORMAL HIGH (ref 6–23)
CO2: 28 mEq/L (ref 19–32)
Calcium: 9.8 mg/dL (ref 8.4–10.5)
Chloride: 103 mEq/L (ref 96–112)
Creatinine, Ser: 0.9 mg/dL (ref 0.40–1.20)
GFR: 67.56 mL/min (ref >60.00)
Glucose, Bld: 86 mg/dL (ref 70–99)
POTASSIUM: 4.7 meq/L (ref 3.5–5.1)
Sodium: 138 mEq/L (ref 135–145)
TOTAL PROTEIN: 7.9 g/dL (ref 6.0–8.3)
Total Bilirubin: 0.4 mg/dL (ref 0.2–1.2)

## 2018-05-22 LAB — CBC WITH DIFFERENTIAL/PLATELET
BASOS PCT: 1.9 % (ref 0.0–3.0)
Basophils Absolute: 0.1 10*3/uL (ref 0.0–0.1)
EOS PCT: 2 % (ref 0.0–5.0)
Eosinophils Absolute: 0.1 10*3/uL (ref 0.0–0.7)
HEMATOCRIT: 39.1 % (ref 36.0–46.0)
HEMOGLOBIN: 13.1 g/dL (ref 12.0–15.0)
Lymphocytes Relative: 37 % (ref 12.0–46.0)
Lymphs Abs: 2 10*3/uL (ref 0.7–4.0)
MCHC: 33.4 g/dL (ref 30.0–36.0)
MCV: 93.6 fl (ref 78.0–100.0)
MONOS PCT: 14.4 % — AB (ref 3.0–12.0)
Monocytes Absolute: 0.8 10*3/uL (ref 0.1–1.0)
Neutro Abs: 2.4 10*3/uL (ref 1.4–7.7)
Neutrophils Relative %: 44.7 % (ref 43.0–77.0)
Platelets: 60 10*3/uL — ABNORMAL LOW (ref 150.0–400.0)
RBC: 4.17 Mil/uL (ref 3.87–5.11)
RDW: 13.3 % (ref 11.5–15.5)
WBC: 5.4 10*3/uL (ref 4.0–10.5)

## 2018-05-22 LAB — SEDIMENTATION RATE: Sed Rate: 17 mm/hr (ref 0–30)

## 2018-05-22 LAB — TSH: TSH: 1.48 u[IU]/mL (ref 0.35–4.50)

## 2018-05-22 LAB — LIPID PANEL
CHOLESTEROL: 208 mg/dL — AB (ref 0–200)
HDL: 64.2 mg/dL (ref >39.00)
LDL Cholesterol: 120 mg/dL — ABNORMAL HIGH (ref 0–99)
NonHDL: 143.82
Total CHOL/HDL Ratio: 3
Triglycerides: 119 mg/dL (ref 0.0–149.0)
VLDL: 23.8 mg/dL (ref 0.0–40.0)

## 2018-05-22 MED ORDER — CLINDAMYCIN HCL 300 MG PO CAPS: 300.0000 mg | ORAL_CAPSULE | Freq: Three times a day (TID) | ORAL | 0 refills | Status: DC

## 2018-05-22 MED FILL — CLINDAMYCIN HCL 300 MG CAP: 300 | 7 days supply | Qty: 21 | Fill #0

## 2018-05-22 NOTE — Patient Instructions (Addendum)
Can try Zantac or Pepcid for quick  Heartburn relief if you are able to come off of Omeprazole   Dental Abscess A dental abscess is pus in or around a tooth. Follow these instructions at home:  Take medicines only as told by your dentist.  If you were prescribed antibiotic medicine, finish all of it even if you start to feel better.  Rinse your mouth (gargle) often with salt water.  Do not drive or use heavy machinery, like a lawn mower, while taking pain medicine.  Do not apply heat to the outside of your mouth.  Keep all follow-up visits as told by your dentist. This is important. Contact a doctor if:  Your pain is worse, and medicine does not help. Get help right away if:  You have a fever or chills.  Your symptoms suddenly get worse.  You have a very bad headache.  You have problems breathing or swallowing.  You have trouble opening your mouth.  You have puffiness (swelling) in your neck or around your eye. This information is not intended to replace advice given to you by your health care provider. Make sure you discuss any questions you have with your health care provider. Document Released: 04/05/2015 Document Revised: 04/26/2016 Document Reviewed: 11/16/2014 Elsevier Interactive Patient Education  [employer].

## 2018-05-22 NOTE — Progress Notes (Signed)
Subjective:  I acted as a Education administrator for Dr. Charlett Blake. [place], [place]  [name], female    DOB: 1956/06/05, 62 y.o.   MRN: [id number]  No chief complaint on file.   HPI  Patient is in today for 4 month follow up and she is doing some better.  Is following with ENT Dr Benjamine Mola and was set up with radiology for an FNA and after the radiologist reviewed the imaging the decision was made not to biopsy but to proceed with watchful waiting for now and they will repeat imaging in December of 2019 unless she develops further symptoms. She just had a dental abscess treated on the same side recently and her pain is decreasing. She still endorses, fatigue, nausea and hot sweats. Denies CP/palp/SOB/HA/congestion/fevers/ or GU c/o. Taking meds as prescribed Patient Care Team: [name], MD as PCP - General (Family Medicine) Ronald Lobo, MD as Consulting Physician (Gastroenterology) Clent Jacks, MD as Consulting Physician (Ophthalmology)   Past Medical History:  Diagnosis Date  . Advanced care planning/counseling discussion 09/01/2014  . Allergic state 04/17/2013  . Anxiety   . 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 Clotting disorder ([place])   . Depression 03-04-12   tx. meds  . Depression with anxiety 04/17/2013  . ETOH abuse   . Fibromyalgia 03-04-12   Sporadic pain  . GERD (gastroesophageal reflux disease) 08/17/2013  . History of transfusion of platelets X 1   "related to ITP"  . Hyperlipidemia, mixed 10/16/2015  . ITP (idiopathic thrombocytopenic purpura) 03-04-12   Dx.  '95- blow normal levels, but stable.  . Lipoma 03-04-12   Rt. back about scapular ares  . Migraine    "that's what the dr thinks I had yesterday; still unsure; never had one before" (06/19/2016)  . Overweight 03/06/2015  . PONV (postoperative nausea and vomiting)   . Preventative health care 04/17/2013; 08/17/2013  . Solitary pulmonary nodule 08/17/2013   LLL seen on CT in March 2014    Past Surgical  History:  Procedure Laterality Date  . APPENDECTOMY  1995  . BREAST BIOPSY Left 2012   needle biopsy(benign)-Titanium needle marker remains  . CHOLECYSTECTOMY N/A 04/24/2013   Procedure: LAPAROSCOPIC CHOLECYSTECTOMY WITH INTRAOPERATIVE CHOLANGIOGRAM;  Surgeon: Haywood Lasso, MD;  Location: [facility];  Service: General;  Laterality: N/A;  . COLONOSCOPY  2008   WNL, Dr Cristina Gong  . IRRIGATION AND DEBRIDEMENT ABSCESS  ~ 2005 X 2   buttocks  . LIPOMA EXCISION  ~ 04/2016   "base of my neck"  . MASS EXCISION  03/10/2012   Procedure: EXCISION MASS;  Surgeon: Odis Hollingshead, MD;  Location: [facility];  Service: General;  Laterality: N/A;  Removal of back lipoma  . 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 SPLENECTOMY, TOTAL  1995  . TOTAL ABDOMINAL HYSTERECTOMY  1995   total, endometriosis, fibroid, ovarian atrophy    Family History  Problem Relation Age of Onset  . Heart disease Maternal Grandmother   . Heart disease Maternal Grandfather   . Heart attack Maternal Grandfather 62  . Heart disease Paternal Grandmother   . Heart attack Paternal Grandmother 86  . Heart disease Paternal Grandfather   . Cancer Paternal Grandfather        chest- smoker  . Diabetes Mother        type 2- controlled by diet  . Atrial fibrillation Father   . Hyperlipidemia Sister   . Migraines Sister   .  Cancer Sister        pancreatic  . Depression Sister   . COPD Sister     Social History   Socioeconomic History  . Marital status: Single    Spouse name: Not on file  . Number of children: 0  . Years of education: 64  . Highest education level: Not on file  Occupational History  . Not on file  Social Needs  . Financial resource strain: Not on file  . Food insecurity:    Worry: Not on file    Inability: Not on file  . Transportation needs:    Medical: Not on file    Non-medical: Not on file  Tobacco Use  . Smoking status: Former Smoker    Packs/day: 0.75    Years: 5.00    Pack  years: 3.75    Types: Cigarettes    Last attempt to quit: 02/21/1983    Years since quitting: 35.2  . Smokeless tobacco: Never Used  Substance and Sexual Activity  . Alcohol use: Yes    Alcohol/week: 7.2 oz    Types: 12 Cans of beer per week  . Drug use: No  . 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 Physical activity:    Days per week: Not on file    Minutes per session: Not on file  . Stress: Not on file  Relationships  . Social connections:    Talks on phone: Not on file    Gets together: Not on file    Attends religious service: Not on file    Active member of club or organization: Not on file    Attends meetings of clubs or organizations: Not on file    Relationship status: Not on file  . Intimate partner violence:    Fear of current or ex partner: Not on file    Emotionally abused: Not on file    Physically abused: Not on file    Forced sexual activity: Not on file  Other Topics Concern  . Not on file  Social History Narrative   Lives at home w/ her roommate   Right-handed   Caffeine: has cut down to 1-2 cups 1/2 caf coffee in the a.m.    Outpatient Medications Prior to Visit  Medication Sig Dispense Refill  . acetaminophen (TYLENOL) 500 MG tablet Take 1,000 mg by mouth every 6 (six) hours as needed for pain.    . Calcium Carb-Cholecalciferol (CALCIUM 600 + D PO) Take 1 tablet by mouth daily.    . Cholecalciferol (VITAMIN D) 2000 units CAPS Take 2,000 Units by mouth daily.    . clorazepate (TRANXENE) 7.5 MG tablet Take 1 tablet (7.5 mg total) by mouth at bedtime. 90 tablet 0  . docusate sodium (COLACE) 100 MG capsule Take 100 mg by mouth daily.    Marland Kitchen FLUoxetine (PROZAC) 20 MG capsule TAKE 2 CAPSULES (40 MG TOTAL) BY MOUTH DAILY. 180 capsule 1  . hydrocortisone (ANUSOL-HC) 25 MG suppository Place 1 suppository (25 mg total) rectally 2 (two) times daily. 12  suppository 2  . loratadine (CLARITIN) 10 MG tablet Take 1 tablet (10 mg total) by mouth daily. 30 tablet 11  . omeprazole (PRILOSEC) 20 MG capsule TAKE ONE CAPSULE BY MOUTH DAILY 90 capsule 1  . topiramate (TOPAMAX) 25 MG tablet TAKE 1 TABLET (25 MG TOTAL) BY MOUTH DAILY. 90 tablet 1  .  traZODone (DESYREL) 50 MG tablet TAKE 1 & 1/2 TABLETS (75 MG TOTAL) BY MOUTH AT BEDTIME. 135 tablet 1  . ALPRAZolam (XANAX) 0.25 MG tablet TAKE 1 TABLET BY MOUTH TWICE DAILY AS NEEDED FOR SLEEP OR ANXIETY 20 tablet 2  . clorazepate (TRANXENE) 7.5 MG tablet TAKE 1 TABLET BY MOUTH AT BEDTIME 90 tablet 0  . famciclovir (FAMVIR) 500 MG tablet Take 1 tablet (500 mg total) by mouth 3 (three) times daily. 21 tablet 0  . fluticasone (FLONASE) 50 MCG/ACT nasal spray Place 2 sprays into both nostrils daily. 16 g 6  . fluticasone (FLONASE) 50 MCG/ACT nasal spray Place 2 sprays into both nostrils daily. 16 g 6  . meclizine (ANTIVERT) 25 MG tablet Take 1 tablet (25 mg total) by mouth 3 (three) times daily as needed for dizziness. 30 tablet 0  . Multiple Vitamins-Minerals (HAIR/SKIN/NAILS PO) Take 3 tablets by mouth daily.    Marland Kitchen ofloxacin (FLOXIN OTIC) 0.3 % OTIC solution Place 10 drops into the left ear daily. 5 mL 0  . ranitidine (ZANTAC) 150 MG tablet Take 150 mg by mouth at bedtime.     No facility-administered medications prior to visit.     Allergies  Allergen Reactions  . Codeine Nausea And Vomiting    Review of Systems  Constitutional: Positive for malaise/fatigue.  HENT: Negative for congestion.   Eyes: Negative for blurred vision.  Respiratory: Negative for shortness of breath.   Cardiovascular: Negative for chest pain, palpitations and leg swelling.  Gastrointestinal: Positive for nausea. Negative for abdominal pain and blood in stool.  Genitourinary: Negative for dysuria and frequency.  Musculoskeletal: Negative for falls.  Skin: Negative for rash.  Neurological: Negative for dizziness, loss of  consciousness and headaches.  Endo/Heme/Allergies: Negative for environmental allergies.  Psychiatric/Behavioral: Negative for depression. The patient is not nervous/anxious.        Objective:    Physical Exam  Constitutional: She is oriented to person, place, and time. She appears well-developed and well-nourished. No distress.  HENT:  Head: Normocephalic and atraumatic.  Nose: Nose normal.  Eyes: Right eye exhibits no discharge. Left eye exhibits no discharge.  Neck: Normal range of motion. Neck supple.  Cardiovascular: Normal rate, regular rhythm and normal heart sounds.  No murmur heard. Pulmonary/Chest: Effort normal and breath sounds normal.  Abdominal: Soft. Bowel sounds are normal. There is no tenderness.  Musculoskeletal: She exhibits no edema.  Neurological: She is alert and oriented to person, place, and time.  Skin: Skin is warm and dry.  Psychiatric: She has a normal mood and affect.  Nursing note and vitals reviewed.   BP 108/66 (BP Location: Left Arm, Patient Position: Sitting, Cuff Size: Normal)   Pulse 67   Temp 98.2 F (36.8 C) (Oral)   Resp 18   Wt 191 lb 9.6 oz (86.9 kg)   SpO2 93%   BMI 29.13 kg/m  Wt Readings from Last 3 Encounters:  05/22/18 191 lb 9.6 oz (86.9 kg)  04/21/18 192 lb 3.2 oz (87.2 kg)  04/15/18 194 lb 12.8 oz (88.4 kg)   BP Readings from Last 3 Encounters:  05/22/18 108/66  05/14/18 (!) 123/59  04/21/18 110/68     Immunization History  Administered Date(s) Administered  . Influenza Split 09/02/2012  . Influenza,inj,Quad PF,6+ Mos 08/17/2013, 08/31/2014, 07/31/2016, 09/03/2017  . Influenza-Unspecified 09/01/2015  . Pneumococcal Conjugate-13 12/03/1993, 12/04/2003, 10/19/2013  . Tdap 04/15/2013    Health Maintenance  Topic Date Due  . PNEUMOCOCCAL POLYSACCHARIDE VACCINE (1) 12/24/1958  .  FOOT EXAM  12/24/1966  . OPHTHALMOLOGY EXAM  12/24/1966  . URINE MICROALBUMIN  12/24/1966  . PAP SMEAR  12/03/1996  . HEMOGLOBIN A1C   12/20/2016  . INFLUENZA VACCINE  07/03/2018  . MAMMOGRAM  01/26/2020  . TETANUS/TDAP  04/16/2023  . COLONOSCOPY  02/16/2026  . Hepatitis C Screening  Completed  . HIV Screening  Completed    Lab Results  Component Value Date   WBC 5.4 05/22/2018   HGB 13.1 05/22/2018   HCT 39.1 05/22/2018   PLT 60.0 (L) 05/22/2018   GLUCOSE 86 05/22/2018   CHOL 208 (H) 05/22/2018   TRIG 119.0 05/22/2018   HDL 64.20 05/22/2018   LDLDIRECT 110.0 11/05/2017   LDLCALC 120 (H) 05/22/2018   ALT 15 05/22/2018   AST 20 05/22/2018   NA 138 05/22/2018   K 4.7 05/22/2018   CL 103 05/22/2018   CREATININE 0.90 05/22/2018   BUN 25 (H) 05/22/2018   CO2 28 05/22/2018   TSH 1.48 05/22/2018   INR 0.89 05/14/2018   HGBA1C 4.8 06/19/2016    Lab Results  Component Value Date   TSH 1.48 05/22/2018   Lab Results  Component Value Date   WBC 5.4 05/22/2018   HGB 13.1 05/22/2018   HCT 39.1 05/22/2018   MCV 93.6 05/22/2018   PLT 60.0 (L) 05/22/2018   Lab Results  Component Value Date   NA 138 05/22/2018   K 4.7 05/22/2018   CO2 28 05/22/2018   GLUCOSE 86 05/22/2018   BUN 25 (H) 05/22/2018   CREATININE 0.90 05/22/2018   BILITOT 0.4 05/22/2018   ALKPHOS 115 05/22/2018   AST 20 05/22/2018   ALT 15 05/22/2018   PROT 7.9 05/22/2018   ALBUMIN 4.4 05/22/2018   CALCIUM 9.8 05/22/2018   ANIONGAP 7 06/19/2016   GFR 67.56 05/22/2018   Lab Results  Component Value Date   CHOL 208 (H) 05/22/2018   Lab Results  Component Value Date   HDL 64.20 05/22/2018   Lab Results  Component Value Date   LDLCALC 120 (H) 05/22/2018   Lab Results  Component Value Date   TRIG 119.0 05/22/2018   Lab Results  Component Value Date   CHOLHDL 3 05/22/2018   Lab Results  Component Value Date   HGBA1C 4.8 06/19/2016         Assessment & Plan:   Problem List Items Addressed This Visit    Idiopathic thrombocytopenic purpura ([place]) - Primary (Chronic)    Continues to be asymptomatic, had worsened some with  blood work elsewhere but improved with todays check again.       GERD (gastroesophageal reflux disease)    Avoid offending foods. Do not eat large meals in late evening and consider raising head of bed.       Relevant Orders   Comprehensive metabolic panel (Completed)   TSH (Completed)   Hyperlipidemia, mixed    Encouraged heart healthy diet, increase exercise, avoid trans fats, consider a krill oil cap daily      Relevant Orders   Lipid panel (Completed)   Benign neoplasm of parotid gland    Is following with ENT Dr Benjamine Mola and was set up with radiology for an FNA and after the radiologist reviewed the imaging the decision was made not to biopsy but to proceed with watchful waiting for now and they will repeat imaging in December of 2019 unless she develops further symptoms.       Dental abscess    Has just recently  been treated and is beginning to feel some better but she still endorses some increased fatigue and sweats. Will treat with abx and reassess      Relevant Orders   CBC with Differential/Platelet (Completed)   Sedimentation rate (Completed)      I have discontinued [facility]ge Gainer "Pam"'s ranitidine, Multiple Vitamins-Minerals (HAIR/SKIN/NAILS PO), ALPRAZolam, meclizine, fluticasone, famciclovir, ofloxacin, and fluticasone. I am also having her start on clindamycin. Additionally, I am having her maintain her acetaminophen, Calcium Carb-Cholecalciferol (CALCIUM 600 + D PO), docusate sodium, Vitamin D, traZODone, clorazepate, topiramate, FLUoxetine, hydrocortisone, omeprazole, and loratadine.  Meds ordered this encounter  Medications  . clindamycin (CLEOCIN) 300 MG capsule    Sig: Take 1 capsule (300 mg total) by mouth 3 (three) times daily.    Dispense:  21 capsule    Refill:  0    CMA served as scribe during this visit. History, Physical and Plan performed by medical provider. Documentation and orders reviewed and attested to.  Penni Homans, MD

## 2018-05-22 NOTE — Assessment & Plan Note (Signed)
Encouraged heart healthy diet, increase exercise, avoid trans fats, consider a krill oil cap daily 

## 2018-05-24 DIAGNOSIS — K047 Periapical abscess without sinus: Secondary | ICD-10-CM | POA: Insufficient documentation

## 2018-05-24 NOTE — Assessment & Plan Note (Signed)
Avoid offending foods. Do not eat large meals in late evening and consider raising head of bed.  

## 2018-05-24 NOTE — Assessment & Plan Note (Signed)
Has just recently been treated and is beginning to feel some better but she still endorses some increased fatigue and sweats. Will treat with abx and reassess

## 2018-05-24 NOTE — Assessment & Plan Note (Signed)
Is following with ENT Dr Benjamine Mola and was set up with radiology for an FNA and after the radiologist reviewed the imaging the decision was made not to biopsy but to proceed with watchful waiting for now and they will repeat imaging in December of 2019 unless she develops further symptoms.

## 2018-05-24 NOTE — Assessment & Plan Note (Signed)
Continues to be asymptomatic, had worsened some with blood work elsewhere but improved with todays check again.

## 2018-06-12 ENCOUNTER — Other Ambulatory Visit: Payer: Self-pay | Admitting: Family Medicine

## 2018-06-12 MED FILL — traZODone HCL 50 MG TABS: 50 | 90 days supply | Qty: 135 | Fill #1

## 2018-06-12 MED FILL — OMEPRAZOLE 20 MG CPDR: 20 | 90 days supply | Qty: 90 | Fill #1

## 2018-06-17 MED FILL — CLORAZEPATE 7.5 MG TABLET: 7.5 | 90 days supply | Qty: 90 | Fill #0

## 2018-06-17 MED FILL — TOPIRAMATE 25 MG TAB: 25 | 90 days supply | Qty: 90 | Fill #0

## 2018-08-12 ENCOUNTER — Other Ambulatory Visit: Payer: Self-pay | Admitting: Family Medicine

## 2018-08-13 MED FILL — FLUoxetine HCL 20 MG CAPS: 20 | 90 days supply | Qty: 180 | Fill #0

## 2018-09-15 ENCOUNTER — Other Ambulatory Visit: Payer: Self-pay | Admitting: Medical

## 2018-09-15 MED FILL — TOPIRAMATE 25 MG TAB: 25 | 90 days supply | Qty: 90 | Fill #1

## 2018-09-16 MED FILL — traZODone HCL 50 MG TABS: 50 | 90 days supply | Qty: 135 | Fill #0

## 2018-09-29 ENCOUNTER — Other Ambulatory Visit: Payer: BLUE CROSS/BLUE SHIELD

## 2018-09-30 ENCOUNTER — Ambulatory Visit (INDEPENDENT_AMBULATORY_CARE_PROVIDER_SITE_OTHER): Payer: BLUE CROSS/BLUE SHIELD | Admitting: Family Medicine

## 2018-09-30 DIAGNOSIS — F1010 Alcohol abuse, uncomplicated: Secondary | ICD-10-CM

## 2018-09-30 DIAGNOSIS — K219 Gastro-esophageal reflux disease without esophagitis: Secondary | ICD-10-CM

## 2018-09-30 DIAGNOSIS — H6992 Unspecified Eustachian tube disorder, left ear: Secondary | ICD-10-CM

## 2018-09-30 DIAGNOSIS — G43709 Chronic migraine without aura, not intractable, without status migrainosus: Secondary | ICD-10-CM

## 2018-09-30 DIAGNOSIS — R42 Dizziness and giddiness: Secondary | ICD-10-CM | POA: Diagnosis not present

## 2018-09-30 DIAGNOSIS — IMO0002 Reserved for concepts with insufficient information to code with codable children: Secondary | ICD-10-CM

## 2018-09-30 DIAGNOSIS — H6982 Other specified disorders of Eustachian tube, left ear: Secondary | ICD-10-CM

## 2018-09-30 DIAGNOSIS — T7840XA Allergy, unspecified, initial encounter: Secondary | ICD-10-CM | POA: Diagnosis not present

## 2018-09-30 DIAGNOSIS — E782 Mixed hyperlipidemia: Secondary | ICD-10-CM

## 2018-09-30 DIAGNOSIS — D693 Immune thrombocytopenic purpura: Secondary | ICD-10-CM

## 2018-09-30 NOTE — Assessment & Plan Note (Signed)
Avoid offending foods, start probiotics. Do not eat large meals in late evening and consider raising head of bed.  

## 2018-09-30 NOTE — Assessment & Plan Note (Signed)
Uses Loratadine prn encouraged twice daily for now

## 2018-09-30 NOTE — Assessment & Plan Note (Signed)
Continue to monitor and asymptomatic today

## 2018-09-30 NOTE — Patient Instructions (Signed)
Shingrix is the new shingles shot 2 shots over 2-6 months, call insurance regarding coverage Vertigo Vertigo is the feeling that you or your surroundings are moving when they are not. Vertigo can be dangerous if it occurs while you are doing something that could endanger you or others, such as driving. What are the causes? This condition is caused by a disturbance in the signals that are sent by your body's sensory systems to your brain. Different causes of a disturbance can lead to vertigo, including:  Infections, especially in the inner ear.  A bad reaction to a drug, or misuse of alcohol and medicines.  Withdrawal from drugs or alcohol.  Quickly changing positions, as when lying down or rolling over in bed.  Migraine headaches.  Decreased blood flow to the brain.  Decreased blood pressure.  Increased pressure in the brain from a head or neck injury, stroke, infection, tumor, or bleeding.  Central nervous system disorders.  What are the signs or symptoms? Symptoms of this condition usually occur when you move your head or your eyes in different directions. Symptoms may start suddenly, and they usually last for less than a minute. Symptoms may include:  Loss of balance and falling.  Feeling like you are spinning or moving.  Feeling like your surroundings are spinning or moving.  Nausea and vomiting.  Blurred vision or double vision.  Difficulty hearing.  Slurred speech.  Dizziness.  Involuntary eye movement (nystagmus).  Symptoms can be mild and cause only slight annoyance, or they can be severe and interfere with daily life. Episodes of vertigo may return (recur) over time, and they are often triggered by certain movements. Symptoms may improve over time. How is this diagnosed? This condition may be diagnosed based on medical history and the quality of your nystagmus. Your health care provider may test your eye movements by asking you to quickly change positions to  trigger the nystagmus. This may be called the Dix-Hallpike test, head thrust test, or roll test. You may be referred to a health care provider who specializes in ear, nose, and throat (ENT) problems (otolaryngologist) or a provider who specializes in disorders of the central nervous system (neurologist). You may have additional testing, including:  A physical exam.  Blood tests.  MRI.  A CT scan.  An electrocardiogram (ECG). This records electrical activity in your heart.  An electroencephalogram (EEG). This records electrical activity in your brain.  Hearing tests.  How is this treated? Treatment for this condition depends on the cause and the severity of the symptoms. Treatment options include:  Medicines to treat nausea or vertigo. These are usually used for severe cases. Some medicines that are used to treat other conditions may also reduce or eliminate vertigo symptoms. These include: ? Medicines that control allergies (antihistamines). ? Medicines that control seizures (anticonvulsants). ? Medicines that relieve depression (antidepressants). ? Medicines that relieve anxiety (sedatives).  Head movements to adjust your inner ear back to normal. If your vertigo is caused by an ear problem, your health care provider may recommend certain movements to correct the problem.  Surgery. This is rare.  Follow these instructions at home: Safety  Move slowly.Avoid sudden body or head movements.  Avoid driving.  Avoid operating heavy machinery.  Avoid doing any tasks that would cause danger to you or others if you would have a vertigo episode during the task.  If you have trouble walking or keeping your balance, try using a cane for stability. If you feel dizzy or  unstable, sit down right away.  Return to your normal activities as told by your health care provider. Ask your health care provider what activities are safe for you. General instructions  Take over-the-counter and  prescription medicines only as told by your health care provider.  Avoid certain positions or movements as told by your health care provider.  Drink enough fluid to keep your urine clear or pale yellow.  Keep all follow-up visits as told by your health care provider. This is important. Contact a health care provider if:  Your medicines do not relieve your vertigo or they make it worse.  You have a fever.  Your condition gets worse or you develop new symptoms.  Your family or friends notice any behavioral changes.  Your nausea or vomiting gets worse.  You have numbness or a "pins and needles" sensation in part of your body. Get help right away if:  You have difficulty moving or speaking.  You are always dizzy.  You faint.  You develop severe headaches.  You have weakness in your hands, arms, or legs.  You have changes in your hearing or vision.  You develop a stiff neck.  You develop sensitivity to light. This information is not intended to replace advice given to you by your health care provider. Make sure you discuss any questions you have with your health care provider. Document Released: 08/29/2005 Document Revised: 05/02/2016 Document Reviewed: 03/14/2015 Elsevier Interactive Patient Education  [employer].

## 2018-09-30 NOTE — Assessment & Plan Note (Signed)
Intermittent roughly 3 x a week and responsive to epley maneuvers and hydrate and it improves. No changes presently notify us if worsens.

## 2018-09-30 NOTE — Assessment & Plan Note (Signed)
Encouraged cessation.

## 2018-09-30 NOTE — Assessment & Plan Note (Signed)
Has had a flare with left ear pain with allergies encouraged Loratadine daily

## 2018-09-30 NOTE — Progress Notes (Signed)
Subjective:    [name], female    DOB: 05/17/1956, 62 y.o.   MRN: [id number]  No chief complaint on file.   HPI Patient is in today for follow-nonproductive cough at times but does not keep her up at night.  Notes some intermittent ear discomfort as well up.  She is doing mostly well although she does admit to still having vertigo episodes couple of times a week.  She notes they are short-lived and have no associated symptoms.  Epley maneuver does help.  She continues to struggle with a mild ear discomfort as well.  No recent febrile illness or acute hospitalizations. Denies CP/palp/SOB/HA/congestion/fevers/GI or GU c/o. Taking meds as prescribed  Past Medical History:  Diagnosis Date  . Advanced care planning/counseling discussion 09/01/2014  . Allergic state 04/17/2013  . Anxiety   . 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 Clotting disorder ([place])   . Depression 03-04-12   tx. meds  . Depression with anxiety 04/17/2013  . ETOH abuse   . Fibromyalgia 03-04-12   Sporadic pain  . GERD (gastroesophageal reflux disease) 08/17/2013  . History of transfusion of platelets X 1   "related to ITP"  . Hyperlipidemia, mixed 10/16/2015  . ITP (idiopathic thrombocytopenic purpura) 03-04-12   Dx.  '95- blow normal levels, but stable.  . Lipoma 03-04-12   Rt. back about scapular ares  . Migraine    "that's what the dr thinks I had yesterday; still unsure; never had one before" (06/19/2016)  . Overweight 03/06/2015  . PONV (postoperative nausea and vomiting)   . Preventative health care 04/17/2013; 08/17/2013  . Solitary pulmonary nodule 08/17/2013   LLL seen on CT in March 2014    Past Surgical History:  Procedure Laterality Date  . APPENDECTOMY  1995  . BREAST BIOPSY Left 2012   needle biopsy(benign)-Titanium needle marker remains  . CHOLECYSTECTOMY N/A 04/24/2013   Procedure: LAPAROSCOPIC CHOLECYSTECTOMY WITH INTRAOPERATIVE CHOLANGIOGRAM;  Surgeon: Haywood Lasso, MD;   Location: [facility];  Service: General;  Laterality: N/A;  . COLONOSCOPY  2008   WNL, Dr Cristina Gong  . IRRIGATION AND DEBRIDEMENT ABSCESS  ~ 2005 X 2   buttocks  . LIPOMA EXCISION  ~ 04/2016   "base of my neck"  . MASS EXCISION  03/10/2012   Procedure: EXCISION MASS;  Surgeon: Odis Hollingshead, MD;  Location: [facility];  Service: General;  Laterality: N/A;  Removal of back lipoma  . 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 SPLENECTOMY, TOTAL  1995  . TOTAL ABDOMINAL HYSTERECTOMY  1995   total, endometriosis, fibroid, ovarian atrophy    Family History  Problem Relation Age of Onset  . Heart disease Maternal Grandmother   . Heart disease Maternal Grandfather   . Heart attack Maternal Grandfather 62  . Heart disease Paternal Grandmother   . Heart attack Paternal Grandmother 86  . Heart disease Paternal Grandfather   . Cancer Paternal Grandfather        chest- smoker  . Diabetes Mother        type 2- controlled by diet  . Atrial fibrillation Father   . Hyperlipidemia Sister   . Migraines Sister   . Cancer Sister        pancreatic  . Depression Sister   . COPD Sister     Social History   Socioeconomic History  . Marital status: Single    Spouse name: Not on file  . Number  of children: 0  . Years of education: 58  . Highest education level: Not on file  Occupational History  . Not on file  Social Needs  . Financial resource strain: Not on file  . Food insecurity:    Worry: Not on file    Inability: Not on file  . Transportation needs:    Medical: Not on file    Non-medical: Not on file  Tobacco Use  . Smoking status: Former Smoker    Packs/day: 0.75    Years: 5.00    Pack years: 3.75    Types: Cigarettes    Last attempt to quit: 02/21/1983    Years since quitting: 35.6  . Smokeless tobacco: Never Used  Substance and Sexual Activity  . Alcohol use: Yes    Alcohol/week: 12.0 standard drinks    Types: 12 Cans of beer per week  . Drug use: No  . 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 Physical activity:    Days per week: Not on file    Minutes per session: Not on file  . Stress: Not on file  Relationships  . Social connections:    Talks on phone: Not on file    Gets together: Not on file    Attends religious service: Not on file    Active member of club or organization: Not on file    Attends meetings of clubs or organizations: Not on file    Relationship status: Not on file  . Intimate partner violence:    Fear of current or ex partner: Not on file    Emotionally abused: Not on file    Physically abused: Not on file    Forced sexual activity: Not on file  Other Topics Concern  . Not on file  Social History Narrative   Lives at home w/ her roommate   Right-handed   Caffeine: has cut down to 1-2 cups 1/2 caf coffee in the a.m.    Outpatient Medications Prior to Visit  Medication Sig Dispense Refill  . acetaminophen (TYLENOL) 500 MG tablet Take 1,000 mg by mouth every 6 (six) hours as needed for pain.    . Calcium Carb-Cholecalciferol (CALCIUM 600 + D PO) Take 1 tablet by mouth daily.    . Cholecalciferol (VITAMIN D) 2000 units CAPS Take 2,000 Units by mouth daily.    . clorazepate (TRANXENE) 7.5 MG tablet TAKE ONE TABLET BY MOUTH EVERY NIGHT AT BEDTIME. 90 tablet 0  . docusate sodium (COLACE) 100 MG capsule Take 100 mg by mouth daily.    Marland Kitchen FLUoxetine (PROZAC) 20 MG capsule TAKE 2 CAPSULES (40 MG TOTAL) BY MOUTH DAILY. 180 capsule 1  . hydrocortisone (ANUSOL-HC) 25 MG suppository Place 1 suppository (25 mg total) rectally 2 (two) times daily. 12 suppository 2  . loratadine (CLARITIN) 10 MG tablet Take 1 tablet (10 mg total) by mouth daily. 30 tablet 11  . omeprazole (PRILOSEC) 20 MG capsule TAKE ONE CAPSULE BY MOUTH DAILY 90 capsule 1  . topiramate (TOPAMAX) 25 MG tablet TAKE 1 TABLET (25 MG TOTAL) BY MOUTH DAILY. 90 tablet  1  . traZODone (DESYREL) 50 MG tablet TAKE 1 & 1/2 TABLETS (75 MG TOTAL) BY MOUTH AT BEDTIME. 135 tablet 1  . clindamycin (CLEOCIN) 300 MG capsule Take 1 capsule (300 mg total) by mouth 3 (three) times daily. 21 capsule 0   No  facility-administered medications prior to visit.     Allergies  Allergen Reactions  . Codeine Nausea And Vomiting    Review of Systems  Constitutional: Negative for fever and malaise/fatigue.  HENT: Negative for congestion.   Eyes: Negative for blurred vision.  Respiratory: Positive for cough. Negative for sputum production and shortness of breath.   Cardiovascular: Negative for chest pain, palpitations and leg swelling.  Gastrointestinal: Negative for abdominal pain, blood in stool and nausea.  Genitourinary: Negative for dysuria and frequency.  Musculoskeletal: Negative for falls.  Skin: Negative for rash.  Neurological: Positive for dizziness. Negative for sensory change, focal weakness and loss of consciousness.  Endo/Heme/Allergies: Negative for environmental allergies.  Psychiatric/Behavioral: Negative for depression. The patient is not nervous/anxious.        Objective:    Physical Exam  Constitutional: She is oriented to person, place, and time. She appears well-developed and well-nourished. No distress.  HENT:  Head: Normocephalic and atraumatic.  Nose: Nose normal.  Eyes: Right eye exhibits no discharge. Left eye exhibits no discharge.  Neck: Normal range of motion. Neck supple.  Cardiovascular: Normal rate and regular rhythm.  No murmur heard. Pulmonary/Chest: Effort normal and breath sounds normal.  Abdominal: Soft. Bowel sounds are normal. There is no tenderness.  Musculoskeletal: She exhibits no edema.  Neurological: She is alert and oriented to person, place, and time.  Skin: Skin is warm and dry.  Psychiatric: She has a normal mood and affect.  Nursing note and vitals reviewed.   BP 90/62 (BP Location: Left Arm, Patient Position:  Sitting, Cuff Size: Normal)   Pulse 71   Temp 97.6 F (36.4 C) (Oral)   Resp 18   Wt 192 lb 3.2 oz (87.2 kg)   SpO2 98%   BMI 29.22 kg/m  Wt Readings from Last 3 Encounters:  09/30/18 192 lb 3.2 oz (87.2 kg)  05/22/18 191 lb 9.6 oz (86.9 kg)  04/21/18 192 lb 3.2 oz (87.2 kg)     Lab Results  Component Value Date   WBC 5.4 05/22/2018   HGB 13.1 05/22/2018   HCT 39.1 05/22/2018   PLT 60.0 (L) 05/22/2018   GLUCOSE 86 05/22/2018   CHOL 208 (H) 05/22/2018   TRIG 119.0 05/22/2018   HDL 64.20 05/22/2018   LDLDIRECT 110.0 11/05/2017   LDLCALC 120 (H) 05/22/2018   ALT 15 05/22/2018   AST 20 05/22/2018   NA 138 05/22/2018   K 4.7 05/22/2018   CL 103 05/22/2018   CREATININE 0.90 05/22/2018   BUN 25 (H) 05/22/2018   CO2 28 05/22/2018   TSH 1.48 05/22/2018   INR 0.89 05/14/2018   HGBA1C 4.8 06/19/2016    Lab Results  Component Value Date   TSH 1.48 05/22/2018   Lab Results  Component Value Date   WBC 5.4 05/22/2018   HGB 13.1 05/22/2018   HCT 39.1 05/22/2018   MCV 93.6 05/22/2018   PLT 60.0 (L) 05/22/2018   Lab Results  Component Value Date   NA 138 05/22/2018   K 4.7 05/22/2018   CO2 28 05/22/2018   GLUCOSE 86 05/22/2018   BUN 25 (H) 05/22/2018   CREATININE 0.90 05/22/2018   BILITOT 0.4 05/22/2018   ALKPHOS 115 05/22/2018   AST 20 05/22/2018   ALT 15 05/22/2018   PROT 7.9 05/22/2018   ALBUMIN 4.4 05/22/2018   CALCIUM 9.8 05/22/2018   ANIONGAP 7 06/19/2016   GFR 67.56 05/22/2018   Lab Results  Component Value Date   CHOL 208 (  H) 05/22/2018   Lab Results  Component Value Date   HDL 64.20 05/22/2018   Lab Results  Component Value Date   LDLCALC 120 (H) 05/22/2018   Lab Results  Component Value Date   TRIG 119.0 05/22/2018   Lab Results  Component Value Date   CHOLHDL 3 05/22/2018   Lab Results  Component Value Date   HGBA1C 4.8 06/19/2016       Assessment & Plan:   Problem List Items Addressed This Visit    Idiopathic  thrombocytopenic purpura ([place]) (Chronic)    Continue to monitor and asymptomatic today      Relevant Orders   CBC with Differential/Platelet   Allergy    Uses Loratadine prn encouraged twice daily for now      GERD (gastroesophageal reflux disease)    Avoid offending foods, start probiotics. Do not eat large meals in late evening and consider raising head of bed.       Relevant Orders   Comprehensive metabolic panel   TSH   Vertigo    Intermittent roughly 3 x a week and responsive to epley maneuvers and hydrate and it improves. No changes presently notify us if worsens.      Hyperlipidemia, mixed    Encouraged heart healthy diet, increase exercise, avoid trans fats, consider a krill oil cap daily      Relevant Orders   Lipid panel   ETOH abuse    Encouraged cessation      Eustachian tube dysfunction    Has had a flare with left ear pain with allergies encouraged Loratadine daily      Chronic migraine    Encouraged increased hydration, 64 ounces of clear fluids daily. Minimize alcohol and caffeine. Eat small frequent meals with lean proteins and complex carbs. Avoid high and low blood sugars. Get adequate sleep, 7-8 hours a night. Needs exercise daily preferably in the morning.         I have discontinued [facility] "Pam"'s clindamycin. I am also having her maintain her acetaminophen, Calcium Carb-Cholecalciferol (CALCIUM 600 + D PO), docusate sodium, Vitamin D, hydrocortisone, omeprazole, loratadine, clorazepate, topiramate, FLUoxetine, and traZODone.  No orders of the defined types were placed in this encounter.    Penni Homans, MD

## 2018-09-30 NOTE — Assessment & Plan Note (Signed)
Encouraged heart healthy diet, increase exercise, avoid trans fats, consider a krill oil cap daily 

## 2018-09-30 NOTE — Progress Notes (Signed)
[  p7

## 2018-09-30 NOTE — Assessment & Plan Note (Signed)
Encouraged increased hydration, 64 ounces of clear fluids daily. Minimize alcohol and caffeine. Eat small frequent meals with lean proteins and complex carbs. Avoid high and low blood sugars. Get adequate sleep, 7-8 hours a night. Needs exercise daily preferably in the morning.  

## 2018-11-17 ENCOUNTER — Ambulatory Visit (HOSPITAL_BASED_OUTPATIENT_CLINIC_OR_DEPARTMENT_OTHER)
Admission: RE | Admit: 2018-11-17 | Discharge: 2018-11-17 | Disposition: A | Discharge status: Home / Self Care | Payer: BLUE CROSS/BLUE SHIELD | Source: Ambulatory Visit | Attending: Medical | Admitting: Medical

## 2018-11-17 ENCOUNTER — Ambulatory Visit (INDEPENDENT_AMBULATORY_CARE_PROVIDER_SITE_OTHER): Payer: BLUE CROSS/BLUE SHIELD | Admitting: Medical

## 2018-11-17 ENCOUNTER — Encounter: Payer: Self-pay | Admitting: Medical

## 2018-11-17 VITALS — BP 124/53 | HR 78 | Temp 98.6°F | Resp 16 | Ht 68.0 in | Wt 192.8 lb

## 2018-11-17 DIAGNOSIS — J40 Bronchitis, not specified as acute or chronic: Secondary | ICD-10-CM | POA: Diagnosis not present

## 2018-11-17 DIAGNOSIS — R059 Cough, unspecified: Secondary | ICD-10-CM

## 2018-11-17 DIAGNOSIS — J0100 Acute maxillary sinusitis, unspecified: Secondary | ICD-10-CM

## 2018-11-17 DIAGNOSIS — R05 Cough: Secondary | ICD-10-CM

## 2018-11-17 MED ORDER — FLUTICASONE PROPIONATE 50 MCG/ACT NA SUSP: 2.0000 | Freq: Every day | NASAL | 1 refills | Status: DC

## 2018-11-17 MED ORDER — BENZONATATE 100 MG PO CAPS: 100.0000 mg | ORAL_CAPSULE | Freq: Three times a day (TID) | ORAL | 0 refills | Status: DC | PRN

## 2018-11-17 MED ORDER — DOXYCYCLINE HYCLATE 100 MG PO TABS: ORAL_TABLET | ORAL | 0 refills | Status: DC

## 2018-11-17 MED FILL — BENZONATATE 100 MG CAP: 100 | 10 days supply | Qty: 30 | Fill #0

## 2018-11-17 MED FILL — FLUoxetine HCL 20 MG CAPS: 20 | 90 days supply | Qty: 180 | Fill #1

## 2018-11-17 MED FILL — FLUTICASONE PROP 50 MCG SPR: 50 | 30 days supply | Qty: 16 | Fill #0

## 2018-11-17 MED FILL — DOXYCYCLINE HYCLATE 100 MG: 100 | 10 days supply | Qty: 20 | Fill #0

## 2018-11-17 NOTE — Progress Notes (Signed)
Subjective:    [name], female    DOB: [birth year], 62 y.o.   MRN: [id number]  HPI  Pt in for about one week of illness. She states started in throat then followed by chest congestion and sinus pressure. Coughing up colored mucus and now sinus pain/teeth.   She shows me thick yellowish/brown mucus.  Some sweating at night. Pt states feels like has no energy. No obvious wheeze.     Review of Systems  Constitutional: Negative for chills, fatigue and fever.  HENT: Positive for congestion, postnasal drip, sinus pressure and sinus pain. Negative for sore throat.   Respiratory: Positive for cough. Negative for chest tightness, shortness of breath and stridor.   Cardiovascular: Negative for chest pain and palpitations.  Gastrointestinal: Negative for abdominal pain.  Neurological: Negative for dizziness, seizures, syncope and weakness.  Hematological: Negative for adenopathy. Does not bruise/bleed easily.  Psychiatric/Behavioral: Negative for behavioral problems and confusion.    Past Medical History:  Diagnosis Date  . Advanced care planning/counseling discussion 09/01/2014  . Allergic state 04/17/2013  . Anxiety   . 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 Clotting disorder ([place])   . Depression 03-04-12   tx. meds  . Depression with anxiety 04/17/2013  . ETOH abuse   . Fibromyalgia 03-04-12   Sporadic pain  . GERD (gastroesophageal reflux disease) 08/17/2013  . History of transfusion of platelets X 1   "related to ITP"  . Hyperlipidemia, mixed 10/16/2015  . ITP (idiopathic thrombocytopenic purpura) 03-04-12   Dx.  '95- blow normal levels, but stable.  . Lipoma 03-04-12   Rt. back about scapular ares  . Migraine    "that's what the dr thinks I had yesterday; still unsure; never had one before" (06/19/2016)  . Overweight 03/06/2015  . PONV (postoperative nausea and vomiting)   . Preventative health care 04/17/2013; 08/17/2013  . Solitary pulmonary nodule  08/17/2013   LLL seen on CT in March 2014     Social History   Socioeconomic History  . Marital status: Single    Spouse name: Not on file  . Number of children: 0  . Years of education: 9  . Highest education level: Not on file  Occupational History  . Not on file  Social Needs  . Financial resource strain: Not on file  . Food insecurity:    Worry: Not on file    Inability: Not on file  . Transportation needs:    Medical: Not on file    Non-medical: Not on file  Tobacco Use  . Smoking status: Former Smoker    Packs/day: 0.75    Years: 5.00    Pack years: 3.75    Types: Cigarettes    Last attempt to quit: 02/21/1983    Years since quitting: 35.7  . Smokeless tobacco: Never Used  Substance and Sexual Activity  . Alcohol use: Yes    Alcohol/week: 12.0 standard drinks    Types: 12 Cans of beer per week  . Drug use: No  . 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 Physical activity:    Days per week: Not on file    Minutes per session: Not on file  . Stress: Not on file  Relationships  . Social connections:    Talks on phone: Not on file    Gets together: Not on file  Attends religious service: Not on file    Active member of club or organization: Not on file    Attends meetings of clubs or organizations: Not on file    Relationship status: Not on file  . Intimate partner violence:    Fear of current or ex partner: Not on file    Emotionally abused: Not on file    Physically abused: Not on file    Forced sexual activity: Not on file  Other Topics Concern  . Not on file  Social History Narrative   Lives at home w/ her roommate   Right-handed   Caffeine: has cut down to 1-2 cups 1/2 caf coffee in the a.m.    Past Surgical History:  Procedure Laterality Date  . APPENDECTOMY  1995  . BREAST BIOPSY Left 2012   needle biopsy(benign)-Titanium needle marker  remains  . CHOLECYSTECTOMY N/A 04/24/2013   Procedure: LAPAROSCOPIC CHOLECYSTECTOMY WITH INTRAOPERATIVE CHOLANGIOGRAM;  Surgeon: Haywood Lasso, MD;  Location: [facility];  Service: General;  Laterality: N/A;  . COLONOSCOPY  2008   WNL, Dr Cristina Gong  . IRRIGATION AND DEBRIDEMENT ABSCESS  ~ 2005 X 2   buttocks  . LIPOMA EXCISION  ~ 04/2016   "base of my neck"  . MASS EXCISION  03/10/2012   Procedure: EXCISION MASS;  Surgeon: Odis Hollingshead, MD;  Location: [facility];  Service: General;  Laterality: N/A;  Removal of back lipoma  . 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 SPLENECTOMY, TOTAL  1995  . TOTAL ABDOMINAL HYSTERECTOMY  1995   total, endometriosis, fibroid, ovarian atrophy    Family History  Problem Relation Age of Onset  . Heart disease Maternal Grandmother   . Heart disease Maternal Grandfather   . Heart attack Maternal Grandfather 62  . Heart disease Paternal Grandmother   . Heart attack Paternal Grandmother 86  . Heart disease Paternal Grandfather   . Cancer Paternal Grandfather        chest- smoker  . Diabetes Mother        type 2- controlled by diet  . Atrial fibrillation Father   . Hyperlipidemia Sister   . Migraines Sister   . Cancer Sister        pancreatic  . Depression Sister   . COPD Sister     Allergies  Allergen Reactions  . Codeine Nausea And Vomiting    Current Outpatient Medications on File Prior to Visit  Medication Sig Dispense Refill  . acetaminophen (TYLENOL) 500 MG tablet Take 1,000 mg by mouth every 6 (six) hours as needed for pain.    . Calcium Carb-Cholecalciferol (CALCIUM 600 + D PO) Take 1 tablet by mouth daily.    . Cholecalciferol (VITAMIN D) 2000 units CAPS Take 2,000 Units by mouth daily.    . clorazepate (TRANXENE) 7.5 MG tablet TAKE ONE TABLET BY MOUTH EVERY NIGHT AT BEDTIME. 90 tablet 0  . docusate sodium (COLACE) 100 MG capsule Take 100 mg by mouth daily.    Marland Kitchen FLUoxetine (PROZAC) 20 MG capsule TAKE 2 CAPSULES (40 MG  TOTAL) BY MOUTH DAILY. 180 capsule 1  . hydrocortisone (ANUSOL-HC) 25 MG suppository Place 1 suppository (25 mg total) rectally 2 (two) times daily. 12 suppository 2  . loratadine (CLARITIN) 10 MG tablet Take 1 tablet (10 mg total) by mouth daily. 30 tablet 11  . omeprazole (PRILOSEC) 20 MG capsule TAKE ONE CAPSULE BY MOUTH DAILY 90 capsule 1  . topiramate (TOPAMAX) 25 MG  tablet TAKE 1 TABLET (25 MG TOTAL) BY MOUTH DAILY. 90 tablet 1  . traZODone (DESYREL) 50 MG tablet TAKE 1 & 1/2 TABLETS (75 MG TOTAL) BY MOUTH AT BEDTIME. 135 tablet 1   No current facility-administered medications on file prior to visit.     BP (!) 124/53   Pulse 78   Temp 98.6 F (37 C) (Oral)   Resp 16   Ht 5\' 8"  (1.727 m)   Wt 192 lb 12.8 oz (87.5 kg)   SpO2 100%   BMI 29.32 kg/m       Objective:   Physical Exam  General  Mental Status - Alert. General Appearance - Well groomed. Not in acute distress.  Skin Rashes- No Rashes.  HEENT Head- Normal. Ear Auditory Canal - Left- Normal. Right - Normal.Tympanic Membrane- Left- Normal. Right- Normal. Eye Sclera/Conjunctiva- Left- Normal. Right- Normal. Nose & Sinuses Nasal Mucosa- Left-  Boggy and Congested. Right-  Boggy and  Congested.Bilateral maxillary and frontal sinus pressure. Mouth & Throat Lips: Upper Lip- Normal: no dryness, cracking, pallor, cyanosis, or vesicular eruption. Lower Lip-Normal: no dryness, cracking, pallor, cyanosis or vesicular eruption. Buccal Mucosa- Bilateral- No Aphthous ulcers. Oropharynx- No Discharge or Erythema. Tonsils: Characteristics- Bilateral- No Erythema or Congestion. Size/Enlargement- Bilateral- No enlargement. Discharge- bilateral-None.  Neck Neck- Supple. No Masses.   Chest and Lung Exam Auscultation: Breath Sounds:- even and unlabored.faint rough breath sounds at base.  Cardiovascular Auscultation:Rythm- Regular, rate and rhythm. Murmurs & Other Heart Sounds:Ausculatation of the heart reveal- No  Murmurs.  Lymphatic Head & Neck General Head & Neck Lymphatics: Bilateral: Description- No Localized lymphadenopathy.       Assessment & Plan:   You appear to have bronchitis and sinusitis. Rest hydrate and tylenol for fever. I am prescribing cough medicine benzonatate, and doxycycline antibiotic. For your nasal congestion rx flonase.  Do want to get chest xray to see if you have pneumonia present.  Follow up in 7-10 days or as needed  [employer], [employer]

## 2018-11-17 NOTE — Patient Instructions (Signed)
You appear to have bronchitis and sinusitis. Rest hydrate and tylenol for fever. I am prescribing cough medicine benzonatate, and doxycycline antibiotic. For your nasal congestion rx flonase.  Do want to get chest xray to see if you have pneumonia present.  Follow up in 7-10 days or as needed

## 2018-12-02 ENCOUNTER — Encounter: Payer: Self-pay | Admitting: Family Medicine

## 2018-12-02 ENCOUNTER — Encounter: Payer: Self-pay | Admitting: Medical

## 2018-12-02 ENCOUNTER — Other Ambulatory Visit: Payer: Self-pay | Admitting: Medical

## 2018-12-04 ENCOUNTER — Ambulatory Visit (INDEPENDENT_AMBULATORY_CARE_PROVIDER_SITE_OTHER): Payer: BLUE CROSS/BLUE SHIELD | Admitting: Medical

## 2018-12-04 ENCOUNTER — Encounter: Payer: Self-pay | Admitting: Medical

## 2018-12-04 VITALS — BP 117/46 | HR 73 | Temp 97.7°F | Resp 16 | Ht 68.0 in | Wt 187.8 lb

## 2018-12-04 DIAGNOSIS — R062 Wheezing: Secondary | ICD-10-CM

## 2018-12-04 DIAGNOSIS — R05 Cough: Secondary | ICD-10-CM | POA: Diagnosis not present

## 2018-12-04 DIAGNOSIS — J0100 Acute maxillary sinusitis, unspecified: Secondary | ICD-10-CM | POA: Diagnosis not present

## 2018-12-04 DIAGNOSIS — R059 Cough, unspecified: Secondary | ICD-10-CM

## 2018-12-04 DIAGNOSIS — J40 Bronchitis, not specified as acute or chronic: Secondary | ICD-10-CM | POA: Diagnosis not present

## 2018-12-04 MED ORDER — DOXYCYCLINE HYCLATE 100 MG PO TABS: ORAL_TABLET | ORAL | 0 refills | Status: DC

## 2018-12-04 MED ORDER — FLUTICASONE PROPIONATE HFA 110 MCG/ACT IN AERO: 2.0000 | INHALATION_SPRAY | Freq: Two times a day (BID) | RESPIRATORY_TRACT | 12 refills | Status: DC

## 2018-12-04 MED ORDER — ALBUTEROL SULFATE HFA 108 (90 BASE) MCG/ACT IN AERS: 2.0000 | INHALATION_SPRAY | Freq: Four times a day (QID) | RESPIRATORY_TRACT | 2 refills | Status: DC | PRN

## 2018-12-04 MED ORDER — BENZONATATE 100 MG PO CAPS: 100.0000 mg | ORAL_CAPSULE | Freq: Three times a day (TID) | ORAL | 0 refills | Status: DC | PRN

## 2018-12-04 MED ORDER — LEVOFLOXACIN 500 MG PO TABS: 500.0000 mg | ORAL_TABLET | Freq: Every day | ORAL | 0 refills | Status: DC

## 2018-12-04 MED FILL — VENTOLIN HFA 90 MCG INHALER: 108 (90 BAS | 25 days supply | Qty: 18 | Fill #0

## 2018-12-04 MED FILL — BENZONATATE 100 MG CAP: 100 | 10 days supply | Qty: 30 | Fill #0

## 2018-12-04 MED FILL — levoFLOXacin 500 MG TABS: 500 | 10 days supply | Qty: 10 | Fill #0

## 2018-12-04 MED FILL — DOXYCYCLINE HYCLATE 100 MG: 100 | 10 days supply | Qty: 20 | Fill #0

## 2018-12-04 NOTE — Patient Instructions (Signed)
You do have some persisting bronchitis and sinus infection type symptoms.  Your chest x-ray was normal.  Unfortunately your symptoms persist despite using her doxycycline.  Decided to give you stronger antibiotic called levofloxacin.  Please make sure that you are eating probiotic rich foods or get some probiotics over the counter while using antibiotic.  For cough, you can use 2 tablets of benzonatate every 8 hours.  You report some minimal intermittent wheezing and sometimes cough can be associated with reactive airways.  Going to prescribe the Flovent inhaler to use 2 puffs twice daily.  Use albuterol as explained as well as needed for constant wheezing.  Consider giving you taper prednisone but you report some side effects in the past.  If we need to give prednisone in the future would likely give a short 4-day taper dosw.  Decide not to get chest x-ray today.  But if symptoms persist despite use of levofloxacin then would get a chest x-ray later this week or early next week.  Follow-up in 7 to 10 days or as needed.

## 2018-12-04 NOTE — Progress Notes (Signed)
Subjective:    Patient ID: NATOYA VISCOMI, female    DOB: Mar 28, 1956, 63 y.o.   MRN: [id number]  HPI  Pt states her cough is persisting. She states that left lower ribs hurt know due to cough. She is bringing up brown mucous when she coughs. Pt states some sweats at night. Some chills. Pt cxr did not show pneumonia. She has been on doxy, flonase and benzonatate. But symptoms still persists.  Pt out of antibiotic since christmas day.  Pt states she never got over illness fully.   She has heard herself wheeze sometimes.     Review of Systems  Constitutional: Negative for chills, fatigue and fever.  HENT: Negative for congestion, nosebleeds, postnasal drip, sinus pressure and sinus pain.   Respiratory: Positive for cough and wheezing. Negative for chest tightness and shortness of breath.        Occasional.  Cardiovascular: Negative for chest pain and palpitations.  Gastrointestinal: Negative for abdominal pain.  Musculoskeletal: Negative for back pain.  Skin: Negative for rash.  Neurological: Negative for dizziness and headaches.  Hematological: Negative for adenopathy. Does not bruise/bleed easily.  Psychiatric/Behavioral: Negative for behavioral problems and confusion.    Past Medical History:  Diagnosis Date  . Advanced care planning/counseling discussion 09/01/2014  . Allergic state 04/17/2013  . Anxiety   . 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 Clotting disorder ([place])   . Depression 03-04-12   tx. meds  . Depression with anxiety 04/17/2013  . ETOH abuse   . Fibromyalgia 03-04-12   Sporadic pain  . GERD (gastroesophageal reflux disease) 08/17/2013  . History of transfusion of platelets X 1   "related to ITP"  . Hyperlipidemia, mixed 10/16/2015  . ITP (idiopathic thrombocytopenic purpura) 03-04-12   Dx.  '95- blow normal levels, but stable.  . Lipoma 03-04-12   Rt. back about scapular ares  . Migraine    "that's what the dr thinks I had yesterday; still unsure;  never had one before" (06/19/2016)  . Overweight 03/06/2015  . PONV (postoperative nausea and vomiting)   . Preventative health care 04/17/2013; 08/17/2013  . Solitary pulmonary nodule 08/17/2013   LLL seen on CT in March 2014     Social History   Socioeconomic History  . Marital status: Single    Spouse name: Not on file  . Number of children: 0  . Years of education: 35  . Highest education level: Not on file  Occupational History  . Not on file  Social Needs  . Financial resource strain: Not on file  . Food insecurity:    Worry: Not on file    Inability: Not on file  . Transportation needs:    Medical: Not on file    Non-medical: Not on file  Tobacco Use  . Smoking status: Former Smoker    Packs/day: 0.75    Years: 5.00    Pack years: 3.75    Types: Cigarettes    Last attempt to quit: 02/21/1983    Years since quitting: 35.8  . Smokeless tobacco: Never Used  Substance and Sexual Activity  . Alcohol use: Yes    Alcohol/week: 12.0 standard drinks    Types: 12 Cans of beer per week  . Drug use: No  . 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 Physical activity:    Days per week: Not on file  Minutes per session: Not on file  . Stress: Not on file  Relationships  . Social connections:    Talks on phone: Not on file    Gets together: Not on file    Attends religious service: Not on file    Active member of club or organization: Not on file    Attends meetings of clubs or organizations: Not on file    Relationship status: Not on file  . Intimate partner violence:    Fear of current or ex partner: Not on file    Emotionally abused: Not on file    Physically abused: Not on file    Forced sexual activity: Not on file  Other Topics Concern  . Not on file  Social History Narrative   Lives at home w/ her roommate   Right-handed   Caffeine: has cut down to 1-2 cups  1/2 caf coffee in the a.m.    Past Surgical History:  Procedure Laterality Date  . APPENDECTOMY  1995  . BREAST BIOPSY Left 2012   needle biopsy(benign)-Titanium needle marker remains  . CHOLECYSTECTOMY N/A 04/24/2013   Procedure: LAPAROSCOPIC CHOLECYSTECTOMY WITH INTRAOPERATIVE CHOLANGIOGRAM;  Surgeon: Haywood Lasso, MD;  Location: [facility];  Service: General;  Laterality: N/A;  . COLONOSCOPY  2008   WNL, Dr Cristina Gong  . IRRIGATION AND DEBRIDEMENT ABSCESS  ~ 2005 X 2   buttocks  . LIPOMA EXCISION  ~ 04/2016   "base of my neck"  . MASS EXCISION  03/10/2012   Procedure: EXCISION MASS;  Surgeon: Odis Hollingshead, MD;  Location: [facility];  Service: General;  Laterality: N/A;  Removal of back lipoma  . 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 SPLENECTOMY, TOTAL  1995  . TOTAL ABDOMINAL HYSTERECTOMY  1995   total, endometriosis, fibroid, ovarian atrophy    Family History  Problem Relation Age of Onset  . Heart disease Maternal Grandmother   . Heart disease Maternal Grandfather   . Heart attack Maternal Grandfather 62  . Heart disease Paternal Grandmother   . Heart attack Paternal Grandmother 86  . Heart disease Paternal Grandfather   . Cancer Paternal Grandfather        chest- smoker  . Diabetes Mother        type 2- controlled by diet  . Atrial fibrillation Father   . Hyperlipidemia Sister   . Migraines Sister   . Cancer Sister        pancreatic  . Depression Sister   . COPD Sister     Allergies  Allergen Reactions  . Codeine Nausea And Vomiting    Current Outpatient Medications on File Prior to Visit  Medication Sig Dispense Refill  . acetaminophen (TYLENOL) 500 MG tablet Take 1,000 mg by mouth every 6 (six) hours as needed for pain.    . benzonatate (TESSALON) 100 MG capsule Take 1 capsule (100 mg total) by mouth 3 (three) times daily as needed for cough. 30 capsule 0  . Calcium Carb-Cholecalciferol (CALCIUM 600 + D PO) Take 1 tablet by mouth daily.    .  Cholecalciferol (VITAMIN D) 2000 units CAPS Take 2,000 Units by mouth daily.    . clorazepate (TRANXENE) 7.5 MG tablet TAKE ONE TABLET BY MOUTH EVERY NIGHT AT BEDTIME. 90 tablet 0  . docusate sodium (COLACE) 100 MG capsule Take 100 mg by mouth daily.    Marland Kitchen doxycycline (VIBRA-TABS) 100 MG tablet 1 tab po bid x 10 days 20 tablet 0  .  FLUoxetine (PROZAC) 20 MG capsule TAKE 2 CAPSULES (40 MG TOTAL) BY MOUTH DAILY. 180 capsule 1  . fluticasone (FLONASE) 50 MCG/ACT nasal spray Place 2 sprays into both nostrils daily. 16 g 1  . hydrocortisone (ANUSOL-HC) 25 MG suppository Place 1 suppository (25 mg total) rectally 2 (two) times daily. 12 suppository 2  . loratadine (CLARITIN) 10 MG tablet Take 1 tablet (10 mg total) by mouth daily. 30 tablet 11  . omeprazole (PRILOSEC) 20 MG capsule TAKE ONE CAPSULE BY MOUTH DAILY 90 capsule 1  . topiramate (TOPAMAX) 25 MG tablet TAKE 1 TABLET (25 MG TOTAL) BY MOUTH DAILY. 90 tablet 1  . traZODone (DESYREL) 50 MG tablet TAKE 1 & 1/2 TABLETS (75 MG TOTAL) BY MOUTH AT BEDTIME. 135 tablet 1   No current facility-administered medications on file prior to visit.     BP (!) 117/46   Pulse 73   Temp 97.7 F (36.5 C) (Oral)   Resp 16   Ht 5\' 8"  (1.727 m)   Wt 187 lb 12.8 oz (85.2 kg)   SpO2 100%   BMI 28.55 kg/m       Objective:   Physical Exam  General  Mental Status - Alert. General Appearance - Well groomed. Not in acute distress.  Skin Rashes- No Rashes.  HEENT Head- Normal. Ear Auditory Canal - Left- Normal. Right - Normal.Tympanic Membrane- Left- Normal. Right- Normal. Eye Sclera/Conjunctiva- Left- Normal. Right- Normal. Nose & Sinuses Nasal Mucosa- Left-  Boggy and Congested. Right-  Boggy and  Congested.Bilateral  Faint maxillary and faint frontal sinus pressure. Mouth & Throat Lips: Upper Lip- Normal: no dryness, cracking, pallor, cyanosis, or vesicular eruption. Lower Lip-Normal: no dryness, cracking, pallor, cyanosis or vesicular  eruption. Buccal Mucosa- Bilateral- No Aphthous ulcers. Oropharynx- No Discharge or Erythema. Tonsils: Characteristics- Bilateral- No Erythema or Congestion. Size/Enlargement- Bilateral- No enlargement. Discharge- bilateral-None.  Neck Neck- Supple. No Masses.   Chest and Lung Exam Auscultation: Breath Sounds:-Clear even and unlabored.  Cardiovascular Auscultation:Rythm- Regular, rate and rhythm. Murmurs & Other Heart Sounds:Ausculatation of the heart reveal- No Murmurs.  Lymphatic Head & Neck General Head & Neck Lymphatics: Bilateral: Description- No Localized lymphadenopathy.       Assessment & Plan:  You do have some persisting bronchitis and sinus infection type symptoms.  Your chest x-ray was normal.  Unfortunately your symptoms persist despite using her doxycycline.  Decided to give you stronger antibiotic called levofloxacin.  Please make sure that you are eating probiotic rich foods or get some probiotics over the counter while using antibiotic.  For cough, you can use 2 tablets of benzonatate every 8 hours.  You report some minimal intermittent wheezing and sometimes cough can be associated with reactive airways.  Going to prescribe the Flovent inhaler to use 2 puffs twice daily.  Use albuterol as explained as well as needed for constant wheezing.  Consider giving you taper prednisone but you report some side effects in the past.  If we need to give prednisone in the future would likely give a short 4-day taper dosw.  Decide not to get chest x-ray today.  But if symptoms persist despite use of levofloxacin then would get a chest x-ray later this week or early next week.  Follow-up in 7 to 10 days or as needed.  Mackie Pai, PA-C

## 2018-12-05 ENCOUNTER — Other Ambulatory Visit: Payer: Self-pay

## 2018-12-05 ENCOUNTER — Encounter (HOSPITAL_BASED_OUTPATIENT_CLINIC_OR_DEPARTMENT_OTHER): Payer: Self-pay | Admitting: *Deleted

## 2018-12-05 ENCOUNTER — Emergency Department (HOSPITAL_BASED_OUTPATIENT_CLINIC_OR_DEPARTMENT_OTHER): Payer: BLUE CROSS/BLUE SHIELD

## 2018-12-05 ENCOUNTER — Emergency Department (HOSPITAL_BASED_OUTPATIENT_CLINIC_OR_DEPARTMENT_OTHER)
Admission: EM | Admit: 2018-12-05 | Discharge: 2018-12-05 | Disposition: A | Discharge status: Home / Self Care | Payer: BLUE CROSS/BLUE SHIELD | Attending: Emergency Medicine | Admitting: Emergency Medicine

## 2018-12-05 DIAGNOSIS — Y929 Unspecified place or not applicable: Secondary | ICD-10-CM | POA: Diagnosis not present

## 2018-12-05 DIAGNOSIS — X58XXXA Exposure to other specified factors, initial encounter: Secondary | ICD-10-CM | POA: Insufficient documentation

## 2018-12-05 DIAGNOSIS — Z87891 Personal history of nicotine dependence: Secondary | ICD-10-CM | POA: Diagnosis not present

## 2018-12-05 DIAGNOSIS — S2232XA Fracture of one rib, left side, initial encounter for closed fracture: Secondary | ICD-10-CM

## 2018-12-05 DIAGNOSIS — Y999 Unspecified external cause status: Secondary | ICD-10-CM | POA: Diagnosis not present

## 2018-12-05 DIAGNOSIS — Z8673 Personal history of transient ischemic attack (TIA), and cerebral infarction without residual deficits: Secondary | ICD-10-CM | POA: Insufficient documentation

## 2018-12-05 DIAGNOSIS — Z79899 Other long term (current) drug therapy: Secondary | ICD-10-CM | POA: Insufficient documentation

## 2018-12-05 DIAGNOSIS — R05 Cough: Secondary | ICD-10-CM | POA: Diagnosis not present

## 2018-12-05 DIAGNOSIS — R079 Chest pain, unspecified: Secondary | ICD-10-CM | POA: Diagnosis not present

## 2018-12-05 DIAGNOSIS — Y939 Activity, unspecified: Secondary | ICD-10-CM | POA: Diagnosis not present

## 2018-12-05 DIAGNOSIS — S299XXA Unspecified injury of thorax, initial encounter: Secondary | ICD-10-CM | POA: Diagnosis present

## 2018-12-05 LAB — TROPONIN I: Troponin I: <0.03 ng/mL (ref <0.03)

## 2018-12-05 MED ORDER — METHOCARBAMOL 500 MG PO TABS: 500.0000 mg | ORAL_TABLET | Freq: Two times a day (BID) | ORAL | 0 refills | Status: AC

## 2018-12-05 MED ORDER — FENTANYL CITRATE (PF) 100 MCG/2ML IJ SOLN
25.0000 ug | Freq: Once | INTRAMUSCULAR | Status: AC
  Administered 2018-12-05: 25 ug via INTRAVENOUS
  Filled 2018-12-05: qty 2

## 2018-12-05 MED ORDER — LIDOCAINE 5 % EX PTCH: 1.0000 | MEDICATED_PATCH | CUTANEOUS | 0 refills | Status: AC

## 2018-12-05 MED ORDER — LIDOCAINE 5 % EX PTCH
1.0000 | MEDICATED_PATCH | CUTANEOUS | Status: DC
  Filled 2018-12-05: qty 1

## 2018-12-05 MED FILL — METHOCARBAMOL 500 MG TABLET: 500 | 7 days supply | Qty: 14 | Fill #0

## 2018-12-05 NOTE — ED Notes (Signed)
Patient transported to CT 

## 2018-12-05 NOTE — Discharge Instructions (Addendum)
I have prescribed medication to help with your symptoms, please use the Lidoderm patches as needed. I have also prescribed muscle relaxers to help with your pain, you may take this two times a day. Please follow up with PCP as needed.

## 2018-12-05 NOTE — ED Provider Notes (Signed)
[facility] EMERGENCY DEPARTMENT Provider Note   CSN: [id number] Arrival date & time: 12/05/18  1500     History   Chief Complaint Chief Complaint  Patient presents with  . Rib Injury    HPI Laura Bender is a 63 y.o. female.  63 y.o female with a PMH of anxiety presents to the ED with a  Chief complaint of cough and chest pain x 4 hours.  Reports that she has had this nonproductive cough for the past month but today at lunchtime she had 1 violent cough which made a pain in her left thorax surface.  She describes this pain as a stabbing with a knife worse with movement, cough and worse to touch.  Reports the pain is exacerbated by her standing and taking a deep breath.  Denies attending any medical therapy to relieve her symptoms.  She denies any nausea, vomiting, diaphoretic episode or shortness of breath.     Past Medical History:  Diagnosis Date  . Advanced care planning/counseling discussion 09/01/2014  . Allergic state 04/17/2013  . Anxiety   . 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 Clotting disorder ([place])   . Depression 03-04-12   tx. meds  . Depression with anxiety 04/17/2013  . ETOH abuse   . Fibromyalgia 03-04-12   Sporadic pain  . GERD (gastroesophageal reflux disease) 08/17/2013  . History of transfusion of platelets X 1   "related to ITP"  . Hyperlipidemia, mixed 10/16/2015  . ITP (idiopathic thrombocytopenic purpura) 03-04-12   Dx.  '95- blow normal levels, but stable.  . Lipoma 03-04-12   Rt. back about scapular ares  . Migraine    "that's what the dr thinks I had yesterday; still unsure; never had one before" (06/19/2016)  . Overweight 03/06/2015  . PONV (postoperative nausea and vomiting)   . Preventative health care 04/17/2013; 08/17/2013  . Solitary pulmonary nodule 08/17/2013   LLL seen on CT in March 2014    Patient Active Problem List   Diagnosis Date Noted  . Eustachian tube dysfunction 07/03/2016  . Benign neoplasm of parotid gland  07/03/2016  . Chronic migraine 07/03/2016  . TIA (transient ischemic attack) 06/19/2016  . Stroke-like symptoms 06/19/2016  . ETOH abuse   . Skin nodule 02/19/2016  . Hyperlipidemia, mixed 10/16/2015  . Overweight 03/06/2015  . Advanced care planning/counseling discussion 09/01/2014  . Vertigo 02/11/2014  . GERD (gastroesophageal reflux disease) 08/17/2013  . Solitary pulmonary nodule 08/17/2013  . Preventative health care 08/17/2013  . Allergy 04/17/2013  . Fibromyalgia 04/17/2013  . Depression with anxiety 04/17/2013  . Lipoma of back 02/07/2012  . Idiopathic thrombocytopenic purpura ([place]) 11/23/2011    Past Surgical History:  Procedure Laterality Date  . APPENDECTOMY  1995  . BREAST BIOPSY Left 2012   needle biopsy(benign)-Titanium needle marker remains  . CHOLECYSTECTOMY N/A 04/24/2013   Procedure: LAPAROSCOPIC CHOLECYSTECTOMY WITH INTRAOPERATIVE CHOLANGIOGRAM;  Surgeon: Haywood Lasso, MD;  Location: [facility];  Service: General;  Laterality: N/A;  . COLONOSCOPY  2008   WNL, Dr Cristina Gong  . IRRIGATION AND DEBRIDEMENT ABSCESS  ~ 2005 X 2   buttocks  . LIPOMA EXCISION  ~ 04/2016   "base of my neck"  . MASS EXCISION  03/10/2012   Procedure: EXCISION MASS;  Surgeon: Odis Hollingshead, MD;  Location: [facility];  Service: General;  Laterality: N/A;  Removal of back lipoma  . 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 SPLENECTOMY, TOTAL  [place]   total, endometriosis, fibroid, ovarian atrophy     OB History   No obstetric history on file.      Home Medications    Prior to Admission medications   Medication Sig Start Date End Date Taking? Authorizing Provider  acetaminophen (TYLENOL) 500 MG tablet Take 1,000 mg by mouth every 6 (six) hours as needed for pain.    [provider]  albuterol (PROVENTIL HFA;VENTOLIN HFA) 108 (90 Base) MCG/ACT inhaler Inhale 2 puffs into the lungs every 6 (six) hours as needed for wheezing or  shortness of breath. 12/04/18   Saguier, Percell Miller, PA-C  benzonatate (TESSALON) 100 MG capsule Take 1 capsule (100 mg total) by mouth 3 (three) times daily as needed for cough. 12/04/18   [name], MD  Calcium Carb-Cholecalciferol (CALCIUM 600 + D PO) Take 1 tablet by mouth daily.    [provider]  Cholecalciferol (VITAMIN D) 2000 units CAPS Take 2,000 Units by mouth daily.    [provider]  clorazepate (TRANXENE) 7.5 MG tablet TAKE ONE TABLET BY MOUTH EVERY NIGHT AT BEDTIME. 06/17/18   [name], MD  docusate sodium (COLACE) 100 MG capsule Take 100 mg by mouth daily.    [provider]  doxycycline (VIBRA-TABS) 100 MG tablet 1 tab po bid x 10 days 12/04/18   [name], MD  FLUoxetine (PROZAC) 20 MG capsule TAKE 2 CAPSULES (40 MG TOTAL) BY MOUTH DAILY. 08/12/18   [name], MD  fluticasone (FLONASE) 50 MCG/ACT nasal spray Place 2 sprays into both nostrils daily. 11/17/18   Saguier, Percell Miller, PA-C  fluticasone (FLOVENT HFA) 110 MCG/ACT inhaler Inhale 2 puffs into the lungs 2 (two) times daily. 12/04/18   Saguier, Percell Miller, PA-C  hydrocortisone (ANUSOL-HC) 25 MG suppository Place 1 suppository (25 mg total) rectally 2 (two) times daily. 01/17/18   [name], MD  levofloxacin (LEVAQUIN) 500 MG tablet Take 1 tablet (500 mg total) by mouth daily. 12/04/18   Saguier, Percell Miller, PA-C  lidocaine (LIDODERM) 5 % Place 1 patch onto the skin daily for 10 days. Remove & Discard patch within 12 hours or as directed by MD 12/05/18 12/15/18  Janeece Fitting, PA-C  loratadine (CLARITIN) 10 MG tablet Take 1 tablet (10 mg total) by mouth daily. 04/15/18   Ann Held, DO  methocarbamol (ROBAXIN) 500 MG tablet Take 1 tablet (500 mg total) by mouth 2 (two) times daily for 7 days. 12/05/18 12/12/18  Janeece Fitting, PA-C  omeprazole (PRILOSEC) 20 MG capsule TAKE ONE CAPSULE BY MOUTH DAILY 03/12/18   [name], MD  topiramate (TOPAMAX) 25 MG tablet TAKE 1 TABLET (25 MG TOTAL) BY  MOUTH DAILY. 06/17/18   [name], MD  traZODone (DESYREL) 50 MG tablet TAKE 1 & 1/2 TABLETS (75 MG TOTAL) BY MOUTH AT BEDTIME. 09/16/18   [name], MD    Family History Family History  Problem Relation Age of Onset  . Heart disease Maternal Grandmother   . Heart disease Maternal Grandfather   . Heart attack Maternal Grandfather 62  . Heart disease Paternal Grandmother   . Heart attack Paternal Grandmother 86  . Heart disease Paternal Grandfather   . Cancer Paternal Grandfather        chest- smoker  . Diabetes Mother        type 2- controlled by diet  . Atrial fibrillation Father   . Hyperlipidemia Sister   .  Migraines Sister   . Cancer Sister        pancreatic  . Depression Sister   . COPD Sister     Social History Social History   Tobacco Use  . Smoking status: Former Smoker    Packs/day: 0.75    Years: 5.00    Pack years: 3.75    Types: Cigarettes    Last attempt to quit: 02/21/1983    Years since quitting: 35.8  . Smokeless tobacco: Never Used  Substance Use Topics  . Alcohol use: Yes    Alcohol/week: 12.0 standard drinks    Types: 12 Cans of beer per week  . Drug use: No     Allergies   Codeine   Review of Systems Review of Systems  Constitutional: Negative for fever.  HENT: Negative for sore throat.   Respiratory: Negative for shortness of breath.   Cardiovascular: Positive for chest pain.  Gastrointestinal: Negative for abdominal pain, nausea and vomiting.  All other systems reviewed and are negative.    Physical Exam Updated Vital Signs BP (!) 141/77   Pulse 89   Temp 98.6 F (37 C)   Resp 17   Ht 5\' 8"  (1.727 m)   Wt 85 kg   SpO2 99%   BMI 28.49 kg/m   Physical Exam Vitals signs and nursing note reviewed.  Constitutional:      General: She is not in acute distress.    Appearance: She is well-developed.  HENT:     Head: Normocephalic and atraumatic.     Mouth/Throat:     Pharynx: No oropharyngeal exudate.  Eyes:      Pupils: Pupils are equal, round, and reactive to light.  Neck:     Musculoskeletal: Normal range of motion.  Cardiovascular:     Rate and Rhythm: Regular rhythm.     Heart sounds: Normal heart sounds.  Pulmonary:     Effort: Pulmonary effort is normal. No respiratory distress.     Breath sounds: Decreased breath sounds present. No wheezing.       Comments: Significant tenderness to palpation along the left chest. Pain exacerbate by deep inspiration.  Abdominal:     General: Bowel sounds are normal. There is no distension.     Palpations: Abdomen is soft.     Tenderness: There is no abdominal tenderness.  Musculoskeletal:        General: No tenderness or deformity.     Right lower leg: No edema.     Left lower leg: No edema.  Skin:    General: Skin is warm and dry.  Neurological:     Mental Status: She is alert and oriented to person, place, and time.      ED Treatments / Results  Labs (all labs ordered are listed, but only abnormal results are displayed) Labs Reviewed  TROPONIN I    EKG None  Radiology Dg Ribs Unilateral W/chest Left  Result Date: 12/05/2018 CLINICAL DATA:  Cough with sudden pain. EXAM: LEFT RIBS AND CHEST - 3+ VIEW COMPARISON:  11/17/2018 FINDINGS: Lungs are clear. Negative for a pneumothorax. Surgical clips in the right upper abdomen. Heart size is normal. Marker in the left lower chest. Multiple surgical clips in the left upper abdomen. Small metallic density in the left breast tissue. Negative for a displaced left rib fracture. IMPRESSION: No acute abnormality. Electronically Signed   By: Markus Daft M.D.   On: 12/05/2018 15:29   Ct Chest Wo Contrast  Result Date: 12/05/2018 CLINICAL  DATA:  Coughing and chest pain. EXAM: CT CHEST WITHOUT CONTRAST TECHNIQUE: Multidetector CT imaging of the chest was performed following the standard protocol without IV contrast. COMPARISON:  Radiographs dated 12/05/2018 and chest CT dated 02/20/2013 and abdominal MRI  dated 10/03/2013 FINDINGS: Cardiovascular: Minimal aortic atherosclerosis. Heart size is normal. No pericardial effusion. Mediastinum/Nodes: No enlarged mediastinal or axillary lymph nodes. Thyroid gland, trachea, and esophagus demonstrate no significant findings. Lungs/Pleura: Lungs are clear. No pleural effusion or pneumothorax. Upper Abdomen: There are vague low-density lesions in the right and left lobes of the liver which correlate with the benign hemangiomata demonstrated on the prior abdominal MRI. Musculoskeletal: There is a hairline fracture of the lateral aspect of the left ninth rib only seen on image 155 of series 6. The bones are otherwise essentially normal. IMPRESSION: Hairline fracture of the lateral aspect of the left ninth rib. No other significant abnormalities. 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 Aortic Atherosclerosis (ICD10-I70.0). Can ages of Electronically Signed   By: Lorriane Shire M.D.   On: 12/05/2018 16:59    Procedures Procedures (including critical care time)  Medications Ordered in ED Medications  lidocaine (LIDODERM) 5 % 1 patch (1 patch Transdermal Not Given 12/05/18 1620)  fentaNYL (SUBLIMAZE) injection 25 mcg (25 mcg Intravenous Given 12/05/18 1630)     Initial Impression / Assessment and Plan / ED Course  I have reviewed the triage vital signs and the nursing notes.  Pertinent labs & imaging results that were available during my care of the patient were reviewed by me and considered in my medical decision making (see chart for details).    Patient present with left rib pain which began after coughing this evening while at work, she reports this cough has been going on for a month but the sudden onset of left rib pain happened this evening.  Valuation patient has pain with palpation along the left thorax, reports the pain is worse with deep inspiration.  Troponin ordered which was negative, EKG was also  ordered which showed   DG chest showed no acute abnormality, no fracture or pneumothorax. Due to patient's severe pain CT chest obtained to r/o any abnormalities.   Final Clinical Impressions(s) / ED Diagnoses   Final diagnoses:  Closed fracture of one rib of left side, initial encounter    ED Discharge Orders         Ordered    lidocaine (LIDODERM) 5 %  Every 24 hours     12/05/18 1706    methocarbamol (ROBAXIN) 500 MG tablet  2 times daily     12/05/18 1713           Janeece Fitting, PA-C 12/05/18 1727    Maudie Flakes, MD 12/06/18 [phone]

## 2018-12-05 NOTE — ED Notes (Signed)
Pt started 2nd round of abx yesterday for URI

## 2018-12-05 NOTE — ED Triage Notes (Signed)
Pt c/o coughing with left rib pain

## 2018-12-05 NOTE — ED Notes (Signed)
Iv placed- blew after administering fentanyl .

## 2018-12-10 ENCOUNTER — Encounter: Payer: Self-pay | Admitting: Family Medicine

## 2018-12-19 ENCOUNTER — Other Ambulatory Visit: Payer: Self-pay | Admitting: Family Medicine

## 2018-12-19 MED FILL — TOPIRAMATE 25 MG TAB: 25 | 90 days supply | Qty: 90 | Fill #0

## 2018-12-22 MED FILL — traZODone HCL 50 MG TABS: 50 | 90 days supply | Qty: 135 | Fill #1

## 2018-12-25 NOTE — Telephone Encounter (Signed)
Called patient left message for patient to call the office

## 2019-01-12 ENCOUNTER — Other Ambulatory Visit: Payer: Self-pay | Admitting: Family Medicine

## 2019-01-12 MED FILL — OMEPRAZOLE 20 MG CPDR: 20 | 90 days supply | Qty: 90 | Fill #0

## 2019-01-30 ENCOUNTER — Other Ambulatory Visit: Payer: Self-pay | Admitting: Family Medicine

## 2019-01-30 MED FILL — CLORAZEPATE 7.5 MG TABLET: 7.5 | 90 days supply | Qty: 90 | Fill #0

## 2019-01-30 NOTE — Telephone Encounter (Signed)
Requesting: tranxene Contract:yes UDS:low risk next screen 08/10/18 Last OV:12/04/18 Next OV:04/03/19 Last Refill:06/17/18  #[id number] Database:   Please advise

## 2019-03-02 ENCOUNTER — Other Ambulatory Visit: Payer: Self-pay | Admitting: Family Medicine

## 2019-03-02 MED FILL — FLUoxetine HCL 20 MG CAPS: 20 | 90 days supply | Qty: 180 | Fill #0

## 2019-03-25 MED FILL — TOPIRAMATE 25 MG TAB: 25 | 90 days supply | Qty: 90 | Fill #1

## 2019-04-03 ENCOUNTER — Encounter: Payer: BLUE CROSS/BLUE SHIELD | Admitting: Family Medicine

## 2019-05-11 ENCOUNTER — Other Ambulatory Visit: Payer: Self-pay | Admitting: Family Medicine

## 2019-05-11 MED FILL — OMEPRAZOLE 20 MG CPDR: 20 | 90 days supply | Qty: 90 | Fill #1

## 2019-05-12 MED FILL — traZODone HCL 50 MG TABS: 50 | 90 days supply | Qty: 135 | Fill #0

## 2019-05-12 MED FILL — CLORAZEPATE 7.5 MG TABLET: 7.5 | 90 days supply | Qty: 90 | Fill #0

## 2019-05-12 NOTE — Telephone Encounter (Signed)
Requesting:Tranxene Contract:yes UDS:n/a Last OV:12/04/18 Next OV:07/09/19 Last Refill:01/30/19  #[id number] Database:   Please advise

## 2019-06-22 MED FILL — FLUoxetine HCL 20 MG CAPS: 20 | 90 days supply | Qty: 180 | Fill #1

## 2019-06-24 ENCOUNTER — Other Ambulatory Visit: Payer: Self-pay | Admitting: Family Medicine

## 2019-06-24 MED FILL — TOPIRAMATE 25 MG TAB: 25 | 90 days supply | Qty: 90 | Fill #0

## 2019-07-09 ENCOUNTER — Encounter: Payer: Self-pay | Admitting: Family Medicine

## 2019-07-09 ENCOUNTER — Other Ambulatory Visit: Payer: Self-pay

## 2019-07-09 ENCOUNTER — Ambulatory Visit (INDEPENDENT_AMBULATORY_CARE_PROVIDER_SITE_OTHER): Payer: BC Managed Care – PPO | Admitting: Family Medicine

## 2019-07-09 VITALS — BP 110/60 | Wt 192.0 lb

## 2019-07-09 DIAGNOSIS — R059 Cough, unspecified: Secondary | ICD-10-CM

## 2019-07-09 DIAGNOSIS — E782 Mixed hyperlipidemia: Secondary | ICD-10-CM | POA: Diagnosis not present

## 2019-07-09 DIAGNOSIS — Z0001 Encounter for general adult medical examination with abnormal findings: Secondary | ICD-10-CM

## 2019-07-09 DIAGNOSIS — G4700 Insomnia, unspecified: Secondary | ICD-10-CM

## 2019-07-09 DIAGNOSIS — Z0000 Encounter for general adult medical examination without abnormal findings: Secondary | ICD-10-CM

## 2019-07-09 DIAGNOSIS — R079 Chest pain, unspecified: Secondary | ICD-10-CM

## 2019-07-09 DIAGNOSIS — R05 Cough: Secondary | ICD-10-CM

## 2019-07-09 DIAGNOSIS — D693 Immune thrombocytopenic purpura: Secondary | ICD-10-CM

## 2019-07-12 DIAGNOSIS — G4700 Insomnia, unspecified: Secondary | ICD-10-CM | POA: Insufficient documentation

## 2019-07-12 DIAGNOSIS — R05 Cough: Secondary | ICD-10-CM | POA: Insufficient documentation

## 2019-07-12 DIAGNOSIS — U071 COVID-19: Secondary | ICD-10-CM | POA: Insufficient documentation

## 2019-07-12 HISTORY — DX: COVID-19: U07.1

## 2019-07-12 NOTE — Assessment & Plan Note (Signed)
Encouraged good sleep hygiene such as dark, quiet room. No blue/green glowing lights such as computer screens in bedroom. No alcohol or stimulants in evening. Cut down on caffeine as able. Regular exercise is helpful but not just prior to bed time. Melatonin prn

## 2019-07-12 NOTE — Assessment & Plan Note (Signed)
Patient encouraged to maintain heart healthy diet, regular exercise, adequate sleep. Consider daily probiotics. Take medications as prescribed. Labs ordered and reviewed 

## 2019-07-12 NOTE — Assessment & Plan Note (Signed)
Asymptomatic and following with hematology 

## 2019-07-12 NOTE — Assessment & Plan Note (Signed)
COVID testing ordered. Has had a cough off and on for months.

## 2019-07-12 NOTE — Progress Notes (Signed)
Virtual Visit via Video Note  I connected with Laura Bender on 07/09/19 at  1:20 PM EDT by a video enabled telemedicine application and verified that I am speaking with the correct person using two identifiers.  Location: Patient: home Provider: office   I discussed the limitations of evaluation and management by telemedicine and the availability of in person appointments. The patient expressed understanding and agreed to proceed. Magdalene Molly, CMA was able to get patient set up on visit, video   Subjective:    [name], female    DOB: [birth year], 63 y.o.   MRN: [id number]  No chief complaint on file.   HPI Patient is in today for annual preventative exam and follow up on chronic medical concerns including ITP, hyperlipidemia, and Depression. She is struggling with ongoing fatigue and a slight cough after having 3 rounds of antibiotics over the past 3 months. She is noting episodes of atypical, substernal chest pain that is sharp and resolves quickly. Has some shortness of breath with these episodes some of the time. No fevers or chills. She struggles some with insomnia and has used some Melatonin. She is trying to maintain quarantine when able. No recent hospitalizations. She is trying to maintain a heart healthy diet and regular exercise. Denies palpHA/congestion/fevers/GI or GU c/o. Taking meds as prescribed  Past Medical History:  Diagnosis Date  . Advanced care planning/counseling discussion 09/01/2014  . Allergic state 04/17/2013  . Anxiety   . 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 Clotting disorder ([place])   . Depression 03-04-12   tx. meds  . Depression with anxiety 04/17/2013  . ETOH abuse   . Fibromyalgia 03-04-12   Sporadic pain  . GERD (gastroesophageal reflux disease) 08/17/2013  . History of transfusion of platelets X 1   "related to ITP"  . Hyperlipidemia, mixed 10/16/2015  . ITP (idiopathic thrombocytopenic purpura) 03-04-12   Dx.  '95- blow  normal levels, but stable.  . Lipoma 03-04-12   Rt. back about scapular ares  . Migraine    "that's what the dr thinks I had yesterday; still unsure; never had one before" (06/19/2016)  . Overweight 03/06/2015  . PONV (postoperative nausea and vomiting)   . Preventative health care 04/17/2013; 08/17/2013  . Solitary pulmonary nodule 08/17/2013   LLL seen on CT in March 2014    Past Surgical History:  Procedure Laterality Date  . APPENDECTOMY  1995  . BREAST BIOPSY Left 2012   needle biopsy(benign)-Titanium needle marker remains  . CHOLECYSTECTOMY N/A 04/24/2013   Procedure: LAPAROSCOPIC CHOLECYSTECTOMY WITH INTRAOPERATIVE CHOLANGIOGRAM;  Surgeon: Haywood Lasso, MD;  Location: [facility];  Service: General;  Laterality: N/A;  . COLONOSCOPY  2008   WNL, Dr Cristina Gong  . IRRIGATION AND DEBRIDEMENT ABSCESS  ~ 2005 X 2   buttocks  . LIPOMA EXCISION  ~ 04/2016   "base of my neck"  . MASS EXCISION  03/10/2012   Procedure: EXCISION MASS;  Surgeon: Odis Hollingshead, MD;  Location: [facility];  Service: General;  Laterality: N/A;  Removal of back lipoma  . 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 SPLENECTOMY, TOTAL  1995  . TOTAL ABDOMINAL HYSTERECTOMY  1995   total, endometriosis, fibroid, ovarian atrophy    Family History  Problem Relation Age of Onset  . Heart disease Maternal Grandmother   . Heart disease Maternal Grandfather   . Heart attack Maternal Grandfather 62  . Heart disease Paternal Grandmother   .  Heart attack Paternal Grandmother 86  . Heart disease Paternal Grandfather   . Cancer Paternal Grandfather        chest- smoker  . Diabetes Mother        type 2- controlled by diet  . Atrial fibrillation Father   . Hyperlipidemia Sister   . Migraines Sister   . Cancer Sister        pancreatic  . Depression Sister   . COPD Sister     Social History   Socioeconomic History  . Marital status: Single    Spouse name: Not on file  . Number of children: 0  . Years of  education: 4  . Highest education level: Not on file  Occupational History  . Not on file  Social Needs  . Financial resource strain: Not on file  . Food insecurity    Worry: Not on file    Inability: Not on file  . Transportation needs    Medical: Not on file    Non-medical: Not on file  Tobacco Use  . Smoking status: Former Smoker    Packs/day: 0.75    Years: 5.00    Pack years: 3.75    Types: Cigarettes    Quit date: 02/21/1983    Years since quitting: 36.4  . Smokeless tobacco: Never Used  Substance and Sexual Activity  . Alcohol use: Yes    Alcohol/week: 12.0 standard drinks    Types: 12 Cans of beer per week  . Drug use: No  . 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 Physical activity    Days per week: Not on file    Minutes per session: Not on file  . Stress: Not on file  Relationships  . Social Herbalist on phone: Not on file    Gets together: Not on file    Attends religious service: Not on file    Active member of club or organization: Not on file    Attends meetings of clubs or organizations: Not on file    Relationship status: Not on file  . Intimate partner violence    Fear of current or ex partner: Not on file    Emotionally abused: Not on file    Physically abused: Not on file    Forced sexual activity: Not on file  Other Topics Concern  . Not on file  Social History Narrative   Lives at home w/ her roommate   Right-handed   Caffeine: has cut down to 1-2 cups 1/2 caf coffee in the a.m.    Outpatient Medications Prior to Visit  Medication Sig Dispense Refill  . acetaminophen (TYLENOL) 500 MG tablet Take 1,000 mg by mouth every 6 (six) hours as needed for pain.    Marland Kitchen albuterol (PROVENTIL HFA;VENTOLIN HFA) 108 (90 Base) MCG/ACT inhaler Inhale 2 puffs into the lungs every 6 (six) hours as needed for wheezing or shortness of breath. 1  Inhaler 2  . benzonatate (TESSALON) 100 MG capsule Take 1 capsule (100 mg total) by mouth 3 (three) times daily as needed for cough. 30 capsule 0  . Calcium Carb-Cholecalciferol (CALCIUM 600 + D PO) Take 1 tablet by mouth daily.    . Cholecalciferol (VITAMIN D) 2000 units CAPS Take 2,000 Units by mouth daily.    . clorazepate (TRANXENE) 7.5 MG tablet TAKE 1 TABLET BY MOUTH EVERY NIGHT AT  BEDTIME 90 tablet 0  . docusate sodium (COLACE) 100 MG capsule Take 100 mg by mouth daily.    Marland Kitchen doxycycline (VIBRA-TABS) 100 MG tablet 1 tab po bid x 10 days 20 tablet 0  . FLUoxetine (PROZAC) 20 MG capsule TAKE 2 CAPUSLES BY MOUTH DAILY 180 capsule 1  . fluticasone (FLONASE) 50 MCG/ACT nasal spray Place 2 sprays into both nostrils daily. 16 g 1  . fluticasone (FLOVENT HFA) 110 MCG/ACT inhaler Inhale 2 puffs into the lungs 2 (two) times daily. 1 Inhaler 12  . hydrocortisone (ANUSOL-HC) 25 MG suppository Place 1 suppository (25 mg total) rectally 2 (two) times daily. 12 suppository 2  . levofloxacin (LEVAQUIN) 500 MG tablet Take 1 tablet (500 mg total) by mouth daily. 10 tablet 0  . loratadine (CLARITIN) 10 MG tablet Take 1 tablet (10 mg total) by mouth daily. 30 tablet 11  . omeprazole (PRILOSEC) 20 MG capsule TAKE ONE CAPSULE BY MOUTH DAILY 90 capsule 1  . topiramate (TOPAMAX) 25 MG tablet TAKE 1 TABLET (25 MG TOTAL) BY MOUTH DAILY. 90 tablet 1  . traZODone (DESYREL) 50 MG tablet TAKE 1 & 1/2 TABLETS (75 MG TOTAL) BY MOUTH AT BEDTIME. 135 tablet 1   No facility-administered medications prior to visit.     Allergies  Allergen Reactions  . Codeine Nausea And Vomiting    Review of Systems  Constitutional: Negative for chills, fever and malaise/fatigue.  HENT: Negative for congestion and hearing loss.   Eyes: Negative for discharge.  Respiratory: Positive for cough and shortness of breath. Negative for sputum production.   Cardiovascular: Positive for chest pain. Negative for palpitations and leg swelling.   Gastrointestinal: Negative for abdominal pain, blood in stool, constipation, diarrhea, heartburn, nausea and vomiting.  Genitourinary: Negative for dysuria, frequency, hematuria and urgency.  Musculoskeletal: Negative for back pain, falls and myalgias.  Skin: Negative for rash.  Neurological: Negative for dizziness, sensory change, loss of consciousness, weakness and headaches.  Endo/Heme/Allergies: Negative for environmental allergies. Does not bruise/bleed easily.  Psychiatric/Behavioral: Negative for depression and suicidal ideas. The patient is not nervous/anxious and does not have insomnia.        Objective:    Physical Exam Constitutional:      Appearance: Normal appearance. She is not ill-appearing.  HENT:     Head: Normocephalic and atraumatic.     Nose: Nose normal.  Eyes:     General:        Right eye: No discharge.        Left eye: No discharge.  Pulmonary:     Effort: Pulmonary effort is normal.  Neurological:     Mental Status: She is alert and oriented to person, place, and time.  Psychiatric:        Mood and Affect: Mood normal.        Behavior: Behavior normal.     BP 110/60 (BP Location: Left Arm, Patient Position: Sitting, Cuff Size: Normal)   Wt 192 lb (87.1 kg)   BMI 29.19 kg/m  Wt Readings from Last 3 Encounters:  07/09/19 192 lb (87.1 kg)  12/05/18 187 lb 6.3 oz (85 kg)  12/04/18 187 lb 12.8 oz (85.2 kg)    Diabetic Foot Exam - Simple   No data filed     Lab Results  Component Value Date   WBC 5.4 05/22/2018   HGB 13.1 05/22/2018   HCT 39.1 05/22/2018   PLT 60.0 (L) 05/22/2018   GLUCOSE 86 05/22/2018   CHOL 208 (H)  05/22/2018   TRIG 119.0 05/22/2018   HDL 64.20 05/22/2018   LDLDIRECT 110.0 11/05/2017   LDLCALC 120 (H) 05/22/2018   ALT 15 05/22/2018   AST 20 05/22/2018   NA 138 05/22/2018   K 4.7 05/22/2018   CL 103 05/22/2018   CREATININE 0.90 05/22/2018   BUN 25 (H) 05/22/2018   CO2 28 05/22/2018   TSH 1.48 05/22/2018   INR  0.89 05/14/2018   HGBA1C 4.8 06/19/2016    Lab Results  Component Value Date   TSH 1.48 05/22/2018   Lab Results  Component Value Date   WBC 5.4 05/22/2018   HGB 13.1 05/22/2018   HCT 39.1 05/22/2018   MCV 93.6 05/22/2018   PLT 60.0 (L) 05/22/2018   Lab Results  Component Value Date   NA 138 05/22/2018   K 4.7 05/22/2018   CO2 28 05/22/2018   GLUCOSE 86 05/22/2018   BUN 25 (H) 05/22/2018   CREATININE 0.90 05/22/2018   BILITOT 0.4 05/22/2018   ALKPHOS 115 05/22/2018   AST 20 05/22/2018   ALT 15 05/22/2018   PROT 7.9 05/22/2018   ALBUMIN 4.4 05/22/2018   CALCIUM 9.8 05/22/2018   ANIONGAP 7 06/19/2016   GFR 67.56 05/22/2018   Lab Results  Component Value Date   CHOL 208 (H) 05/22/2018   Lab Results  Component Value Date   HDL 64.20 05/22/2018   Lab Results  Component Value Date   LDLCALC 120 (H) 05/22/2018   Lab Results  Component Value Date   TRIG 119.0 05/22/2018   Lab Results  Component Value Date   CHOLHDL 3 05/22/2018   Lab Results  Component Value Date   HGBA1C 4.8 06/19/2016       Assessment & Plan:   Problem List Items Addressed This Visit    Idiopathic thrombocytopenic purpura ([place]) (Chronic)    Asymptomatic and following with hematology      Preventative health care    Patient encouraged to maintain heart healthy diet, regular exercise, adequate sleep. Consider daily probiotics. Take medications as prescribed. Labs ordered and reviewed      Hyperlipidemia, mixed    Encouraged heart healthy diet, increase exercise, avoid trans fats, consider a krill oil cap daily      Relevant Orders   Lipid panel   Chest pain - Primary    Having episodes of intermittent atypical chest pain with some sob noted with it at times. She has noted some substernal sharp passing chest pains. Referred to cardiology for evaluation and she should seek care if pain occurs and does not resolve.       Relevant Orders   Ambulatory referral to Cardiology   DG  Chest 2 View   CBC   Comprehensive metabolic panel   TSH   Cough    COVID testing ordered. Has had a cough off and on for months.       Relevant Orders   SAR CoV2 Serology (COVID 19)AB(IGG)IA   Insomnia    Encouraged good sleep hygiene such as dark, quiet room. No blue/green glowing lights such as computer screens in bedroom. No alcohol or stimulants in evening. Cut down on caffeine as able. Regular exercise is helpful but not just prior to bed time. Melatonin prn         I am having Nobie Putnam. Leppert "Pam" maintain her acetaminophen, Calcium Carb-Cholecalciferol (CALCIUM 600 + D PO), docusate sodium, Vitamin D, hydrocortisone, loratadine, fluticasone, benzonatate, doxycycline, fluticasone, albuterol, levofloxacin, omeprazole, FLUoxetine, clorazepate, traZODone, and  topiramate.  No orders of the defined types were placed in this encounter.    I discussed the assessment and treatment plan with the patient. The patient was provided an opportunity to ask questions and all were answered. The patient agreed with the plan and demonstrated an understanding of the instructions.   The patient was advised to call back or seek an in-person evaluation if the symptoms worsen or if the condition fails to improve as anticipated.  I provided 25 minutes of non-face-to-face time during this encounter.   Penni Homans, MD

## 2019-07-12 NOTE — Assessment & Plan Note (Signed)
Having episodes of intermittent atypical chest pain with some sob noted with it at times. She has noted some substernal sharp passing chest pains. Referred to cardiology for evaluation and she should seek care if pain occurs and does not resolve.

## 2019-07-12 NOTE — Assessment & Plan Note (Signed)
Encouraged heart healthy diet, increase exercise, avoid trans fats, consider a krill oil cap daily 

## 2019-07-16 ENCOUNTER — Other Ambulatory Visit: Payer: Self-pay

## 2019-07-16 ENCOUNTER — Ambulatory Visit (HOSPITAL_BASED_OUTPATIENT_CLINIC_OR_DEPARTMENT_OTHER)
Admission: RE | Admit: 2019-07-16 | Discharge: 2019-07-16 | Disposition: A | Discharge status: Home / Self Care | Payer: BC Managed Care – PPO | Source: Ambulatory Visit | Attending: Family Medicine | Admitting: Family Medicine

## 2019-07-16 ENCOUNTER — Ambulatory Visit (INDEPENDENT_AMBULATORY_CARE_PROVIDER_SITE_OTHER): Payer: BC Managed Care – PPO | Admitting: *Deleted

## 2019-07-16 ENCOUNTER — Other Ambulatory Visit (INDEPENDENT_AMBULATORY_CARE_PROVIDER_SITE_OTHER): Payer: BC Managed Care – PPO

## 2019-07-16 DIAGNOSIS — Z23 Encounter for immunization: Secondary | ICD-10-CM | POA: Diagnosis not present

## 2019-07-16 DIAGNOSIS — R05 Cough: Secondary | ICD-10-CM

## 2019-07-16 DIAGNOSIS — R079 Chest pain, unspecified: Secondary | ICD-10-CM

## 2019-07-16 DIAGNOSIS — R059 Cough, unspecified: Secondary | ICD-10-CM

## 2019-07-16 DIAGNOSIS — E782 Mixed hyperlipidemia: Secondary | ICD-10-CM

## 2019-07-16 LAB — LIPID PANEL
Cholesterol: 201 mg/dL — ABNORMAL HIGH (ref 0–200)
HDL: 56.4 mg/dL (ref >39.00)
LDL Cholesterol: 109 mg/dL — ABNORMAL HIGH (ref 0–99)
NonHDL: 144.29
Total CHOL/HDL Ratio: 4
Triglycerides: 178 mg/dL — ABNORMAL HIGH (ref 0.0–149.0)
VLDL: 35.6 mg/dL (ref 0.0–40.0)

## 2019-07-16 LAB — COMPREHENSIVE METABOLIC PANEL
ALT: 13 U/L (ref 0–35)
AST: 19 U/L (ref 0–37)
Albumin: 4.4 g/dL (ref 3.5–5.2)
Alkaline Phosphatase: 105 U/L (ref 39–117)
BUN: 19 mg/dL (ref 6–23)
CO2: 28 mEq/L (ref 19–32)
Calcium: 9.8 mg/dL (ref 8.4–10.5)
Chloride: 103 mEq/L (ref 96–112)
Creatinine, Ser: 0.93 mg/dL (ref 0.40–1.20)
GFR: 60.98 mL/min (ref >60.00)
Glucose, Bld: 92 mg/dL (ref 70–99)
Potassium: 4.3 mEq/L (ref 3.5–5.1)
Sodium: 137 mEq/L (ref 135–145)
Total Bilirubin: 0.5 mg/dL (ref 0.2–1.2)
Total Protein: 7.7 g/dL (ref 6.0–8.3)

## 2019-07-16 LAB — CBC
HCT: 41.7 % (ref 36.0–46.0)
Hemoglobin: 13.9 g/dL (ref 12.0–15.0)
MCHC: 33.4 g/dL (ref 30.0–36.0)
MCV: 95.4 fl (ref 78.0–100.0)
Platelets: 72 10*3/uL — ABNORMAL LOW (ref 150.0–400.0)
RBC: 4.37 Mil/uL (ref 3.87–5.11)
RDW: 13.5 % (ref 11.5–15.5)
WBC: 6.1 10*3/uL (ref 4.0–10.5)

## 2019-07-16 LAB — TSH: TSH: 1.69 u[IU]/mL (ref 0.35–4.50)

## 2019-07-16 NOTE — Progress Notes (Signed)
Patient here for pneumovax vaccine per Dr. Charlett Blake.  Vaccine given and patient tolerated well.

## 2019-07-17 LAB — SAR COV2 SEROLOGY (COVID19)AB(IGG),IA: SARS CoV2 AB IGG: NEGATIVE

## 2019-07-29 IMAGING — CT CT NECK W/ CM
4 of 5 series · 14 of 33 positions shown, 16 images · IV contrast (iopamidol)
Comparison: 06/19/2016 CT angiogram neck.  02/13/2016 CT neck.

CLINICAL DATA: 62 y/o  F; left-sided

EXAM:
CT NECK WITH CONTRAST
TECHNIQUE: Multidetector CT imaging of the neck was performed using the
standard protocol following the bolus administration of intravenous
contrast.
CONTRAST:  80mL ODE41L-UAA IOPAMIDOL (ODE41L-UAA) INJECTION 61%

[Series 3: axial neck · axial · 0.44mm/px · z∈[+1012,+1180]mm · 4 of 142 slices shown, 5 images]
[im 29/142  soft-tissue]
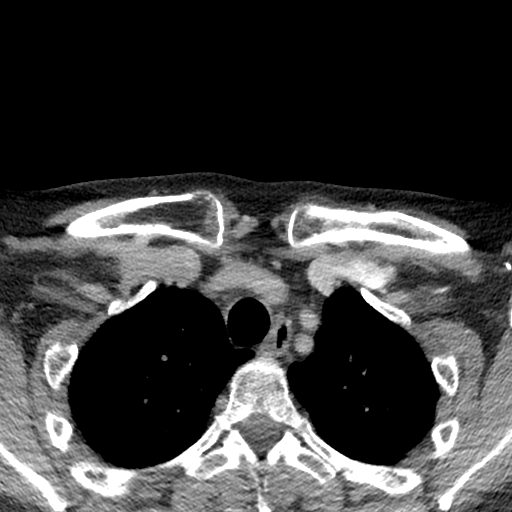
[im 29/142  bone]
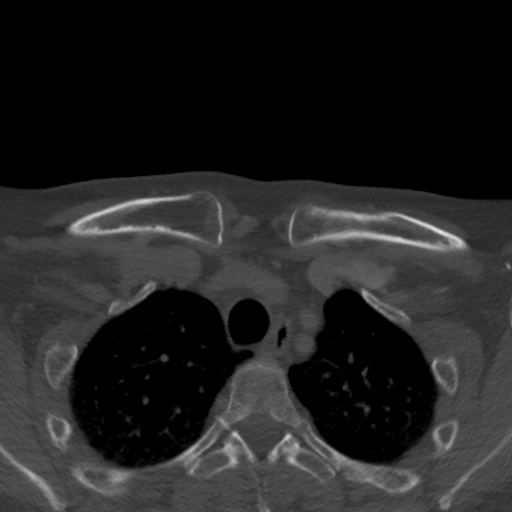
[im 57/142  bone]
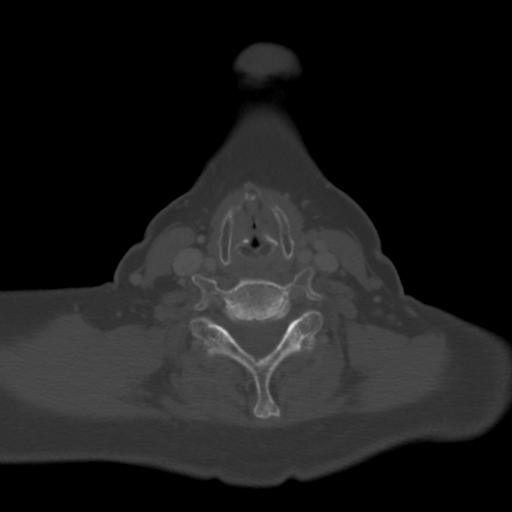
[im 85/142  bone]
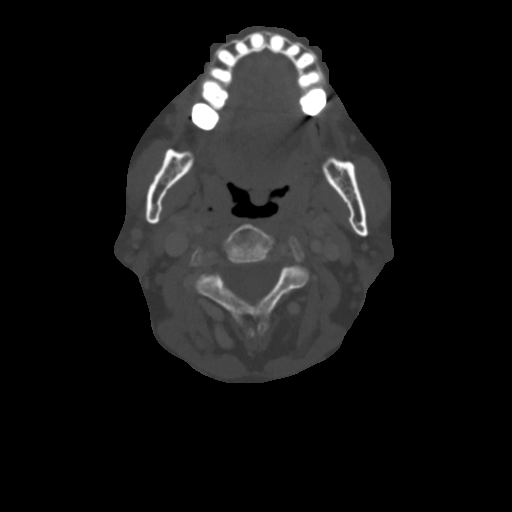
[im 113/142  bone]
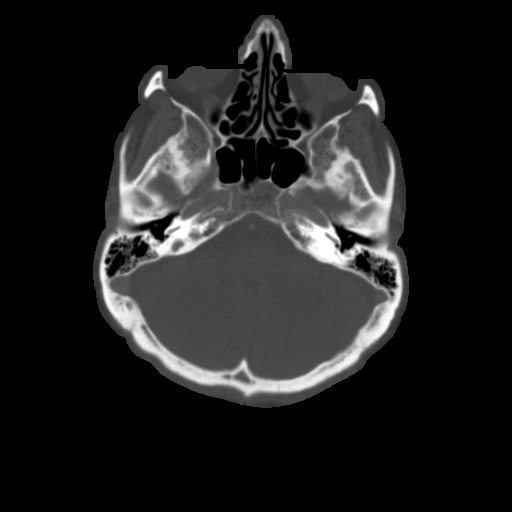

[Series 7: sag neck · sagittal · 0.52mm/px · 5 of 101 slices shown, 6 images]
[im 34/101  bone]
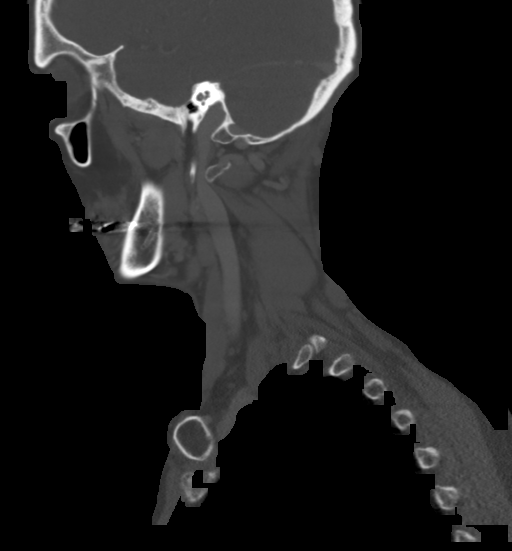
[im 42/101  bone]
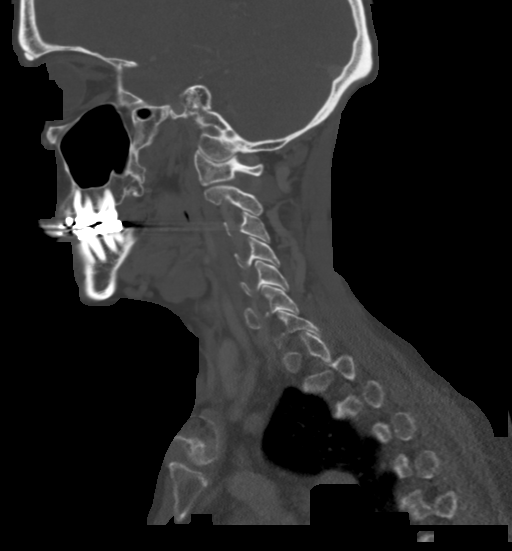
[im 51/101  soft-tissue]
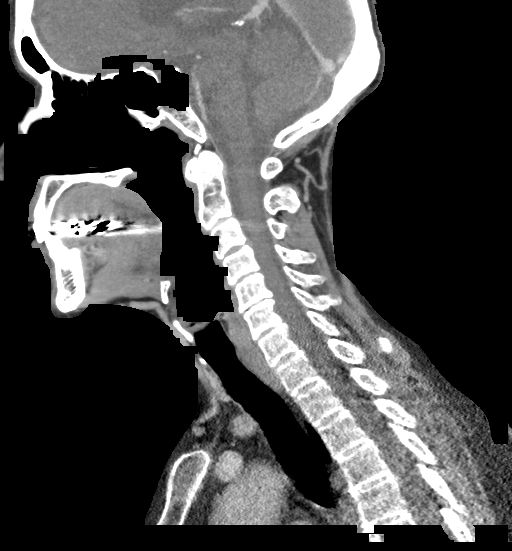
[im 51/101  bone]
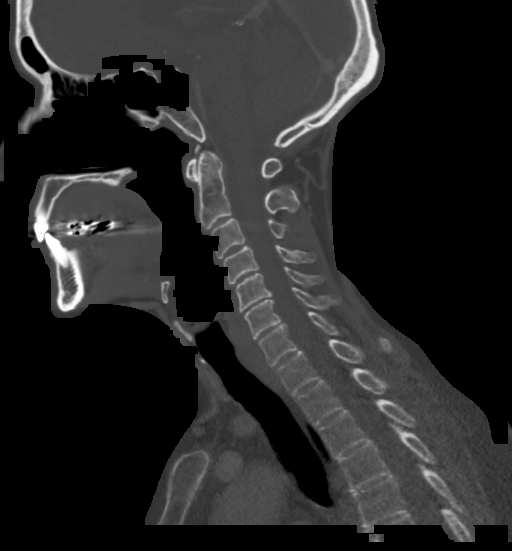
[im 59/101  bone]
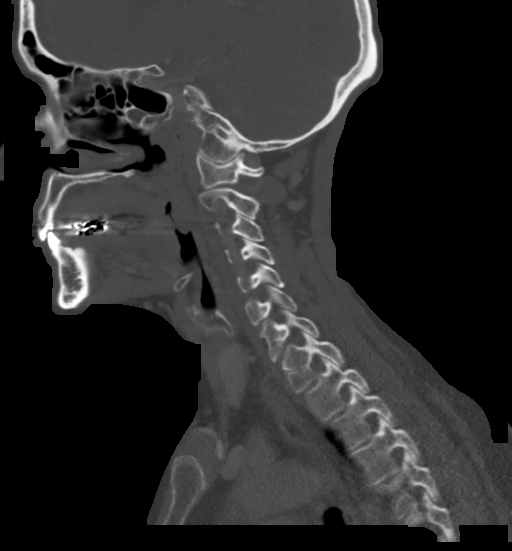
[im 67/101  bone]
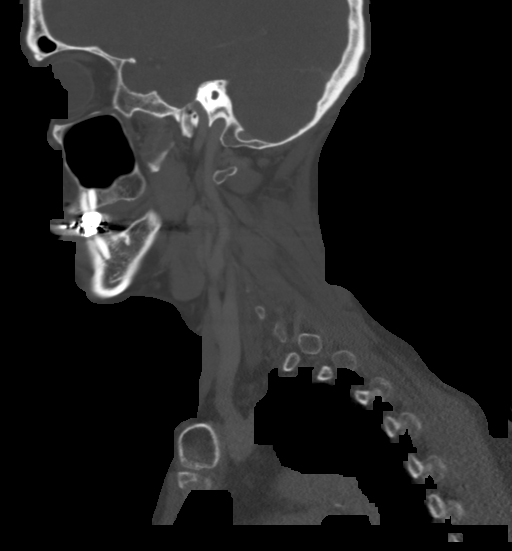

[Series 8: cor neck · coronal · 0.39mm/px · 3 of 133 slices shown]
[im 27/133  bone]
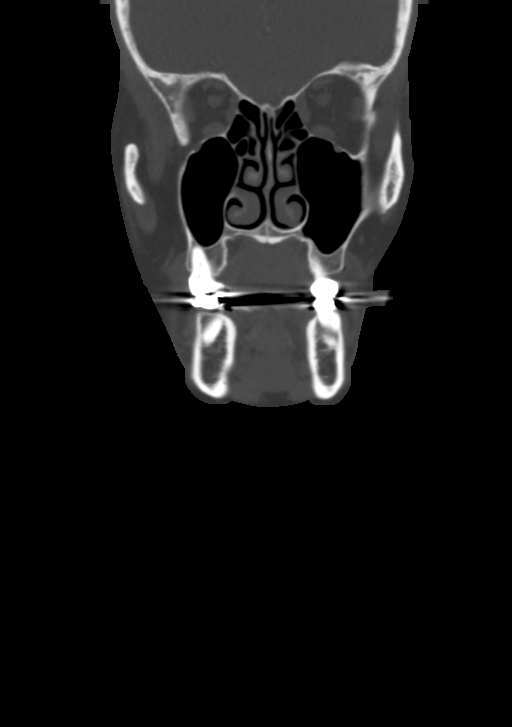
[im 53/133  bone]
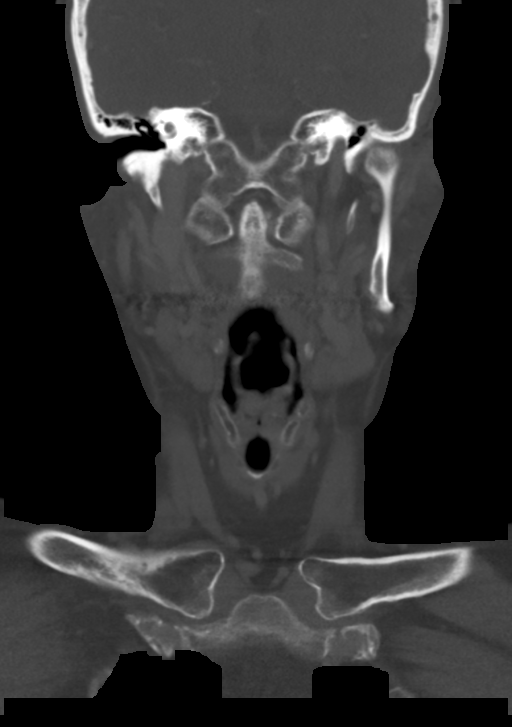
[im 80/133  bone]
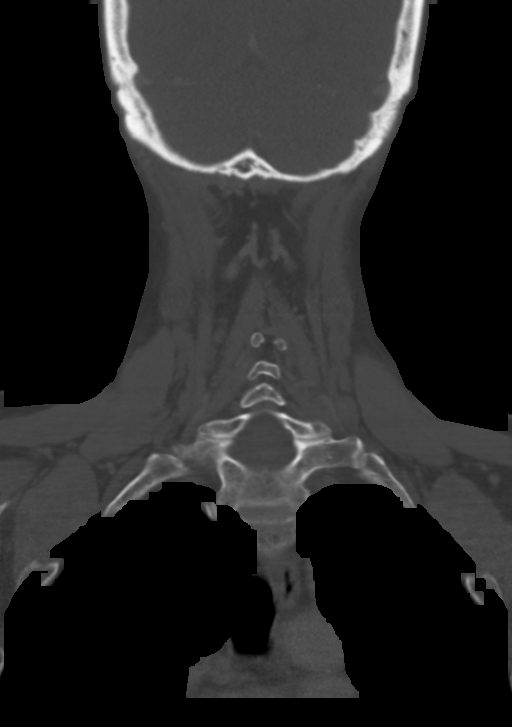

[Series 9: orthogonal ax · axial · 0.39mm/px · z∈[+1009,+1065]mm · 2 of 141 slices shown]
[im 29/141  bone]
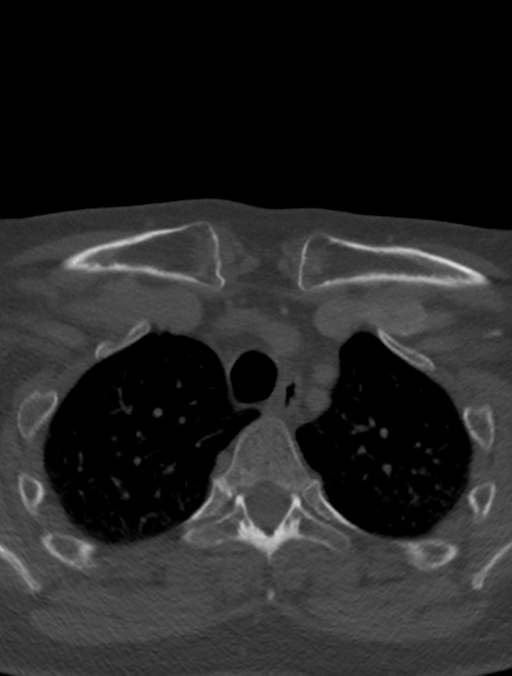
[im 57/141  bone]
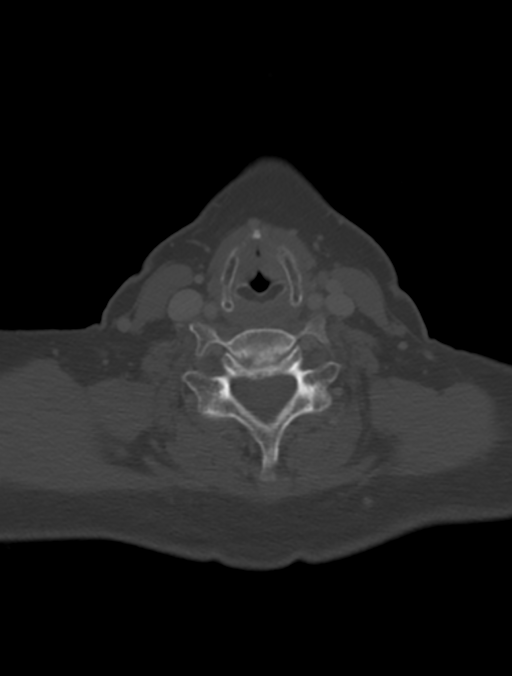

[14 of 33 positions shown; findings below may reference images not displayed]

FINDINGS: Pharynx and larynx: Normal. No mass or swelling.

Salivary glands: Stable nodularity within the left parotid tail
adjacent to a surgical scar is stable in comparison with prior
studies. For example, a nodule at the inferior most margin measures
up to 10 mm, previously 10 mm (series 3, image 68). Adjacent upper
cervical lymph nodes are stable in size. No findings of perineural
spread of disease. Normal appearance of right parotid gland and
submandibular glands. Sublingual glands obscured by streak artifact
from dental hardware.

Thyroid: Normal.

Lymph nodes: None enlarged or abnormal density.

Vascular: Negative.

Limited intracranial: Negative.

Visualized orbits: Negative.

Mastoids and visualized paranasal sinuses: Clear.

Skeleton: No acute or aggressive process. Mild multilevel discogenic
degenerative changes of the cervical spine. No significant bony
foraminal or canal stenosis.

Upper chest: Negative.

Other: Scar tissue within the left posterior neck soft tissues from
lipoma removal, no recurrent lipoma identified.
IMPRESSION: 1. Stable nodularity within the left parotid tail adjacent to a
surgical scar, probably recurrent parotid neoplasm.
2. Scarring at site of left posterior neck lipoma resection, no
recurrent lipoma identified.

By: Mitchou Kennou M.D.

## 2019-08-24 MED FILL — traZODone HCL 50 MG TABS: 50 | 90 days supply | Qty: 135 | Fill #1

## 2019-09-28 ENCOUNTER — Other Ambulatory Visit: Payer: Self-pay | Admitting: Family Medicine

## 2019-09-28 MED FILL — TOPIRAMATE 25 MG TAB: 25 | 90 days supply | Qty: 90 | Fill #1

## 2019-09-28 MED FILL — FLUoxetine HCL 20 MG CAPS: 20 | 90 days supply | Qty: 180 | Fill #0

## 2019-09-28 MED FILL — OMEPRAZOLE 20 MG CAP: 20 | 90 days supply | Qty: 90 | Fill #0

## 2019-11-05 ENCOUNTER — Other Ambulatory Visit: Payer: Self-pay

## 2019-11-05 DIAGNOSIS — Z20822 Contact with and (suspected) exposure to covid-19: Secondary | ICD-10-CM

## 2019-11-07 LAB — NOVEL CORONAVIRUS, NAA: SARS-CoV-2, NAA: NOT DETECTED

## 2019-11-09 ENCOUNTER — Ambulatory Visit (INDEPENDENT_AMBULATORY_CARE_PROVIDER_SITE_OTHER): Payer: BC Managed Care – PPO | Admitting: Family Medicine

## 2019-11-09 ENCOUNTER — Other Ambulatory Visit: Payer: Self-pay

## 2019-11-09 ENCOUNTER — Encounter: Payer: Self-pay | Admitting: *Deleted

## 2019-11-09 DIAGNOSIS — E782 Mixed hyperlipidemia: Secondary | ICD-10-CM

## 2019-11-09 DIAGNOSIS — G4700 Insomnia, unspecified: Secondary | ICD-10-CM | POA: Diagnosis not present

## 2019-11-09 DIAGNOSIS — F1010 Alcohol abuse, uncomplicated: Secondary | ICD-10-CM

## 2019-11-09 DIAGNOSIS — F418 Other specified anxiety disorders: Secondary | ICD-10-CM

## 2019-11-09 DIAGNOSIS — L509 Urticaria, unspecified: Secondary | ICD-10-CM | POA: Insufficient documentation

## 2019-11-09 NOTE — Assessment & Plan Note (Signed)
Increase Loratadine to bid, Pepcid bid and report if worsens.

## 2019-11-09 NOTE — Assessment & Plan Note (Signed)
Encouraged good sleep hygiene such as dark, quiet room. No blue/green glowing lights such as computer screens in bedroom. No alcohol or stimulants in evening. Cut down on caffeine as able. Regular exercise is helpful but not just prior to bed time.  

## 2019-11-09 NOTE — Assessment & Plan Note (Signed)
Encouraged heart healthy diet, increase exercise, avoid trans fats, consider a krill oil cap daily 

## 2019-11-09 NOTE — Assessment & Plan Note (Signed)
She acknowledges she has increased to up to 6 beers a day. On a better day is only 2 or 3. She is not ready to quit at this time but she is coached that if she gets ready to cut back and or quit she should reach out so we can help her consider her options.

## 2019-11-09 NOTE — Progress Notes (Signed)
Virtual Visit via Video Note  I connected with Laura Bender on 11/09/19 at 10:20 AM EST by a video enabled telemedicine application and verified that I am speaking with the correct person using two identifiers.  Location: Patient: home Provider: home   I discussed the limitations of evaluation and management by telemedicine and the availability of in person appointments. The patient expressed understanding and agreed to proceed. Kem Boroughs, CMA was able to get the patient set up on a video visit    Subjective:    [name], female    DOB: 1956/05/30, 63 y.o.   MRN: [id number]  Chief Complaint  Patient presents with  . Depression  . Hyperlipidemia  . covid follow up     test was negative and diarrhea is now gone    HPI Patient is in today for follow up on chronic medical concerns including allergies, depression, anxiety, insomnia, reflux and more. No recent febrile illness or recent hospitalizations. She is noting urticaria off and on with stress and when she is cold. Occurs mostly on her extremities and has been occurring for past year. Denies CP/palp/SOB/HA/congestion/fevers/GI or GU c/o. Taking meds as prescribed. She lost her sister to end stage COPD at age 10 in August, then she lost one of her dogs and now her father has end stage CHF. She is working mostly from home and feeling isolated as well. She feels she is managing adequately and will not increase meds for now. she is trying to do some grief work with her sister's hospice group but they have been curatiled due to [place]  Past Medical History:  Diagnosis Date  . Advanced care planning/counseling discussion 09/01/2014  . Allergic state 04/17/2013  . Anxiety   . 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 Clotting disorder ([place])   . Depression 03-04-12   tx. meds  . Depression with anxiety 04/17/2013  . ETOH abuse   . Fibromyalgia 03-04-12   Sporadic pain  . GERD (gastroesophageal reflux  disease) 08/17/2013  . History of transfusion of platelets X 1   "related to ITP"  . Hyperlipidemia, mixed 10/16/2015  . ITP (idiopathic thrombocytopenic purpura) 03-04-12   Dx.  '95- blow normal levels, but stable.  . Lipoma 03-04-12   Rt. back about scapular ares  . Migraine    "that's what the dr thinks I had yesterday; still unsure; never had one before" (06/19/2016)  . Overweight 03/06/2015  . PONV (postoperative nausea and vomiting)   . Preventative health care 04/17/2013; 08/17/2013  . Solitary pulmonary nodule 08/17/2013   LLL seen on CT in March 2014    Past Surgical History:  Procedure Laterality Date  . APPENDECTOMY  1995  . BREAST BIOPSY Left 2012   needle biopsy(benign)-Titanium needle marker remains  . CHOLECYSTECTOMY N/A 04/24/2013   Procedure: LAPAROSCOPIC CHOLECYSTECTOMY WITH INTRAOPERATIVE CHOLANGIOGRAM;  Surgeon: Haywood Lasso, MD;  Location: [facility];  Service: General;  Laterality: N/A;  . COLONOSCOPY  2008   WNL, Dr Cristina Gong  . IRRIGATION AND DEBRIDEMENT ABSCESS  ~ 2005 X 2   buttocks  . LIPOMA EXCISION  ~ 04/2016   "base of my neck"  . MASS EXCISION  03/10/2012   Procedure: EXCISION MASS;  Surgeon: Odis Hollingshead, MD;  Location: [facility];  Service: General;  Laterality: N/A;  Removal of back lipoma  . 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 SPLENECTOMY, TOTAL  1995  . TOTAL ABDOMINAL HYSTERECTOMY  1995   total, endometriosis, fibroid, ovarian atrophy    Family History  Problem Relation Age of Onset  . Heart disease Maternal Grandmother   . Heart disease Maternal Grandfather   . Heart attack Maternal Grandfather 62  . Heart disease Paternal Grandmother   . Heart attack Paternal Grandmother 86  . Heart disease Paternal Grandfather   . Cancer Paternal Grandfather        chest- smoker  . Diabetes Mother        type 2- controlled by diet  . Atrial fibrillation Father   . Hyperlipidemia Sister   . Migraines Sister   . Cancer Sister         pancreatic  . Depression Sister   . COPD Sister     Social History   Socioeconomic History  . Marital status: Single    Spouse name: Not on file  . Number of children: 0  . Years of education: 22  . Highest education level: Not on file  Occupational History  . Not on file  Social Needs  . Financial resource strain: Not on file  . Food insecurity    Worry: Not on file    Inability: Not on file  . Transportation needs    Medical: Not on file    Non-medical: Not on file  Tobacco Use  . Smoking status: Former Smoker    Packs/day: 0.75    Years: 5.00    Pack years: 3.75    Types: Cigarettes    Quit date: 02/21/1983    Years since quitting: 36.7  . Smokeless tobacco: Never Used  Substance and Sexual Activity  . Alcohol use: Yes    Alcohol/week: 12.0 standard drinks    Types: 12 Cans of beer per week  . Drug use: No  . 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 Physical activity    Days per week: Not on file    Minutes per session: Not on file  . Stress: Not on file  Relationships  . Social Herbalist on phone: Not on file    Gets together: Not on file    Attends religious service: Not on file    Active member of club or organization: Not on file    Attends meetings of clubs or organizations: Not on file    Relationship status: Not on file  . Intimate partner violence    Fear of current or ex partner: Not on file    Emotionally abused: Not on file    Physically abused: Not on file    Forced sexual activity: Not on file  Other Topics Concern  . Not on file  Social History Narrative   Lives at home w/ her roommate   Right-handed   Caffeine: has cut down to 1-2 cups 1/2 caf coffee in the a.m.    Outpatient Medications Prior to Visit  Medication Sig Dispense Refill  . acetaminophen (TYLENOL) 500 MG tablet Take 1,000 mg by mouth every 6 (six) hours as  needed for pain.    Marland Kitchen albuterol (PROVENTIL HFA;VENTOLIN HFA) 108 (90 Base) MCG/ACT inhaler Inhale 2 puffs into the lungs every 6 (six) hours as needed for wheezing or shortness of breath. 1 Inhaler 2  . Calcium Carb-Cholecalciferol (CALCIUM 600 + D PO) Take 1 tablet by mouth daily.    . Cholecalciferol (VITAMIN D) 2000 units CAPS  Take 2,000 Units by mouth daily.    . clorazepate (TRANXENE) 7.5 MG tablet TAKE 1 TABLET BY MOUTH EVERY NIGHT AT BEDTIME 90 tablet 0  . docusate sodium (COLACE) 100 MG capsule Take 100 mg by mouth daily.    Marland Kitchen FLUoxetine (PROZAC) 20 MG capsule TAKE 2 CAPSULES BY MOUTH DAILY 180 capsule 1  . fluticasone (FLONASE) 50 MCG/ACT nasal spray Place 2 sprays into both nostrils daily. 16 g 1  . fluticasone (FLOVENT HFA) 110 MCG/ACT inhaler Inhale 2 puffs into the lungs 2 (two) times daily. 1 Inhaler 12  . hydrocortisone (ANUSOL-HC) 25 MG suppository Place 1 suppository (25 mg total) rectally 2 (two) times daily. 12 suppository 2  . loratadine (CLARITIN) 10 MG tablet Take 1 tablet (10 mg total) by mouth daily. 30 tablet 11  . omeprazole (PRILOSEC) 20 MG capsule TAKE ONE CAPSULE BY MOUTH DAILY 90 capsule 1  . topiramate (TOPAMAX) 25 MG tablet TAKE 1 TABLET (25 MG TOTAL) BY MOUTH DAILY. 90 tablet 1  . traZODone (DESYREL) 50 MG tablet TAKE 1 & 1/2 TABLETS (75 MG TOTAL) BY MOUTH AT BEDTIME. 135 tablet 1  . benzonatate (TESSALON) 100 MG capsule Take 1 capsule (100 mg total) by mouth 3 (three) times daily as needed for cough. 30 capsule 0  . doxycycline (VIBRA-TABS) 100 MG tablet 1 tab po bid x 10 days 20 tablet 0  . levofloxacin (LEVAQUIN) 500 MG tablet Take 1 tablet (500 mg total) by mouth daily. 10 tablet 0   No facility-administered medications prior to visit.     Allergies  Allergen Reactions  . Codeine Nausea And Vomiting    Review of Systems  Constitutional: Positive for malaise/fatigue. Negative for fever.  HENT: Negative for congestion.   Eyes: Negative for blurred  vision.  Respiratory: Negative for shortness of breath.   Cardiovascular: Negative for chest pain, palpitations and leg swelling.  Gastrointestinal: Positive for heartburn. Negative for abdominal pain, blood in stool and nausea.  Genitourinary: Negative for dysuria and frequency.  Musculoskeletal: Negative for falls.  Skin: Positive for itching and rash.  Neurological: Negative for dizziness, loss of consciousness and headaches.  Endo/Heme/Allergies: Negative for environmental allergies.  Psychiatric/Behavioral: Positive for depression and substance abuse. The patient is nervous/anxious and has insomnia.        Objective:    Physical Exam Constitutional:      Appearance: Normal appearance. She is not ill-appearing.  HENT:     Head: Normocephalic and atraumatic.     Nose: Nose normal.  Eyes:     General:        Right eye: No discharge.        Left eye: No discharge.  Pulmonary:     Effort: Pulmonary effort is normal.  Neurological:     Mental Status: She is alert and oriented to person, place, and time.  Psychiatric:        Mood and Affect: Mood normal.        Behavior: Behavior normal.     There were no vitals taken for this visit. Wt Readings from Last 3 Encounters:  07/09/19 192 lb (87.1 kg)  12/05/18 187 lb 6.3 oz (85 kg)  12/04/18 187 lb 12.8 oz (85.2 kg)    Diabetic Foot Exam - Simple   No data filed     Lab Results  Component Value Date   WBC 6.1 07/16/2019   HGB 13.9 07/16/2019   HCT 41.7 07/16/2019   PLT 72.0 (L) 07/16/2019   GLUCOSE  92 07/16/2019   CHOL 201 (H) 07/16/2019   TRIG 178.0 (H) 07/16/2019   HDL 56.40 07/16/2019   LDLDIRECT 110.0 11/05/2017   LDLCALC 109 (H) 07/16/2019   ALT 13 07/16/2019   AST 19 07/16/2019   NA 137 07/16/2019   K 4.3 07/16/2019   CL 103 07/16/2019   CREATININE 0.93 07/16/2019   BUN 19 07/16/2019   CO2 28 07/16/2019   TSH 1.69 07/16/2019   INR 0.89 05/14/2018   HGBA1C 4.8 06/19/2016    Lab Results  Component  Value Date   TSH 1.69 07/16/2019   Lab Results  Component Value Date   WBC 6.1 07/16/2019   HGB 13.9 07/16/2019   HCT 41.7 07/16/2019   MCV 95.4 07/16/2019   PLT 72.0 (L) 07/16/2019   Lab Results  Component Value Date   NA 137 07/16/2019   K 4.3 07/16/2019   CO2 28 07/16/2019   GLUCOSE 92 07/16/2019   BUN 19 07/16/2019   CREATININE 0.93 07/16/2019   BILITOT 0.5 07/16/2019   ALKPHOS 105 07/16/2019   AST 19 07/16/2019   ALT 13 07/16/2019   PROT 7.7 07/16/2019   ALBUMIN 4.4 07/16/2019   CALCIUM 9.8 07/16/2019   ANIONGAP 7 06/19/2016   GFR 60.98 07/16/2019   Lab Results  Component Value Date   CHOL 201 (H) 07/16/2019   Lab Results  Component Value Date   HDL 56.40 07/16/2019   Lab Results  Component Value Date   LDLCALC 109 (H) 07/16/2019   Lab Results  Component Value Date   TRIG 178.0 (H) 07/16/2019   Lab Results  Component Value Date   CHOLHDL 4 07/16/2019   Lab Results  Component Value Date   HGBA1C 4.8 06/19/2016       Assessment & Plan:   Problem List Items Addressed This Visit    Depression with anxiety    She lost her sister to end stage COPD at age 26 in August, then she lost one of her dogs and now her father has end stage CHF. She is working mostly from home and feeling isolated as well. She feels she is managing adequately and will not increase meds for now. Offered counseling and she declines for now she is trying to do some grief work with her sister's hospice group but they have been curatiled due to [place]      Hyperlipidemia, mixed    Encouraged heart healthy diet, increase exercise, avoid trans fats, consider a krill oil cap daily      ETOH abuse    She acknowledges she has increased to up to 6 beers a day. On a better day is only 2 or 3. She is not ready to quit at this time but she is coached that if she gets ready to cut back and or quit she should reach out so we can help her consider her options.       Insomnia    Encouraged  good sleep hygiene such as dark, quiet room. No blue/green glowing lights such as computer screens in bedroom. No alcohol or stimulants in evening. Cut down on caffeine as able. Regular exercise is helpful but not just prior to bed time.       Urticaria    Increase Loratadine to bid, Pepcid bid and report if worsens.         I have discontinued [facility] Gainer "Pam"'s benzonatate, doxycycline, and levofloxacin. I am also having her maintain her acetaminophen, Calcium Carb-Cholecalciferol (CALCIUM 600 +  D PO), docusate sodium, Vitamin D, hydrocortisone, loratadine, fluticasone, fluticasone, albuterol, clorazepate, traZODone, topiramate, FLUoxetine, and omeprazole.  No orders of the defined types were placed in this encounter.   I discussed the assessment and treatment plan with the patient. The patient was provided an opportunity to ask questions and all were answered. The patient agreed with the plan and demonstrated an understanding of the instructions.   The patient was advised to call back or seek an in-person evaluation if the symptoms worsen or if the condition fails to improve as anticipated.  I provided 25 minutes of non-face-to-face time during this encounter.   Penni Homans, MD

## 2019-11-09 NOTE — Assessment & Plan Note (Signed)
She lost her sister to end stage COPD at age 64 in August, then she lost one of her dogs and now her father has end stage CHF. She is working mostly from home and feeling isolated as well. She feels she is managing adequately and will not increase meds for now. Offered counseling and she declines for now she is trying to do some grief work with her sister's hospice group but they have been curatiled due to [place]

## 2019-11-11 ENCOUNTER — Telehealth: Payer: Self-pay | Admitting: Family Medicine

## 2019-11-11 NOTE — Telephone Encounter (Signed)
LM to schedule virtual follow up with Dr. Charlett Blake in Feb 2021

## 2019-11-23 ENCOUNTER — Other Ambulatory Visit: Payer: Self-pay | Admitting: Family Medicine

## 2019-11-23 MED FILL — traZODone HCL 50 MG TABS: 50 | 90 days supply | Qty: 135 | Fill #0

## 2019-11-23 MED FILL — CLORAZEPATE 7.5 MG TABLET: 7.5 | 90 days supply | Qty: 90 | Fill #0

## 2019-11-23 NOTE — Telephone Encounter (Signed)
Requesting:tranxene Contract:n/a UDS:n/a Last OV:11/09/19 Next OV:01/19/20 Last Refill:05/12/19  #[id number] Database:   Please advise

## 2019-12-07 ENCOUNTER — Encounter: Payer: Self-pay | Admitting: Family Medicine

## 2019-12-10 ENCOUNTER — Encounter: Payer: Self-pay | Admitting: Family Medicine

## 2019-12-10 ENCOUNTER — Other Ambulatory Visit: Payer: Self-pay | Admitting: Family Medicine

## 2019-12-10 ENCOUNTER — Ambulatory Visit: Payer: BC Managed Care – PPO | Attending: Internal Medicine

## 2019-12-10 DIAGNOSIS — Z20822 Contact with and (suspected) exposure to covid-19: Secondary | ICD-10-CM | POA: Diagnosis not present

## 2019-12-10 MED ORDER — DOXYCYCLINE HYCLATE 100 MG PO TABS: 100.0000 mg | ORAL_TABLET | Freq: Two times a day (BID) | ORAL | 0 refills | Status: DC

## 2019-12-10 MED ORDER — HYDROCODONE-HOMATROPINE 5-1.5 MG/5ML PO SYRP: 5.0000 mL | ORAL_SOLUTION | Freq: Three times a day (TID) | ORAL | 0 refills | Status: DC | PRN

## 2019-12-10 MED FILL — DOXYCYCLINE HYCLATE 100 MG: 100 | 10 days supply | Qty: 20 | Fill #0

## 2019-12-10 NOTE — Telephone Encounter (Signed)
Please advise she went to get tested today

## 2019-12-12 ENCOUNTER — Telehealth: Payer: Self-pay | Admitting: Adult Health

## 2019-12-12 LAB — NOVEL CORONAVIRUS, NAA: SARS-CoV-2, NAA: DETECTED — AB

## 2019-12-12 NOTE — Telephone Encounter (Signed)
Patient called about Positive Covid test.  2 patient identifiers confirmed.  Date Tested: 12/10/2019  Date of Symptom onset: 12/08/2019   Symptoms: Mild  Isolation Recommendations:  Patient understands the needs to stay in isolation for a total of 10 days from onset of symptom or 14 days total from date of testing if no symptom. Reviewed Masking.    Supportive Care Recommendations: Encouraged plenty of fluid intake, Tylenol per package directions, and to remain as active as possible.    Patient knows the health department may be in touch.    I answered all of patient's questions and all concerns addressed.  Er precautions reviewed.  Gave information on My chart.    Time Spent: 6 minutes  Wilber Bihari, NP

## 2019-12-24 ENCOUNTER — Encounter: Payer: Self-pay | Admitting: Family Medicine

## 2019-12-24 NOTE — Telephone Encounter (Signed)
Make her an appt to discuss options for treatment during recovery if she would like next week

## 2019-12-25 NOTE — Telephone Encounter (Signed)
LM for pt to call and schedule virtual appt with Dr. Charlett Blake

## 2019-12-29 ENCOUNTER — Other Ambulatory Visit: Payer: Self-pay

## 2019-12-29 ENCOUNTER — Ambulatory Visit (INDEPENDENT_AMBULATORY_CARE_PROVIDER_SITE_OTHER): Payer: BC Managed Care – PPO | Admitting: Family Medicine

## 2019-12-29 DIAGNOSIS — K219 Gastro-esophageal reflux disease without esophagitis: Secondary | ICD-10-CM

## 2019-12-29 DIAGNOSIS — U071 COVID-19: Secondary | ICD-10-CM | POA: Diagnosis not present

## 2019-12-29 MED ORDER — TOPIRAMATE 25 MG PO TABS: 25.0000 mg | ORAL_TABLET | Freq: Every day | ORAL | 1 refills | Status: DC

## 2019-12-29 MED ORDER — FAMOTIDINE 40 MG PO TABS: 40.0000 mg | ORAL_TABLET | Freq: Every day | ORAL | 5 refills | Status: DC

## 2019-12-29 MED ORDER — ALBUTEROL SULFATE HFA 108 (90 BASE) MCG/ACT IN AERS: 2.0000 | INHALATION_SPRAY | Freq: Four times a day (QID) | RESPIRATORY_TRACT | 3 refills | Status: DC | PRN

## 2019-12-29 MED ORDER — OMEPRAZOLE 20 MG PO CPDR: 20.0000 mg | DELAYED_RELEASE_CAPSULE | Freq: Every day | ORAL | 1 refills | Status: DC

## 2019-12-29 MED FILL — FAMOTIDINE 40 MG TABLET: 40 | 30 days supply | Qty: 30 | Fill #0

## 2019-12-29 MED FILL — VENTOLIN HFA 90 MCG INHALER: 108 (90 BAS | 25 days supply | Qty: 18 | Fill #0

## 2019-12-29 MED FILL — OMEPRAZOLE 20 MG CAP: 20 | 90 days supply | Qty: 90 | Fill #0

## 2019-12-29 MED FILL — TOPIRAMATE 25 MG TAB: 25 | 90 days supply | Qty: 90 | Fill #0

## 2019-12-30 NOTE — Assessment & Plan Note (Signed)
Encouraged increased rest and hydration, add probiotics, zinc such as Coldeze or Xicam. Treat fevers as needed. Encouraged deep breathing exercises. Also Multivitamin with minerals, vitamin D 2000 IU daily, probiotic daily. Melatonin 5 mg qhs, vitamin C 1000 mg bid and zinc 30 mg daily. Monitor oxygen levels and seek care if numbers drop into the 80s.

## 2019-12-30 NOTE — Progress Notes (Signed)
Virtual Visit via Video Note  I connected with Laura Bender on 12/29/19 at  9:00 AM EST by a video enabled telemedicine application and verified that I am speaking with the correct person using two identifiers.  Location: Patient: home Provider: office   I discussed the limitations of evaluation and management by telemedicine and the availability of in person appointments. The patient expressed understanding and agreed to proceed. Magdalene Molly, CMA was able to set her up on a video visit.    Subjective:    [name], female    DOB: 1956-06-01, 64 y.o.   MRN: [id number]  No chief complaint on file.   HPI Patient is in today for follow up on her diagnosis of [place]. She Is managing her symptoms but is still not fully recovered. She still struggles with fatigue, increased congestion, cough sinus and ear pressure. She lost her taste and smell but they have recovered about 70%. She notes fevers and chills have resolved. Her myalgias are also largely improved. Her sputum while still copious has gone from green to white. Denies CP/palp/HA/fevers/GI or GU c/o. Taking meds as prescribed  Past Medical History:  Diagnosis Date  . Advanced care planning/counseling discussion 09/01/2014  . Allergic state 04/17/2013  . Anxiety   . 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 Clotting disorder ([place])   . Depression 03-04-12   tx. meds  . Depression with anxiety 04/17/2013  . ETOH abuse   . Fibromyalgia 03-04-12   Sporadic pain  . GERD (gastroesophageal reflux disease) 08/17/2013  . History of transfusion of platelets X 1   "related to ITP"  . Hyperlipidemia, mixed 10/16/2015  . ITP (idiopathic thrombocytopenic purpura) 03-04-12   Dx.  '95- blow normal levels, but stable.  . Lipoma 03-04-12   Rt. back about scapular ares  . Migraine    "that's what the dr thinks I had yesterday; still unsure; never had one before" (06/19/2016)  . Overweight 03/06/2015  . PONV (postoperative  nausea and vomiting)   . Preventative health care 04/17/2013; 08/17/2013  . Solitary pulmonary nodule 08/17/2013   LLL seen on CT in March 2014    Past Surgical History:  Procedure Laterality Date  . APPENDECTOMY  1995  . BREAST BIOPSY Left 2012   needle biopsy(benign)-Titanium needle marker remains  . CHOLECYSTECTOMY N/A 04/24/2013   Procedure: LAPAROSCOPIC CHOLECYSTECTOMY WITH INTRAOPERATIVE CHOLANGIOGRAM;  Surgeon: Haywood Lasso, MD;  Location: [facility];  Service: General;  Laterality: N/A;  . COLONOSCOPY  2008   WNL, Dr Cristina Gong  . IRRIGATION AND DEBRIDEMENT ABSCESS  ~ 2005 X 2   buttocks  . LIPOMA EXCISION  ~ 04/2016   "base of my neck"  . MASS EXCISION  03/10/2012   Procedure: EXCISION MASS;  Surgeon: Odis Hollingshead, MD;  Location: [facility];  Service: General;  Laterality: N/A;  Removal of back lipoma  . 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 SPLENECTOMY, TOTAL  1995  . TOTAL ABDOMINAL HYSTERECTOMY  1995   total, endometriosis, fibroid, ovarian atrophy    Family History  Problem Relation Age of Onset  . Heart disease Maternal Grandmother   . Heart disease Maternal Grandfather   . Heart attack Maternal Grandfather 62  . Heart disease Paternal Grandmother   . Heart attack Paternal Grandmother 86  . Heart disease Paternal Grandfather   . Cancer Paternal Grandfather        chest- smoker  . Diabetes Mother  type 2- controlled by diet  . Atrial fibrillation Father   . Hyperlipidemia Sister   . Migraines Sister   . Cancer Sister        pancreatic  . Depression Sister   . COPD Sister     Social History   Socioeconomic History  . Marital status: Single    Spouse name: Not on file  . Number of children: 0  . Years of education: 34  . Highest education level: Not on file  Occupational History  . Not on file  Tobacco Use  . Smoking status: Former Smoker    Packs/day: 0.75    Years: 5.00    Pack years: 3.75    Types: Cigarettes    Quit date:  02/21/1983    Years since quitting: 36.8  . Smokeless tobacco: Never Used  Substance and Sexual Activity  . Alcohol use: Yes    Alcohol/week: 12.0 standard drinks    Types: 12 Cans of beer per week  . Drug use: No  . 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 lives with partner   Other Topics Concern  . Not on file  Social History Narrative   Lives at home w/ her roommate   Right-handed   Caffeine: has cut down to 1-2 cups 1/2 caf coffee in the a.m.   Social Determinants of Health   Financial Resource Strain:   . Difficulty of Paying Living Expenses: Not on file  Food Insecurity:   . Worried About Charity fundraiser in the Last Year: Not on file  . Ran Out of Food in the Last Year: Not on file  Transportation Needs:   . Lack of Transportation (Medical): Not on file  . Lack of Transportation (Non-Medical): Not on file  Physical Activity:   . Days of Exercise per Week: Not on file  . Minutes of Exercise per Session: Not on file  Stress:   . Feeling of Stress : Not on file  Social Connections:   . Frequency of Communication with Friends and Family: Not on file  . Frequency of Social Gatherings with Friends and Family: Not on file  . Attends Religious Services: Not on file  . Active Member of Clubs or Organizations: Not on file  . Attends Archivist Meetings: Not on file  . Marital Status: Not on file  Intimate Partner Violence:   . Fear of Current or Ex-Partner: Not on file  . Emotionally Abused: Not on file  . Physically Abused: Not on file  . Sexually Abused: Not on file    Outpatient Medications Prior to Visit  Medication Sig Dispense Refill  . acetaminophen (TYLENOL) 500 MG tablet Take 1,000 mg by mouth every 6 (six) hours as needed for pain.    . Calcium Carb-Cholecalciferol (CALCIUM 600 + D PO) Take 1 tablet by mouth daily.    . Cholecalciferol (VITAMIN D) 2000 units CAPS Take 2,000  Units by mouth daily.    . clorazepate (TRANXENE) 7.5 MG tablet TAKE 1 TABLET BY MOUTH EVERY NIGHT AT BEDTIME 90 tablet 0  . docusate sodium (COLACE) 100 MG capsule Take 100 mg by mouth daily.    Marland Kitchen FLUoxetine (PROZAC) 20 MG capsule TAKE 2 CAPSULES BY MOUTH DAILY 180 capsule 1  . fluticasone (FLONASE) 50 MCG/ACT nasal spray Place 2 sprays into both nostrils daily. 16 g 1  . fluticasone (FLOVENT HFA) 110 MCG/ACT inhaler Inhale 2 puffs  into the lungs 2 (two) times daily. 1 Inhaler 12  . HYDROcodone-homatropine (HYCODAN) 5-1.5 MG/5ML syrup Take 5 mLs by mouth every 8 (eight) hours as needed for cough. 120 mL 0  . hydrocortisone (ANUSOL-HC) 25 MG suppository Place 1 suppository (25 mg total) rectally 2 (two) times daily. 12 suppository 2  . loratadine (CLARITIN) 10 MG tablet Take 1 tablet (10 mg total) by mouth daily. 30 tablet 11  . traZODone (DESYREL) 50 MG tablet TAKE 1 & 1/2 TABLETS BY MOUTH AT BEDTIME 135 tablet 1  . albuterol (PROVENTIL HFA;VENTOLIN HFA) 108 (90 Base) MCG/ACT inhaler Inhale 2 puffs into the lungs every 6 (six) hours as needed for wheezing or shortness of breath. 1 Inhaler 2  . doxycycline (VIBRA-TABS) 100 MG tablet Take 1 tablet (100 mg total) by mouth 2 (two) times daily. 20 tablet 0  . omeprazole (PRILOSEC) 20 MG capsule TAKE ONE CAPSULE BY MOUTH DAILY 90 capsule 1  . topiramate (TOPAMAX) 25 MG tablet TAKE 1 TABLET (25 MG TOTAL) BY MOUTH DAILY. 90 tablet 1   No facility-administered medications prior to visit.    Allergies  Allergen Reactions  . Codeine Nausea And Vomiting    Review of Systems  Constitutional: Positive for malaise/fatigue. Negative for fever.  HENT: Positive for congestion and sinus pain.   Eyes: Negative for blurred vision.  Respiratory: Positive for cough, sputum production and shortness of breath.   Cardiovascular: Negative for chest pain, palpitations and leg swelling.  Gastrointestinal: Negative for abdominal pain, blood in stool and nausea.    Genitourinary: Negative for dysuria and frequency.  Musculoskeletal: Positive for myalgias. Negative for falls.  Skin: Negative for rash.  Neurological: Negative for dizziness, loss of consciousness and headaches.  Endo/Heme/Allergies: Negative for environmental allergies.  Psychiatric/Behavioral: Negative for depression. The patient is nervous/anxious.        Objective:    Physical Exam Constitutional:      Appearance: Normal appearance. She is not ill-appearing.  HENT:     Head: Normocephalic and atraumatic.  Eyes:     General:        Right eye: No discharge.        Left eye: No discharge.  Pulmonary:     Effort: Pulmonary effort is normal.  Neurological:     Mental Status: She is alert and oriented to person, place, and time.  Psychiatric:        Behavior: Behavior normal.     There were no vitals taken for this visit. Wt Readings from Last 3 Encounters:  07/09/19 192 lb (87.1 kg)  12/05/18 187 lb 6.3 oz (85 kg)  12/04/18 187 lb 12.8 oz (85.2 kg)    Diabetic Foot Exam - Simple   No data filed     Lab Results  Component Value Date   WBC 6.1 07/16/2019   HGB 13.9 07/16/2019   HCT 41.7 07/16/2019   PLT 72.0 (L) 07/16/2019   GLUCOSE 92 07/16/2019   CHOL 201 (H) 07/16/2019   TRIG 178.0 (H) 07/16/2019   HDL 56.40 07/16/2019   LDLDIRECT 110.0 11/05/2017   LDLCALC 109 (H) 07/16/2019   ALT 13 07/16/2019   AST 19 07/16/2019   NA 137 07/16/2019   K 4.3 07/16/2019   CL 103 07/16/2019   CREATININE 0.93 07/16/2019   BUN 19 07/16/2019   CO2 28 07/16/2019   TSH 1.69 07/16/2019   INR 0.89 05/14/2018   HGBA1C 4.8 06/19/2016    Lab Results  Component Value Date  TSH 1.69 07/16/2019   Lab Results  Component Value Date   WBC 6.1 07/16/2019   HGB 13.9 07/16/2019   HCT 41.7 07/16/2019   MCV 95.4 07/16/2019   PLT 72.0 (L) 07/16/2019   Lab Results  Component Value Date   NA 137 07/16/2019   K 4.3 07/16/2019   CO2 28 07/16/2019   GLUCOSE 92 07/16/2019    BUN 19 07/16/2019   CREATININE 0.93 07/16/2019   BILITOT 0.5 07/16/2019   ALKPHOS 105 07/16/2019   AST 19 07/16/2019   ALT 13 07/16/2019   PROT 7.7 07/16/2019   ALBUMIN 4.4 07/16/2019   CALCIUM 9.8 07/16/2019   ANIONGAP 7 06/19/2016   GFR 60.98 07/16/2019   Lab Results  Component Value Date   CHOL 201 (H) 07/16/2019   Lab Results  Component Value Date   HDL 56.40 07/16/2019   Lab Results  Component Value Date   LDLCALC 109 (H) 07/16/2019   Lab Results  Component Value Date   TRIG 178.0 (H) 07/16/2019   Lab Results  Component Value Date   CHOLHDL 4 07/16/2019   Lab Results  Component Value Date   HGBA1C 4.8 06/19/2016       Assessment & Plan:   Problem List Items Addressed This Visit    GERD (gastroesophageal reflux disease)    Avoid offending foods, start probiotics. Do not eat large meals in late evening and consider raising head of bed.       Relevant Medications   famotidine (PEPCID) 40 MG tablet   omeprazole (PRILOSEC) 20 MG capsule   COVID-19    Encouraged increased rest and hydration, add probiotics, zinc such as Coldeze or Xicam. Treat fevers as needed. Encouraged deep breathing exercises. Also Multivitamin with minerals, vitamin D 2000 IU daily, probiotic daily. Melatonin 5 mg qhs, vitamin C 1000 mg bid and zinc 30 mg daily. Monitor oxygen levels and seek care if numbers drop into the 80s.         I have discontinued Redge Gainer "Pam"'s doxycycline. I have also changed her albuterol and omeprazole. Additionally, I am having her start on famotidine. Lastly, I am having her maintain her acetaminophen, Calcium Carb-Cholecalciferol (CALCIUM 600 + D PO), docusate sodium, Vitamin D, hydrocortisone, loratadine, fluticasone, fluticasone, FLUoxetine, traZODone, clorazepate, HYDROcodone-homatropine, and topiramate.  Meds ordered this encounter  Medications  . famotidine (PEPCID) 40 MG tablet    Sig: Take 1 tablet (40 mg total) by mouth at bedtime.     Dispense:  30 tablet    Refill:  5  . albuterol (VENTOLIN HFA) 108 (90 Base) MCG/ACT inhaler    Sig: Inhale 2 puffs into the lungs every 6 (six) hours as needed for wheezing or shortness of breath.    Dispense:  18 g    Refill:  3  . omeprazole (PRILOSEC) 20 MG capsule    Sig: Take 1 capsule (20 mg total) by mouth daily.    Dispense:  90 capsule    Refill:  1  . topiramate (TOPAMAX) 25 MG tablet    Sig: Take 1 tablet (25 mg total) by mouth daily.    Dispense:  90 tablet    Refill:  1      I discussed the assessment and treatment plan with the patient. The patient was provided an opportunity to ask questions and all were answered. The patient agreed with the plan and demonstrated an understanding of the instructions.   The patient was advised to call back or seek  an in-person evaluation if the symptoms worsen or if the condition fails to improve as anticipated.  I provided 15 minutes of non-face-to-face time during this encounter.   Penni Homans, MD

## 2019-12-30 NOTE — Assessment & Plan Note (Signed)
Avoid offending foods, start probiotics. Do not eat large meals in late evening and consider raising head of bed.  

## 2020-01-01 MED FILL — FLUoxetine HCL 20 MG CAPS: 20 | 90 days supply | Qty: 180 | Fill #1

## 2020-01-18 ENCOUNTER — Other Ambulatory Visit: Payer: Self-pay

## 2020-01-19 ENCOUNTER — Ambulatory Visit (INDEPENDENT_AMBULATORY_CARE_PROVIDER_SITE_OTHER): Payer: BC Managed Care – PPO | Admitting: Family Medicine

## 2020-01-19 ENCOUNTER — Ambulatory Visit (HOSPITAL_BASED_OUTPATIENT_CLINIC_OR_DEPARTMENT_OTHER)
Admission: RE | Admit: 2020-01-19 | Discharge: 2020-01-19 | Disposition: A | Discharge status: Home / Self Care | Payer: BC Managed Care – PPO | Source: Ambulatory Visit | Attending: Family Medicine | Admitting: Family Medicine

## 2020-01-19 ENCOUNTER — Telehealth: Payer: Self-pay | Admitting: *Deleted

## 2020-01-19 VITALS — BP 118/60 | HR 80 | Temp 98.0°F | Resp 16 | Ht 68.0 in | Wt 190.0 lb

## 2020-01-19 DIAGNOSIS — D693 Immune thrombocytopenic purpura: Secondary | ICD-10-CM

## 2020-01-19 DIAGNOSIS — J40 Bronchitis, not specified as acute or chronic: Secondary | ICD-10-CM

## 2020-01-19 DIAGNOSIS — U071 COVID-19: Secondary | ICD-10-CM | POA: Diagnosis not present

## 2020-01-19 DIAGNOSIS — R079 Chest pain, unspecified: Secondary | ICD-10-CM | POA: Diagnosis not present

## 2020-01-19 DIAGNOSIS — Z1239 Encounter for other screening for malignant neoplasm of breast: Secondary | ICD-10-CM

## 2020-01-19 DIAGNOSIS — D696 Thrombocytopenia, unspecified: Secondary | ICD-10-CM | POA: Diagnosis not present

## 2020-01-19 DIAGNOSIS — G4700 Insomnia, unspecified: Secondary | ICD-10-CM

## 2020-01-19 DIAGNOSIS — E782 Mixed hyperlipidemia: Secondary | ICD-10-CM

## 2020-01-19 DIAGNOSIS — R0989 Other specified symptoms and signs involving the circulatory and respiratory systems: Secondary | ICD-10-CM

## 2020-01-19 LAB — CBC WITH DIFFERENTIAL/PLATELET
Basophils Absolute: 0.1 10*3/uL (ref 0.0–0.1)
Basophils Relative: 2.2 % (ref 0.0–3.0)
Eosinophils Absolute: 0.1 10*3/uL (ref 0.0–0.7)
Eosinophils Relative: 2.1 % (ref 0.0–5.0)
HCT: 39.7 % (ref 36.0–46.0)
Hemoglobin: 13 g/dL (ref 12.0–15.0)
Lymphocytes Relative: 41.5 % (ref 12.0–46.0)
Lymphs Abs: 2.3 10*3/uL (ref 0.7–4.0)
MCHC: 32.8 g/dL (ref 30.0–36.0)
MCV: 96.2 fl (ref 78.0–100.0)
Monocytes Absolute: 0.8 10*3/uL (ref 0.1–1.0)
Monocytes Relative: 13.5 % — ABNORMAL HIGH (ref 3.0–12.0)
Neutro Abs: 2.3 10*3/uL (ref 1.4–7.7)
Neutrophils Relative %: 40.7 % — ABNORMAL LOW (ref 43.0–77.0)
Platelets: 47 10*3/uL — CL (ref 150.0–400.0)
RBC: 4.13 Mil/uL (ref 3.87–5.11)
RDW: 13.2 % (ref 11.5–15.5)
WBC: 5.6 10*3/uL (ref 4.0–10.5)

## 2020-01-19 LAB — COMPREHENSIVE METABOLIC PANEL
ALT: 14 U/L (ref 0–35)
AST: 17 U/L (ref 0–37)
Albumin: 4.2 g/dL (ref 3.5–5.2)
Alkaline Phosphatase: 89 U/L (ref 39–117)
BUN: 23 mg/dL (ref 6–23)
CO2: 30 mEq/L (ref 19–32)
Calcium: 9.6 mg/dL (ref 8.4–10.5)
Chloride: 103 mEq/L (ref 96–112)
Creatinine, Ser: 0.85 mg/dL (ref 0.40–1.20)
GFR: 67.54 mL/min (ref >60.00)
Glucose, Bld: 95 mg/dL (ref 70–99)
Potassium: 4.3 mEq/L (ref 3.5–5.1)
Sodium: 136 mEq/L (ref 135–145)
Total Bilirubin: 0.4 mg/dL (ref 0.2–1.2)
Total Protein: 7.6 g/dL (ref 6.0–8.3)

## 2020-01-19 LAB — LIPID PANEL
Cholesterol: 222 mg/dL — ABNORMAL HIGH (ref 0–200)
HDL: 57.1 mg/dL (ref >39.00)
NonHDL: 164.7
Total CHOL/HDL Ratio: 4
Triglycerides: 241 mg/dL — ABNORMAL HIGH (ref 0.0–149.0)
VLDL: 48.2 mg/dL — ABNORMAL HIGH (ref 0.0–40.0)

## 2020-01-19 LAB — LDL CHOLESTEROL, DIRECT: Direct LDL: 114 mg/dL

## 2020-01-19 LAB — TSH: TSH: 1.92 u[IU]/mL (ref 0.35–4.50)

## 2020-01-19 MED ORDER — DOXYCYCLINE HYCLATE 100 MG PO TABS: 100.0000 mg | ORAL_TABLET | Freq: Two times a day (BID) | ORAL | 0 refills | Status: DC

## 2020-01-19 MED FILL — DOXYCYCLINE HYCLATE 100 MG: 100 | 7 days supply | Qty: 14 | Fill #0

## 2020-01-19 NOTE — Assessment & Plan Note (Signed)
Encouraged heart healthy diet, increase exercise, avoid trans fats, consider a krill oil cap daily, encouraged to consider starting Atorvastatin 10 mg qhs

## 2020-01-19 NOTE — Progress Notes (Signed)
Subjective:    [name], female    DOB: [birth year], 64 y.o.   MRN: [id number]  Chief Complaint  Patient presents with  . Chest disconfort    complains of disconfort with breathing since having Covid19    HPI Patient is in today for follow up on chronic medical concerns. She was diagnosed with COVID in January and had been improving but lately she has noted increased chest tightness and discomfort, wose with a deep breath. She notes increased chest congestion, PND and cough productive of white sputum. She also notes fatigue but denies any fevers or chills. She initially lost her taste and smell but those are back. Denies palp/HA/fevers/GI or GU c/o. Taking meds as prescribed  Past Medical History:  Diagnosis Date  . Advanced care planning/counseling discussion 09/01/2014  . Allergic state 04/17/2013  . Anxiety   . 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 Clotting disorder ([place])   . Depression 03-04-12   tx. meds  . Depression with anxiety 04/17/2013  . ETOH abuse   . Fibromyalgia 03-04-12   Sporadic pain  . GERD (gastroesophageal reflux disease) 08/17/2013  . History of transfusion of platelets X 1   "related to ITP"  . Hyperlipidemia, mixed 10/16/2015  . ITP (idiopathic thrombocytopenic purpura) 03-04-12   Dx.  '95- blow normal levels, but stable.  . Lipoma 03-04-12   Rt. back about scapular ares  . Migraine    "that's what the dr thinks I had yesterday; still unsure; never had one before" (06/19/2016)  . Overweight 03/06/2015  . PONV (postoperative nausea and vomiting)   . Preventative health care 04/17/2013; 08/17/2013  . Solitary pulmonary nodule 08/17/2013   LLL seen on CT in March 2014    Past Surgical History:  Procedure Laterality Date  . APPENDECTOMY  1995  . BREAST BIOPSY Left 2012   needle biopsy(benign)-Titanium needle marker remains  . CHOLECYSTECTOMY N/A 04/24/2013   Procedure: LAPAROSCOPIC CHOLECYSTECTOMY WITH INTRAOPERATIVE CHOLANGIOGRAM;   Surgeon: Haywood Lasso, MD;  Location: [facility];  Service: General;  Laterality: N/A;  . COLONOSCOPY  2008   WNL, Dr Cristina Gong  . IRRIGATION AND DEBRIDEMENT ABSCESS  ~ 2005 X 2   buttocks  . LIPOMA EXCISION  ~ 04/2016   "base of my neck"  . MASS EXCISION  03/10/2012   Procedure: EXCISION MASS;  Surgeon: Odis Hollingshead, MD;  Location: [facility];  Service: General;  Laterality: N/A;  Removal of back lipoma  . 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 SPLENECTOMY, TOTAL  1995  . TOTAL ABDOMINAL HYSTERECTOMY  1995   total, endometriosis, fibroid, ovarian atrophy    Family History  Problem Relation Age of Onset  . Heart disease Maternal Grandmother   . Heart disease Maternal Grandfather   . Heart attack Maternal Grandfather 62  . Heart disease Paternal Grandmother   . Heart attack Paternal Grandmother 86  . Heart disease Paternal Grandfather   . Cancer Paternal Grandfather        chest- smoker  . Diabetes Mother        type 2- controlled by diet  . Atrial fibrillation Father   . Hyperlipidemia Sister   . Migraines Sister   . Cancer Sister        pancreatic  . Depression Sister   . COPD Sister     Social History   Socioeconomic History  . Marital status: Single    Spouse name: Not  on file  . Number of children: 0  . Years of education: 39  . Highest education level: Not on file  Occupational History  . Not on file  Tobacco Use  . Smoking status: Former Smoker    Packs/day: 0.75    Years: 5.00    Pack years: 3.75    Types: Cigarettes    Quit date: 02/21/1983    Years since quitting: 36.9  . Smokeless tobacco: Never Used  Substance and Sexual Activity  . Alcohol use: Yes    Alcohol/week: 12.0 standard drinks    Types: 12 Cans of beer per week  . Drug use: No  . 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 lives with partner   Other Topics Concern  . Not on file  Social History Narrative     Lives at home w/ her roommate   Right-handed   Caffeine: has cut down to 1-2 cups 1/2 caf coffee in the a.m.   Social Determinants of Health   Financial Resource Strain:   . Difficulty of Paying Living Expenses: Not on file  Food Insecurity:   . Worried About Charity fundraiser in the Last Year: Not on file  . Ran Out of Food in the Last Year: Not on file  Transportation Needs:   . Lack of Transportation (Medical): Not on file  . Lack of Transportation (Non-Medical): Not on file  Physical Activity:   . Days of Exercise per Week: Not on file  . Minutes of Exercise per Session: Not on file  Stress:   . Feeling of Stress : Not on file  Social Connections:   . Frequency of Communication with Friends and Family: Not on file  . Frequency of Social Gatherings with Friends and Family: Not on file  . Attends Religious Services: Not on file  . Active Member of Clubs or Organizations: Not on file  . Attends Archivist Meetings: Not on file  . Marital Status: Not on file  Intimate Partner Violence:   . Fear of Current or Ex-Partner: Not on file  . Emotionally Abused: Not on file  . Physically Abused: Not on file  . Sexually Abused: Not on file    Outpatient Medications Prior to Visit  Medication Sig Dispense Refill  . acetaminophen (TYLENOL) 500 MG tablet Take 1,000 mg by mouth every 6 (six) hours as needed for pain.    . Calcium Carb-Cholecalciferol (CALCIUM 600 + D PO) Take 1 tablet by mouth daily.    . Cholecalciferol (VITAMIN D) 2000 units CAPS Take 2,000 Units by mouth daily.    . clorazepate (TRANXENE) 7.5 MG tablet TAKE 1 TABLET BY MOUTH EVERY NIGHT AT BEDTIME 90 tablet 0  . docusate sodium (COLACE) 100 MG capsule Take 100 mg by mouth daily.    . famotidine (PEPCID) 40 MG tablet Take 1 tablet (40 mg total) by mouth at bedtime. 30 tablet 5  . FLUoxetine (PROZAC) 20 MG capsule TAKE 2 CAPSULES BY MOUTH DAILY 180 capsule 1  . HYDROcodone-homatropine (HYCODAN) 5-1.5  MG/5ML syrup Take 5 mLs by mouth every 8 (eight) hours as needed for cough. 120 mL 0  . omeprazole (PRILOSEC) 20 MG capsule Take 1 capsule (20 mg total) by mouth daily. 90 capsule 1  . topiramate (TOPAMAX) 25 MG tablet Take 1 tablet (25 mg total) by mouth daily. 90 tablet 1  . traZODone (DESYREL) 50 MG tablet TAKE 1 &  1/2 TABLETS BY MOUTH AT BEDTIME 135 tablet 1  . albuterol (VENTOLIN HFA) 108 (90 Base) MCG/ACT inhaler Inhale 2 puffs into the lungs every 6 (six) hours as needed for wheezing or shortness of breath. 18 g 3  . fluticasone (FLONASE) 50 MCG/ACT nasal spray Place 2 sprays into both nostrils daily. 16 g 1  . fluticasone (FLOVENT HFA) 110 MCG/ACT inhaler Inhale 2 puffs into the lungs 2 (two) times daily. 1 Inhaler 12  . hydrocortisone (ANUSOL-HC) 25 MG suppository Place 1 suppository (25 mg total) rectally 2 (two) times daily. 12 suppository 2  . loratadine (CLARITIN) 10 MG tablet Take 1 tablet (10 mg total) by mouth daily. 30 tablet 11   No facility-administered medications prior to visit.    Allergies  Allergen Reactions  . Codeine Nausea And Vomiting    Review of Systems  Constitutional: Positive for malaise/fatigue. Negative for fever.  HENT: Positive for congestion.   Eyes: Negative for blurred vision.  Respiratory: Positive for cough, sputum production and shortness of breath.   Cardiovascular: Positive for chest pain. Negative for palpitations and leg swelling.  Gastrointestinal: Negative for abdominal pain, blood in stool and nausea.  Genitourinary: Negative for dysuria and frequency.  Musculoskeletal: Negative for falls.  Skin: Negative for rash.  Neurological: Negative for dizziness, loss of consciousness and headaches.  Endo/Heme/Allergies: Negative for environmental allergies.  Psychiatric/Behavioral: Negative for depression. The patient is not nervous/anxious.        Objective:    Physical Exam Vitals and nursing note reviewed.  Constitutional:       General: She is not in acute distress.    Appearance: She is well-developed.  HENT:     Head: Normocephalic and atraumatic.     Nose: Nose normal.  Eyes:     General:        Right eye: No discharge.        Left eye: No discharge.  Cardiovascular:     Rate and Rhythm: Normal rate and regular rhythm.     Heart sounds: No murmur.  Pulmonary:     Effort: Pulmonary effort is normal.     Breath sounds: Normal breath sounds.     Comments: Decreased breath sounds LLL Abdominal:     General: Bowel sounds are normal.     Palpations: Abdomen is soft.     Tenderness: There is no abdominal tenderness.  Musculoskeletal:     Cervical back: Normal range of motion and neck supple.  Skin:    General: Skin is warm and dry.  Neurological:     Mental Status: She is alert and oriented to person, place, and time.     BP 118/60 (BP Location: Left Arm, Patient Position: Sitting, Cuff Size: Small)   Pulse 80   Temp 98 F (36.7 C) (Oral)   Resp 16   Ht 5\' 8"  (1.727 m)   Wt 190 lb (86.2 kg)   SpO2 100%   BMI 28.89 kg/m  Wt Readings from Last 3 Encounters:  01/19/20 190 lb (86.2 kg)  07/09/19 192 lb (87.1 kg)  12/05/18 187 lb 6.3 oz (85 kg)    Diabetic Foot Exam - Simple   No data filed     Lab Results  Component Value Date   WBC 5.6 01/19/2020   HGB 13.0 01/19/2020   HCT 39.7 01/19/2020   PLT 47.0 Repeated and verified X2. (LL) 01/19/2020   GLUCOSE 95 01/19/2020   CHOL 222 (H) 01/19/2020   TRIG 241.0 (H) 01/19/2020  HDL 57.10 01/19/2020   LDLDIRECT 114.0 01/19/2020   LDLCALC 109 (H) 07/16/2019   ALT 14 01/19/2020   AST 17 01/19/2020   NA 136 01/19/2020   K 4.3 01/19/2020   CL 103 01/19/2020   CREATININE 0.85 01/19/2020   BUN 23 01/19/2020   CO2 30 01/19/2020   TSH 1.92 01/19/2020   INR 0.89 05/14/2018   HGBA1C 4.8 06/19/2016    Lab Results  Component Value Date   TSH 1.92 01/19/2020   Lab Results  Component Value Date   WBC 5.6 01/19/2020   HGB 13.0 01/19/2020    HCT 39.7 01/19/2020   MCV 96.2 01/19/2020   PLT 47.0 Repeated and verified X2. (LL) 01/19/2020   Lab Results  Component Value Date   NA 136 01/19/2020   K 4.3 01/19/2020   CO2 30 01/19/2020   GLUCOSE 95 01/19/2020   BUN 23 01/19/2020   CREATININE 0.85 01/19/2020   BILITOT 0.4 01/19/2020   ALKPHOS 89 01/19/2020   AST 17 01/19/2020   ALT 14 01/19/2020   PROT 7.6 01/19/2020   ALBUMIN 4.2 01/19/2020   CALCIUM 9.6 01/19/2020   ANIONGAP 7 06/19/2016   GFR 67.54 01/19/2020   Lab Results  Component Value Date   CHOL 222 (H) 01/19/2020   Lab Results  Component Value Date   HDL 57.10 01/19/2020   Lab Results  Component Value Date   LDLCALC 109 (H) 07/16/2019   Lab Results  Component Value Date   TRIG 241.0 (H) 01/19/2020   Lab Results  Component Value Date   CHOLHDL 4 01/19/2020   Lab Results  Component Value Date   HGBA1C 4.8 06/19/2016       Assessment & Plan:   Problem List Items Addressed This Visit    Idiopathic thrombocytopenic purpura ([place]) (Chronic)    Asymptomatic and following with hematology. CBC run today      Hyperlipidemia, mixed    Encouraged heart healthy diet, increase exercise, avoid trans fats, consider a krill oil cap daily, encouraged to consider starting Atorvastatin 10 mg qhs      Relevant Orders   Lipid panel (Completed)   TSH (Completed)   COVID-19    Was diagnosed in January and she did feel she was improving but she is now noting increased chest tightness and congestion with cough productive of white phlegm, fatigue, chest discomfort worse with deep breaths. Has also noted increased PND productive of white phlegm.       Insomnia    Encouraged good sleep hygiene such as dark, quiet room. No blue/green glowing lights such as computer screens in bedroom. No alcohol or stimulants in evening. Cut down on caffeine as able. Regular exercise is helpful but not just prior to bed time. Consider Melatonin      Bronchitis    chest tightness  and congestion with cough productive of white phlegm, fatigue, chest discomfort worse with deep breaths. Has also noted increased PND productive of white phlegm. Started on Doxycycline, mucinex and probiotics       Other Visit Diagnoses    Encounter for screening for malignant neoplasm of breast, unspecified screening modality    -  Primary   Relevant Orders   MM 3D SCREEN BREAST BILATERAL   Thrombocytopenia ([place])       Relevant Orders   CBC with Differential/Platelet (Completed)   Chest congestion       Relevant Orders   Comprehensive metabolic panel (Completed)   TSH (Completed)   DG Chest  2 View (Completed)      I have discontinued Redge Gainer "Pam"'s hydrocortisone, loratadine, fluticasone, fluticasone, and albuterol. I am also having her start on doxycycline. Additionally, I am having her maintain her acetaminophen, Calcium Carb-Cholecalciferol (CALCIUM 600 + D PO), docusate sodium, Vitamin D, FLUoxetine, traZODone, clorazepate, HYDROcodone-homatropine, famotidine, omeprazole, and topiramate.  Meds ordered this encounter  Medications  . doxycycline (VIBRA-TABS) 100 MG tablet    Sig: Take 1 tablet (100 mg total) by mouth 2 (two) times daily.    Dispense:  14 tablet    Refill:  0     Penni Homans, MD

## 2020-01-19 NOTE — Assessment & Plan Note (Signed)
Encouraged good sleep hygiene such as dark, quiet room. No blue/green glowing lights such as computer screens in bedroom. No alcohol or stimulants in evening. Cut down on caffeine as able. Regular exercise is helpful but not just prior to bed time. Consider Melatonin

## 2020-01-19 NOTE — Assessment & Plan Note (Signed)
chest tightness and congestion with cough productive of white phlegm, fatigue, chest discomfort worse with deep breaths. Has also noted increased PND productive of white phlegm. Started on Doxycycline, mucinex and probiotics

## 2020-01-19 NOTE — Patient Instructions (Signed)
[facility] Blood Pressure cuff, upper arm, want BP 100-140/60-90 Pulse oximeter, want oxygen in 90s  Weekly vitals  Take Multivitamin with minerals, selenium Vitamin D 1000-2000 IU daily Probiotic with lactobacillus and bifidophilus Asprin EC 81 mg daily  Melatonin 2-5 mg at bedtime  [email or web address] 

## 2020-01-19 NOTE — Assessment & Plan Note (Signed)
Was diagnosed in January and she did feel she was improving but she is now noting increased chest tightness and congestion with cough productive of white phlegm, fatigue, chest discomfort worse with deep breaths. Has also noted increased PND productive of white phlegm.

## 2020-01-19 NOTE — Assessment & Plan Note (Signed)
Asymptomatic and following with hematology. CBC run today

## 2020-01-19 NOTE — Telephone Encounter (Signed)
Patient established with hematology, this is stable for patient

## 2020-01-19 NOTE — Telephone Encounter (Signed)
CRITICAL VALUE STICKER  CRITICAL VALUE:  Platelet  47,000  RECEIVER (on-site recipient of call): Kelle Darting, [place] NOTIFIED:  01/19/20 @ 4:18pm  MESSENGER (representative from lab):  Shari Prows.  MD NOTIFIED:  Charlett Blake / [place]: 4:18pm  RESPONSE:

## 2020-01-20 ENCOUNTER — Telehealth: Payer: Self-pay

## 2020-01-20 ENCOUNTER — Encounter: Payer: Self-pay | Admitting: Family Medicine

## 2020-01-20 MED ORDER — ATORVASTATIN CALCIUM 10 MG PO TABS: 10.0000 mg | ORAL_TABLET | Freq: Every day | ORAL | 3 refills | Status: DC

## 2020-01-20 MED FILL — ATORVASTATIN 10 MG TABLET: 10 | 30 days supply | Qty: 30 | Fill #0

## 2020-01-20 NOTE — Telephone Encounter (Signed)
Pt viewed via her MyChart/sent in Atorvastatin per SB/pt has visit scheduled for May/thx dmf

## 2020-01-20 NOTE — Telephone Encounter (Signed)
-----   Message from [name], MD sent at 01/19/2020  6:10 PM EST ----- Labs look mostly good. Platelets are low as expected. Cholesterol is up some more. It is up more than usual would like her to consider a low dose statin to get the numbers down. Atorvastatin 10 mg po qhs, disp #30 with 3 rf if she is willing. Recheck labs in 3 months

## 2020-01-21 ENCOUNTER — Ambulatory Visit (HOSPITAL_BASED_OUTPATIENT_CLINIC_OR_DEPARTMENT_OTHER): Payer: BC Managed Care – PPO

## 2020-01-28 ENCOUNTER — Other Ambulatory Visit: Payer: Self-pay

## 2020-01-28 ENCOUNTER — Ambulatory Visit (HOSPITAL_BASED_OUTPATIENT_CLINIC_OR_DEPARTMENT_OTHER)
Admission: RE | Admit: 2020-01-28 | Discharge: 2020-01-28 | Disposition: A | Discharge status: Home / Self Care | Payer: BC Managed Care – PPO | Source: Ambulatory Visit | Attending: Family Medicine | Admitting: Family Medicine

## 2020-01-28 DIAGNOSIS — Z1231 Encounter for screening mammogram for malignant neoplasm of breast: Secondary | ICD-10-CM | POA: Diagnosis not present

## 2020-01-28 DIAGNOSIS — Z1239 Encounter for other screening for malignant neoplasm of breast: Secondary | ICD-10-CM

## 2020-02-15 MED FILL — ATORVASTATIN 10 MG TABLET: 10 | 30 days supply | Qty: 30 | Fill #1

## 2020-02-29 MED FILL — traZODone HCL 50 MG TABS: 50 | 90 days supply | Qty: 135 | Fill #1

## 2020-03-30 ENCOUNTER — Other Ambulatory Visit: Payer: Self-pay | Admitting: Family Medicine

## 2020-03-30 MED FILL — TOPIRAMATE 25 MG TAB: 25 | 90 days supply | Qty: 90 | Fill #1

## 2020-03-30 MED FILL — FLUoxetine HCL 20 MG CAPS: 20 | 90 days supply | Qty: 180 | Fill #0

## 2020-04-19 ENCOUNTER — Other Ambulatory Visit: Payer: Self-pay

## 2020-04-19 ENCOUNTER — Telehealth (INDEPENDENT_AMBULATORY_CARE_PROVIDER_SITE_OTHER): Payer: BC Managed Care – PPO | Admitting: Family Medicine

## 2020-04-19 DIAGNOSIS — U071 COVID-19: Secondary | ICD-10-CM

## 2020-04-19 DIAGNOSIS — T7840XA Allergy, unspecified, initial encounter: Secondary | ICD-10-CM

## 2020-04-19 DIAGNOSIS — D693 Immune thrombocytopenic purpura: Secondary | ICD-10-CM | POA: Diagnosis not present

## 2020-04-19 DIAGNOSIS — R42 Dizziness and giddiness: Secondary | ICD-10-CM

## 2020-04-19 MED ORDER — FLUTICASONE PROPIONATE 50 MCG/ACT NA SUSP: 2.0000 | Freq: Every day | NASAL | 3 refills | Status: DC

## 2020-04-19 MED ORDER — CETIRIZINE HCL 10 MG PO TABS: 10.0000 mg | ORAL_TABLET | Freq: Two times a day (BID) | ORAL | 3 refills | Status: DC | PRN

## 2020-04-19 MED FILL — FLUTICASONE PROP 50 MCG SPR: 50 | 30 days supply | Qty: 16 | Fill #0

## 2020-04-19 MED FILL — CETIRIZINE HCL 10 MG TABS: 10 | 50 days supply | Qty: 100 | Fill #0

## 2020-04-19 NOTE — Assessment & Plan Note (Signed)
Asymptomatic and stable. Continue to monitor. She will discuss with hematology if they have any concerns with her taking a COVID vaccination

## 2020-04-19 NOTE — Assessment & Plan Note (Signed)
Has been flared recently which is flaring her vertigo. Encouraged Cetirizine bid and Flonase daily. Continue nasal saline and report if no improvement

## 2020-04-19 NOTE — Assessment & Plan Note (Signed)
Hydrate well and treat allergies. She will report if it continues to worsen

## 2020-04-19 NOTE — Progress Notes (Signed)
Virtual Visit via Video Note  I connected with Laura Bender on 04/19/20 at  9:40 AM EDT by a video enabled telemedicine application and verified that I am speaking with the correct person using two identifiers.  Location: Patient: work, patient was in visit Provider: work, physician was in visit   I discussed the limitations of evaluation and management by telemedicine and the availability of in person appointments. The patient expressed understanding and agreed to proceed. Kem Boroughs, CMA was able to set up on a video visit   Subjective:    [name], female    DOB: June 25, 1956, 64 y.o.   MRN: [id number]  Chief Complaint  Patient presents with  . 3 month follow up    HPI Patient is in today for follow up on chronic medical concerns. No recent febrile illness or hospitalizations. She is having a flare in ear pain and congestion related to seasonal allergies. This has then led to increased episodes of vertigo/disequilibrium and even some nausea at times. No fevers or chills. Denies CP/palp/SOB/HA/fevers or GU c/o. Taking meds as prescribed  Past Medical History:  Diagnosis Date  . Advanced care planning/counseling discussion 09/01/2014  . Allergic state 04/17/2013  . Anxiety   . 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 Clotting disorder ([place])   . Depression 03-04-12   tx. meds  . Depression with anxiety 04/17/2013  . ETOH abuse   . Fibromyalgia 03-04-12   Sporadic pain  . GERD (gastroesophageal reflux disease) 08/17/2013  . History of transfusion of platelets X 1   "related to ITP"  . Hyperlipidemia, mixed 10/16/2015  . ITP (idiopathic thrombocytopenic purpura) 03-04-12   Dx.  '95- blow normal levels, but stable.  . Lipoma 03-04-12   Rt. back about scapular ares  . Migraine    "that's what the dr thinks I had yesterday; still unsure; never had one before" (06/19/2016)  . Overweight 03/06/2015  . PONV (postoperative nausea and vomiting)   .  Preventative health care 04/17/2013; 08/17/2013  . Solitary pulmonary nodule 08/17/2013   LLL seen on CT in March 2014    Past Surgical History:  Procedure Laterality Date  . APPENDECTOMY  1995  . BREAST BIOPSY Left 2012   needle biopsy(benign)-Titanium needle marker remains  . CHOLECYSTECTOMY N/A 04/24/2013   Procedure: LAPAROSCOPIC CHOLECYSTECTOMY WITH INTRAOPERATIVE CHOLANGIOGRAM;  Surgeon: Haywood Lasso, MD;  Location: [facility];  Service: General;  Laterality: N/A;  . COLONOSCOPY  2008   WNL, Dr Cristina Gong  . IRRIGATION AND DEBRIDEMENT ABSCESS  ~ 2005 X 2   buttocks  . LIPOMA EXCISION  ~ 04/2016   "base of my neck"  . MASS EXCISION  03/10/2012   Procedure: EXCISION MASS;  Surgeon: Odis Hollingshead, MD;  Location: [facility];  Service: General;  Laterality: N/A;  Removal of back lipoma  . 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 SPLENECTOMY, TOTAL  1995  . TOTAL ABDOMINAL HYSTERECTOMY  1995   total, endometriosis, fibroid, ovarian atrophy    Family History  Problem Relation Age of Onset  . Heart disease Maternal Grandmother   . Heart disease Maternal Grandfather   . Heart attack Maternal Grandfather 62  . Heart disease Paternal Grandmother   . Heart attack Paternal Grandmother 86  . Heart disease Paternal Grandfather   . Cancer Paternal Grandfather        chest- smoker  . Diabetes Mother  type 2- controlled by diet  . Atrial fibrillation Father   . Hyperlipidemia Sister   . Migraines Sister   . Cancer Sister        pancreatic  . Depression Sister   . COPD Sister     Social History   Socioeconomic History  . Marital status: Single    Spouse name: Not on file  . Number of children: 0  . Years of education: 71  . Highest education level: Not on file  Occupational History  . Not on file  Tobacco Use  . Smoking status: Former Smoker    Packs/day: 0.75    Years: 5.00    Pack years: 3.75    Types: Cigarettes    Quit date: 02/21/1983    Years since  quitting: 37.1  . Smokeless tobacco: Never Used  Substance and Sexual Activity  . Alcohol use: Yes    Alcohol/week: 12.0 standard drinks    Types: 12 Cans of beer per week  . Drug use: No  . 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 lives with partner   Other Topics Concern  . Not on file  Social History Narrative   Lives at home w/ her roommate   Right-handed   Caffeine: has cut down to 1-2 cups 1/2 caf coffee in the a.m.   Social Determinants of Health   Financial Resource Strain:   . Difficulty of Paying Living Expenses:   Food Insecurity:   . Worried About Charity fundraiser in the Last Year:   . Arboriculturist in the Last Year:   Transportation Needs:   . Film/video editor (Medical):   Marland Kitchen Lack of Transportation (Non-Medical):   Physical Activity:   . Days of Exercise per Week:   . Minutes of Exercise per Session:   Stress:   . Feeling of Stress :   Social Connections:   . Frequency of Communication with Friends and Family:   . Frequency of Social Gatherings with Friends and Family:   . Attends Religious Services:   . Active Member of Clubs or Organizations:   . Attends Archivist Meetings:   Marland Kitchen Marital Status:   Intimate Partner Violence:   . Fear of Current or Ex-Partner:   . Emotionally Abused:   Marland Kitchen Physically Abused:   . Sexually Abused:     Outpatient Medications Prior to Visit  Medication Sig Dispense Refill  . acetaminophen (TYLENOL) 500 MG tablet Take 1,000 mg by mouth every 6 (six) hours as needed for pain.    . Calcium Carb-Cholecalciferol (CALCIUM 600 + D PO) Take 1 tablet by mouth daily.    . Cholecalciferol (VITAMIN D) 2000 units CAPS Take 2,000 Units by mouth daily.    . clorazepate (TRANXENE) 7.5 MG tablet TAKE 1 TABLET BY MOUTH EVERY NIGHT AT BEDTIME 90 tablet 0  . docusate sodium (COLACE) 100 MG capsule Take 100 mg by mouth daily.    . famotidine (PEPCID) 40 MG  tablet Take 1 tablet (40 mg total) by mouth at bedtime. 30 tablet 5  . FLUoxetine (PROZAC) 20 MG capsule TAKE 2 CAPSULES BY MOUTH DAILY 180 capsule 1  . omeprazole (PRILOSEC) 20 MG capsule Take 1 capsule (20 mg total) by mouth daily. 90 capsule 1  . topiramate (TOPAMAX) 25 MG tablet Take 1 tablet (25 mg total) by mouth daily. 90 tablet 1  . traZODone (DESYREL) 50 MG tablet  TAKE 1 & 1/2 TABLETS BY MOUTH AT BEDTIME 135 tablet 1  . atorvastatin (LIPITOR) 10 MG tablet Take 1 tablet (10 mg total) by mouth daily. 30 tablet 3  . doxycycline (VIBRA-TABS) 100 MG tablet Take 1 tablet (100 mg total) by mouth 2 (two) times daily. 14 tablet 0  . HYDROcodone-homatropine (HYCODAN) 5-1.5 MG/5ML syrup Take 5 mLs by mouth every 8 (eight) hours as needed for cough. 120 mL 0   No facility-administered medications prior to visit.    Allergies  Allergen Reactions  . Atorvastatin Other (See Comments)    Joint pain and weight gain  . Codeine Nausea And Vomiting    Review of Systems  Constitutional: Positive for malaise/fatigue. Negative for fever.  HENT: Positive for congestion and ear pain.   Eyes: Negative for blurred vision.  Respiratory: Negative for shortness of breath.   Cardiovascular: Negative for chest pain, palpitations and leg swelling.  Gastrointestinal: Negative for abdominal pain, blood in stool and nausea.  Genitourinary: Negative for dysuria and frequency.  Musculoskeletal: Negative for falls.  Skin: Negative for rash.  Neurological: Negative for dizziness, loss of consciousness and headaches.  Endo/Heme/Allergies: Negative for environmental allergies.  Psychiatric/Behavioral: Negative for depression. The patient is not nervous/anxious.        Objective:    Physical Exam  There were no vitals taken for this visit. Wt Readings from Last 3 Encounters:  01/19/20 190 lb (86.2 kg)  07/09/19 192 lb (87.1 kg)  12/05/18 187 lb 6.3 oz (85 kg)    Diabetic Foot Exam - Simple   No data  filed     Lab Results  Component Value Date   WBC 5.6 01/19/2020   HGB 13.0 01/19/2020   HCT 39.7 01/19/2020   PLT 47.0 Repeated and verified X2. (LL) 01/19/2020   GLUCOSE 95 01/19/2020   CHOL 222 (H) 01/19/2020   TRIG 241.0 (H) 01/19/2020   HDL 57.10 01/19/2020   LDLDIRECT 114.0 01/19/2020   LDLCALC 109 (H) 07/16/2019   ALT 14 01/19/2020   AST 17 01/19/2020   NA 136 01/19/2020   K 4.3 01/19/2020   CL 103 01/19/2020   CREATININE 0.85 01/19/2020   BUN 23 01/19/2020   CO2 30 01/19/2020   TSH 1.92 01/19/2020   INR 0.89 05/14/2018   HGBA1C 4.8 06/19/2016    Lab Results  Component Value Date   TSH 1.92 01/19/2020   Lab Results  Component Value Date   WBC 5.6 01/19/2020   HGB 13.0 01/19/2020   HCT 39.7 01/19/2020   MCV 96.2 01/19/2020   PLT 47.0 Repeated and verified X2. (LL) 01/19/2020   Lab Results  Component Value Date   NA 136 01/19/2020   K 4.3 01/19/2020   CO2 30 01/19/2020   GLUCOSE 95 01/19/2020   BUN 23 01/19/2020   CREATININE 0.85 01/19/2020   BILITOT 0.4 01/19/2020   ALKPHOS 89 01/19/2020   AST 17 01/19/2020   ALT 14 01/19/2020   PROT 7.6 01/19/2020   ALBUMIN 4.2 01/19/2020   CALCIUM 9.6 01/19/2020   ANIONGAP 7 06/19/2016   GFR 67.54 01/19/2020   Lab Results  Component Value Date   CHOL 222 (H) 01/19/2020   Lab Results  Component Value Date   HDL 57.10 01/19/2020   Lab Results  Component Value Date   LDLCALC 109 (H) 07/16/2019   Lab Results  Component Value Date   TRIG 241.0 (H) 01/19/2020   Lab Results  Component Value Date   CHOLHDL 4 01/19/2020  Lab Results  Component Value Date   HGBA1C 4.8 06/19/2016       Assessment & Plan:   Problem List Items Addressed This Visit    Idiopathic thrombocytopenic purpura ([place]) (Chronic)    Asymptomatic and stable. Continue to monitor. She will discuss with hematology if they have any concerns with her taking a COVID vaccination      Allergy    Has been flared recently which is  flaring her vertigo. Encouraged Cetirizine bid and Flonase daily. Continue nasal saline and report if no improvement      Vertigo    Hydrate well and treat allergies. She will report if it continues to worsen      COVID-19    She feels much better. No c/o persistent symptoms         I have discontinued Nobie Putnam. [place] "Pam"'s HYDROcodone-homatropine, doxycycline, and atorvastatin. I am also having her start on fluticasone and cetirizine. Additionally, I am having her maintain her acetaminophen, Calcium Carb-Cholecalciferol (CALCIUM 600 + D PO), docusate sodium, Vitamin D, traZODone, clorazepate, famotidine, omeprazole, topiramate, and FLUoxetine.  Meds ordered this encounter  Medications  . fluticasone (FLONASE) 50 MCG/ACT nasal spray    Sig: Place 2 sprays into both nostrils daily.    Dispense:  16 g    Refill:  3  . cetirizine (ZYRTEC) 10 MG tablet    Sig: Take 1 tablet (10 mg total) by mouth 2 (two) times daily as needed for allergies.    Dispense:  60 tablet    Refill:  3     I discussed the assessment and treatment plan with the patient. The patient was provided an opportunity to ask questions and all were answered. The patient agreed with the plan and demonstrated an understanding of the instructions.   The patient was advised to call back or seek an in-person evaluation if the symptoms worsen or if the condition fails to improve as anticipated.  I provided 20 minutes of non-face-to-face time during this encounter.   Penni Homans, MD

## 2020-04-19 NOTE — Assessment & Plan Note (Signed)
She feels much better. No c/o persistent symptoms

## 2020-05-26 ENCOUNTER — Other Ambulatory Visit: Payer: Self-pay | Admitting: Family Medicine

## 2020-05-26 MED FILL — traZODone HCL 50 MG TABS: 50 | 90 days supply | Qty: 135 | Fill #0

## 2020-05-26 MED FILL — CLORAZEPATE 7.5 MG TABLET: 7.5 | 90 days supply | Qty: 90 | Fill #0

## 2020-05-26 MED FILL — OMEPRAZOLE DR 20 MG CAPSULE: 20 | 90 days supply | Qty: 90 | Fill #1

## 2020-05-26 NOTE — Telephone Encounter (Signed)
[facility]. Please advise.

## 2020-06-20 ENCOUNTER — Encounter: Payer: Self-pay | Admitting: Family Medicine

## 2020-06-30 ENCOUNTER — Other Ambulatory Visit: Payer: Self-pay | Admitting: Family Medicine

## 2020-06-30 MED FILL — TOPIRAMATE 25 MG TAB: 25 | 90 days supply | Qty: 90 | Fill #0

## 2020-06-30 NOTE — Telephone Encounter (Signed)
Last office visit-01-19-2020 Last refill- 12-29-2019 Next office visit- 08-22-2020

## 2020-07-21 MED FILL — FLUoxetine HCL 20 MG CAPS: 20 | 90 days supply | Qty: 180 | Fill #1

## 2020-08-11 DIAGNOSIS — Z20828 Contact with and (suspected) exposure to other viral communicable diseases: Secondary | ICD-10-CM | POA: Diagnosis not present

## 2020-08-22 ENCOUNTER — Other Ambulatory Visit: Payer: Self-pay

## 2020-08-22 ENCOUNTER — Ambulatory Visit (INDEPENDENT_AMBULATORY_CARE_PROVIDER_SITE_OTHER): Payer: BC Managed Care – PPO | Admitting: Family Medicine

## 2020-08-22 VITALS — BP 117/70 | HR 64 | Temp 98.1°F | Resp 12 | Ht 68.0 in | Wt 188.2 lb

## 2020-08-22 DIAGNOSIS — F418 Other specified anxiety disorders: Secondary | ICD-10-CM

## 2020-08-22 DIAGNOSIS — U071 COVID-19: Secondary | ICD-10-CM | POA: Diagnosis not present

## 2020-08-22 DIAGNOSIS — E782 Mixed hyperlipidemia: Secondary | ICD-10-CM

## 2020-08-22 DIAGNOSIS — D693 Immune thrombocytopenic purpura: Secondary | ICD-10-CM | POA: Diagnosis not present

## 2020-08-22 DIAGNOSIS — K219 Gastro-esophageal reflux disease without esophagitis: Secondary | ICD-10-CM

## 2020-08-22 DIAGNOSIS — Z23 Encounter for immunization: Secondary | ICD-10-CM

## 2020-08-22 MED ORDER — TRAZODONE HCL 50 MG PO TABS: 100.0000 mg | ORAL_TABLET | Freq: Every evening | ORAL | 1 refills | Status: DC | PRN

## 2020-08-22 NOTE — Assessment & Plan Note (Signed)
Overall she is managing despite the stress of the pandemic. No changes

## 2020-08-22 NOTE — Assessment & Plan Note (Signed)
Asymptomatic, continue to monitor.

## 2020-08-22 NOTE — Assessment & Plan Note (Signed)
Encouraged heart healthy diet, increase exercise, avoid trans fats, consider a krill oil cap daily 

## 2020-08-22 NOTE — Patient Instructions (Signed)
Melatonin  5 mg   Insomnia Insomnia is a sleep disorder that makes it difficult to fall asleep or stay asleep. Insomnia can cause fatigue, low energy, difficulty concentrating, mood swings, and poor performance at work or school. There are three different ways to classify insomnia:  Difficulty falling asleep.  Difficulty staying asleep.  Waking up too early in the morning. Any type of insomnia can be long-term (chronic) or short-term (acute). Both are common. Short-term insomnia usually lasts for three months or less. Chronic insomnia occurs at least three times a week for longer than three months. What are the causes? Insomnia may be caused by another condition, situation, or substance, such as:  Anxiety.  Certain medicines.  Gastroesophageal reflux disease (GERD) or other gastrointestinal conditions.  Asthma or other breathing conditions.  Restless legs syndrome, sleep apnea, or other sleep disorders.  Chronic pain.  Menopause.  Stroke.  Abuse of alcohol, tobacco, or illegal drugs.  Mental health conditions, such as depression.  Caffeine.  Neurological disorders, such as Alzheimer's disease.  An overactive thyroid (hyperthyroidism). Sometimes, the cause of insomnia may not be known. What increases the risk? Risk factors for insomnia include:  Gender. Women are affected more often than men.  Age. Insomnia is more common as you get older.  Stress.  Lack of exercise.  Irregular work schedule or working night shifts.  Traveling between different time zones.  Certain medical and mental health conditions. What are the signs or symptoms? If you have insomnia, the main symptom is having trouble falling asleep or having trouble staying asleep. This may lead to other symptoms, such as:  Feeling fatigued or having low energy.  Feeling nervous about going to sleep.  Not feeling rested in the morning.  Having trouble concentrating.  Feeling irritable, anxious,  or depressed. How is this diagnosed? This condition may be diagnosed based on:  Your symptoms and medical history. Your health care provider may ask about: ? Your sleep habits. ? Any medical conditions you have. ? Your mental health.  A physical exam. How is this treated? Treatment for insomnia depends on the cause. Treatment may focus on treating an underlying condition that is causing insomnia. Treatment may also include:  Medicines to help you sleep.  Counseling or therapy.  Lifestyle adjustments to help you sleep better. Follow these instructions at home: Eating and drinking   Limit or avoid alcohol, caffeinated beverages, and cigarettes, especially close to bedtime. These can disrupt your sleep.  Do not eat a large meal or eat spicy foods right before bedtime. This can lead to digestive discomfort that can make it hard for you to sleep. Sleep habits   Keep a sleep diary to help you and your health care provider figure out what could be causing your insomnia. Write down: ? When you sleep. ? When you wake up during the night. ? How well you sleep. ? How rested you feel the next day. ? Any side effects of medicines you are taking. ? What you eat and drink.  Make your bedroom a dark, comfortable place where it is easy to fall asleep. ? Put up shades or blackout curtains to block light from outside. ? Use a white noise machine to block noise. ? Keep the temperature cool.  Limit screen use before bedtime. This includes: ? Watching TV. ? Using your smartphone, tablet, or computer.  Stick to a routine that includes going to bed and waking up at the same times every day and night. This can  help you fall asleep faster. Consider making a quiet activity, such as reading, part of your nighttime routine.  Try to avoid taking naps during the day so that you sleep better at night.  Get out of bed if you are still awake after 15 minutes of trying to sleep. Keep the lights down,  but try reading or doing a quiet activity. When you feel sleepy, go back to bed. General instructions  Take over-the-counter and prescription medicines only as told by your health care provider.  Exercise regularly, as told by your health care provider. Avoid exercise starting several hours before bedtime.  Use relaxation techniques to manage stress. Ask your health care provider to suggest some techniques that may work well for you. These may include: ? Breathing exercises. ? Routines to release muscle tension. ? Visualizing peaceful scenes.  Make sure that you drive carefully. Avoid driving if you feel very sleepy.  Keep all follow-up visits as told by your health care provider. This is important. Contact a health care provider if:  You are tired throughout the day.  You have trouble in your daily routine due to sleepiness.  You continue to have sleep problems, or your sleep problems get worse. Get help right away if:  You have serious thoughts about hurting yourself or someone else. If you ever feel like you may hurt yourself or others, or have thoughts about taking your own life, get help right away. You can go to your nearest emergency department or call:  Your local emergency services (911 in the [place]  A suicide crisis helpline, such as the [facility] at 8322154226. This is open 24 hours a day. Summary  Insomnia is a sleep disorder that makes it difficult to fall asleep or stay asleep.  Insomnia can be long-term (chronic) or short-term (acute).  Treatment for insomnia depends on the cause. Treatment may focus on treating an underlying condition that is causing insomnia.  Keep a sleep diary to help you and your health care provider figure out what could be causing your insomnia. This information is not intended to replace advice given to you by your health care provider. Make sure you discuss any questions you have with your health care  provider. Document Revised: 11/01/2017 Document Reviewed: 08/29/2017 Elsevier Patient Education  2020 [employer].

## 2020-08-22 NOTE — Assessment & Plan Note (Signed)
Avoid offending foods, start probiotics. Do not eat large meals in late evening and consider raising head of bed.  

## 2020-08-22 NOTE — Assessment & Plan Note (Signed)
Was ill back in January 2021 and she feels well today. She requests antibody testing as she decides whether she wants to take the [place] vaccination. She is encouraged to take the vaccination and call with any concerns.

## 2020-08-22 NOTE — Progress Notes (Signed)
Subjective:    [name], female    DOB: [birth year], 64 y.o.   MRN: [id number]  Chief Complaint  Patient presents with  . Follow-up    HPI Patient is in today for follow up on chronic medical concerns. No recent febrile illness or hospitalizations. No polyuria or polydipsia. She continues to note fatigue and anhedonia. She continues to work full time and tolerate this. No bleeding concerns. Denies CP/palp/SOB/HA/congestion/fevers/GI or GU c/o. Taking meds as prescribed  Past Medical History:  Diagnosis Date  . Advanced care planning/counseling discussion 09/01/2014  . Allergic state 04/17/2013  . Anxiety   . 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 Clotting disorder ([place])   . Depression 03-04-12   tx. meds  . Depression with anxiety 04/17/2013  . ETOH abuse   . Fibromyalgia 03-04-12   Sporadic pain  . GERD (gastroesophageal reflux disease) 08/17/2013  . History of transfusion of platelets X 1   "related to ITP"  . Hyperlipidemia, mixed 10/16/2015  . ITP (idiopathic thrombocytopenic purpura) 03-04-12   Dx.  '95- blow normal levels, but stable.  . Lipoma 03-04-12   Rt. back about scapular ares  . Migraine    "that's what the dr thinks I had yesterday; still unsure; never had one before" (06/19/2016)  . Overweight 03/06/2015  . PONV (postoperative nausea and vomiting)   . Preventative health care 04/17/2013; 08/17/2013  . Solitary pulmonary nodule 08/17/2013   LLL seen on CT in March 2014    Past Surgical History:  Procedure Laterality Date  . APPENDECTOMY  1995  . BREAST BIOPSY Left 2012   needle biopsy(benign)-Titanium needle marker remains  . CHOLECYSTECTOMY N/A 04/24/2013   Procedure: LAPAROSCOPIC CHOLECYSTECTOMY WITH INTRAOPERATIVE CHOLANGIOGRAM;  Surgeon: Haywood Lasso, MD;  Location: [facility];  Service: General;  Laterality: N/A;  . COLONOSCOPY  2008   WNL, Dr Cristina Gong  . IRRIGATION AND DEBRIDEMENT ABSCESS  ~ 2005 X 2   buttocks  . LIPOMA EXCISION  ~  04/2016   "base of my neck"  . MASS EXCISION  03/10/2012   Procedure: EXCISION MASS;  Surgeon: Odis Hollingshead, MD;  Location: [facility];  Service: General;  Laterality: N/A;  Removal of back lipoma  . 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 SPLENECTOMY, TOTAL  1995  . TOTAL ABDOMINAL HYSTERECTOMY  1995   total, endometriosis, fibroid, ovarian atrophy    Family History  Problem Relation Age of Onset  . Heart disease Maternal Grandmother   . Heart disease Maternal Grandfather   . Heart attack Maternal Grandfather 62  . Heart disease Paternal Grandmother   . Heart attack Paternal Grandmother 86  . Heart disease Paternal Grandfather   . Cancer Paternal Grandfather        chest- smoker  . Diabetes Mother        type 2- controlled by diet  . Atrial fibrillation Father   . Hyperlipidemia Sister   . Migraines Sister   . Cancer Sister        pancreatic  . Depression Sister   . COPD Sister     Social History   Socioeconomic History  . Marital status: Single    Spouse name: Not on file  . Number of children: 0  . Years of education: 25  . Highest education level: Not on file  Occupational History  . Not on file  Tobacco Use  . Smoking status: Former Smoker  Packs/day: 0.75    Years: 5.00    Pack years: 3.75    Types: Cigarettes    Quit date: 02/21/1983    Years since quitting: 37.5  . Smokeless tobacco: Never Used  Substance and Sexual Activity  . Alcohol use: Yes    Alcohol/week: 12.0 standard drinks    Types: 12 Cans of beer per week  . Drug use: No  . 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 lives with partner   Other Topics Concern  . Not on file  Social History Narrative   Lives at home w/ her roommate   Right-handed   Caffeine: has cut down to 1-2 cups 1/2 caf coffee in the a.m.   Social Determinants of Health   Financial Resource Strain:   . Difficulty of Paying Living  Expenses: Not on file  Food Insecurity:   . Worried About Charity fundraiser in the Last Year: Not on file  . Ran Out of Food in the Last Year: Not on file  Transportation Needs:   . Lack of Transportation (Medical): Not on file  . Lack of Transportation (Non-Medical): Not on file  Physical Activity:   . Days of Exercise per Week: Not on file  . Minutes of Exercise per Session: Not on file  Stress:   . Feeling of Stress : Not on file  Social Connections:   . Frequency of Communication with Friends and Family: Not on file  . Frequency of Social Gatherings with Friends and Family: Not on file  . Attends Religious Services: Not on file  . Active Member of Clubs or Organizations: Not on file  . Attends Archivist Meetings: Not on file  . Marital Status: Not on file  Intimate Partner Violence:   . Fear of Current or Ex-Partner: Not on file  . Emotionally Abused: Not on file  . Physically Abused: Not on file  . Sexually Abused: Not on file    Outpatient Medications Prior to Visit  Medication Sig Dispense Refill  . acetaminophen (TYLENOL) 500 MG tablet Take 1,000 mg by mouth every 6 (six) hours as needed for pain.    . Calcium Carb-Cholecalciferol (CALCIUM 600 + D PO) Take 1 tablet by mouth daily.    . cetirizine (ZYRTEC) 10 MG tablet Take 1 tablet (10 mg total) by mouth 2 (two) times daily as needed for allergies. 60 tablet 3  . Cholecalciferol (VITAMIN D) 2000 units CAPS Take 2,000 Units by mouth daily.    . clorazepate (TRANXENE) 7.5 MG tablet TAKE 1 TABLET BY MOUTH EVERY NIGHT AT BEDTIME 90 tablet 0  . docusate sodium (COLACE) 100 MG capsule Take 100 mg by mouth daily.    . famotidine (PEPCID) 40 MG tablet Take 1 tablet (40 mg total) by mouth at bedtime. 30 tablet 5  . FLUoxetine (PROZAC) 20 MG capsule TAKE 2 CAPSULES BY MOUTH DAILY 180 capsule 1  . fluticasone (FLONASE) 50 MCG/ACT nasal spray Place 2 sprays into both nostrils daily. 16 g 3  . omeprazole (PRILOSEC) 20 MG  capsule Take 1 capsule (20 mg total) by mouth daily. 90 capsule 1  . topiramate (TOPAMAX) 25 MG tablet TAKE 1 TABLET (25 MG TOTAL) BY MOUTH DAILY. 90 tablet 1  . traZODone (DESYREL) 50 MG tablet TAKE 1 & 1/2 TABLETS BY MOUTH AT BEDTIME 135 tablet 1   No facility-administered medications prior to visit.    Allergies  Allergen Reactions  . Atorvastatin Other (See Comments)    Joint pain and weight gain  . Codeine Nausea And Vomiting    Review of Systems  Constitutional: Positive for malaise/fatigue. Negative for fever.  HENT: Negative for congestion.   Eyes: Negative for blurred vision.  Respiratory: Negative for shortness of breath.   Cardiovascular: Negative for chest pain, palpitations and leg swelling.  Gastrointestinal: Negative for abdominal pain, blood in stool and nausea.  Genitourinary: Negative for dysuria and frequency.  Musculoskeletal: Negative for falls.  Skin: Negative for rash.  Neurological: Negative for dizziness, loss of consciousness and headaches.  Endo/Heme/Allergies: Negative for environmental allergies.  Psychiatric/Behavioral: Negative for depression. The patient is nervous/anxious.        Objective:    Physical Exam  BP 117/70 (BP Location: Left Arm, Patient Position: Sitting, Cuff Size: Large)   Pulse 64   Temp 98.1 F (36.7 C) (Oral)   Resp 12   Ht 5\' 8"  (1.727 m)   Wt 188 lb 3.2 oz (85.4 kg)   SpO2 99%   BMI 28.62 kg/m  Wt Readings from Last 3 Encounters:  08/22/20 188 lb 3.2 oz (85.4 kg)  01/19/20 190 lb (86.2 kg)  07/09/19 192 lb (87.1 kg)    Diabetic Foot Exam - Simple   No data filed     Lab Results  Component Value Date   WBC 5.6 01/19/2020   HGB 13.0 01/19/2020   HCT 39.7 01/19/2020   PLT 47.0 Repeated and verified X2. (LL) 01/19/2020   GLUCOSE 95 01/19/2020   CHOL 222 (H) 01/19/2020   TRIG 241.0 (H) 01/19/2020   HDL 57.10 01/19/2020   LDLDIRECT 114.0 01/19/2020   LDLCALC 109 (H) 07/16/2019   ALT 14 01/19/2020   AST  17 01/19/2020   NA 136 01/19/2020   K 4.3 01/19/2020   CL 103 01/19/2020   CREATININE 0.85 01/19/2020   BUN 23 01/19/2020   CO2 30 01/19/2020   TSH 1.92 01/19/2020   INR 0.89 05/14/2018   HGBA1C 4.8 06/19/2016    Lab Results  Component Value Date   TSH 1.92 01/19/2020   Lab Results  Component Value Date   WBC 5.6 01/19/2020   HGB 13.0 01/19/2020   HCT 39.7 01/19/2020   MCV 96.2 01/19/2020   PLT 47.0 Repeated and verified X2. (LL) 01/19/2020   Lab Results  Component Value Date   NA 136 01/19/2020   K 4.3 01/19/2020   CO2 30 01/19/2020   GLUCOSE 95 01/19/2020   BUN 23 01/19/2020   CREATININE 0.85 01/19/2020   BILITOT 0.4 01/19/2020   ALKPHOS 89 01/19/2020   AST 17 01/19/2020   ALT 14 01/19/2020   PROT 7.6 01/19/2020   ALBUMIN 4.2 01/19/2020   CALCIUM 9.6 01/19/2020   ANIONGAP 7 06/19/2016   GFR 67.54 01/19/2020   Lab Results  Component Value Date   CHOL 222 (H) 01/19/2020   Lab Results  Component Value Date   HDL 57.10 01/19/2020   Lab Results  Component Value Date   LDLCALC 109 (H) 07/16/2019   Lab Results  Component Value Date   TRIG 241.0 (H) 01/19/2020   Lab Results  Component Value Date   CHOLHDL 4 01/19/2020   Lab Results  Component Value Date   HGBA1C 4.8 06/19/2016       Assessment & Plan:   Problem List Items Addressed This Visit    Idiopathic thrombocytopenic purpura (HCC) (Chronic)    Asymptomatic, continue to monitor  Relevant Orders   CBC with Differential/Platelet   TSH   Depression with anxiety    Overall she is managing despite the stress of the pandemic. No changes      Relevant Medications   traZODone (DESYREL) 50 MG tablet   GERD (gastroesophageal reflux disease)    Avoid offending foods, start probiotics. Do not eat large meals in late evening and consider raising head of bed.       Hyperlipidemia, mixed    Encouraged heart healthy diet, increase exercise, avoid trans fats, consider a krill oil cap daily        Relevant Orders   Lipid panel   Comprehensive metabolic panel   TSH   [id number]    Was ill back in January 2021 and she feels well today. She requests antibody testing as she decides whether she wants to take the [place] vaccination. She is encouraged to take the vaccination and call with any concerns.       Relevant Orders   SARS CoV2 Serology(COVID19) AB(IgG,IgM),Immunoassay    Other Visit Diagnoses    Influenza vaccine needed    -  Primary   Relevant Orders   Flu Vaccine QUAD 6+ mos PF IM (Fluarix Quad PF) (Completed)      I have changed [facility] Gainer "Pam"'s traZODone. I am also having her maintain her acetaminophen, Calcium Carb-Cholecalciferol (CALCIUM 600 + D PO), docusate sodium, Vitamin D, famotidine, omeprazole, FLUoxetine, fluticasone, cetirizine, clorazepate, and topiramate.  Meds ordered this encounter  Medications  . traZODone (DESYREL) 50 MG tablet    Sig: Take 2 tablets (100 mg total) by mouth at bedtime as needed for sleep.    Dispense:  135 tablet    Refill:  1     Penni Homans, MD

## 2020-08-23 LAB — TSH: TSH: 1.63 mIU/L (ref 0.40–4.50)

## 2020-08-23 LAB — COMPREHENSIVE METABOLIC PANEL
AG Ratio: 1.2 (calc) (ref 1.0–2.5)
ALT: 15 U/L (ref 6–29)
AST: 21 U/L (ref 10–35)
Albumin: 4.3 g/dL (ref 3.6–5.1)
Alkaline phosphatase (APISO): 109 U/L (ref 37–153)
BUN/Creatinine Ratio: 21 (calc) (ref 6–22)
BUN: 23 mg/dL (ref 7–25)
CO2: 29 mmol/L (ref 20–32)
Calcium: 9.7 mg/dL (ref 8.6–10.4)
Chloride: 104 mmol/L (ref 98–110)
Creat: 1.1 mg/dL — ABNORMAL HIGH (ref 0.50–0.99)
Globulin: 3.5 g/dL (calc) (ref 1.9–3.7)
Glucose, Bld: 92 mg/dL (ref 65–99)
Potassium: 4.9 mmol/L (ref 3.5–5.3)
Sodium: 140 mmol/L (ref 135–146)
Total Bilirubin: 0.3 mg/dL (ref 0.2–1.2)
Total Protein: 7.8 g/dL (ref 6.1–8.1)

## 2020-08-23 LAB — CBC WITH DIFFERENTIAL/PLATELET
Absolute Monocytes: 783 cells/uL (ref 200–950)
Basophils Absolute: 81 cells/uL (ref 0–200)
Basophils Relative: 1.4 %
Eosinophils Absolute: 81 cells/uL (ref 15–500)
Eosinophils Relative: 1.4 %
HCT: 40 % (ref 35.0–45.0)
Hemoglobin: 12.9 g/dL (ref 11.7–15.5)
Lymphs Abs: 2790 cells/uL (ref 850–3900)
MCH: 31 pg (ref 27.0–33.0)
MCHC: 32.3 g/dL (ref 32.0–36.0)
MCV: 96.2 fL (ref 80.0–100.0)
MPV: 12.6 fL — ABNORMAL HIGH (ref 7.5–12.5)
Monocytes Relative: 13.5 %
Neutro Abs: 2065 cells/uL (ref 1500–7800)
Neutrophils Relative %: 35.6 %
Platelets: 66 10*3/uL — ABNORMAL LOW (ref 140–400)
RBC: 4.16 10*6/uL (ref 3.80–5.10)
RDW: 13 % (ref 11.0–15.0)
Total Lymphocyte: 48.1 %
WBC: 5.8 10*3/uL (ref 3.8–10.8)

## 2020-08-23 LAB — LIPID PANEL
Cholesterol: 202 mg/dL — ABNORMAL HIGH (ref <200)
HDL: 67 mg/dL (ref >=50)
LDL Cholesterol (Calc): 111 mg/dL (calc) — ABNORMAL HIGH
Non-HDL Cholesterol (Calc): 135 mg/dL (calc) — ABNORMAL HIGH (ref <130)
Total CHOL/HDL Ratio: 3 (calc) (ref <5.0)
Triglycerides: 125 mg/dL (ref <150)

## 2020-08-23 LAB — SARS COV-2 SEROLOGY(COVID-19)AB(IGG,IGM),IMMUNOASSAY
SARS CoV-2 AB IgG: NEGATIVE
SARS CoV-2 IgM: NEGATIVE

## 2020-08-25 MED FILL — traZODone HCL 50 MG TABS: 50 | 90 days supply | Qty: 135 | Fill #1

## 2020-09-02 MED FILL — OMEPRAZOLE 20 MG CAP: 20 | 90 days supply | Qty: 90 | Fill #1

## 2020-09-30 MED FILL — TOPIRAMATE 25 MG TAB: 25 | 90 days supply | Qty: 90 | Fill #1

## 2020-11-04 ENCOUNTER — Other Ambulatory Visit: Payer: Self-pay | Admitting: Family Medicine

## 2020-11-04 MED FILL — FLUoxetine HCL 20 MG CAPS: 20 | 90 days supply | Qty: 180 | Fill #0

## 2020-11-07 ENCOUNTER — Other Ambulatory Visit: Payer: Self-pay | Admitting: Family Medicine

## 2020-11-07 MED FILL — traZODone HCL 50 MG TABS: 50 | 90 days supply | Qty: 180 | Fill #0

## 2020-11-11 ENCOUNTER — Telehealth: Payer: Self-pay

## 2020-11-11 ENCOUNTER — Encounter: Payer: Self-pay | Admitting: Family Medicine

## 2020-11-11 NOTE — Telephone Encounter (Signed)
PA initiated via Covermymeds; KEY: [id number]. Awaiting determination.

## 2020-11-14 NOTE — Telephone Encounter (Signed)
PA cancelled? Awaiting further information.

## 2021-01-06 ENCOUNTER — Other Ambulatory Visit: Payer: Self-pay | Admitting: Family Medicine

## 2021-01-06 MED FILL — TOPIRAMATE 25 MG TAB: 25 | 90 days supply | Qty: 90 | Fill #0

## 2021-02-07 ENCOUNTER — Other Ambulatory Visit: Payer: Self-pay | Admitting: Family Medicine

## 2021-02-07 MED FILL — FLUoxetine HCL 20 MG CAPS: 20 | 90 days supply | Qty: 180 | Fill #1

## 2021-02-07 NOTE — Telephone Encounter (Signed)
Requesting: clorazepate 7.5mg  Contract: 02/04/2018 UDS: 02/04/2018 Last Visit: 08/22/2020 Next Visit: 03/09/21 Last Refill: 05/26/2020 #90 and 0RF  Please Advise

## 2021-02-08 MED FILL — traZODone HCL 50 MG TABS: 50 | 90 days supply | Qty: 180 | Fill #1

## 2021-03-01 ENCOUNTER — Encounter: Payer: Self-pay | Admitting: Family Medicine

## 2021-03-02 ENCOUNTER — Other Ambulatory Visit: Payer: Self-pay | Admitting: *Deleted

## 2021-03-02 DIAGNOSIS — F418 Other specified anxiety disorders: Secondary | ICD-10-CM

## 2021-03-02 DIAGNOSIS — D693 Immune thrombocytopenic purpura: Secondary | ICD-10-CM

## 2021-03-02 DIAGNOSIS — E782 Mixed hyperlipidemia: Secondary | ICD-10-CM

## 2021-03-03 ENCOUNTER — Other Ambulatory Visit (INDEPENDENT_AMBULATORY_CARE_PROVIDER_SITE_OTHER): Payer: BC Managed Care – PPO

## 2021-03-03 ENCOUNTER — Other Ambulatory Visit (HOSPITAL_BASED_OUTPATIENT_CLINIC_OR_DEPARTMENT_OTHER): Payer: Self-pay | Admitting: Family Medicine

## 2021-03-03 ENCOUNTER — Other Ambulatory Visit: Payer: Self-pay

## 2021-03-03 DIAGNOSIS — D693 Immune thrombocytopenic purpura: Secondary | ICD-10-CM | POA: Diagnosis not present

## 2021-03-03 DIAGNOSIS — E782 Mixed hyperlipidemia: Secondary | ICD-10-CM | POA: Diagnosis not present

## 2021-03-03 DIAGNOSIS — F418 Other specified anxiety disorders: Secondary | ICD-10-CM

## 2021-03-03 DIAGNOSIS — Z1231 Encounter for screening mammogram for malignant neoplasm of breast: Secondary | ICD-10-CM

## 2021-03-03 LAB — COMPREHENSIVE METABOLIC PANEL
ALT: 13 U/L (ref 0–35)
AST: 17 U/L (ref 0–37)
Albumin: 4.3 g/dL (ref 3.5–5.2)
Alkaline Phosphatase: 94 U/L (ref 39–117)
BUN: 20 mg/dL (ref 6–23)
CO2: 28 mEq/L (ref 19–32)
Calcium: 9.6 mg/dL (ref 8.4–10.5)
Chloride: 104 mEq/L (ref 96–112)
Creatinine, Ser: 0.94 mg/dL (ref 0.40–1.20)
GFR: 64.27 mL/min (ref >60.00)
Glucose, Bld: 118 mg/dL — ABNORMAL HIGH (ref 70–99)
Potassium: 4.3 mEq/L (ref 3.5–5.1)
Sodium: 140 mEq/L (ref 135–145)
Total Bilirubin: 0.4 mg/dL (ref 0.2–1.2)
Total Protein: 7.7 g/dL (ref 6.0–8.3)

## 2021-03-03 LAB — CBC WITH DIFFERENTIAL/PLATELET
Basophils Absolute: 0.1 10*3/uL (ref 0.0–0.1)
Basophils Relative: 1.7 % (ref 0.0–3.0)
Eosinophils Absolute: 0.1 10*3/uL (ref 0.0–0.7)
Eosinophils Relative: 2.2 % (ref 0.0–5.0)
HCT: 40 % (ref 36.0–46.0)
Hemoglobin: 13.3 g/dL (ref 12.0–15.0)
Lymphocytes Relative: 44.8 % (ref 12.0–46.0)
Lymphs Abs: 2 10*3/uL (ref 0.7–4.0)
MCHC: 33.4 g/dL (ref 30.0–36.0)
MCV: 94.3 fl (ref 78.0–100.0)
Monocytes Absolute: 0.4 10*3/uL (ref 0.1–1.0)
Monocytes Relative: 8.7 % (ref 3.0–12.0)
Neutro Abs: 1.9 10*3/uL (ref 1.4–7.7)
Neutrophils Relative %: 42.6 % — ABNORMAL LOW (ref 43.0–77.0)
Platelets: 71 10*3/uL — ABNORMAL LOW (ref 150.0–400.0)
RBC: 4.24 Mil/uL (ref 3.87–5.11)
RDW: 12.7 % (ref 11.5–15.5)
WBC: 4.4 10*3/uL (ref 4.0–10.5)

## 2021-03-03 LAB — LIPID PANEL
Cholesterol: 205 mg/dL — ABNORMAL HIGH (ref 0–200)
HDL: 63.2 mg/dL (ref >39.00)
LDL Cholesterol: 121 mg/dL — ABNORMAL HIGH (ref 0–99)
NonHDL: 141.94
Total CHOL/HDL Ratio: 3
Triglycerides: 105 mg/dL (ref 0.0–149.0)
VLDL: 21 mg/dL (ref 0.0–40.0)

## 2021-03-03 LAB — TSH: TSH: 1.57 u[IU]/mL (ref 0.35–4.50)

## 2021-03-08 ENCOUNTER — Other Ambulatory Visit: Payer: Self-pay

## 2021-03-09 ENCOUNTER — Ambulatory Visit (HOSPITAL_BASED_OUTPATIENT_CLINIC_OR_DEPARTMENT_OTHER)
Admission: RE | Admit: 2021-03-09 | Discharge: 2021-03-09 | Disposition: A | Discharge status: Home / Self Care | Payer: BC Managed Care – PPO | Source: Ambulatory Visit | Attending: Family Medicine | Admitting: Family Medicine

## 2021-03-09 ENCOUNTER — Encounter: Payer: BC Managed Care – PPO | Admitting: Family Medicine

## 2021-03-09 ENCOUNTER — Encounter (HOSPITAL_BASED_OUTPATIENT_CLINIC_OR_DEPARTMENT_OTHER): Payer: Self-pay

## 2021-03-09 DIAGNOSIS — Z1231 Encounter for screening mammogram for malignant neoplasm of breast: Secondary | ICD-10-CM | POA: Insufficient documentation

## 2021-04-11 ENCOUNTER — Other Ambulatory Visit (HOSPITAL_BASED_OUTPATIENT_CLINIC_OR_DEPARTMENT_OTHER): Payer: Self-pay

## 2021-04-11 MED FILL — Topiramate Tab 25 MG: ORAL | 90 days supply | Qty: 90 | Fill #0 | Status: AC

## 2021-05-08 ENCOUNTER — Other Ambulatory Visit (HOSPITAL_BASED_OUTPATIENT_CLINIC_OR_DEPARTMENT_OTHER): Payer: Self-pay

## 2021-05-08 ENCOUNTER — Other Ambulatory Visit: Payer: Self-pay | Admitting: Family Medicine

## 2021-05-08 MED ORDER — TRAZODONE HCL 50 MG PO TABS
ORAL_TABLET | ORAL | 1 refills | Status: DC
  Filled 2021-05-08: qty 180, 90d supply, fill #0
  Filled 2021-08-01: qty 180, 90d supply, fill #1

## 2021-05-09 DIAGNOSIS — H40013 Open angle with borderline findings, low risk, bilateral: Secondary | ICD-10-CM | POA: Diagnosis not present

## 2021-05-09 DIAGNOSIS — H31091 Other chorioretinal scars, right eye: Secondary | ICD-10-CM | POA: Diagnosis not present

## 2021-05-09 DIAGNOSIS — H43813 Vitreous degeneration, bilateral: Secondary | ICD-10-CM | POA: Diagnosis not present

## 2021-05-09 DIAGNOSIS — H2513 Age-related nuclear cataract, bilateral: Secondary | ICD-10-CM | POA: Diagnosis not present

## 2021-06-22 ENCOUNTER — Other Ambulatory Visit: Payer: Self-pay | Admitting: Family Medicine

## 2021-06-22 ENCOUNTER — Other Ambulatory Visit (HOSPITAL_BASED_OUTPATIENT_CLINIC_OR_DEPARTMENT_OTHER): Payer: Self-pay

## 2021-06-22 MED ORDER — FLUOXETINE HCL 20 MG PO CAPS
40.0000 mg | ORAL_CAPSULE | Freq: Every day | ORAL | 0 refills | Status: DC
  Filled 2021-06-22: qty 60, 30d supply, fill #0

## 2021-07-07 ENCOUNTER — Other Ambulatory Visit: Payer: Self-pay | Admitting: Family Medicine

## 2021-07-10 ENCOUNTER — Other Ambulatory Visit (HOSPITAL_BASED_OUTPATIENT_CLINIC_OR_DEPARTMENT_OTHER): Payer: Self-pay

## 2021-07-10 MED ORDER — TOPIRAMATE 25 MG PO TABS
25.0000 mg | ORAL_TABLET | Freq: Every day | ORAL | 1 refills | Status: DC
  Filled 2021-07-10: qty 90, 90d supply, fill #0
  Filled 2021-10-10: qty 90, 90d supply, fill #1

## 2021-07-18 ENCOUNTER — Encounter: Payer: Self-pay | Admitting: Family

## 2021-07-18 ENCOUNTER — Ambulatory Visit (INDEPENDENT_AMBULATORY_CARE_PROVIDER_SITE_OTHER): Payer: BC Managed Care – PPO | Admitting: Family

## 2021-07-18 ENCOUNTER — Other Ambulatory Visit (HOSPITAL_BASED_OUTPATIENT_CLINIC_OR_DEPARTMENT_OTHER): Payer: Self-pay

## 2021-07-18 ENCOUNTER — Telehealth: Payer: Self-pay | Admitting: *Deleted

## 2021-07-18 ENCOUNTER — Other Ambulatory Visit: Payer: Self-pay

## 2021-07-18 ENCOUNTER — Encounter: Payer: BC Managed Care – PPO | Admitting: Family Medicine

## 2021-07-18 VITALS — BP 122/68 | HR 60 | Temp 97.7°F | Ht 67.5 in | Wt 188.6 lb

## 2021-07-18 DIAGNOSIS — F418 Other specified anxiety disorders: Secondary | ICD-10-CM

## 2021-07-18 DIAGNOSIS — R7309 Other abnormal glucose: Secondary | ICD-10-CM

## 2021-07-18 MED ORDER — ALPRAZOLAM 0.25 MG PO TABS
0.2500 mg | ORAL_TABLET | Freq: Every evening | ORAL | 0 refills | Status: DC | PRN
  Filled 2021-07-18: qty 30, 30d supply, fill #0

## 2021-07-18 MED ORDER — BUPROPION HCL ER (XL) 150 MG PO TB24
150.0000 mg | ORAL_TABLET | Freq: Every day | ORAL | 0 refills | Status: DC
  Filled 2021-07-18: qty 90, 90d supply, fill #0

## 2021-07-18 NOTE — Telephone Encounter (Signed)
Patient was seen by Mickel Baas today and needs to be seen in 4-6 weeks (on or around 08/18/21-09/01/21.  No appointments available.  Need preferably an late afternoon.

## 2021-07-18 NOTE — Progress Notes (Signed)
Laura Bender is a 65 y.o. 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   Chronic migraine 07/03/2016   TIA (transient ischemic attack) 06/19/2016   Stroke-like symptoms 06/19/2016   ETOH abuse    Skin nodule 02/19/2016   Chest pain 02/19/2016   Hyperlipidemia, mixed 10/16/2015   Overweight 03/06/2015   Advanced care planning/counseling discussion 09/01/2014   Vertigo 02/11/2014   GERD (gastroesophageal reflux disease) 08/17/2013   Solitary pulmonary nodule 08/17/2013   Preventative health care 08/17/2013   Allergy 04/17/2013   Fibromyalgia 04/17/2013   Depression with anxiety 04/17/2013   Lipoma of back 02/07/2012   Idiopathic thrombocytopenic purpura ([place]) 11/23/2011    Current Outpatient Medications  Medication Sig Dispense Refill   acetaminophen (TYLENOL) 500 MG tablet Take 1,000 mg by mouth every 6 (six) hours as needed for pain.     ALPRAZolam (XANAX) 0.25 MG tablet Take 1 tablet (0.25 mg total) by mouth at bedtime as needed for anxiety. 30 tablet 0   buPROPion (WELLBUTRIN XL) 150 MG 24 hr tablet Take 1 tablet (150 mg total) by mouth daily. 90 tablet 0   Calcium Carb-Cholecalciferol (CALCIUM 600 + D PO) Take 1 tablet by mouth daily.     cetirizine (ZYRTEC) 10 MG tablet Take 1 tablet (10 mg total) by mouth 2 (two) times daily as needed for allergies. 60 tablet 3   Cholecalciferol (VITAMIN D) 2000 units CAPS Take 2,000 Units by mouth daily.     docusate sodium (COLACE) 100 MG capsule Take 100 mg by mouth daily.     FLUoxetine (PROZAC) 20 MG capsule Take 2 capsules (40 mg total) by mouth daily. 60 capsule 0   fluticasone (FLONASE) 50 MCG/ACT nasal spray Place 2 sprays into both nostrils daily. 16 g 3   omeprazole (PRILOSEC) 20 MG  capsule Take 1 capsule (20 mg total) by mouth daily. 90 capsule 1   topiramate (TOPAMAX) 25 MG tablet Take 1 tablet (25 mg total) by mouth daily. 90 tablet 1   traZODone (DESYREL) 50 MG tablet TAKE 2 TABLETS (100 MG TOTAL) BY MOUTH AT BEDTIME AS NEEDED FOR SLEEP. 180 tablet 1   clorazepate (TRANXENE) 7.5 MG tablet TAKE 1 TABLET BY MOUTH EVERY NIGHT AT BEDTIME (Patient not taking: Reported on 07/18/2021) 90 tablet 0   famotidine (PEPCID) 40 MG tablet Take 1 tablet (40 mg total) by mouth at bedtime. (Patient not taking: Reported on 07/18/2021) 30 tablet 5   No current facility-administered medications for this visit.    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  '95- blow normal levels, but stable.   Lipoma 03-04-12   Rt. back about scapular ares   Migraine    "that's  what the dr thinks I had yesterday; still unsure; never had one before" (06/19/2016)   Overweight 03/06/2015   PONV (postoperative nausea and vomiting)    Preventative health care 04/17/2013; 08/17/2013   Solitary pulmonary nodule 08/17/2013   LLL seen on CT in March 2014    Past Surgical History:  Procedure Laterality Date   [place] Left 2012   needle biopsy(benign)-Titanium needle marker remains   CHOLECYSTECTOMY N/A 04/24/2013   Procedure: LAPAROSCOPIC CHOLECYSTECTOMY WITH INTRAOPERATIVE CHOLANGIOGRAM;  Surgeon: Haywood Lasso, MD;  Location: [facility];  Service: General;   Laterality: N/A;   COLONOSCOPY  2008   WNL, Dr Buccini   IRRIGATION AND DEBRIDEMENT ABSCESS  ~ 2005 X 2   buttocks   LIPOMA EXCISION  ~ 04/2016   "base of my neck"   MASS EXCISION  03/10/2012   Procedure: EXCISION MASS;  Surgeon: Odis Hollingshead, MD;  Location: [facility];  Service: General;  Laterality: N/A;  Removal of back lipoma   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   SPLENECTOMY, TOTAL  1995   TOTAL ABDOMINAL HYSTERECTOMY  1995   total, endometriosis, fibroid, ovarian atrophy    Family History  Problem Relation Age of Onset   Heart disease Maternal Grandmother    Heart disease Maternal Grandfather    Heart attack Maternal Grandfather 62   Heart disease Paternal Grandmother    Heart attack Paternal Grandmother 86   Heart disease Paternal Grandfather    Cancer Paternal Grandfather        chest- smoker   Diabetes Mother        type 2- controlled by diet   Atrial fibrillation Father    Hyperlipidemia Sister    Migraines Sister    Cancer Sister        pancreatic   Depression Sister    COPD Sister     Social History   Tobacco Use   Smoking status: Former    Packs/day: 0.75    Years: 5.00    Pack years: 3.75    Types: Cigarettes    Quit date: 02/21/1983    Years since quitting: 38.4   Smokeless tobacco: Never  Substance Use Topics   Alcohol use: Yes    Alcohol/week: 12.0 standard drinks    Types: 12 Cans of beer per week    Subjective:  Patient is having increased problems with depression; is concerned that her Prozac is not working as well recently; lost her father in May 2022 and they were very close; helping to take care of her mother with sister- notes that she and sister are not close; lost the sister she was closest to at some point in the past; having difficulty sleeping; does feel that she has a good support system- not suicidal; excited about upcoming retirement in February 2023;   Objective:  Vitals:   07/18/21 1504  BP: 122/68  Pulse: 60   Temp: 97.7 F (36.5 C)  TempSrc: Oral  SpO2: 98%  Weight: 188 lb 9.6 oz (85.5 kg)  Height: 5' 7.5" (1.715 m)    General: Well developed, well nourished, in no acute distress  Skin : Warm and dry.  Head: Normocephalic and atraumatic  Lungs: Respirations unlabored;  Neurologic: Alert and oriented; speech intact; face symmetrical; moves all extremities well; CNII-XII intact without focal deficit   Assessment:  1. Depression with anxiety   2. Elevated glucose     Plan:  Will continue  Prozac for now; try adding Wellbutrin Xl 150 mg daily; short term refill on Xanax to help her sleep; she will consider grief counseling through Hospice; plan to see her PCP in 4-6 weeks to discuss response/ touch base in person. Update Hgba1c today; follow up to be determined;  This visit occurred during the SARS-CoV-2 public health emergency.  Safety protocols were in place, including screening questions prior to the visit, additional usage of staff PPE, and extensive cleaning of exam room while observing appropriate contact time as indicated for disinfecting solutions.    No follow-ups on file.  Orders Placed This Encounter  Procedures   Basic Metabolic Panel (BMET)   Hemoglobin A1c    Requested Prescriptions   Signed Prescriptions Disp Refills   buPROPion (WELLBUTRIN XL) 150 MG 24 hr tablet 90 tablet 0    Sig: Take 1 tablet (150 mg total) by mouth daily.   ALPRAZolam (XANAX) 0.25 MG tablet 30 tablet 0    Sig: Take 1 tablet (0.25 mg total) by mouth at bedtime as needed for anxiety.

## 2021-07-19 LAB — BASIC METABOLIC PANEL
BUN: 17 mg/dL (ref 6–23)
CO2: 27 mEq/L (ref 19–32)
Calcium: 9.4 mg/dL (ref 8.4–10.5)
Chloride: 104 mEq/L (ref 96–112)
Creatinine, Ser: 1.13 mg/dL (ref 0.40–1.20)
GFR: 51.39 mL/min — ABNORMAL LOW (ref >60.00)
Glucose, Bld: 107 mg/dL — ABNORMAL HIGH (ref 70–99)
Potassium: 5 mEq/L (ref 3.5–5.1)
Sodium: 137 mEq/L (ref 135–145)

## 2021-07-19 LAB — HEMOGLOBIN A1C: Hgb A1c MFr Bld: 5.1 % (ref 4.6–6.5)

## 2021-07-19 NOTE — Telephone Encounter (Signed)
Patient scheduled for 9am on 08/24/21

## 2021-07-20 ENCOUNTER — Other Ambulatory Visit (HOSPITAL_BASED_OUTPATIENT_CLINIC_OR_DEPARTMENT_OTHER): Payer: Self-pay

## 2021-07-20 ENCOUNTER — Telehealth: Payer: Self-pay | Admitting: Family Medicine

## 2021-07-20 ENCOUNTER — Other Ambulatory Visit: Payer: Self-pay

## 2021-07-20 MED ORDER — FLUTICASONE PROPIONATE 50 MCG/ACT NA SUSP
2.0000 | Freq: Every day | NASAL | 3 refills | Status: DC
Start: 2021-07-20 — End: 2021-12-05
  Filled 2021-07-20: qty 16, 30d supply, fill #0
  Filled 2021-09-20: qty 16, 30d supply, fill #1

## 2021-07-20 NOTE — Telephone Encounter (Signed)
Medication: fluticasone (FLONASE) 50 MCG/ACT nasal spray   Has the patient contacted their pharmacy? Yes.   (If no, request that the patient contact the pharmacy for the refill.) (If yes, when and what did the pharmacy advise?) no record of med in histiriy  Preferred Pharmacy (with phone number or street name): [facility] [place] [address]  Phone:  [phone]  Fax:  [phone]  DEA #:  [phone]  Agent: Please be advised that RX refills may take up to 3 business days. We ask that you follow-up with your pharmacy.

## 2021-07-20 NOTE — Telephone Encounter (Signed)
Medication sent.

## 2021-08-01 ENCOUNTER — Other Ambulatory Visit (HOSPITAL_BASED_OUTPATIENT_CLINIC_OR_DEPARTMENT_OTHER): Payer: Self-pay

## 2021-08-03 ENCOUNTER — Other Ambulatory Visit: Payer: Self-pay | Admitting: Family Medicine

## 2021-08-04 ENCOUNTER — Other Ambulatory Visit (HOSPITAL_BASED_OUTPATIENT_CLINIC_OR_DEPARTMENT_OTHER): Payer: Self-pay

## 2021-08-04 MED ORDER — FLUOXETINE HCL 20 MG PO CAPS
40.0000 mg | ORAL_CAPSULE | Freq: Every day | ORAL | 0 refills | Status: DC
  Filled 2021-08-04: qty 60, 30d supply, fill #0

## 2021-08-20 ENCOUNTER — Other Ambulatory Visit: Payer: Self-pay | Admitting: Family

## 2021-08-21 ENCOUNTER — Other Ambulatory Visit (HOSPITAL_BASED_OUTPATIENT_CLINIC_OR_DEPARTMENT_OTHER): Payer: Self-pay

## 2021-08-21 MED ORDER — ALPRAZOLAM 0.25 MG PO TABS
0.2500 mg | ORAL_TABLET | Freq: Every evening | ORAL | 0 refills | Status: DC | PRN
  Filled 2021-08-21: qty 30, 30d supply, fill #0

## 2021-08-21 NOTE — Telephone Encounter (Signed)
Requesting: alprazolam 0.'25mg'$  Contract: 02/04/2018 UDS: 02/04/2018 Last Visit: 07/18/2021 w/ Mickel Baas Next Visit: 08/24/2021 Last Refill: 07/18/2021 #30 and 0RF  Please Advise

## 2021-08-24 ENCOUNTER — Ambulatory Visit (INDEPENDENT_AMBULATORY_CARE_PROVIDER_SITE_OTHER): Payer: BC Managed Care – PPO | Admitting: Family Medicine

## 2021-08-24 ENCOUNTER — Other Ambulatory Visit: Payer: Self-pay

## 2021-08-24 ENCOUNTER — Other Ambulatory Visit (HOSPITAL_BASED_OUTPATIENT_CLINIC_OR_DEPARTMENT_OTHER): Payer: Self-pay

## 2021-08-24 VITALS — BP 105/45 | HR 64 | Temp 98.0°F | Resp 12 | Ht 68.0 in | Wt 189.4 lb

## 2021-08-24 DIAGNOSIS — G4700 Insomnia, unspecified: Secondary | ICD-10-CM

## 2021-08-24 DIAGNOSIS — Z23 Encounter for immunization: Secondary | ICD-10-CM

## 2021-08-24 DIAGNOSIS — F418 Other specified anxiety disorders: Secondary | ICD-10-CM

## 2021-08-24 DIAGNOSIS — E663 Overweight: Secondary | ICD-10-CM | POA: Diagnosis not present

## 2021-08-24 MED ORDER — FLUOXETINE HCL 20 MG PO CAPS
60.0000 mg | ORAL_CAPSULE | Freq: Every day | ORAL | 5 refills | Status: DC
  Filled 2021-08-24: qty 90, 30d supply, fill #0
  Filled 2021-10-10: qty 90, 30d supply, fill #1
  Filled 2021-11-15: qty 90, 30d supply, fill #2

## 2021-08-24 NOTE — Progress Notes (Signed)
[name], female    DOB: May 31, 1956  Age: 65 y.o. MRN: [id number]    Subjective:   Chief Complaint  Patient presents with   Follow-up   Subjective   HPI Laura Bender presents for office visit today for follow up on depression and Anxiety. She reports that she has had a tough year as her father passed away last 05/12/21 and is planning on retiring by next January 2023. She was px Wellbutrin but she did not tolerate it well because it caused her trouble with sleep and increased anxiety. Side effects subsided once she stopped taking it. She has been on Fluoxetine since the age of 65 y/o. She endorses taking her Alprazolam and trazodone at night to help her with sleep. Denies CP/palp/SOB/HA/congestion/fevers/GI or GU c/o. Taking meds as prescribed.  Short-staffed in work which is adding to her stress. She is moving to [employer] in [place] after retiring. She took the first 2 doses of covid shots.   Review of Systems  Constitutional:  Negative for chills, fatigue and fever.  HENT:  Negative for congestion, rhinorrhea, sinus pressure, sinus pain and sore throat.   Eyes:  Negative for pain.  Respiratory:  Negative for cough and shortness of breath.   Cardiovascular:  Negative for chest pain, palpitations and leg swelling.  Gastrointestinal:  Negative for abdominal pain, blood in stool, diarrhea, nausea and vomiting.  Genitourinary:  Negative for decreased urine volume, flank pain, frequency, vaginal bleeding and vaginal discharge.  Musculoskeletal:  Negative for back pain.  Neurological:  Negative for headaches.   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 meds   Depression with anxiety 04/17/2013   ETOH abuse    Fibromyalgia 03-04-12   Sporadic pain   GERD  (gastroesophageal reflux disease) 08/17/2013   History of transfusion of platelets X 1   "related to ITP"   Hyperlipidemia, mixed 10/16/2015   ITP (idiopathic thrombocytopenic purpura) 03-04-12   Dx.  '95- blow normal levels, but stable.   Lipoma 03-04-12   Rt. back about scapular ares   Migraine    "that's what the dr thinks I had yesterday; still unsure; never had one before" (06/19/2016)   Overweight 03/06/2015   PONV (postoperative nausea and vomiting)    Preventative health care 04/17/2013; 08/17/2013   Solitary pulmonary nodule 08/17/2013   LLL seen on CT in March 2014    She has a past surgical history that includes Splenectomy, total (1995); Irrigation and debridement abscess (~ 2005 X 2); Parotid gland tumor excision (Left, ~ 2000); Mass excision (03/10/2012); Colonoscopy (2008); Cholecystectomy (N/A, 04/24/2013); Appendectomy (1995); Total abdominal hysterectomy (1995); Breast biopsy (Left, 2012); and Lipoma excision (~ 2016/05/12).   Her family history includes Atrial fibrillation in her father; COPD in her sister; Cancer in her paternal grandfather and sister; Depression in her sister; Diabetes in her mother; Heart attack (age of onset: 77) in her maternal grandfather; Heart attack (age of onset: 42) in her paternal grandmother; Heart disease in her maternal grandfather, maternal grandmother, paternal grandfather, and paternal grandmother; Hyperlipidemia in her sister; Migraines in her sister.She reports that she quit smoking about 38 years ago. Her smoking use included cigarettes. She has a 3.75 pack-year smoking history. She has never used smokeless tobacco. She reports  current alcohol use of about 12.0 standard drinks per week. She reports that she does not use drugs.  Current Outpatient Medications on File Prior to Visit  Medication Sig Dispense Refill   acetaminophen (TYLENOL) 500 MG tablet Take 1,000 mg by mouth every 6 (six) hours as needed for pain.     ALPRAZolam (XANAX) 0.25 MG tablet  Take 1 tablet (0.25 mg total) by mouth at bedtime as needed for anxiety. 30 tablet 0   Calcium Carb-Cholecalciferol (CALCIUM 600 + D PO) Take 1 tablet by mouth daily.     cetirizine (ZYRTEC) 10 MG tablet Take 1 tablet (10 mg total) by mouth 2 (two) times daily as needed for allergies. 60 tablet 3   Cholecalciferol (VITAMIN D) 2000 units CAPS Take 2,000 Units by mouth daily.     docusate sodium (COLACE) 100 MG capsule Take 100 mg by mouth daily.     famotidine (PEPCID) 40 MG tablet Take 1 tablet (40 mg total) by mouth at bedtime. 30 tablet 5   fluticasone (FLONASE) 50 MCG/ACT nasal spray Place 2 sprays into both nostrils daily. 16 g 3   omeprazole (PRILOSEC) 20 MG capsule Take 1 capsule (20 mg total) by mouth daily. 90 capsule 1   topiramate (TOPAMAX) 25 MG tablet Take 1 tablet (25 mg total) by mouth daily. 90 tablet 1   traZODone (DESYREL) 50 MG tablet TAKE 2 TABLETS (100 MG TOTAL) BY MOUTH AT BEDTIME AS NEEDED FOR SLEEP. 180 tablet 1   No current facility-administered medications on file prior to visit.     Objective:  Objective  Physical Exam Constitutional:      General: She is not in acute distress.    Appearance: Normal appearance. She is not ill-appearing or toxic-appearing.  HENT:     Head: Normocephalic and atraumatic.     Right Ear: Tympanic membrane, ear canal and external ear normal.     Left Ear: Tympanic membrane, ear canal and external ear normal.     Nose: No congestion or rhinorrhea.  Eyes:     Extraocular Movements: Extraocular movements intact.     Pupils: Pupils are equal, round, and reactive to light.  Cardiovascular:     Rate and Rhythm: Normal rate and regular rhythm.     Pulses: Normal pulses.     Heart sounds: Normal heart sounds. No murmur heard. Pulmonary:     Effort: Pulmonary effort is normal. No respiratory distress.     Breath sounds: Normal breath sounds. No wheezing, rhonchi or rales.  Abdominal:     General: Bowel sounds are normal.      Palpations: Abdomen is soft. There is no mass.     Tenderness: There is no abdominal tenderness. There is no guarding.     Hernia: No hernia is present.  Musculoskeletal:        General: Normal range of motion.     Cervical back: Normal range of motion and neck supple.  Skin:    General: Skin is warm and dry.  Neurological:     Mental Status: She is alert and oriented to person, place, and time.  Psychiatric:        Behavior: Behavior normal.   BP (!) 105/45 (BP Location: Left Arm, Cuff Size: Large)   Pulse 64   Temp 98 F (36.7 C) (Oral)   Resp 12   Ht 5\' 8"  (1.727 m)   Wt 189 lb 6.4 oz (85.9 kg)   SpO2 100%   BMI 28.80 kg/m  Wt Readings from Last 3 Encounters:  08/24/21 189 lb 6.4 oz (85.9 kg)  07/18/21 188 lb 9.6 oz (85.5 kg)  08/22/20 188 lb 3.2 oz (85.4 kg)     Lab Results  Component Value Date   WBC 4.4 03/03/2021   HGB 13.3 03/03/2021   HCT 40.0 03/03/2021   PLT 71.0 (L) 03/03/2021   GLUCOSE 107 (H) 07/18/2021   CHOL 205 (H) 03/03/2021   TRIG 105.0 03/03/2021   HDL 63.20 03/03/2021   LDLDIRECT 114.0 01/19/2020   LDLCALC 121 (H) 03/03/2021   ALT 13 03/03/2021   AST 17 03/03/2021   NA 137 07/18/2021   K 5.0 07/18/2021   CL 104 07/18/2021   CREATININE 1.13 07/18/2021   BUN 17 07/18/2021   CO2 27 07/18/2021   TSH 1.57 03/03/2021   INR 0.89 05/14/2018   HGBA1C 5.1 07/18/2021    MM 3D SCREEN BREAST BILATERAL  Result Date: 03/10/2021 CLINICAL DATA:  Screening. EXAM: DIGITAL SCREENING BILATERAL MAMMOGRAM WITH TOMOSYNTHESIS AND CAD TECHNIQUE: Bilateral screening digital craniocaudal and mediolateral oblique mammograms were obtained. Bilateral screening digital breast tomosynthesis was performed. The images were evaluated with computer-aided detection. COMPARISON:  Previous exam(s). ACR Breast Density Category b: There are scattered areas of fibroglandular density. FINDINGS: There are no findings suspicious for malignancy. The images were evaluated with  computer-aided detection. IMPRESSION: No mammographic evidence of malignancy. A result letter of this screening mammogram will be mailed directly to the patient. RECOMMENDATION: Screening mammogram in one year. (Code:SM-B-01Y) BI-RADS CATEGORY  1: Negative. Electronically Signed   By: Claudie Revering M.D.   On: 03/10/2021 13:19     Assessment & Plan:  Plan    Meds ordered this encounter  Medications   FLUoxetine (PROZAC) 20 MG capsule    Sig: Take 3 capsules (60 mg total) by mouth daily.    Dispense:  90 capsule    Refill:  5     Problem List Items Addressed This Visit     Depression with anxiety    She has had a rough several months and that has flared her anxiety and depression. Her dad died in 04-25-2023. She is planning for retirement in January of 2023 and she is short staffed at work. She was started on Wellbutrin but that flared her anxiety so she stopped it. Will try increasing to Prozac 60 mg from 40 mg daily and reevaluate. Case and plan of care for 30 minutes      Relevant Medications   FLUoxetine (PROZAC) 20 MG capsule   Overweight    Given flu shot today, encouraged to take next COVID booster.       Insomnia    Encouraged good sleep hygiene such as dark, quiet room. No blue/green glowing lights such as computer screens in bedroom. No alcohol or stimulants in evening. Cut down on caffeine as able. Regular exercise is helpful but not just prior to bed time. Is able to fall to sleep with Trazodone but then wakes up in middle of night and has trouble falling back to sleep. Does not want to increase dose yet and risk feeling sedated in am      Other Visit Diagnoses     Influenza vaccine administered    -  Primary   Relevant Orders   Flu Vaccine QUAD 36+ mos IM (Fluarix, Fluzone & Afluria Quad PF (Completed)       Follow-up: Return in about 12 weeks (around 11/16/2021), or VV.  I, Suezanne Jacquet, acting as a  scribe for Penni Homans, MD, have documented all relevent documentation  on behalf of Penni Homans, MD, as directed by Penni Homans, MD while in the presence of Penni Homans, MD. DO:08/24/21.  I, [name], MD personally performed the services described in this documentation. All medical record entries made by the scribe were at my direction and in my presence. I have reviewed the chart and agree that the record reflects my personal performance and is accurate and complete

## 2021-08-24 NOTE — Assessment & Plan Note (Signed)
Encouraged good sleep hygiene such as dark, quiet room. No blue/green glowing lights such as computer screens in bedroom. No alcohol or stimulants in evening. Cut down on caffeine as able. Regular exercise is helpful but not just prior to bed time. Is able to fall to sleep with Trazodone but then wakes up in middle of night and has trouble falling back to sleep. Does not want to increase dose yet and risk feeling sedated in am

## 2021-08-24 NOTE — Assessment & Plan Note (Signed)
She has had a rough several months and that has flared her anxiety and depression. Her dad died in May 06, 2023. She is planning for retirement in January of 2023 and she is short staffed at work. She was started on Wellbutrin but that flared her anxiety so she stopped it. Will try increasing to Prozac 60 mg from 40 mg daily and reevaluate. Case and plan of care for 30 minutes

## 2021-08-24 NOTE — Patient Instructions (Addendum)
Shingrix is the new shingles shot, 2 shots over 2-6 months, confirm coverage with insurance and document, then can return here for shots with nurse appt or at pharmacy    Paxlovid and Molnupiravir is the new COVID medication we can give you if you get COVID so make sure you test if you have symptoms because we have to treat by day 5 of symptoms for it to be effective. If you are positive let us know so we can treat. If a home test is negative and your symptoms are persistent get a PCR test. Can check testing locations at [email or web address] If you are positive we will make an appointment with [place] and we will send in Paxlovid if you would like it. Check with your pharmacy before we meet to confirm they have it in stock, if they do not then we can get the prescription at the [facility], Adult Generalized anxiety disorder (GAD) is a mental health condition. Unlike normal worries, anxiety related to GAD is not triggered by a specific event. These worries do not fade or get better with time. GAD interferes with relationships, work, and school. GAD symptoms can vary from mild to severe. People with severe GAD can have intense waves of anxiety with physical symptoms that are similar to panic attacks. What are the causes? The exact cause of GAD is not known, but the following are believed to have an impact: Differences in natural brain chemicals. Genes passed down from parents to children. Differences in the way threats are perceived. Development during childhood. Personality. What increases the risk? The following factors may make you more likely to develop this condition: Being female. Having a family history of anxiety disorders. Being very shy. Experiencing very stressful life events, such as the death of a loved one. Having a very stressful family environment. What are the signs or symptoms? People with GAD often worry excessively about many things  in their lives, such as their health and family. Symptoms may also include: Mental and emotional symptoms: Worrying excessively about natural disasters. Fear of being late. Difficulty concentrating. Fears that others are judging your performance. Physical symptoms: Fatigue. Headaches, muscle tension, muscle twitches, trembling, or feeling shaky. Feeling like your heart is pounding or beating very fast. Feeling out of breath or like you cannot take a deep breath. Having trouble falling asleep or staying asleep, or experiencing restlessness. Sweating. Nausea, diarrhea, or irritable bowel syndrome (IBS). Behavioral symptoms: Experiencing erratic moods or irritability. Avoidance of new situations. Avoidance of people. Extreme difficulty making decisions. How is this diagnosed? This condition is diagnosed based on your symptoms and medical history. You will also have a physical exam. Your health care provider may perform tests to rule out other possible causes of your symptoms. To be diagnosed with GAD, a person must have anxiety that: Is out of his or her control. Affects several different aspects of his or her life, such as work and relationships. Causes distress that makes him or her unable to take part in normal activities. Includes at least three symptoms of GAD, such as restlessness, fatigue, trouble concentrating, irritability, muscle tension, or sleep problems. Before your health care provider can confirm a diagnosis of GAD, these symptoms must be present more days than they are not, and they must last for 6 months or longer. How is this treated? This condition may be treated with: Medicine. Antidepressant medicine is usually prescribed for long-term daily control. Anti-anxiety medicines may be  added in severe cases, especially when panic attacks occur. Talk therapy (psychotherapy). Certain types of talk therapy can be helpful in treating GAD by providing support, education, and  guidance. Options include: Cognitive behavioral therapy (CBT). People learn coping skills and self-calming techniques to ease their physical symptoms. They learn to identify unrealistic thoughts and behaviors and to replace them with more appropriate thoughts and behaviors. Acceptance and commitment therapy (ACT). This treatment teaches people how to be mindful as a way to cope with unwanted thoughts and feelings. Biofeedback. This process trains you to manage your body's response (physiological response) through breathing techniques and relaxation methods. You will work with a therapist while machines are used to monitor your physical symptoms. Stress management techniques. These include yoga, meditation, and exercise. A mental health specialist can help determine which treatment is best for you. Some people see improvement with one type of therapy. However, other people require a combination of therapies. Follow these instructions at home: Lifestyle Maintain a consistent routine and schedule. Anticipate stressful situations. Create a plan, and allow extra time to work with your plan. Practice stress management or self-calming techniques that you have learned from your therapist or your health care provider. General instructions Take over-the-counter and prescription medicines only as told by your health care provider. Understand that you are likely to have setbacks. Accept this and be kind to yourself as you persist to take better care of yourself. Recognize and accept your accomplishments, even if you judge them as small. Keep all follow-up visits as told by your health care provider. This is important. Contact a health care provider if: Your symptoms do not get better. Your symptoms get worse. You have signs of depression, such as: A persistently sad or irritable mood. Loss of enjoyment in activities that used to bring you joy. Change in weight or eating. Changes in sleeping  habits. Avoiding friends or family members. Loss of energy for normal tasks. Feelings of guilt or worthlessness. Get help right away if: You have serious thoughts about hurting yourself or others. If you ever feel like you may hurt yourself or others, or have thoughts about taking your own life, get help right away. Go to your nearest emergency department or: Call your local emergency services (911 in the [place] Call a suicide crisis helpline, such as the [facility] at 530-771-4728. This is open 24 hours a day in the [place] Text the Crisis Text Line at (947)412-0807 (in the [place].). Summary Generalized anxiety disorder (GAD) is a mental health condition that involves worry that is not triggered by a specific event. People with GAD often worry excessively about many things in their lives, such as their health and family. GAD may cause symptoms such as restlessness, trouble concentrating, sleep problems, frequent sweating, nausea, diarrhea, headaches, and trembling or muscle twitching. A mental health specialist can help determine which treatment is best for you. Some people see improvement with one type of therapy. However, other people require a combination of therapies. This information is not intended to replace advice given to you by your health care provider. Make sure you discuss any questions you have with your health care provider. Document Revised: 09/09/2019 Document Reviewed: 09/09/2019 Elsevier Patient Education  [place].

## 2021-08-24 NOTE — Assessment & Plan Note (Signed)
Given flu shot today, encouraged to take next COVID booster.

## 2021-09-20 ENCOUNTER — Other Ambulatory Visit (HOSPITAL_BASED_OUTPATIENT_CLINIC_OR_DEPARTMENT_OTHER): Payer: Self-pay

## 2021-09-20 ENCOUNTER — Other Ambulatory Visit: Payer: Self-pay | Admitting: Family Medicine

## 2021-09-20 MED ORDER — ALPRAZOLAM 0.25 MG PO TABS
0.2500 mg | ORAL_TABLET | Freq: Every evening | ORAL | 0 refills | Status: DC | PRN
  Filled 2021-09-20: qty 30, 30d supply, fill #0

## 2021-09-20 NOTE — Telephone Encounter (Signed)
Requesting: xanax 0.25 mg Contract:unknown UDS: unknown Last Visit:08/24/21 Next Visit:12/05/21 Last Refill:08/24/21  Please Advise

## 2021-09-21 ENCOUNTER — Other Ambulatory Visit (HOSPITAL_BASED_OUTPATIENT_CLINIC_OR_DEPARTMENT_OTHER): Payer: Self-pay

## 2021-10-10 ENCOUNTER — Other Ambulatory Visit (HOSPITAL_BASED_OUTPATIENT_CLINIC_OR_DEPARTMENT_OTHER): Payer: Self-pay

## 2021-10-18 ENCOUNTER — Other Ambulatory Visit: Payer: Self-pay | Admitting: Family Medicine

## 2021-10-19 ENCOUNTER — Other Ambulatory Visit (HOSPITAL_BASED_OUTPATIENT_CLINIC_OR_DEPARTMENT_OTHER): Payer: Self-pay

## 2021-10-19 MED ORDER — ALPRAZOLAM 0.25 MG PO TABS
0.2500 mg | ORAL_TABLET | Freq: Every evening | ORAL | 0 refills | Status: DC | PRN
  Filled 2021-10-19: qty 30, 30d supply, fill #0

## 2021-10-19 NOTE — Telephone Encounter (Signed)
Requesting: alprazolam 0.25mg  Contract: 02/04/2018 UDS: 02/04/2018 Last Visit: 08/24/2021 Next Visit: 12/05/2021 Last Refill: 09/20/2021 #30 and 0RF  Please Advise

## 2021-10-29 ENCOUNTER — Other Ambulatory Visit: Payer: Self-pay | Admitting: Family Medicine

## 2021-10-30 ENCOUNTER — Other Ambulatory Visit (HOSPITAL_BASED_OUTPATIENT_CLINIC_OR_DEPARTMENT_OTHER): Payer: Self-pay

## 2021-10-30 MED ORDER — TRAZODONE HCL 50 MG PO TABS
ORAL_TABLET | ORAL | 1 refills | Status: DC
  Filled 2021-10-30: qty 180, 90d supply, fill #0

## 2021-11-15 ENCOUNTER — Other Ambulatory Visit: Payer: Self-pay | Admitting: Family Medicine

## 2021-11-15 ENCOUNTER — Other Ambulatory Visit (HOSPITAL_BASED_OUTPATIENT_CLINIC_OR_DEPARTMENT_OTHER): Payer: Self-pay

## 2021-11-15 MED ORDER — ALPRAZOLAM 0.25 MG PO TABS
0.2500 mg | ORAL_TABLET | Freq: Every evening | ORAL | 1 refills | Status: DC | PRN
  Filled 2021-11-15: qty 30, 30d supply, fill #0

## 2021-11-15 NOTE — Telephone Encounter (Signed)
Requesting: alprazolam Contract: 02/04/18 UDS: 02/04/18 Last Visit: 07/18/21 Next Visit: 12/05/21 Last Refill: 10/19/21  Please Advise

## 2021-12-05 ENCOUNTER — Telehealth (INDEPENDENT_AMBULATORY_CARE_PROVIDER_SITE_OTHER): Payer: Medicare Other | Admitting: Family Medicine

## 2021-12-05 ENCOUNTER — Other Ambulatory Visit: Payer: Self-pay

## 2021-12-05 ENCOUNTER — Encounter: Payer: Self-pay | Admitting: Family Medicine

## 2021-12-05 VITALS — BP 144/89 | HR 66 | Ht 68.0 in | Wt 190.0 lb

## 2021-12-05 DIAGNOSIS — R7309 Other abnormal glucose: Secondary | ICD-10-CM | POA: Diagnosis not present

## 2021-12-05 DIAGNOSIS — D693 Immune thrombocytopenic purpura: Secondary | ICD-10-CM

## 2021-12-05 DIAGNOSIS — F418 Other specified anxiety disorders: Secondary | ICD-10-CM | POA: Diagnosis not present

## 2021-12-05 DIAGNOSIS — T7840XA Allergy, unspecified, initial encounter: Secondary | ICD-10-CM

## 2021-12-05 DIAGNOSIS — E782 Mixed hyperlipidemia: Secondary | ICD-10-CM

## 2021-12-05 DIAGNOSIS — G4700 Insomnia, unspecified: Secondary | ICD-10-CM

## 2021-12-05 DIAGNOSIS — K219 Gastro-esophageal reflux disease without esophagitis: Secondary | ICD-10-CM | POA: Diagnosis not present

## 2021-12-05 MED ORDER — TRAZODONE HCL 50 MG PO TABS: ORAL_TABLET | ORAL | 1 refills | Status: DC

## 2021-12-05 MED ORDER — TOPIRAMATE 25 MG PO TABS: 25.0000 mg | ORAL_TABLET | Freq: Every day | ORAL | 1 refills | Status: DC

## 2021-12-05 MED ORDER — FLUTICASONE PROPIONATE 50 MCG/ACT NA SUSP: 1.0000 | Freq: Every day | NASAL | 1 refills | Status: DC

## 2021-12-05 MED ORDER — ALPRAZOLAM 0.25 MG PO TABS: 0.2500 mg | ORAL_TABLET | Freq: Every evening | ORAL | 5 refills | Status: DC | PRN

## 2021-12-05 MED ORDER — FLUOXETINE HCL 20 MG PO CAPS: 60.0000 mg | ORAL_CAPSULE | Freq: Every day | ORAL | 1 refills | Status: DC

## 2021-12-05 NOTE — Assessment & Plan Note (Signed)
Encourage heart healthy diet such as MIND or DASH diet, increase exercise, avoid trans fats, simple carbohydrates and processed foods, consider a krill or fish or flaxseed oil cap daily.  °

## 2021-12-05 NOTE — Assessment & Plan Note (Signed)
Encouraged good sleep hygiene such as dark, quiet room. No blue/green glowing lights such as computer screens in bedroom. No alcohol or stimulants in evening. Cut down on caffeine as able. Regular exercise is helpful but not just prior to bed time. Trazodone is helping, will continue, refills given

## 2021-12-05 NOTE — Progress Notes (Signed)
MyChart Video Visit    Virtual Visit via Video Note   This visit type was conducted due to national recommendations for restrictions regarding the COVID-19 Pandemic (e.g. social distancing) in an effort to limit this patient's exposure and mitigate transmission in our community. This patient is at least at moderate risk for complications without adequate follow up. This format is felt to be most appropriate for this patient at this time. Physical exam was limited by quality of the video and audio technology used for the visit. S richardson, CMA was able to get the patient set up on a video visit.  Patient location: home Patient and provider in visit Provider location: Office  I discussed the limitations of evaluation and management by telemedicine and the availability of in person appointments. The patient expressed understanding and agreed to proceed.  Visit Date: 12/05/2021  Today's healthcare provider: Penni Homans, MD     Subjective:    [name], female    DOB: 1956-11-30, 66 y.o.   MRN: [id number]  Chief Complaint  Patient presents with   Follow-up    HPI Patient is in today for follow up on chronic medical concerns. She is now 3 weeks retired and has moved to [place] [facility] [place] and is feeling very well. No recent febrile illness or hospitalizations. [place] Her heartburn and anxiety are doing better with retirement. Is sleeping better with Trazodone. Denies CP/palp/SOB/HA/congestion/fevers/GI or GU c/o. 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 meds   Depression with anxiety 04/17/2013   ETOH abuse    Fibromyalgia 03-04-12   Sporadic pain   GERD (gastroesophageal reflux  disease) 08/17/2013   History of transfusion of platelets X 1   "related to ITP"   Hyperlipidemia, mixed 10/16/2015   ITP (idiopathic thrombocytopenic purpura) 03-04-12   Dx.  '95- blow normal levels, but stable.   Lipoma 03-04-12   Rt. back about scapular ares   Migraine    "that's what the dr thinks I had yesterday; still unsure; never had one before" (06/19/2016)   Overweight 03/06/2015   PONV (postoperative nausea and vomiting)    Preventative health care 04/17/2013; 08/17/2013   Solitary pulmonary nodule 08/17/2013   LLL seen on CT in March 2014    Past Surgical History:  Procedure Laterality Date   [place] Left 2012   needle biopsy(benign)-Titanium needle marker remains   CHOLECYSTECTOMY N/A 04/24/2013   Procedure: LAPAROSCOPIC CHOLECYSTECTOMY WITH INTRAOPERATIVE CHOLANGIOGRAM;  Surgeon: Haywood Lasso, MD;  Location: [facility];  Service: General;  Laterality: N/A;   COLONOSCOPY  2008   WNL, Dr Buccini   IRRIGATION AND DEBRIDEMENT ABSCESS  ~ 2005 X 2   buttocks   LIPOMA EXCISION  ~ 04/2016   "base of my neck"   MASS EXCISION  03/10/2012   Procedure: EXCISION MASS;  Surgeon: Odis Hollingshead, MD;  Location: [facility];  Service: General;  Laterality: N/A;  Removal of back lipoma   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   SPLENECTOMY, TOTAL  1995   TOTAL ABDOMINAL HYSTERECTOMY  1995   total, endometriosis, fibroid, ovarian atrophy  Family History  Problem Relation Age of Onset   Heart disease Maternal Grandmother    Heart disease Maternal Grandfather    Heart attack Maternal Grandfather 67   Heart disease Paternal Grandmother    Heart attack Paternal Grandmother 12   Heart disease Paternal Grandfather    Cancer Paternal Grandfather        chest- smoker   Diabetes Mother        type 2- controlled by diet   Atrial fibrillation Father    Hyperlipidemia Sister    Migraines Sister    Cancer Sister        pancreatic   Depression Sister     COPD Sister     Social History   Socioeconomic History   Marital status: Single    Spouse name: Not on file   Number of children: 0   Years of education: 12   Highest education level: Not on file  Occupational History   Not on file  Tobacco Use   Smoking status: Former    Packs/day: 0.75    Years: 5.00    Pack years: 3.75    Types: Cigarettes    Quit date: 02/21/1983    Years since quitting: 38.8   Smokeless tobacco: Never  Substance and Sexual Activity   Alcohol use: Yes    Alcohol/week: 12.0 standard drinks    Types: 12 Cans of beer per week   Drug use: No   Sexual activity: Never    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   Social Determinants of Health   Financial Resource Strain: Not on file  Food Insecurity: Not on file  Transportation Needs: Not on file  Physical Activity: Not on file  Stress: Not on file  Social Connections: Not on file  Intimate Partner Violence: Not on file    Outpatient Medications Prior to Visit  Medication Sig Dispense Refill   acetaminophen (TYLENOL) 500 MG tablet Take 1,000 mg by mouth every 6 (six) hours as needed for pain.     Calcium Carb-Cholecalciferol (CALCIUM 600 + D PO) Take 1 tablet by mouth daily.     cetirizine (ZYRTEC) 10 MG tablet Take 1 tablet (10 mg total) by mouth 2 (two) times daily as needed for allergies. 60 tablet 3   Cholecalciferol (VITAMIN D) 2000 units CAPS Take 2,000 Units by mouth daily.     docusate sodium (COLACE) 100 MG capsule Take 100 mg by mouth daily.     omeprazole (PRILOSEC) 20 MG capsule Take 1 capsule (20 mg total) by mouth daily. 90 capsule 1   ALPRAZolam (XANAX) 0.25 MG tablet Take 1 tablet (0.25 mg total) by mouth at bedtime as needed for anxiety. 30 tablet 1    famotidine (PEPCID) 40 MG tablet Take 1 tablet (40 mg total) by mouth at bedtime. 30 tablet 5   FLUoxetine (PROZAC) 20 MG capsule Take 3 capsules (60 mg total) by mouth daily. 90 capsule 5   fluticasone (FLONASE) 50 MCG/ACT nasal spray Place 2 sprays into both nostrils daily. 16 g 3   topiramate (TOPAMAX) 25 MG tablet Take 1 tablet (25 mg total) by mouth daily. 90 tablet 1   traZODone (DESYREL) 50 MG tablet TAKE 2  TABLETS (100 MG TOTAL) BY MOUTH AT BEDTIME AS NEEDED FOR SLEEP. 180 tablet 1   No facility-administered medications prior to visit.    Allergies  Allergen Reactions   Atorvastatin Other (See Comments)    Joint pain and weight gain   Codeine Nausea And Vomiting   Wellbutrin [Bupropion] Anxiety    Insomnia     Review of Systems  Constitutional:  Negative for fever and malaise/fatigue.  HENT:  Negative for congestion.   Eyes:  Negative for blurred vision.  Respiratory:  Negative for shortness of breath.   Cardiovascular:  Negative for chest pain, palpitations and leg swelling.  Gastrointestinal:  Negative for abdominal pain, blood in stool and nausea.  Genitourinary:  Negative for dysuria and frequency.  Musculoskeletal:  Negative for falls.  Skin:  Negative for rash.  Neurological:  Negative for dizziness, loss of consciousness and headaches.  Endo/Heme/Allergies:  Negative for environmental allergies.  Psychiatric/Behavioral:  Negative for depression. The patient is not nervous/anxious.       Objective:    Physical Exam Constitutional:      General: She is not in acute distress.    Appearance: Normal appearance. She is not ill-appearing or toxic-appearing.  HENT:     Head: Normocephalic and atraumatic.     Right Ear: External ear normal.     Left Ear: External ear normal.     Nose: Nose normal.  Eyes:     General:        Right eye: No discharge.        Left eye: No discharge.  Pulmonary:     Effort: Pulmonary effort is normal.  Skin:    Findings: No  rash.  Neurological:     Mental Status: She is alert and oriented to person, place, and time.  Psychiatric:        Behavior: Behavior normal.    BP (!) 144/89    Pulse 66    Ht 5\' 8"  (1.727 m)    Wt 190 lb (86.2 kg)    BMI 28.89 kg/m  Wt Readings from Last 3 Encounters:  12/05/21 190 lb (86.2 kg)  08/24/21 189 lb 6.4 oz (85.9 kg)  07/18/21 188 lb 9.6 oz (85.5 kg)    Diabetic Foot Exam - Simple   No data filed    Lab Results  Component Value Date   WBC 4.4 03/03/2021   HGB 13.3 03/03/2021   HCT 40.0 03/03/2021   PLT 71.0 (L) 03/03/2021   GLUCOSE 107 (H) 07/18/2021   CHOL 205 (H) 03/03/2021   TRIG 105.0 03/03/2021   HDL 63.20 03/03/2021   LDLDIRECT 114.0 01/19/2020   LDLCALC 121 (H) 03/03/2021   ALT 13 03/03/2021   AST 17 03/03/2021   NA 137 07/18/2021   K 5.0 07/18/2021   CL 104 07/18/2021   CREATININE 1.13 07/18/2021   BUN 17 07/18/2021   CO2 27 07/18/2021   TSH 1.57 03/03/2021   INR 0.89 05/14/2018   HGBA1C 5.1 07/18/2021    Lab Results  Component Value Date   TSH 1.57 03/03/2021   Lab Results  Component Value Date   WBC 4.4 03/03/2021   HGB 13.3 03/03/2021   HCT 40.0 03/03/2021   MCV 94.3 03/03/2021   PLT 71.0 (L) 03/03/2021   Lab Results  Component Value Date   NA 137 07/18/2021   K 5.0 07/18/2021   CO2 27 07/18/2021   GLUCOSE 107 (H) 07/18/2021   BUN 17 07/18/2021   CREATININE 1.13 07/18/2021  BILITOT 0.4 03/03/2021   ALKPHOS 94 03/03/2021   AST 17 03/03/2021   ALT 13 03/03/2021   PROT 7.7 03/03/2021   ALBUMIN 4.3 03/03/2021   CALCIUM 9.4 07/18/2021   ANIONGAP 7 06/19/2016   GFR 51.39 (L) 07/18/2021   Lab Results  Component Value Date   CHOL 205 (H) 03/03/2021   Lab Results  Component Value Date   HDL 63.20 03/03/2021   Lab Results  Component Value Date   LDLCALC 121 (H) 03/03/2021   Lab Results  Component Value Date   TRIG 105.0 03/03/2021   Lab Results  Component Value Date   CHOLHDL 3 03/03/2021   Lab Results   Component Value Date   HGBA1C 5.1 07/18/2021       Assessment & Plan:   Problem List Items Addressed This Visit     Idiopathic thrombocytopenic purpura (HCC) (Chronic)    Stable and asympatomatic      Allergy    No recent flares, [place], refill given on Flonase. Has not needed Cetirizine she will keep it on her list prn for any flares      Depression with anxiety    She is now 3 weeks retired and has moved to [place] [facility] [place] and feels well. No changes today.      Relevant Medications   traZODone (DESYREL) 50 MG tablet   FLUoxetine (PROZAC) 20 MG capsule   ALPRAZolam (XANAX) 0.25 MG tablet   GERD (gastroesophageal reflux disease)    Avoid offending foods, start probiotics. Do not eat large meals in late evening and consider raising head of bed. Doing well on Omeprazole 20 mg daily she is going to buy it OTC for now      Hyperlipidemia, mixed    Encourage heart healthy diet such as MIND or DASH diet, increase exercise, avoid trans fats, simple carbohydrates and processed foods, consider a krill or fish or flaxseed oil cap daily.       Insomnia    Encouraged good sleep hygiene such as dark, quiet room. No blue/green glowing lights such as computer screens in bedroom. No alcohol or stimulants in evening. Cut down on caffeine as able. Regular exercise is helpful but not just prior to bed time. Trazodone is helping, will continue, refills given       I have discontinued Laura Bender "Laura Bender"'s famotidine. I have also changed her fluticasone. Additionally, I am having her maintain her acetaminophen, Calcium Carb-Cholecalciferol (CALCIUM 600 + D PO), docusate sodium, Vitamin D, omeprazole, cetirizine, topiramate, traZODone, FLUoxetine, and ALPRAZolam.  Meds ordered this encounter  Medications   topiramate (TOPAMAX) 25 MG tablet    Sig: Take 1 tablet (25 mg total) by mouth daily.    Dispense:  90 tablet    Refill:  1   traZODone (DESYREL) 50 MG tablet     Sig: TAKE 2 TABLETS (100 MG TOTAL) BY MOUTH AT BEDTIME AS NEEDED FOR SLEEP.    Dispense:  180 tablet    Refill:  1   FLUoxetine (PROZAC) 20 MG capsule    Sig: Take 3 capsules (60 mg total) by mouth daily.    Dispense:  90 capsule    Refill:  1   fluticasone (FLONASE) 50 MCG/ACT nasal spray    Sig: Place 1 spray into both nostrils daily.    Dispense:  48 g    Refill:  1   ALPRAZolam (XANAX) 0.25 MG tablet    Sig: Take 1 tablet (  0.25 mg total) by mouth at bedtime as needed for anxiety.    Dispense:  30 tablet    Refill:  5    I discussed the assessment and treatment plan with the patient. The patient was provided an opportunity to ask questions and all were answered. The patient agreed with the plan and demonstrated an understanding of the instructions.   The patient was advised to call back or seek an in-person evaluation if the symptoms worsen or if the condition fails to improve as anticipated.  I provided 25 minutes of face-to-face time during this encounter.   Penni Homans, MD [facility] at [facility] [phone] (phone) [phone] (fax)  [place]

## 2021-12-05 NOTE — Assessment & Plan Note (Signed)
She is now 3 weeks retired and has moved to [place] [facility] [place] and feels well. No changes today.

## 2021-12-05 NOTE — Assessment & Plan Note (Signed)
Stable and asympatomatic

## 2021-12-05 NOTE — Assessment & Plan Note (Signed)
Avoid offending foods, start probiotics. Do not eat large meals in late evening and consider raising head of bed. Doing well on Omeprazole 20 mg daily she is going to buy it OTC for now

## 2021-12-05 NOTE — Assessment & Plan Note (Signed)
No recent flares, [place], refill given on Flonase. Has not needed Cetirizine she will keep it on her list prn for any flares

## 2021-12-06 NOTE — Addendum Note (Signed)
Addended by: Kem Boroughs D on: 12/06/2021 10:41 AM   Modules accepted: Orders

## 2022-01-28 ENCOUNTER — Other Ambulatory Visit: Payer: Self-pay | Admitting: Family Medicine

## 2022-02-13 DIAGNOSIS — H524 Presbyopia: Secondary | ICD-10-CM | POA: Diagnosis not present

## 2022-03-29 ENCOUNTER — Other Ambulatory Visit: Payer: Self-pay | Admitting: Family Medicine

## 2022-04-24 ENCOUNTER — Other Ambulatory Visit (HOSPITAL_BASED_OUTPATIENT_CLINIC_OR_DEPARTMENT_OTHER): Payer: Self-pay | Admitting: Family Medicine

## 2022-04-24 DIAGNOSIS — Z1231 Encounter for screening mammogram for malignant neoplasm of breast: Secondary | ICD-10-CM

## 2022-05-21 ENCOUNTER — Other Ambulatory Visit: Payer: Medicare Other

## 2022-05-21 ENCOUNTER — Encounter (HOSPITAL_BASED_OUTPATIENT_CLINIC_OR_DEPARTMENT_OTHER): Payer: Self-pay

## 2022-05-21 ENCOUNTER — Ambulatory Visit (HOSPITAL_BASED_OUTPATIENT_CLINIC_OR_DEPARTMENT_OTHER)
Admission: RE | Admit: 2022-05-21 | Discharge: 2022-05-21 | Disposition: A | Discharge status: Home / Self Care | Payer: Medicare Other | Source: Ambulatory Visit | Attending: Family Medicine | Admitting: Family Medicine

## 2022-05-21 ENCOUNTER — Other Ambulatory Visit (INDEPENDENT_AMBULATORY_CARE_PROVIDER_SITE_OTHER): Payer: Medicare Other

## 2022-05-21 ENCOUNTER — Telehealth: Payer: Self-pay

## 2022-05-21 DIAGNOSIS — F418 Other specified anxiety disorders: Secondary | ICD-10-CM

## 2022-05-21 DIAGNOSIS — E782 Mixed hyperlipidemia: Secondary | ICD-10-CM

## 2022-05-21 DIAGNOSIS — K219 Gastro-esophageal reflux disease without esophagitis: Secondary | ICD-10-CM | POA: Diagnosis not present

## 2022-05-21 DIAGNOSIS — Z1231 Encounter for screening mammogram for malignant neoplasm of breast: Secondary | ICD-10-CM | POA: Diagnosis not present

## 2022-05-21 DIAGNOSIS — R7309 Other abnormal glucose: Secondary | ICD-10-CM

## 2022-05-21 LAB — COMPREHENSIVE METABOLIC PANEL
ALT: 13 U/L (ref 0–35)
AST: 17 U/L (ref 0–37)
Albumin: 4.1 g/dL (ref 3.5–5.2)
Alkaline Phosphatase: 91 U/L (ref 39–117)
BUN: 19 mg/dL (ref 6–23)
CO2: 28 mEq/L (ref 19–32)
Calcium: 9.5 mg/dL (ref 8.4–10.5)
Chloride: 106 mEq/L (ref 96–112)
Creatinine, Ser: 1.03 mg/dL (ref 0.40–1.20)
GFR: 57.1 mL/min — ABNORMAL LOW (ref >60.00)
Glucose, Bld: 89 mg/dL (ref 70–99)
Potassium: 4.4 mEq/L (ref 3.5–5.1)
Sodium: 137 mEq/L (ref 135–145)
Total Bilirubin: 0.4 mg/dL (ref 0.2–1.2)
Total Protein: 7.5 g/dL (ref 6.0–8.3)

## 2022-05-21 LAB — CBC
HCT: 41.2 % (ref 36.0–46.0)
Hemoglobin: 13.6 g/dL (ref 12.0–15.0)
MCHC: 33 g/dL (ref 30.0–36.0)
MCV: 96.4 fl (ref 78.0–100.0)
Platelets: 48 10*3/uL — CL (ref 150.0–400.0)
RBC: 4.27 Mil/uL (ref 3.87–5.11)
RDW: 13.6 % (ref 11.5–15.5)
WBC: 4.8 10*3/uL (ref 4.0–10.5)

## 2022-05-21 LAB — HEMOGLOBIN A1C: Hgb A1c MFr Bld: 5.2 % (ref 4.6–6.5)

## 2022-05-21 LAB — TSH: TSH: 2.49 u[IU]/mL (ref 0.35–5.50)

## 2022-05-21 NOTE — Progress Notes (Unsigned)
Subjective:    [name], female    DOB: October 23, 1956, 66 y.o.   MRN: [id number]  No chief complaint on file.   HPI Patient is in today for her annual physical exam.  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  '95- blow normal levels, but stable.   Lipoma 03-04-12   Rt. back about scapular ares   Migraine    "that's what the dr thinks I had yesterday; still unsure; never had one before" (06/19/2016)   Overweight 03/06/2015   PONV (postoperative nausea and vomiting)    Preventative health care 04/17/2013; 08/17/2013   Solitary pulmonary nodule 08/17/2013   LLL seen on CT in March 2014    Past Surgical History:  Procedure Laterality Date   [place] Left 2012   needle biopsy(benign)-Titanium needle marker remains   CHOLECYSTECTOMY N/A 04/24/2013   Procedure: LAPAROSCOPIC CHOLECYSTECTOMY WITH INTRAOPERATIVE CHOLANGIOGRAM;  Surgeon: Haywood Lasso, MD;  Location: [facility];  Service: General;  Laterality: N/A;   COLONOSCOPY  2008   WNL, Dr Buccini   IRRIGATION AND DEBRIDEMENT ABSCESS  ~ 2005 X 2   buttocks   LIPOMA EXCISION  ~ 04/2016   "base of my neck"   MASS EXCISION  03/10/2012   Procedure: EXCISION MASS;  Surgeon: Odis Hollingshead, MD;  Location: [facility];  Service: General;  Laterality: N/A;  Removal of back lipoma   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   SPLENECTOMY, TOTAL  1995   TOTAL ABDOMINAL HYSTERECTOMY  1995    total, endometriosis, fibroid, ovarian atrophy    Family History  Problem Relation Age of Onset   Heart disease Maternal Grandmother    Heart disease Maternal Grandfather    Heart attack Maternal Grandfather 62   Heart disease Paternal Grandmother    Heart attack Paternal Grandmother 86   Heart disease Paternal Grandfather    Cancer Paternal Grandfather        chest- smoker   Diabetes Mother        type 2- controlled by diet   Atrial fibrillation Father    Hyperlipidemia Sister    Migraines Sister    Cancer Sister        pancreatic   Depression Sister    COPD Sister     Social History   Socioeconomic History   Marital status: Single    Spouse name: Not on file   Number of children: 0   Years of education: 12   Highest education level: Not on file  Occupational History   Not on file  Tobacco Use   Smoking status: Former    Packs/day: 0.75    Years: 5.00    Total pack years: 3.75    Types: Cigarettes    Quit date: 02/21/1983    Years since quitting: 39.2   Smokeless tobacco: Never  Substance and Sexual Activity  Alcohol use: Yes    Alcohol/week: 12.0 standard drinks of alcohol    Types: 12 Cans of beer per week   Drug use: No   Sexual activity: Never    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   Social Determinants of Health   Financial Resource Strain: Not on file  Food Insecurity: Not on file  Transportation Needs: Not on file  Physical Activity: Not on file  Stress: Not on file  Social Connections: Not on file  Intimate Partner Violence: Not on file    Outpatient Medications Prior to Visit  Medication Sig Dispense Refill   acetaminophen (TYLENOL) 500 MG tablet Take 1,000 mg by mouth every 6 (six) hours as  needed for pain.     ALPRAZolam (XANAX) 0.25 MG tablet Take 1 tablet (0.25 mg total) by mouth at bedtime as needed for anxiety. 30 tablet 5   Calcium Carb-Cholecalciferol (CALCIUM 600 + D PO) Take 1 tablet by mouth daily.     cetirizine (ZYRTEC) 10 MG tablet Take 1 tablet (10 mg total) by mouth 2 (two) times daily as needed for allergies. 60 tablet 3   Cholecalciferol (VITAMIN D) 2000 units CAPS Take 2,000 Units by mouth daily.     docusate sodium (COLACE) 100 MG capsule Take 100 mg by mouth daily.     FLUoxetine (PROZAC) 20 MG capsule TAKE 3 CAPSULES(60 MG) BY MOUTH DAILY 90 capsule 1   fluticasone (FLONASE) 50 MCG/ACT nasal spray Place 1 spray into both nostrils daily. 48 g 1   omeprazole (PRILOSEC) 20 MG capsule Take 1 capsule (20 mg total) by mouth daily. 90 capsule 1   topiramate (TOPAMAX) 25 MG tablet Take 1 tablet (25 mg total) by mouth daily. 90 tablet 1   traZODone (DESYREL) 50 MG tablet TAKE 2 TABLETS (100 MG TOTAL) BY MOUTH AT BEDTIME AS NEEDED FOR SLEEP. 180 tablet 1   No facility-administered medications prior to visit.    Allergies  Allergen Reactions   Atorvastatin Other (See Comments)    Joint pain and weight gain   Codeine Nausea And Vomiting   Wellbutrin [Bupropion] Anxiety    Insomnia     ROS     Objective:    Physical Exam  There were no vitals taken for this visit. Wt Readings from Last 3 Encounters:  12/05/21 190 lb (86.2 kg)  08/24/21 189 lb 6.4 oz (85.9 kg)  07/18/21 188 lb 9.6 oz (85.5 kg)    Diabetic Foot Exam - Simple   No data filed    Lab Results  Component Value Date   WBC 4.4 03/03/2021   HGB 13.3 03/03/2021   HCT 40.0 03/03/2021   PLT 71.0 (L) 03/03/2021   GLUCOSE 107 (H) 07/18/2021   CHOL 205 (H) 03/03/2021   TRIG 105.0 03/03/2021   HDL 63.20 03/03/2021   LDLDIRECT 114.0 01/19/2020   LDLCALC 121 (H) 03/03/2021   ALT 13 03/03/2021   AST 17 03/03/2021   NA 137 07/18/2021   K 5.0 07/18/2021   CL 104 07/18/2021   CREATININE 1.13  07/18/2021   BUN 17 07/18/2021   CO2 27 07/18/2021   TSH 1.57 03/03/2021   INR 0.89 05/14/2018   HGBA1C 5.1 07/18/2021  Lab Results  Component Value Date   TSH 1.57 03/03/2021   Lab Results  Component Value Date   WBC 4.4 03/03/2021   HGB 13.3 03/03/2021   HCT 40.0 03/03/2021   MCV 94.3 03/03/2021   PLT 71.0 (L) 03/03/2021   Lab Results  Component Value Date   NA 137 07/18/2021   K 5.0 07/18/2021   CO2 27 07/18/2021   GLUCOSE 107 (H) 07/18/2021   BUN 17 07/18/2021   CREATININE 1.13 07/18/2021   BILITOT 0.4 03/03/2021   ALKPHOS 94 03/03/2021   AST 17 03/03/2021   ALT 13 03/03/2021   PROT 7.7 03/03/2021   ALBUMIN 4.3 03/03/2021   CALCIUM 9.4 07/18/2021   ANIONGAP 7 06/19/2016   GFR 51.39 (L) 07/18/2021   Lab Results  Component Value Date   CHOL 205 (H) 03/03/2021   Lab Results  Component Value Date   HDL 63.20 03/03/2021   Lab Results  Component Value Date   LDLCALC 121 (H) 03/03/2021   Lab Results  Component Value Date   TRIG 105.0 03/03/2021   Lab Results  Component Value Date   CHOLHDL 3 03/03/2021   Lab Results  Component Value Date   HGBA1C 5.1 07/18/2021       Assessment & Plan:   COLONOSCOPY: PAP: PSA: DEXA:   Problem List Items Addressed This Visit   None   I am having Laura Bender "Laura Bender" maintain her acetaminophen, Calcium Carb-Cholecalciferol (CALCIUM 600 + D PO), docusate sodium, Vitamin D, omeprazole, cetirizine, topiramate, traZODone, fluticasone, ALPRAZolam, and FLUoxetine.  No orders of the defined types were placed in this encounter.

## 2022-05-21 NOTE — Telephone Encounter (Signed)
Called patient, known history of thrombocytopenia.  She notes no bleeding or other symptoms.

## 2022-05-21 NOTE — Telephone Encounter (Signed)
CRITICAL VALUE STICKER  CRITICAL VALUE: 48,000 platelet count   RECEIVER (on-site recipient of call):  DATE & TIME NOTIFIED: 12:40 // 05/21/2022

## 2022-05-22 ENCOUNTER — Ambulatory Visit (INDEPENDENT_AMBULATORY_CARE_PROVIDER_SITE_OTHER): Payer: Medicare Other | Admitting: Family Medicine

## 2022-05-22 ENCOUNTER — Encounter: Payer: Self-pay | Admitting: Family Medicine

## 2022-05-22 VITALS — BP 118/72 | HR 66 | Resp 20 | Ht 68.0 in | Wt 190.0 lb

## 2022-05-22 DIAGNOSIS — R0602 Shortness of breath: Secondary | ICD-10-CM

## 2022-05-22 DIAGNOSIS — E782 Mixed hyperlipidemia: Secondary | ICD-10-CM

## 2022-05-22 DIAGNOSIS — Z780 Asymptomatic menopausal state: Secondary | ICD-10-CM

## 2022-05-22 DIAGNOSIS — Z0000 Encounter for general adult medical examination without abnormal findings: Secondary | ICD-10-CM | POA: Diagnosis not present

## 2022-05-22 DIAGNOSIS — F418 Other specified anxiety disorders: Secondary | ICD-10-CM | POA: Diagnosis not present

## 2022-05-22 DIAGNOSIS — D693 Immune thrombocytopenic purpura: Secondary | ICD-10-CM

## 2022-05-22 DIAGNOSIS — R42 Dizziness and giddiness: Secondary | ICD-10-CM

## 2022-05-22 DIAGNOSIS — R0600 Dyspnea, unspecified: Secondary | ICD-10-CM

## 2022-05-22 DIAGNOSIS — R059 Cough, unspecified: Secondary | ICD-10-CM | POA: Diagnosis not present

## 2022-05-22 DIAGNOSIS — E2839 Other primary ovarian failure: Secondary | ICD-10-CM

## 2022-05-22 MED ORDER — MECLIZINE HCL 25 MG PO TABS: 25.0000 mg | ORAL_TABLET | Freq: Three times a day (TID) | ORAL | 0 refills | Status: AC | PRN

## 2022-05-22 MED ORDER — METHYLPREDNISOLONE 4 MG PO TABS: ORAL_TABLET | ORAL | 0 refills | Status: DC

## 2022-05-22 NOTE — Patient Instructions (Addendum)
Shingrix is the new shingles shot, 2 shots over 2-6 months, confirm coverage with insurance and document, then can return here for shots with nurse appt or at pharmacy   Get [place] a copy of your advanced care planning documents  Preventive Care 65 Years and Older, Female Preventive care refers to lifestyle choices and visits with your health care provider that can promote health and wellness. Preventive care visits are also called wellness exams. What can I expect for my preventive care visit? Counseling Your health care provider may ask you questions about your: Medical history, including: Past medical problems. Family medical history. Pregnancy and menstrual history. History of falls. Current health, including: Memory and ability to understand (cognition). Emotional well-being. Home life and relationship well-being. Sexual activity and sexual health. Lifestyle, including: Alcohol, nicotine or tobacco, and drug use. Access to firearms. Diet, exercise, and sleep habits. Work and work Statistician. Sunscreen use. Safety issues such as seatbelt and bike helmet use. Physical exam Your health care provider will check your: Height and weight. These may be used to calculate your BMI (body mass index). BMI is a measurement that tells if you are at a healthy weight. Waist circumference. This measures the distance around your waistline. This measurement also tells if you are at a healthy weight and may help predict your risk of certain diseases, such as type 2 diabetes and high blood pressure. Heart rate and blood pressure. Body temperature. Skin for abnormal spots. What immunizations do I need?  Vaccines are usually given at various ages, according to a schedule. Your health care provider will recommend vaccines for you based on your age, medical history, and lifestyle or other factors, such as travel or where you work. What tests do I need? Screening Your health care provider may recommend  screening tests for certain conditions. This may include: Lipid and cholesterol levels. Hepatitis C test. Hepatitis B test. HIV (human immunodeficiency virus) test. STI (sexually transmitted infection) testing, if you are at risk. Lung cancer screening. Colorectal cancer screening. Diabetes screening. This is done by checking your blood sugar (glucose) after you have not eaten for a while (fasting). Mammogram. Talk with your health care provider about how often you should have regular mammograms. BRCA-related cancer screening. This may be done if you have a family history of breast, ovarian, tubal, or peritoneal cancers. Bone density scan. This is done to screen for osteoporosis. Talk with your health care provider about your test results, treatment options, and if necessary, the need for more tests. Follow these instructions at home: Eating and drinking  Eat a diet that includes fresh fruits and vegetables, whole grains, lean protein, and low-fat dairy products. Limit your intake of foods with high amounts of sugar, saturated fats, and salt. Take vitamin and mineral supplements as recommended by your health care provider. Do not drink alcohol if your health care provider tells you not to drink. If you drink alcohol: Limit how much you have to 0-1 drink a day. Know how much alcohol is in your drink. In the [place] one drink equals one 12 oz bottle of beer (355 mL), one 5 oz glass of wine (148 mL), or one 1 oz glass of hard liquor (44 mL). Lifestyle Brush your teeth every morning and night with fluoride toothpaste. Floss one time each day. Exercise for at least 30 minutes 5 or more days each week. Do not use any products that contain nicotine or tobacco. These products include cigarettes, chewing tobacco, and vaping devices, such as  e-cigarettes. If you need help quitting, ask your health care provider. Do not use drugs. If you are sexually active, practice safe sex. Use a condom or other  form of protection in order to prevent STIs. Take aspirin only as told by your health care provider. Make sure that you understand how much to take and what form to take. Work with your health care provider to find out whether it is safe and beneficial for you to take aspirin daily. Ask your health care provider if you need to take a cholesterol-lowering medicine (statin). Find healthy ways to manage stress, such as: Meditation, yoga, or listening to music. Journaling. Talking to a trusted person. Spending time with friends and family. Minimize exposure to UV radiation to reduce your risk of skin cancer. Safety Always wear your seat belt while driving or riding in a vehicle. Do not drive: If you have been drinking alcohol. Do not ride with someone who has been drinking. When you are tired or distracted. While texting. If you have been using any mind-altering substances or drugs. Wear a helmet and other protective equipment during sports activities. If you have firearms in your house, make sure you follow all gun safety procedures. What's next? Visit your health care provider once a year for an annual wellness visit. Ask your health care provider how often you should have your eyes and teeth checked. Stay up to date on all vaccines. This information is not intended to replace advice given to you by your health care provider. Make sure you discuss any questions you have with your health care provider. Document Revised: 05/17/2021 Document Reviewed: 05/17/2021 Elsevier Patient Education  [place].

## 2022-05-23 ENCOUNTER — Ambulatory Visit (HOSPITAL_BASED_OUTPATIENT_CLINIC_OR_DEPARTMENT_OTHER)
Admission: RE | Admit: 2022-05-23 | Discharge: 2022-05-23 | Disposition: A | Discharge status: Home / Self Care | Payer: Medicare Other | Source: Ambulatory Visit | Attending: Family Medicine | Admitting: Family Medicine

## 2022-05-23 DIAGNOSIS — R0602 Shortness of breath: Secondary | ICD-10-CM | POA: Diagnosis not present

## 2022-05-23 DIAGNOSIS — R059 Cough, unspecified: Secondary | ICD-10-CM | POA: Diagnosis not present

## 2022-05-23 LAB — ECHOCARDIOGRAM COMPLETE
AR max vel: 2.24 cm2
AV Area VTI: 2.44 cm2
AV Area mean vel: 2.3 cm2
AV Mean grad: 4 mmHg
AV Peak grad: 8.3 mmHg
Ao pk vel: 1.44 m/s
Area-P 1/2: 4.89 cm2
S' Lateral: 3 cm

## 2022-05-23 NOTE — Progress Notes (Signed)
  Echocardiogram 2D Echocardiogram has been performed.  Merrie Roof F 05/23/2022, 2:07 PM

## 2022-05-24 DIAGNOSIS — R059 Cough, unspecified: Secondary | ICD-10-CM | POA: Insufficient documentation

## 2022-05-24 DIAGNOSIS — R0600 Dyspnea, unspecified: Secondary | ICD-10-CM | POA: Insufficient documentation

## 2022-05-24 NOTE — Assessment & Plan Note (Signed)
Check CXR  

## 2022-05-24 NOTE — Assessment & Plan Note (Signed)
Given Medrol dosepak and Meclizine to use prn. Stay well hydrate

## 2022-05-24 NOTE — Assessment & Plan Note (Signed)
Encourage heart healthy diet such as MIND or DASH diet, increase exercise, avoid trans fats, simple carbohydrates and processed foods, consider a krill or fish or flaxseed oil cap daily.  °

## 2022-05-24 NOTE — Assessment & Plan Note (Addendum)
Patient encouraged to maintain heart healthy diet, regular exercise, adequate sleep. Consider daily probiotics. Take medications as prescribed. Labs reviewed. Dexa scan ordered today. Shingrix is the new shingles shot, 2 shots over 2-6 months, confirm coverage with insurance and document, then can return here for shots with nurse appt or at pharmacy

## 2022-05-24 NOTE — Assessment & Plan Note (Signed)
Has been worse since a URI last month. Check CXR and Echo. Report worsening symptoms

## 2022-05-24 NOTE — Assessment & Plan Note (Signed)
Doing well on current meds. No changes today

## 2022-05-24 NOTE — Assessment & Plan Note (Signed)
Stable and asymptomatic  

## 2022-05-28 ENCOUNTER — Other Ambulatory Visit: Payer: Self-pay

## 2022-05-28 MED ORDER — FLUOXETINE HCL 20 MG PO CAPS: ORAL_CAPSULE | ORAL | 1 refills | Status: DC

## 2022-05-28 MED ORDER — TRAZODONE HCL 50 MG PO TABS: ORAL_TABLET | ORAL | 1 refills | Status: DC

## 2022-05-28 MED ORDER — TOPIRAMATE 25 MG PO TABS
25.0000 mg | ORAL_TABLET | Freq: Every day | ORAL | 1 refills | Status: DC
Start: 2022-05-28 — End: 2022-11-22

## 2022-05-28 MED ORDER — FLUTICASONE PROPIONATE 50 MCG/ACT NA SUSP: 1.0000 | Freq: Every day | NASAL | 1 refills | Status: AC

## 2022-06-14 ENCOUNTER — Other Ambulatory Visit: Payer: Self-pay

## 2022-06-14 ENCOUNTER — Encounter: Payer: Self-pay | Admitting: Family Medicine

## 2022-06-14 MED ORDER — CETIRIZINE HCL 10 MG PO TABS
10.0000 mg | ORAL_TABLET | Freq: Two times a day (BID) | ORAL | 3 refills | Status: DC | PRN
Start: 2022-06-14 — End: 2022-10-09

## 2022-06-14 MED ORDER — ALPRAZOLAM 0.25 MG PO TABS
0.2500 mg | ORAL_TABLET | Freq: Every evening | ORAL | 5 refills | Status: DC | PRN
Start: 2022-06-14 — End: 2022-11-22

## 2022-06-14 NOTE — Telephone Encounter (Signed)
Requesting: Xanax 0.25 Contract: 2019 UDS: 2019 Last Visit: 05/22/22 Next Visit: 11/22/22 Last Refill: 12/05/2021  Please Advise

## 2022-09-03 ENCOUNTER — Telehealth (HOSPITAL_BASED_OUTPATIENT_CLINIC_OR_DEPARTMENT_OTHER): Payer: Self-pay

## 2022-09-04 ENCOUNTER — Telehealth (HOSPITAL_BASED_OUTPATIENT_CLINIC_OR_DEPARTMENT_OTHER): Payer: Self-pay

## 2022-09-18 ENCOUNTER — Other Ambulatory Visit (HOSPITAL_BASED_OUTPATIENT_CLINIC_OR_DEPARTMENT_OTHER): Payer: Self-pay

## 2022-09-18 MED ORDER — FLUAD QUADRIVALENT 0.5 ML IM PRSY
PREFILLED_SYRINGE | INTRAMUSCULAR | 0 refills | Status: DC
  Filled 2022-09-18: qty 0.5, 1d supply, fill #0

## 2022-09-18 MED ORDER — COMIRNATY 30 MCG/0.3ML IM SUSY
PREFILLED_SYRINGE | INTRAMUSCULAR | 0 refills | Status: DC
Start: 2022-09-18 — End: 2023-08-26
  Filled 2022-09-18: qty 0.3, 1d supply, fill #0

## 2022-10-09 ENCOUNTER — Other Ambulatory Visit: Payer: Self-pay | Admitting: Family Medicine

## 2022-11-21 NOTE — Assessment & Plan Note (Addendum)
Fluoxetine 20 mg daily and Alprazolam prn

## 2022-11-21 NOTE — Assessment & Plan Note (Signed)
Long standing and stable

## 2022-11-21 NOTE — Assessment & Plan Note (Signed)
Encouraged increased hydration, 64 ounces of clear fluids daily. Minimize alcohol and caffeine. Eat small frequent meals with lean proteins and complex carbs. Avoid high and low blood sugars. Get adequate sleep, 7-8 hours a night. Needs exercise daily preferably in the morning.  

## 2022-11-21 NOTE — Assessment & Plan Note (Signed)
Encourage heart healthy diet such as MIND or DASH diet, increase exercise, avoid trans fats, simple carbohydrates and processed foods, consider a krill or fish or flaxseed oil cap daily.  °

## 2022-11-22 ENCOUNTER — Other Ambulatory Visit (HOSPITAL_BASED_OUTPATIENT_CLINIC_OR_DEPARTMENT_OTHER): Payer: Medicare Other

## 2022-11-22 ENCOUNTER — Ambulatory Visit (INDEPENDENT_AMBULATORY_CARE_PROVIDER_SITE_OTHER): Payer: Medicare Other | Admitting: Family Medicine

## 2022-11-22 VITALS — BP 126/70 | HR 72 | Temp 98.0°F | Resp 16 | Ht 67.0 in | Wt 194.6 lb

## 2022-11-22 DIAGNOSIS — D693 Immune thrombocytopenic purpura: Secondary | ICD-10-CM | POA: Diagnosis not present

## 2022-11-22 DIAGNOSIS — F418 Other specified anxiety disorders: Secondary | ICD-10-CM | POA: Diagnosis not present

## 2022-11-22 DIAGNOSIS — F1010 Alcohol abuse, uncomplicated: Secondary | ICD-10-CM | POA: Diagnosis not present

## 2022-11-22 DIAGNOSIS — E782 Mixed hyperlipidemia: Secondary | ICD-10-CM

## 2022-11-22 MED ORDER — CETIRIZINE HCL 10 MG PO TABS
10.0000 mg | ORAL_TABLET | Freq: Every day | ORAL | 1 refills | Status: AC | PRN
Start: 2022-11-22 — End: ?

## 2022-11-22 MED ORDER — TOPIRAMATE 25 MG PO TABS: 25.0000 mg | ORAL_TABLET | Freq: Every day | ORAL | 1 refills | Status: DC

## 2022-11-22 MED ORDER — TRAZODONE HCL 50 MG PO TABS: ORAL_TABLET | ORAL | 1 refills | Status: DC

## 2022-11-22 MED ORDER — ALPRAZOLAM 0.25 MG PO TABS
0.2500 mg | ORAL_TABLET | Freq: Two times a day (BID) | ORAL | 5 refills | Status: DC | PRN
Start: 2022-11-22 — End: 2023-07-04

## 2022-11-22 MED ORDER — FLUOXETINE HCL 20 MG PO CAPS: ORAL_CAPSULE | ORAL | 1 refills | Status: DC

## 2022-11-22 NOTE — Progress Notes (Signed)
Subjective:   By signing my name below, I, [name], attest that this documentation has been prepared under the direction and in the presence of [name], MD., 11/22/2022.     [name], female    DOB: [birth year], 66 y.o.   MRN: [id number]  Chief Complaint  Patient presents with   Follow-up    Follow up   HPI Patient is in today for an office visit. She denies CP/palpitations/SOB/HA/congestion/fevers/GI or GU c/o.  Anxiety Patient is requesting alternative treatment to manage her increasing anxiety. She currently takes Alprazolam 0.25 mg and Fluoxetine 60 mg daily. She lives alone and says that she is battling this increasing anxiety 4 days out of the week. She describes having a racing heart rate during these anxiety episodes. She feels okay when she is around family and friends.  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  '95- blow normal levels, but stable.   Lipoma 03-04-12   Rt. back about scapular ares   Migraine    "that's what the dr thinks I had yesterday; still unsure; never had one before" (06/19/2016)   Overweight 03/06/2015   PONV (postoperative nausea and vomiting)    Preventative health care 04/17/2013; 08/17/2013   Solitary pulmonary nodule 08/17/2013   LLL seen on CT in March 2014   Past Surgical History:  Procedure Laterality Date   [place] Left 2012   needle biopsy(benign)-Titanium needle marker  remains   CHOLECYSTECTOMY N/A 04/24/2013   Procedure: LAPAROSCOPIC CHOLECYSTECTOMY WITH INTRAOPERATIVE CHOLANGIOGRAM;  Surgeon: Haywood Lasso, MD;  Location: [facility];  Service: General;  Laterality: N/A;   COLONOSCOPY  2008   WNL, Dr Buccini   IRRIGATION AND DEBRIDEMENT ABSCESS  ~ 2005 X 2   buttocks   LIPOMA EXCISION  ~ 04/2016   "base of my neck"   MASS EXCISION  03/10/2012   Procedure: EXCISION MASS;  Surgeon: Odis Hollingshead, MD;  Location: [facility];  Service: General;  Laterality: N/A;  Removal of back lipoma   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   SPLENECTOMY, TOTAL  1995   TOTAL ABDOMINAL HYSTERECTOMY  1995   total, endometriosis, fibroid, ovarian atrophy   Family History  Problem Relation Age of Onset   Heart disease Maternal Grandmother    Heart disease Maternal Grandfather    Heart attack Maternal Grandfather 62   Heart disease Paternal Grandmother    Heart attack Paternal Grandmother 86   Heart disease Paternal Grandfather    Cancer Paternal Grandfather        chest- smoker   Diabetes Mother        type 2- controlled by diet   Atrial fibrillation Father    Hyperlipidemia Sister    Migraines Sister    Cancer Sister        pancreatic   Depression Sister    COPD  Sister    Social History   Socioeconomic History   Marital status: Single    Spouse name: Not on file   Number of children: 0   Years of education: 12   Highest education level: Not on file  Occupational History   Not on file  Tobacco Use   Smoking status: Former    Packs/day: 0.75    Years: 5.00    Total pack years: 3.75    Types: Cigarettes    Quit date: 02/21/1983    Years since quitting: 39.7   Smokeless tobacco: Never  Substance and Sexual Activity   Alcohol use: Yes    Alcohol/week: 12.0 standard drinks of alcohol    Types: 12 Cans of beer per week   Drug use: No   Sexual activity: Never    Comment: works as Glass blower/designer at [employer], No major dietary  restriction. lives with partner   Other Topics Concern   Not on file  Social History Narrative   Lives at home w/ her roommate   Right-handed   Caffeine: has cut down to 1-2 cups 1/2 caf coffee in the a.m.   Social Determinants of Health   Financial Resource Strain: Not on file  Food Insecurity: Not on file  Transportation Needs: Not on file  Physical Activity: Not on file  Stress: Not on file  Social Connections: Not on file  Intimate Partner Violence: Not on file   Outpatient Medications Prior to Visit  Medication Sig Dispense Refill   acetaminophen (TYLENOL) 500 MG tablet Take 1,000 mg by mouth every 6 (six) hours as needed for pain.     Calcium Carb-Cholecalciferol (CALCIUM 600 + D PO) Take 1 tablet by mouth daily.     Cholecalciferol (VITAMIN D) 2000 units CAPS Take 2,000 Units by mouth daily.     COVID-19 mRNA vaccine 2023-2024 (COMIRNATY) syringe Inject into the muscle. 0.3 mL 0   docusate sodium (COLACE) 100 MG capsule Take 100 mg by mouth daily.     fluticasone (FLONASE) 50 MCG/ACT nasal spray Place 1 spray into both nostrils daily. 48 g 1   influenza vaccine adjuvanted (FLUAD QUADRIVALENT) 0.5 ML injection Inject into the muscle. 0.5 mL 0   meclizine (ANTIVERT) 25 MG tablet Take 1 tablet (25 mg total) by mouth 3 (three) times daily as needed for dizziness. 30 tablet 0   methylPREDNISolone (MEDROL) 4 MG tablet 6 tabs po x 1 day then5 tabs po x 1 day then 4 tabs po x 1 day then 3 tabs po x 1 day then 2 tabs po x 1 day then 1 tab po x 1 day and stop 21 tablet 0   omeprazole (PRILOSEC) 20 MG capsule Take 1 capsule (20 mg total) by mouth daily. 90 capsule 1   ALPRAZolam (XANAX) 0.25 MG tablet Take 1 tablet (0.25 mg total) by mouth at bedtime as needed for anxiety. 30 tablet 5   cetirizine (ZYRTEC) 10 MG tablet Take 1 tablet (10 mg total) by mouth daily as needed for allergies or rhinitis. 90 tablet 1   FLUoxetine (PROZAC) 20 MG capsule TAKE 3 CAPSULES(60 MG) BY MOUTH DAILY 90  capsule 1   topiramate (TOPAMAX) 25 MG tablet Take 1 tablet (25 mg total) by mouth daily. 90 tablet 1   traZODone (DESYREL) 50 MG tablet TAKE 2 TABLETS (100 MG TOTAL) BY MOUTH AT BEDTIME AS NEEDED FOR SLEEP. 180 tablet 1   No facility-administered medications prior to visit.   Allergies  Allergen  Reactions   Atorvastatin Other (See Comments)    Joint pain and weight gain   Codeine Nausea And Vomiting   Wellbutrin [Bupropion] Anxiety    Insomnia    Review of Systems  Constitutional:  Negative for chills and fever.  HENT:  Negative for congestion.   Respiratory:  Negative for shortness of breath.   Cardiovascular:  Negative for chest pain and palpitations.  Gastrointestinal:  Negative for abdominal pain, blood in stool, constipation, diarrhea, nausea and vomiting.  Genitourinary:  Negative for dysuria, frequency, hematuria and urgency.  Skin:           Neurological:  Negative for headaches.  Psychiatric/Behavioral:  The patient is nervous/anxious.       Objective:    Physical Exam Constitutional:      General: She is not in acute distress.    Appearance: Normal appearance. She is normal weight. She is not ill-appearing.  HENT:     Head: Normocephalic and atraumatic.     Right Ear: External ear normal.     Left Ear: External ear normal.     Nose: Nose normal.     Mouth/Throat:     Mouth: Mucous membranes are moist.     Pharynx: Oropharynx is clear.  Eyes:     General:        Right eye: No discharge.        Left eye: No discharge.     Extraocular Movements: Extraocular movements intact.     Conjunctiva/sclera: Conjunctivae normal.     Pupils: Pupils are equal, round, and reactive to light.  Cardiovascular:     Rate and Rhythm: Normal rate and regular rhythm.     Pulses: Normal pulses.     Heart sounds: Normal heart sounds. No murmur heard.    No gallop.  Pulmonary:     Effort: Pulmonary effort is normal. No respiratory distress.     Breath sounds: Normal breath  sounds. No wheezing or rales.  Abdominal:     General: Bowel sounds are normal.     Palpations: Abdomen is soft.     Tenderness: There is no abdominal tenderness. There is no guarding.  Musculoskeletal:        General: Normal range of motion.     Cervical back: Normal range of motion.     Right lower leg: No edema.     Left lower leg: No edema.  Skin:    General: Skin is warm and dry.  Neurological:     Mental Status: She is alert and oriented to person, place, and time.  Psychiatric:        Mood and Affect: Mood normal.        Behavior: Behavior normal.        Judgment: Judgment normal.    BP 126/70 (BP Location: Right Arm, Patient Position: Sitting, Cuff Size: Normal)   Pulse 72   Temp 98 F (36.7 C) (Oral)   Resp 16   Ht '5\' 7"'$  (1.702 m)   Wt 194 lb 9.6 oz (88.3 kg)   SpO2 98%   BMI 30.48 kg/m  Wt Readings from Last 3 Encounters:  11/22/22 194 lb 9.6 oz (88.3 kg)  05/22/22 190 lb (86.2 kg)  12/05/21 190 lb (86.2 kg)   Diabetic Foot Exam - Simple   No data filed    Lab Results  Component Value Date   WBC 4.8 05/21/2022   HGB 13.6 05/21/2022   HCT 41.2 05/21/2022   PLT 48.0 Repeated  and verified X2. (LL) 05/21/2022   GLUCOSE 89 05/21/2022   CHOL 205 (H) 03/03/2021   TRIG 105.0 03/03/2021   HDL 63.20 03/03/2021   LDLDIRECT 114.0 01/19/2020   LDLCALC 121 (H) 03/03/2021   ALT 13 05/21/2022   AST 17 05/21/2022   NA 137 05/21/2022   K 4.4 05/21/2022   CL 106 05/21/2022   CREATININE 1.03 05/21/2022   BUN 19 05/21/2022   CO2 28 05/21/2022   TSH 2.49 05/21/2022   INR 0.89 05/14/2018   HGBA1C 5.2 05/21/2022   Lab Results  Component Value Date   TSH 2.49 05/21/2022   Lab Results  Component Value Date   WBC 4.8 05/21/2022   HGB 13.6 05/21/2022   HCT 41.2 05/21/2022   MCV 96.4 05/21/2022   PLT 48.0 Repeated and verified X2. (LL) 05/21/2022   Lab Results  Component Value Date   NA 137 05/21/2022   K 4.4 05/21/2022   CO2 28 05/21/2022   GLUCOSE 89  05/21/2022   BUN 19 05/21/2022   CREATININE 1.03 05/21/2022   BILITOT 0.4 05/21/2022   ALKPHOS 91 05/21/2022   AST 17 05/21/2022   ALT 13 05/21/2022   PROT 7.5 05/21/2022   ALBUMIN 4.1 05/21/2022   CALCIUM 9.5 05/21/2022   ANIONGAP 7 06/19/2016   GFR 57.10 (L) 05/21/2022   Lab Results  Component Value Date   CHOL 205 (H) 03/03/2021   Lab Results  Component Value Date   HDL 63.20 03/03/2021   Lab Results  Component Value Date   LDLCALC 121 (H) 03/03/2021   Lab Results  Component Value Date   TRIG 105.0 03/03/2021   Lab Results  Component Value Date   CHOLHDL 3 03/03/2021   Lab Results  Component Value Date   HGBA1C 5.2 05/21/2022      Assessment & Plan:  Anxiety: Take Alprazolam 0.25 mg and Fluoxetine 60 mg daily prn.  Immunizations: Encouraged Prevnar 44, RSV, and Shingles immunizations. Problem List Items Addressed This Visit     Idiopathic thrombocytopenia ([place])    Long standing and stable      Depression with anxiety    Fluoxetine 20 mg daily and Alprazolam prn      Relevant Medications   FLUoxetine (PROZAC) 20 MG capsule   ALPRAZolam (XANAX) 0.25 MG tablet   Hyperlipidemia, mixed - Primary    Encourage heart healthy diet such as MIND or DASH diet, increase exercise, avoid trans fats, simple carbohydrates and processed foods, consider a krill or fish or flaxseed oil cap daily.       ETOH abuse    reports drinking very little at this time      Meds ordered this encounter  Medications   cetirizine (ZYRTEC) 10 MG tablet    Sig: Take 1 tablet (10 mg total) by mouth daily as needed for allergies or rhinitis.    Dispense:  90 tablet    Refill:  1   FLUoxetine (PROZAC) 20 MG capsule    Sig: TAKE 3 CAPSULES(60 MG) BY MOUTH DAILY    Dispense:  90 capsule    Refill:  1   DISCONTD: topiramate (TOPAMAX) 25 MG tablet    Sig: Take 1 tablet (25 mg total) by mouth daily.    Dispense:  90 tablet    Refill:  1   DISCONTD: traZODone (DESYREL) 50 MG tablet     Sig: TAKE 2 TABLETS (100 MG TOTAL) BY MOUTH AT BEDTIME AS NEEDED FOR SLEEP.    Dispense:  180 tablet    Refill:  1   ALPRAZolam (XANAX) 0.25 MG tablet    Sig: Take 1 tablet (0.25 mg total) by mouth 2 (two) times daily as needed for anxiety.    Dispense:  60 tablet    Refill:  5   I, Penni Homans, MD, personally preformed the services described in this documentation.  All medical record entries made by the scribe were at my direction and in my presence.  I have reviewed the chart and discharge instructions (if applicable) and agree that the record reflects my personal performance and is accurate and complete.  I,Mohammed Iqbal,acting as a scribe for Penni Homans, MD.,have documented all relevant documentation on the behalf of Penni Homans, MD,as directed by  Penni Homans, MD while in the presence of Penni Homans, MD.  Penni Homans, MD

## 2022-11-22 NOTE — Patient Instructions (Signed)
RSV, Respiratory Syncitial vaccine, Arexvy at pharmacy Prevnar 20 once  Shingrix is the new shingles shot, 2 shots over 2-6 months, confirm coverage with insurance and document, then can return here for shots with nurse appt or at pharmacy     Thrombocytopenia Thrombocytopenia is a condition in which there are a low number of platelets in the blood. Platelets are also called thrombocytes. Platelets are parts of blood that stick together and form a clot to help the body stop bleeding after an injury. If you have too few platelets, your blood may have trouble clotting. This may cause you to bleed and bruise very easily. Some cases of thrombocytopenia are mild while others are more severe. What are the causes? This condition is caused by a low number of platelets in your blood. There are three main reasons for this: Your body not making enough platelets. This may be caused by: Bone marrow diseases. This include aplastic anemia, leukemia, and myelodysplastic anemia. Congenital thrombocytopenia. This is a condition that is passed from parent to child (inherited). Certain cancer treatments, including chemotherapy and radiation therapy. Infections from bacteria or viruses. Alcohol use disorder and alcoholism. Platelets not being released in the blood. This is called platelet sequestration and it can happen due to: An overactive spleen (hypersplenism). The spleen gathers up platelets from circulation, meaning that the platelets are not available to help with clotting your blood. The spleen can be enlarged because of scarring or other conditions. Gaucher disease. Your body destroying platelets too quickly. This may be caused by: An autoimmune disease that causes immune thrombocytopenia (ITP). ITP is sometimes associated with other autoimmune conditions such as lupus. Certain medicines, such as blood thinners. Certain blood clotting or bleeding disorders. Exposure to toxic chemicals, such as  pesticides, lead, benzene, and arsenic. Pregnancy. What are the signs or symptoms? Symptoms of this condition are the result of poor blood clotting. They will vary depending on how low the platelet counts are. Symptoms may include: Bruising easily. Bleeding from the mouth or nose. Heavy menstrual periods. Blood in the urine, stool (feces), or vomit. Purplish-red discolorations on the skin (purpura). A rash that looks like pinpoint, purplish-red spots (petechiae) on the lower legs. How is this diagnosed?  This condition may be diagnosed with blood tests and a physical exam. You may also have other tests, including: A sample of bone marrow (biopsy) may be removed to look for the original cells that make platelets. An ultrasound or CT scan of the abdomen to check for an enlarged spleen, enlarged lymph nodes, or liver problems. How is this treated? Treatment for this condition depends on the cause. Treatment may include: Treatment of another condition that is causing the low platelet count. Medicines to help protect your platelets from being destroyed. A replacement (transfusion) of platelets to stop or prevent bleeding. Surgery to remove the spleen. Follow these instructions at home: Medicines Take over-the-counter and prescription medicines only as told by your health care provider. Do not take any medicines that contain aspirin or NSAIDs, such as ibuprofen. These medicines increase your risk for dangerous bleeding. Activity Avoid activities that could cause injury or bruising, and follow instructions about how to prevent falls. Do not play contact sports. Ask your health care provider what activities are safe for you. Take extra care to protect yourself from burns when ironing or cooking. Take extra care not to cut yourself when you shave or when you use scissors, needles, knives, and other tools. General instructions  Check your skin  and the inside of your mouth for bruising or  bleeding as told by your health care provider. Wear a medical alert bracelet that says that you have a bleeding disorder. This can help you get the treatment you need in case of emergency. Check your urine and stool for blood as told by your health care provider. Do not drink alcohol. If you do drink alcohol, limit the amount that you drink. Minimize contact with toxic chemicals. Tell all your health care providers, including dental care providers and eye doctors, about your condition. Make sure to tell dental care providers before you have any procedure done, including dental cleanings. Keep all follow-up visits. This is important. Contact a health care provider if: You have unexplained bruising. You have new symptoms. You have symptoms that get worse. You have a fever. Get help right away if: You have severe bleeding from anywhere on your body. You have blood in your vomit, urine, or stool. You have an injury to your head. You have a sudden, severe headache. Summary Thrombocytopenia is a condition in which you have a low number of platelets in the blood. Platelets are parts of blood that stick together to form a clot. Symptoms of this condition are the result of poor blood clotting and may include bruising easily, bleeding from the nose or mouth, petechiae, and purpura. This condition may be diagnosed with blood tests and a physical exam. Treatment for this condition depends on the cause. This information is not intended to replace advice given to you by your health care provider. Make sure you discuss any questions you have with your health care provider. Document Revised: 05/04/2021 Document Reviewed: 05/04/2021 Elsevier Patient Education  [place].

## 2022-11-24 ENCOUNTER — Other Ambulatory Visit: Payer: Self-pay | Admitting: Family Medicine

## 2022-11-27 NOTE — Assessment & Plan Note (Signed)
reports drinking very little at this time

## 2023-01-02 ENCOUNTER — Ambulatory Visit (INDEPENDENT_AMBULATORY_CARE_PROVIDER_SITE_OTHER): Payer: Medicare Other

## 2023-01-02 VITALS — Wt 194.0 lb

## 2023-01-02 DIAGNOSIS — Z0000 Encounter for general adult medical examination without abnormal findings: Secondary | ICD-10-CM | POA: Diagnosis not present

## 2023-01-02 NOTE — Patient Instructions (Signed)
Laura Bender , Thank you for taking time to come for your Medicare Wellness Visit. I appreciate your ongoing commitment to your health goals. Please review the following plan we discussed and let me know if I can assist you in the future.   These are the goals we discussed:  Goals      Patient Stated     Be more active         This is a list of the screening recommended for you and due dates:  Health Maintenance  Topic Date Due   Zoster (Shingles) Vaccine (1 of 2) Never done   COVID-19 Vaccine (3 - 2023-24 season) 08/03/2022   DTaP/Tdap/Td vaccine (2 - Td or Tdap) 04/16/2023   Medicare Annual Wellness Visit  01/03/2024   Mammogram  05/21/2024   Pneumonia Vaccine (3 - PPSV23 or PCV20) 07/15/2024   Colon Cancer Screening  02/16/2026   Flu Shot  Completed   DEXA scan (bone density measurement)  Completed   Hepatitis C Screening: USPSTF Recommendation to screen - Ages 18-79 yo.  Completed   HPV Vaccine  Aged Out    Advanced directives: Please bring a copy of your health care power of attorney and living will to the office at your convenience.  Conditions/risks identified: be more active   Next appointment: Follow up in one year for your annual wellness visit    Preventive Care 65 Years and Older, Female Preventive care refers to lifestyle choices and visits with your health care provider that can promote health and wellness. What does preventive care include? A yearly physical exam. This is also called an annual well check. Dental exams once or twice a year. Routine eye exams. Ask your health care provider how often you should have your eyes checked. Personal lifestyle choices, including: Daily care of your teeth and gums. Regular physical activity. Eating a healthy diet. Avoiding tobacco and drug use. Limiting alcohol use. Practicing safe sex. Taking low-dose aspirin every day. Taking vitamin and mineral supplements as recommended by your health care provider. What  happens during an annual well check? The services and screenings done by your health care provider during your annual well check will depend on your age, overall health, lifestyle risk factors, and family history of disease. Counseling  Your health care provider may ask you questions about your: Alcohol use. Tobacco use. Drug use. Emotional well-being. Home and relationship well-being. Sexual activity. Eating habits. History of falls. Memory and ability to understand (cognition). Work and work Statistician. Reproductive health. Screening  You may have the following tests or measurements: Height, weight, and BMI. Blood pressure. Lipid and cholesterol levels. These may be checked every 5 years, or more frequently if you are over 80 years old. Skin check. Lung cancer screening. You may have this screening every year starting at age 33 if you have a 30-pack-year history of smoking and currently smoke or have quit within the past 15 years. Fecal occult blood test (FOBT) of the stool. You may have this test every year starting at age 40. Flexible sigmoidoscopy or colonoscopy. You may have a sigmoidoscopy every 5 years or a colonoscopy every 10 years starting at age 6. Hepatitis C blood test. Hepatitis B blood test. Sexually transmitted disease (STD) testing. Diabetes screening. This is done by checking your blood sugar (glucose) after you have not eaten for a while (fasting). You may have this done every 1-3 years. Bone density scan. This is done to screen for osteoporosis. You may have this  done starting at age 95. Mammogram. This may be done every 1-2 years. Talk to your health care provider about how often you should have regular mammograms. Talk with your health care provider about your test results, treatment options, and if necessary, the need for more tests. Vaccines  Your health care provider may recommend certain vaccines, such as: Influenza vaccine. This is recommended every  year. Tetanus, diphtheria, and acellular pertussis (Tdap, Td) vaccine. You may need a Td booster every 10 years. Zoster vaccine. You may need this after age 20. Pneumococcal 13-valent conjugate (PCV13) vaccine. One dose is recommended after age 13. Pneumococcal polysaccharide (PPSV23) vaccine. One dose is recommended after age 81. Talk to your health care provider about which screenings and vaccines you need and how often you need them. This information is not intended to replace advice given to you by your health care provider. Make sure you discuss any questions you have with your health care provider. Document Released: 12/16/2015 Document Revised: 08/08/2016 Document Reviewed: 09/20/2015 Elsevier Interactive Patient Education  2017 [place] Prevention in the Home Falls can cause injuries. They can happen to people of all ages. There are many things you can do to make your home safe and to help prevent falls. What can I do on the outside of my home? Regularly fix the edges of walkways and driveways and fix any cracks. Remove anything that might make you trip as you walk through a door, such as a raised step or threshold. Trim any bushes or trees on the path to your home. Use bright outdoor lighting. Clear any walking paths of anything that might make someone trip, such as rocks or tools. Regularly check to see if handrails are loose or broken. Make sure that both sides of any steps have handrails. Any raised decks and porches should have guardrails on the edges. Have any leaves, snow, or ice cleared regularly. Use sand or salt on walking paths during winter. Clean up any spills in your garage right away. This includes oil or grease spills. What can I do in the bathroom? Use night lights. Install grab bars by the toilet and in the tub and shower. Do not use towel bars as grab bars. Use non-skid mats or decals in the tub or shower. If you need to sit down in the shower, use a  plastic, non-slip stool. Keep the floor dry. Clean up any water that spills on the floor as soon as it happens. Remove soap buildup in the tub or shower regularly. Attach bath mats securely with double-sided non-slip rug tape. Do not have throw rugs and other things on the floor that can make you trip. What can I do in the bedroom? Use night lights. Make sure that you have a light by your bed that is easy to reach. Do not use any sheets or blankets that are too big for your bed. They should not hang down onto the floor. Have a firm chair that has side arms. You can use this for support while you get dressed. Do not have throw rugs and other things on the floor that can make you trip. What can I do in the kitchen? Clean up any spills right away. Avoid walking on wet floors. Keep items that you use a lot in easy-to-reach places. If you need to reach something above you, use a strong step stool that has a grab bar. Keep electrical cords out of the way. Do not use floor polish or wax  that makes floors slippery. If you must use wax, use non-skid floor wax. Do not have throw rugs and other things on the floor that can make you trip. What can I do with my stairs? Do not leave any items on the stairs. Make sure that there are handrails on both sides of the stairs and use them. Fix handrails that are broken or loose. Make sure that handrails are as long as the stairways. Check any carpeting to make sure that it is firmly attached to the stairs. Fix any carpet that is loose or worn. Avoid having throw rugs at the top or bottom of the stairs. If you do have throw rugs, attach them to the floor with carpet tape. Make sure that you have a light switch at the top of the stairs and the bottom of the stairs. If you do not have them, ask someone to add them for you. What else can I do to help prevent falls? Wear shoes that: Do not have high heels. Have rubber bottoms. Are comfortable and fit you  well. Are closed at the toe. Do not wear sandals. If you use a stepladder: Make sure that it is fully opened. Do not climb a closed stepladder. Make sure that both sides of the stepladder are locked into place. Ask someone to hold it for you, if possible. Clearly mark and make sure that you can see: Any grab bars or handrails. First and last steps. Where the edge of each step is. Use tools that help you move around (mobility aids) if they are needed. These include: Canes. Walkers. Scooters. Crutches. Turn on the lights when you go into a dark area. Replace any light bulbs as soon as they burn out. Set up your furniture so you have a clear path. Avoid moving your furniture around. If any of your floors are uneven, fix them. If there are any pets around you, be aware of where they are. Review your medicines with your doctor. Some medicines can make you feel dizzy. This can increase your chance of falling. Ask your doctor what other things that you can do to help prevent falls. This information is not intended to replace advice given to you by your health care provider. Make sure you discuss any questions you have with your health care provider. Document Released: 09/15/2009 Document Revised: 04/26/2016 Document Reviewed: 12/24/2014 Elsevier Interactive Patient Education  2017 [employer].

## 2023-01-02 NOTE — Progress Notes (Signed)
I have reviewed and agree with [facility] documentation.  Kathlene November, MD

## 2023-01-02 NOTE — Progress Notes (Signed)
I connected with  Laura Bender on 01/02/23 by a audio enabled telemedicine application and verified that I am speaking with the correct person using two identifiers.  Patient Location: Home  Provider Location: Home Office  I discussed the limitations of evaluation and management by telemedicine. The patient expressed understanding and agreed to proceed.   Subjective:   Laura Bender is a 67 y.o. female who presents for an Initial Medicare Annual Wellness Visit.  Review of Systems     Cardiac Risk Factors include: advanced age (>68mn, >>3women);hypertension;dyslipidemia;obesity (BMI >30kg/m2);sedentary lifestyle     Objective:    Today's Vitals   01/02/23 1032  Weight: 194 lb (88 kg)   Body mass index is 30.38 kg/m.     01/02/2023   10:36 AM 12/05/2018    3:03 PM 05/14/2018   11:23 AM 02/28/2018    2:58 PM 06/19/2016   12:05 PM 02/13/2016    1:50 PM 10/07/2015    9:23 AM  Advanced Directives  Does Patient Have a Medical Advance Directive? Yes No Yes Yes No No Yes  Type of AParamedicof [place] will  [place] will   [place] will  Does patient want to make changes to medical advance directive?   No - Patient declined No - Patient declined   No - Patient declined  Copy of [place]in Chart? No - copy requested  No - copy requested    No - copy requested  Would patient like information on creating a medical advance directive?     No - patient declined information No - patient declined information     Current Medications (verified) Outpatient Encounter Medications as of 01/02/2023  Medication Sig   acetaminophen (TYLENOL) 500 MG tablet Take 1,000 mg by mouth every 6 (six) hours as needed for pain.   ALPRAZolam (XANAX) 0.25 MG tablet Take 1 tablet (0.25 mg total) by mouth 2 (two) times daily as needed for anxiety.   Calcium  Carb-Cholecalciferol (CALCIUM 600 + D PO) Take 1 tablet by mouth daily.   cetirizine (ZYRTEC) 10 MG tablet Take 1 tablet (10 mg total) by mouth daily as needed for allergies or rhinitis.   Cholecalciferol (VITAMIN D) 2000 units CAPS Take 2,000 Units by mouth daily.   docusate sodium (COLACE) 100 MG capsule Take 100 mg by mouth daily.   FLUoxetine (PROZAC) 20 MG capsule TAKE 3 CAPSULES(60 MG) BY MOUTH DAILY   fluticasone (FLONASE) 50 MCG/ACT nasal spray Place 1 spray into both nostrils daily.   meclizine (ANTIVERT) 25 MG tablet Take 1 tablet (25 mg total) by mouth 3 (three) times daily as needed for dizziness.   omeprazole (PRILOSEC) 20 MG capsule Take 1 capsule (20 mg total) by mouth daily.   topiramate (TOPAMAX) 25 MG tablet TAKE 1 TABLET(25 MG) BY MOUTH DAILY   traZODone (DESYREL) 50 MG tablet TAKE 2 TABLETS(100 MG) BY MOUTH AT BEDTIME AS NEEDED FOR SLEEP   COVID-19 mRNA vaccine 2023-2024 (COMIRNATY) syringe Inject into the muscle.   influenza vaccine adjuvanted (FLUAD QUADRIVALENT) 0.5 ML injection Inject into the muscle.   [DISCONTINUED] methylPREDNISolone (MEDROL) 4 MG tablet 6 tabs po x 1 day then5 tabs po x 1 day then 4 tabs po x 1 day then 3 tabs po x 1 day then 2 tabs po x 1 day then 1 tab po x 1 day and stop   No facility-administered encounter medications  on file as of 01/02/2023.    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  '95- blow normal levels, but stable.   Lipoma 03-04-12   Rt. back about scapular ares   Migraine    "that's what the dr thinks I had yesterday; still unsure; never had one before" (06/19/2016)   Overweight 03/06/2015   PONV (postoperative nausea and vomiting)    Preventative health care 04/17/2013; 08/17/2013   Solitary pulmonary nodule 08/17/2013   LLL seen on CT in March 2014   Past Surgical History:  Procedure Laterality Date   [place] Left 2012   needle biopsy(benign)-Titanium needle marker remains   CHOLECYSTECTOMY N/A 04/24/2013   Procedure: LAPAROSCOPIC CHOLECYSTECTOMY WITH INTRAOPERATIVE CHOLANGIOGRAM;  Surgeon: Haywood Lasso, MD;  Location: [facility];  Service: General;  Laterality: N/A;   COLONOSCOPY  2008   WNL, Dr Buccini   IRRIGATION AND DEBRIDEMENT ABSCESS  ~ 2005 X 2   buttocks   LIPOMA EXCISION  ~ 04/2016   "base of my neck"   MASS EXCISION  03/10/2012   Procedure: EXCISION MASS;  Surgeon: Odis Hollingshead, MD;  Location: [facility];  Service: General;  Laterality: N/A;  Removal of back lipoma   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   SPLENECTOMY, TOTAL  1995   TOTAL ABDOMINAL HYSTERECTOMY  1995   total, endometriosis, fibroid, ovarian atrophy   Family History  Problem Relation Age of Onset   Heart disease Maternal Grandmother    Heart disease Maternal Grandfather    Heart attack Maternal Grandfather 62   Heart disease Paternal Grandmother    Heart attack Paternal Grandmother 86   Heart disease Paternal Grandfather    Cancer Paternal Grandfather        chest- smoker   Diabetes Mother        type 2- controlled by diet   Atrial fibrillation Father    Hyperlipidemia Sister    Migraines Sister    Cancer Sister        pancreatic   Depression Sister    COPD Sister    Social History   Socioeconomic History   Marital status: Single    Spouse name: Not on file   Number of children: 0    Years of education: 12   Highest education level: Not on file  Occupational History   Not on file  Tobacco Use   Smoking status: Former    Packs/day: 0.75    Years: 5.00    Total pack years: 3.75    Types: Cigarettes    Quit date: 02/21/1983    Years since quitting: 39.8   Smokeless tobacco: Never  Substance and Sexual Activity   Alcohol use: Yes    Alcohol/week: 12.0 standard drinks of alcohol    Types: 12 Cans of beer per week  Drug use: No   Sexual activity: Never    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   Social Determinants of Health   Financial Resource Strain: Low Risk  (01/02/2023)   Overall Financial Resource Strain (CARDIA)    Difficulty of Paying Living Expenses: Not hard at all  Food Insecurity: No Food Insecurity (01/02/2023)   Hunger Vital Sign    Worried About Running Out of Food in the Last Year: Never true    Ran Out of Food in the Last Year: Never true  Transportation Needs: No Transportation Needs (01/02/2023)   PRAPARE - Hydrologist (Medical): No    Lack of Transportation (Non-Medical): No  Physical Activity: Insufficiently Active (01/02/2023)   Exercise Vital Sign    Days of Exercise per Week: 4 days    Minutes of Exercise per Session: 30 min  Stress: No Stress Concern Present (01/02/2023)   [place]    Feeling of Stress : Not at all  Social Connections: Socially Isolated (01/02/2023)   Social Connection and Isolation Panel [NHANES]    Frequency of Communication with Friends and Family: Once a week    Frequency of Social Gatherings with Friends and Family: Once a week    Attends Religious Services: Never    Building surveyor or Organizations: No    Attends Music therapist: Never    Marital Status: Never married    Tobacco Counseling Counseling given: Not Answered   Clinical Intake:  Pre-visit preparation completed: Yes  Pain : No/denies pain     BMI - recorded: 30.38 Nutritional Status: BMI > 30  Obese Nutritional Risks: None Diabetes: No  How often do you need to have someone help you when you read instructions, pamphlets, or other written materials from your doctor or pharmacy?: 1 - Never  Diabetic?no  Interpreter Needed?: No  Information entered by :: Charlott Rakes, LPN   Activities of Daily Living    01/02/2023   10:37 AM  In your present state of health, do you have any difficulty performing the following activities:  Hearing? 0  Vision? 0  Difficulty concentrating or making decisions? 0  Walking or climbing stairs? 0  Dressing or bathing? 0  Doing errands, shopping? 0  Preparing Food and eating ? N  Using the Toilet? N  In the past six months, have you accidently leaked urine? Y  Comment at times  Do you have problems with loss of bowel control? N  Managing your Medications? N  Managing your Finances? N  Housekeeping or managing your Housekeeping? N    Patient Care Team: [name], MD as PCP - General (Family Medicine) Ronald Lobo, MD as Consulting Physician (Gastroenterology) Clent Jacks, MD as Consulting Physician (Ophthalmology)  Indicate any recent Medical Services you may have received from other than [facility] providers in the past year (date may be approximate).     Assessment:   This is a routine wellness examination for Nidya.  Hearing/Vision screen Hearing Screening - Comments:: Pt denies any hearing issues  Vision Screening - Comments:: Pt follows up with dr Katy Fitch for annual eye exams   Dietary  issues and exercise activities discussed: Current Exercise Habits: Home exercise routine, Type of exercise: walking, Time  (Minutes): 30, Frequency (Times/Week): 4, Weekly Exercise (Minutes/Week): 120   Goals Addressed             This Visit's Progress    Patient Stated       Be more active        Depression Screen    01/02/2023   10:34 AM 11/22/2022   10:42 AM 05/22/2022    1:44 PM 12/05/2021   11:08 AM 08/24/2021   10:14 AM 07/18/2021    3:06 PM 08/22/2020    2:06 PM  PHQ 2/9 Scores  PHQ - 2 Score 0 2 0 0 2 2 0  PHQ- 9 Score 0 '3 1 1 7 13 3    '$ Fall Risk    01/02/2023   10:37 AM 11/22/2022   10:42 AM 05/22/2022    1:43 PM 07/18/2021    3:06 PM 09/01/2014   11:22 AM  Fall Risk   Falls in the past year? 0 0 0 0 No  Number falls in past yr: 0 0 0 0   Injury with Fall? 0 0 0 0   Risk for fall due to : Impaired vision  No Fall Risks No Fall Risks   Follow up Falls prevention discussed Falls evaluation completed Falls evaluation completed Falls evaluation completed     [place]:  Any stairs in or around the home? Yes  If so, are there any without handrails? No  Home free of loose throw rugs in walkways, pet beds, electrical cords, etc? Yes  Adequate lighting in your home to reduce risk of falls? Yes   ASSISTIVE DEVICES UTILIZED TO PREVENT FALLS:  Life alert? No  Use of a cane, walker or w/c? No  Grab bars in the bathroom? Yes  Shower chair or bench in shower? No  Elevated toilet seat or a handicapped toilet? yes  TIMED UP AND GO:  Was the test performed? No .   Cognitive Function:        01/02/2023   10:37 AM  6CIT Screen  What Year? 0 points  What month? 0 points  What time? 0 points  Count back from 20 0 points  Months in reverse 0 points  Repeat phrase 0 points  Total Score 0 points    Immunizations Immunization History  Administered Date(s) Administered   Fluad Quad(high Dose 65+) 09/18/2022   Influenza Inj Mdck Quad Pf 08/27/2019   Influenza Split 09/02/2012   Influenza,inj,Quad PF,6+ Mos 08/17/2013, 08/31/2014, 07/31/2016,  09/03/2017, 09/05/2018, 08/22/2020, 08/24/2021   Influenza-Unspecified 09/01/2015, 09/05/2018, 09/20/2022   PFIZER(Purple Top)SARS-COV-2 Vaccination 08/23/2020, 09/14/2020   Pneumococcal Conjugate-13 12/03/1993, 12/04/2003, 10/19/2013   Pneumococcal Polysaccharide-23 07/16/2019   Tdap 04/15/2013    TDAP status: Up to date  Flu Vaccine status: Up to date  Pneumococcal vaccine status: Up to date  Covid-19 vaccine status: Completed vaccines  Qualifies for Shingles Vaccine? Yes   Zostavax completed No   Shingrix Completed?: No.    Education has been provided regarding the importance of this vaccine. Patient has been advised to call insurance company to determine out of pocket expense if they have not yet received this vaccine. Advised may also receive vaccine at local pharmacy or Health Dept. Verbalized acceptance and understanding.  Screening Tests Health Maintenance  Topic Date Due   Zoster Vaccines- Shingrix (1 of 2) Never done   COVID-19 Vaccine (3 - 2023-24  season) 08/03/2022   DTaP/Tdap/Td (2 - Td or Tdap) 04/16/2023   Medicare Annual Wellness (AWV)  01/03/2024   MAMMOGRAM  05/21/2024   Pneumonia Vaccine 36+ Years old (3 - PPSV23 or PCV20) 07/15/2024   COLONOSCOPY (Pts 45-31yr Insurance coverage will need to be confirmed)  02/16/2026   INFLUENZA VACCINE  Completed   DEXA SCAN  Completed   Hepatitis C Screening  Completed   HPV VACCINES  Aged Out    Health Maintenance  Health Maintenance Due  Topic Date Due   Zoster Vaccines- Shingrix (1 of 2) Never done   COVID-19 Vaccine (3 - 2023-24 season) 08/03/2022    Colorectal cancer screening: Type of screening: Colonoscopy. Completed 02/17/16. Repeat every 10 years  Mammogram status: Completed 05/21/22. Repeat every year  Bone Density status: Completed 05/2018. Results reflect: Bone density results: OSTEOPENIA. Repeat every 05/22/18 years.   Additional Screening:  Hepatitis C Screening Completed 03/12/17  Vision  Screening: Recommended annual ophthalmology exams for early detection of glaucoma and other disorders of the eye. Is the patient up to date with their annual eye exam?  Yes  Who is the provider or what is the name of the office in which the patient attends annual eye exams? Dr GKaty Fitch If pt is not established with a provider, would they like to be referred to a provider to establish care? No .   Dental Screening: Recommended annual dental exams for proper oral hygiene  Community Resource Referral / Chronic Care Management: CRR required this visit?  No   CCM required this visit?  No      Plan:     I have personally reviewed and noted the following in the patient's chart:   Medical and social history Use of alcohol, tobacco or illicit drugs  Current medications and supplements including opioid prescriptions. Patient is not currently taking opioid prescriptions. Functional ability and status Nutritional status Physical activity Advanced directives List of other physicians Hospitalizations, surgeries, and ER visits in previous 12 months Vitals Screenings to include cognitive, depression, and falls Referrals and appointments  In addition, I have reviewed and discussed with patient certain preventive protocols, quality metrics, and best practice recommendations. A written personalized care plan for preventive services as well as general preventive health recommendations were provided to patient.     TWillette Brace LPN   [id number]  Nurse Notes: none

## 2023-01-18 ENCOUNTER — Other Ambulatory Visit: Payer: Self-pay | Admitting: Family Medicine

## 2023-01-24 ENCOUNTER — Telehealth: Payer: Self-pay | Admitting: Family Medicine

## 2023-01-24 NOTE — Telephone Encounter (Signed)
Copied from [place] [phone]. Topic: Medicare AWV >> Jan 24, 2023  2:05 PM Devoria Glassing wrote: Reason for CRM: Called patient to schedule Medicare Annual Wellness Visit (AWV). Left message for patient to call back and schedule Medicare Annual Wellness Visit (AWV).  Last date of AWV: NONE  Please schedule an appointment at any time with [facility].  If any questions, please contact me.  Thank you ,  Sherol Dade; [place] Direct Dial: [phone]

## 2023-02-22 NOTE — Progress Notes (Signed)
MyChart Video Visit    Virtual Visit via Video Note   Patient location: Patient and provider in visit Provider location: Office  I discussed the limitations of evaluation and management by telemedicine and the availability of in person appointments. The patient expressed understanding and agreed to proceed.  Visit Date: 02/25/2023.  Today's healthcare provider: Penni Homans, MD     Subjective:    [name], female    DOB: 1956/10/24, 67 y.o.   MRN: [id number]  No chief complaint on file.   HPI Patient is in today for a telehealth video visit.    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  '95- blow normal levels, but stable.   Lipoma 03-04-12   Rt. back about scapular ares   Migraine    "that's what the dr thinks I had yesterday; still unsure; never had one before" (06/19/2016)   Overweight 03/06/2015   PONV (postoperative nausea and vomiting)    Preventative health care 04/17/2013; 08/17/2013   Solitary pulmonary nodule 08/17/2013   LLL seen on CT in March 2014    Past Surgical History:  Procedure Laterality Date   [place] Left 2012   needle biopsy(benign)-Titanium needle marker remains   CHOLECYSTECTOMY N/A 04/24/2013   Procedure: LAPAROSCOPIC CHOLECYSTECTOMY WITH INTRAOPERATIVE CHOLANGIOGRAM;  Surgeon: Haywood Lasso, MD;  Location: [facility];  Service: General;  Laterality: N/A;   COLONOSCOPY  2008   WNL, Dr  Buccini   IRRIGATION AND DEBRIDEMENT ABSCESS  ~ 2005 X 2   buttocks   LIPOMA EXCISION  ~ 04/2016   "base of my neck"   MASS EXCISION  03/10/2012   Procedure: EXCISION MASS;  Surgeon: Odis Hollingshead, MD;  Location: [facility];  Service: General;  Laterality: N/A;  Removal of back lipoma   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   SPLENECTOMY, TOTAL  1995   TOTAL ABDOMINAL HYSTERECTOMY  1995   total, endometriosis, fibroid, ovarian atrophy    Family History  Problem Relation Age of Onset   Heart disease Maternal Grandmother    Heart disease Maternal Grandfather    Heart attack Maternal Grandfather 62   Heart disease Paternal Grandmother    Heart attack Paternal Grandmother 86   Heart disease Paternal Grandfather    Cancer Paternal Grandfather        chest- smoker   Diabetes Mother        type 2- controlled by diet   Atrial fibrillation Father    Hyperlipidemia Sister    Migraines Sister    Cancer Sister        pancreatic   Depression Sister    COPD Sister     Social History   Socioeconomic History   Marital status: Single    Spouse name: Not on file   Number of children: 0   Years of education: 72  Highest education level: Not on file  Occupational History   Not on file  Tobacco Use   Smoking status: Former    Packs/day: 0.75    Years: 5.00    Additional pack years: 0.00    Total pack years: 3.75    Types: Cigarettes    Quit date: 02/21/1983    Years since quitting: 40.0   Smokeless tobacco: Never  Substance and Sexual Activity   Alcohol use: Yes    Alcohol/week: 12.0 standard drinks of alcohol    Types: 12 Cans of beer per week   Drug use: No   Sexual activity: Never    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    Social Determinants of Health   Financial Resource Strain: Low Risk  (01/02/2023)   Overall Financial Resource Strain (CARDIA)    Difficulty of Paying Living Expenses: Not hard at all  Food Insecurity: No Food Insecurity (01/02/2023)   Hunger Vital Sign    Worried About Running Out of Food in the Last Year: Never true    Ran Out of Food in the Last Year: Never true  Transportation Needs: No Transportation Needs (01/02/2023)   PRAPARE - Hydrologist (Medical): No    Lack of Transportation (Non-Medical): No  Physical Activity: Insufficiently Active (01/02/2023)   Exercise Vital Sign    Days of Exercise per Week: 4 days    Minutes of Exercise per Session: 30 min  Stress: No Stress Concern Present (01/02/2023)   [place]    Feeling of Stress : Not at all  Social Connections: Socially Isolated (01/02/2023)   Social Connection and Isolation Panel [NHANES]    Frequency of Communication with Friends and Family: Once a week    Frequency of Social Gatherings with Friends and Family: Once a week    Attends Religious Services: Never    Marine scientist or Organizations: No    Attends Archivist Meetings: Never    Marital Status: Never married  Intimate Partner Violence: Not At Risk (01/02/2023)   Humiliation, Afraid, Rape, and Kick questionnaire    Fear of Current or Ex-Partner: No    Emotionally Abused: No    Physically Abused: No    Sexually Abused: No    Outpatient Medications Prior to Visit  Medication Sig Dispense Refill   acetaminophen (TYLENOL) 500 MG tablet Take 1,000 mg by mouth every 6 (six) hours as needed for pain.     ALPRAZolam (XANAX) 0.25 MG tablet Take 1 tablet (0.25 mg total) by mouth 2 (two) times daily as needed for anxiety. 60 tablet 5   Calcium Carb-Cholecalciferol (CALCIUM 600 + D PO) Take 1 tablet by mouth daily.     cetirizine (ZYRTEC) 10 MG tablet Take 1  tablet (10 mg total) by mouth daily as needed for allergies or rhinitis. 90 tablet 1   Cholecalciferol (VITAMIN D) 2000 units CAPS Take 2,000 Units by mouth daily.     COVID-19 mRNA vaccine 2023-2024 (COMIRNATY) syringe Inject into the muscle. 0.3 mL 0   docusate sodium (COLACE) 100 MG capsule Take 100 mg by mouth daily.     FLUoxetine (PROZAC)  20 MG capsule TAKE 3 CAPSULES(60 MG) BY MOUTH DAILY 90 capsule 1   fluticasone (FLONASE) 50 MCG/ACT nasal spray Place 1 spray into both nostrils daily. 48 g 1   influenza vaccine adjuvanted (FLUAD QUADRIVALENT) 0.5 ML injection Inject into the muscle. 0.5 mL 0   meclizine (ANTIVERT) 25 MG tablet Take 1 tablet (25 mg total) by mouth 3 (three) times daily as needed for dizziness. 30 tablet 0   omeprazole (PRILOSEC) 20 MG capsule Take 1 capsule (20 mg total) by mouth daily. 90 capsule 1   topiramate (TOPAMAX) 25 MG tablet TAKE 1 TABLET(25 MG) BY MOUTH DAILY 90 tablet 1   traZODone (DESYREL) 50 MG tablet TAKE 2 TABLETS(100 MG) BY MOUTH AT BEDTIME AS NEEDED FOR SLEEP 180 tablet 1   No facility-administered medications prior to visit.    Allergies  Allergen Reactions   Atorvastatin Other (See Comments)    Joint pain and weight gain   Codeine Nausea And Vomiting   Wellbutrin [Bupropion] Anxiety    Insomnia     ROS     Objective:     Physical Exam: Gen: Awake, alert, no acute distress. Resp: Breathing is even and non-labored. Psych: calm/pleasant demeanor. Neuro: Alert and Oriented x3, + facial symmetry, speech is clear.  There were no vitals taken for this visit. Wt Readings from Last 3 Encounters:  01/02/23 194 lb (88 kg)  11/22/22 194 lb 9.6 oz (88.3 kg)  05/22/22 190 lb (86.2 kg)    Diabetic Foot Exam - Simple   No data filed    Lab Results  Component Value Date   WBC 4.8 05/21/2022   HGB 13.6 05/21/2022   HCT 41.2 05/21/2022   PLT 48.0 Repeated and verified X2. (LL) 05/21/2022   GLUCOSE 89 05/21/2022   CHOL 205 (H)  03/03/2021   TRIG 105.0 03/03/2021   HDL 63.20 03/03/2021   LDLDIRECT 114.0 01/19/2020   LDLCALC 121 (H) 03/03/2021   ALT 13 05/21/2022   AST 17 05/21/2022   NA 137 05/21/2022   K 4.4 05/21/2022   CL 106 05/21/2022   CREATININE 1.03 05/21/2022   BUN 19 05/21/2022   CO2 28 05/21/2022   TSH 2.49 05/21/2022   INR 0.89 05/14/2018   HGBA1C 5.2 05/21/2022    Lab Results  Component Value Date   TSH 2.49 05/21/2022   Lab Results  Component Value Date   WBC 4.8 05/21/2022   HGB 13.6 05/21/2022   HCT 41.2 05/21/2022   MCV 96.4 05/21/2022   PLT 48.0 Repeated and verified X2. (LL) 05/21/2022   Lab Results  Component Value Date   NA 137 05/21/2022   K 4.4 05/21/2022   CO2 28 05/21/2022   GLUCOSE 89 05/21/2022   BUN 19 05/21/2022   CREATININE 1.03 05/21/2022   BILITOT 0.4 05/21/2022   ALKPHOS 91 05/21/2022   AST 17 05/21/2022   ALT 13 05/21/2022   PROT 7.5 05/21/2022   ALBUMIN 4.1 05/21/2022   CALCIUM 9.5 05/21/2022   ANIONGAP 7 06/19/2016   GFR 57.10 (L) 05/21/2022   Lab Results  Component Value Date   CHOL 205 (H) 03/03/2021   Lab Results  Component Value Date   HDL 63.20 03/03/2021   Lab Results  Component Value Date   LDLCALC 121 (H) 03/03/2021   Lab Results  Component Value Date   TRIG 105.0 03/03/2021   Lab Results  Component Value Date   CHOLHDL 3 03/03/2021   Lab Results  Component Value Date  HGBA1C 5.2 05/21/2022       Assessment & Plan:   Problem List Items Addressed This Visit   None    No orders of the defined types were placed in this encounter.   I discussed the assessment and treatment plan with the patient. The patient was provided an opportunity to ask questions and all were answered. The patient agreed with the plan and demonstrated an understanding of the instructions.   The patient was advised to call back or seek an in-person evaluation if the symptoms worsen or if the condition fails to improve as anticipated.  I  provided *** minutes of face-to-face time during this encounter.   I,Mathew Stumpf,acting as a Education administrator for Penni Homans, MD.,have documented all relevant documentation on the behalf of Penni Homans, MD,as directed by  Penni Homans, MD while in the presence of Penni Homans, MD.  I, Madelin Rear, personally preformed the services described in this documentation.  All medical record entries made by the scribe were at my direction and in my presence.  I have reviewed the chart and discharge instructions (if applicable) and agree that the record reflects my personal performance and is accurate and complete. 02/25/2023.  Penni Homans, MD [facility] [place] (phone) [phone] (fax)  [place]

## 2023-02-24 NOTE — Assessment & Plan Note (Signed)
On Fluoxetine 60 mg daily and Alprazolam prn

## 2023-02-24 NOTE — Assessment & Plan Note (Signed)
Long standing and asymptomatic

## 2023-02-25 ENCOUNTER — Telehealth (INDEPENDENT_AMBULATORY_CARE_PROVIDER_SITE_OTHER): Payer: Medicare Other | Admitting: Family Medicine

## 2023-02-25 DIAGNOSIS — G4700 Insomnia, unspecified: Secondary | ICD-10-CM

## 2023-02-25 DIAGNOSIS — F1010 Alcohol abuse, uncomplicated: Secondary | ICD-10-CM | POA: Diagnosis not present

## 2023-02-25 DIAGNOSIS — F418 Other specified anxiety disorders: Secondary | ICD-10-CM

## 2023-02-25 DIAGNOSIS — D693 Immune thrombocytopenic purpura: Secondary | ICD-10-CM

## 2023-02-25 NOTE — Assessment & Plan Note (Signed)
Encouraged good sleep hygiene such as dark, quiet room. No blue/green glowing lights such as computer screens in bedroom. No alcohol or stimulants in evening. Cut down on caffeine as able. Regular exercise is helpful but not just prior to bed time.  

## 2023-02-25 NOTE — Assessment & Plan Note (Signed)
Drinking very little and feeling well

## 2023-04-08 ENCOUNTER — Other Ambulatory Visit: Payer: Self-pay | Admitting: Family Medicine

## 2023-07-04 ENCOUNTER — Other Ambulatory Visit: Payer: Self-pay | Admitting: Family Medicine

## 2023-07-04 NOTE — Telephone Encounter (Signed)
Requesting: alprazolam 0.25mg   Contract: 02/04/2018 UDS: 02/04/2018 Last Visit: 02/25/23 Next Visit: 08/26/23 Last Refill: 11/22/22 #60 and 5RF  Please Advise

## 2023-07-18 ENCOUNTER — Encounter (INDEPENDENT_AMBULATORY_CARE_PROVIDER_SITE_OTHER): Payer: Self-pay

## 2023-08-25 NOTE — Assessment & Plan Note (Signed)
Stable and asymptomatic will continue to monitor

## 2023-08-25 NOTE — Assessment & Plan Note (Signed)
Encouraged increased hydration, 64 ounces of clear fluids daily. Minimize alcohol and caffeine. Eat small frequent meals with lean proteins and complex carbs. Avoid high and low blood sugars. Get adequate sleep, 7-8 hours a night. Needs exercise daily preferably in the morning.

## 2023-08-25 NOTE — Assessment & Plan Note (Signed)
Encourage heart healthy diet such as MIND or DASH diet, increase exercise, avoid trans fats, simple carbohydrates and processed foods, consider a krill or fish or flaxseed oil cap daily.

## 2023-08-25 NOTE — Assessment & Plan Note (Signed)
Encouraged good sleep hygiene such as dark, quiet room. No blue/green glowing lights such as computer screens in bedroom. No alcohol or stimulants in evening. Cut down on caffeine as able. Regular exercise is helpful but not just prior to bed time.

## 2023-08-26 ENCOUNTER — Encounter: Payer: Self-pay | Admitting: Family Medicine

## 2023-08-26 ENCOUNTER — Ambulatory Visit (INDEPENDENT_AMBULATORY_CARE_PROVIDER_SITE_OTHER): Payer: Medicare Other | Admitting: Family Medicine

## 2023-08-26 ENCOUNTER — Other Ambulatory Visit (HOSPITAL_BASED_OUTPATIENT_CLINIC_OR_DEPARTMENT_OTHER): Payer: Self-pay

## 2023-08-26 VITALS — BP 130/74 | HR 68 | Temp 97.8°F | Resp 16 | Ht 67.0 in | Wt 198.8 lb

## 2023-08-26 DIAGNOSIS — E2839 Other primary ovarian failure: Secondary | ICD-10-CM | POA: Diagnosis not present

## 2023-08-26 DIAGNOSIS — Z0000 Encounter for general adult medical examination without abnormal findings: Secondary | ICD-10-CM | POA: Diagnosis not present

## 2023-08-26 DIAGNOSIS — Z780 Asymptomatic menopausal state: Secondary | ICD-10-CM | POA: Diagnosis not present

## 2023-08-26 DIAGNOSIS — Z1239 Encounter for other screening for malignant neoplasm of breast: Secondary | ICD-10-CM

## 2023-08-26 DIAGNOSIS — F418 Other specified anxiety disorders: Secondary | ICD-10-CM

## 2023-08-26 DIAGNOSIS — G4700 Insomnia, unspecified: Secondary | ICD-10-CM

## 2023-08-26 DIAGNOSIS — D693 Immune thrombocytopenic purpura: Secondary | ICD-10-CM

## 2023-08-26 DIAGNOSIS — E782 Mixed hyperlipidemia: Secondary | ICD-10-CM

## 2023-08-26 DIAGNOSIS — Z23 Encounter for immunization: Secondary | ICD-10-CM | POA: Diagnosis not present

## 2023-08-26 MED ORDER — TETANUS-DIPHTH-ACELL PERTUSSIS 5-2.5-18.5 LF-MCG/0.5 IM SUSY
0.5000 mL | PREFILLED_SYRINGE | Freq: Once | INTRAMUSCULAR | 0 refills | Status: AC
  Filled 2023-08-26: qty 0.5, 1d supply, fill #0

## 2023-08-26 NOTE — Assessment & Plan Note (Signed)
Stable on fluoxetine and alprazolam prn, no changes

## 2023-08-26 NOTE — Patient Instructions (Addendum)
Netflix Live to 100 the Blue Zones   4000, 8000   Shingrix is the new shingles shot, 2 shots over 2-6 months, confirm coverage with insurance and document, then can return here for shots with nurse appt or at pharmacy   Prevnar 20 at pharmacy  Tetanus at pharmacy  Covid and flu boosters  Preventive Care 65 Years and Older, Female Preventive care refers to lifestyle choices and visits with your health care provider that can promote health and wellness. Preventive care visits are also called wellness exams. What can I expect for my preventive care visit? Counseling Your health care provider may ask you questions about your: Medical history, including: Past medical problems. Family medical history. Pregnancy and menstrual history. History of falls. Current health, including: Memory and ability to understand (cognition). Emotional well-being. Home life and relationship well-being. Sexual activity and sexual health. Lifestyle, including: Alcohol, nicotine or tobacco, and drug use. Access to firearms. Diet, exercise, and sleep habits. Work and work Astronomer. Sunscreen use. Safety issues such as seatbelt and bike helmet use. Physical exam Your health care provider will check your: Height and weight. These may be used to calculate your BMI (body mass index). BMI is a measurement that tells if you are at a healthy weight. Waist circumference. This measures the distance around your waistline. This measurement also tells if you are at a healthy weight and may help predict your risk of certain diseases, such as type 2 diabetes and high blood pressure. Heart rate and blood pressure. Body temperature. Skin for abnormal spots. What immunizations do I need?  Vaccines are usually given at various ages, according to a schedule. Your health care provider will recommend vaccines for you based on your age, medical history, and lifestyle or other factors, such as travel or where you  work. What tests do I need? Screening Your health care provider may recommend screening tests for certain conditions. This may include: Lipid and cholesterol levels. Hepatitis C test. Hepatitis B test. HIV (human immunodeficiency virus) test. STI (sexually transmitted infection) testing, if you are at risk. Lung cancer screening. Colorectal cancer screening. Diabetes screening. This is done by checking your blood sugar (glucose) after you have not eaten for a while (fasting). Mammogram. Talk with your health care provider about how often you should have regular mammograms. BRCA-related cancer screening. This may be done if you have a family history of breast, ovarian, tubal, or peritoneal cancers. Bone density scan. This is done to screen for osteoporosis. Talk with your health care provider about your test results, treatment options, and if necessary, the need for more tests. Follow these instructions at home: Eating and drinking  Eat a diet that includes fresh fruits and vegetables, whole grains, lean protein, and low-fat dairy products. Limit your intake of foods with high amounts of sugar, saturated fats, and salt. Take vitamin and mineral supplements as recommended by your health care provider. Do not drink alcohol if your health care provider tells you not to drink. If you drink alcohol: Limit how much you have to 0-1 drink a day. Know how much alcohol is in your drink. In the [place] one drink equals one 12 oz bottle of beer (355 mL), one 5 oz glass of wine (148 mL), or one 1 oz glass of hard liquor (44 mL). Lifestyle Brush your teeth every morning and night with fluoride toothpaste. Floss one time each day. Exercise for at least 30 minutes 5 or more days each week. Do not use any products  that contain nicotine or tobacco. These products include cigarettes, chewing tobacco, and vaping devices, such as e-cigarettes. If you need help quitting, ask your health care provider. Do not  use drugs. If you are sexually active, practice safe sex. Use a condom or other form of protection in order to prevent STIs. Take aspirin only as told by your health care provider. Make sure that you understand how much to take and what form to take. Work with your health care provider to find out whether it is safe and beneficial for you to take aspirin daily. Ask your health care provider if you need to take a cholesterol-lowering medicine (statin). Find healthy ways to manage stress, such as: Meditation, yoga, or listening to music. Journaling. Talking to a trusted person. Spending time with friends and family. Minimize exposure to UV radiation to reduce your risk of skin cancer. Safety Always wear your seat belt while driving or riding in a vehicle. Do not drive: If you have been drinking alcohol. Do not ride with someone who has been drinking. When you are tired or distracted. While texting. If you have been using any mind-altering substances or drugs. Wear a helmet and other protective equipment during sports activities. If you have firearms in your house, make sure you follow all gun safety procedures. What's next? Visit your health care provider once a year for an annual wellness visit. Ask your health care provider how often you should have your eyes and teeth checked. Stay up to date on all vaccines. This information is not intended to replace advice given to you by your health care provider. Make sure you discuss any questions you have with your health care provider. Document Revised: 05/17/2021 Document Reviewed: 05/17/2021 Elsevier Patient Education  2024 [employer].

## 2023-08-26 NOTE — Progress Notes (Signed)
Subjective:    [name], female    DOB: 1956/04/27, 67 y.o.   MRN: [id number]  Chief Complaint  Patient presents with   Annual Exam    Annual Exam    HPI Discussed the use of AI scribe software for clinical note transcription with the patient, who gave verbal consent to proceed.  History of Present Illness   The patient, with a history of anxiety, presents with increased fatigue, particularly during the hot summer months. The patient reports a slight improvement in energy levels with the onset of cooler weather. The patient also experiences occasional shortness of breath with strenuous activity but has no other significant respiratory symptoms. The patient manages anxiety with medication but has recently started using CBD due to the grogginess caused by the medication. The patient also reports persistent tinnitus and some discomfort in the left ear. The patient has frequent urination but no other urinary symptoms. The patient has not received the COVID-19 vaccine but is open to other vaccinations.        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  '95- blow normal levels, but stable.   Lipoma 03-04-12   Rt. back about scapular ares   Migraine    "that's what the dr thinks I had yesterday; still unsure; never had one before" (06/19/2016)   Overweight 03/06/2015   PONV (postoperative nausea and vomiting)    Preventative health care 04/17/2013; 08/17/2013    Solitary pulmonary nodule 08/17/2013   LLL seen on CT in March 2014    Past Surgical History:  Procedure Laterality Date   APPENDECTOMY  1995   BREAST BIOPSY Left 2012   needle biopsy(benign)-Titanium needle marker remains   CHOLECYSTECTOMY N/A 04/24/2013   Procedure: LAPAROSCOPIC CHOLECYSTECTOMY WITH INTRAOPERATIVE CHOLANGIOGRAM;  Surgeon: Currie Paris, MD;  Location: [facility];  Service: General;  Laterality: N/A;   COLONOSCOPY  2008   WNL, Dr Buccini   IRRIGATION AND DEBRIDEMENT ABSCESS  ~ 2005 X 2   buttocks   LIPOMA EXCISION  ~ 04/2016   "base of my neck"   MASS EXCISION  03/10/2012   Procedure: EXCISION MASS;  Surgeon: Adolph Pollack, MD;  Location: [facility];  Service: General;  Laterality: N/A;  Removal of back lipoma   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   SPLENECTOMY, TOTAL  1995   TOTAL ABDOMINAL HYSTERECTOMY  1995   total, endometriosis, fibroid, ovarian atrophy    Family History  Problem Relation Age of Onset   Heart disease Maternal Grandmother    Heart disease Maternal Grandfather    Heart attack Maternal Grandfather 62   Heart disease Paternal Grandmother    Heart attack Paternal Grandmother 20   Heart disease Paternal Grandfather    Cancer Paternal Grandfather        chest- smoker   Diabetes Mother        type 2- controlled by  diet   Atrial fibrillation Father    Hyperlipidemia Sister    Migraines Sister    Cancer Sister        pancreatic   Depression Sister    COPD Sister     Social History   Socioeconomic History   Marital status: Single    Spouse name: Not on file   Number of children: 0   Years of education: 12   Highest education level: Not on file  Occupational History   Not on file  Tobacco Use   Smoking status: Former    Current packs/day: 0.00    Average packs/day: 0.8 packs/day for 5.0 years (3.8 ttl pk-yrs)    Types: Cigarettes    Start date: 02/20/1978    Quit date: 02/21/1983    Years since quitting: 40.5    Smokeless tobacco: Never  Substance and Sexual Activity   Alcohol use: Yes    Alcohol/week: 12.0 standard drinks of alcohol    Types: 12 Cans of beer per week   Drug use: No   Sexual activity: Never    Comment: works as Print production planner at [employer], 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   Social Determinants of Health   Financial Resource Strain: Low Risk  (01/02/2023)   Overall Financial Resource Strain (CARDIA)    Difficulty of Paying Living Expenses: Not hard at all  Food Insecurity: No Food Insecurity (01/02/2023)   Hunger Vital Sign    Worried About Running Out of Food in the Last Year: Never true    Ran Out of Food in the Last Year: Never true  Transportation Needs: No Transportation Needs (01/02/2023)   PRAPARE - Administrator, Civil Service (Medical): No    Lack of Transportation (Non-Medical): No  Physical Activity: Insufficiently Active (01/02/2023)   Exercise Vital Sign    Days of Exercise per Week: 4 days    Minutes of Exercise per Session: 30 min  Stress: No Stress Concern Present (01/02/2023)   [employer] of Occupational Health - Occupational Stress Questionnaire    Feeling of Stress : Not at all  Social Connections: Socially Isolated (01/02/2023)   Social Connection and Isolation Panel [NHANES]    Frequency of Communication with Friends and Family: Once a week    Frequency of Social Gatherings with Friends and Family: Once a week    Attends Religious Services: Never    Database administrator or Organizations: No    Attends Banker Meetings: Never    Marital Status: Never married  Intimate Partner Violence: Not At Risk (01/02/2023)   Humiliation, Afraid, Rape, and Kick questionnaire    Fear of Current or Ex-Partner: No    Emotionally Abused: No     Physically Abused: No    Sexually Abused: No    Outpatient Medications Prior to Visit  Medication Sig Dispense Refill   acetaminophen (TYLENOL) 500 MG tablet Take 1,000 mg by mouth every 6 (six) hours as needed for pain.     ALPRAZolam (XANAX) 0.25 MG tablet TAKE 1 TABLET(0.25 MG) BY MOUTH TWICE DAILY AS NEEDED FOR ANXIETY 60 tablet 2   Calcium Carb-Cholecalciferol (CALCIUM 600 + D PO) Take 1 tablet by mouth daily.     cetirizine (ZYRTEC)  10 MG tablet Take 1 tablet (10 mg total) by mouth daily as needed for allergies or rhinitis. 90 tablet 1   Cholecalciferol (VITAMIN D) 2000 units CAPS Take 2,000 Units by mouth daily.     docusate sodium (COLACE) 100 MG capsule Take 100 mg by mouth daily.     FLUoxetine (PROZAC) 20 MG capsule Take 3 capsules (60 mg total) by mouth daily. 270 capsule 0   fluticasone (FLONASE) 50 MCG/ACT nasal spray Place 1 spray into both nostrils daily. 48 g 1   meclizine (ANTIVERT) 25 MG tablet Take 1 tablet (25 mg total) by mouth 3 (three) times daily as needed for dizziness. 30 tablet 0   omeprazole (PRILOSEC) 20 MG capsule Take 1 capsule (20 mg total) by mouth daily. 90 capsule 1   topiramate (TOPAMAX) 25 MG tablet TAKE 1 TABLET(25 MG) BY MOUTH DAILY 90 tablet 1   traZODone (DESYREL) 50 MG tablet TAKE 2 TABLETS(100 MG) BY MOUTH AT BEDTIME AS NEEDED FOR SLEEP 180 tablet 1   COVID-19 mRNA vaccine 2023-2024 (COMIRNATY) syringe Inject into the muscle. 0.3 mL 0   influenza vaccine adjuvanted (FLUAD QUADRIVALENT) 0.5 ML injection Inject into the muscle. 0.5 mL 0   No facility-administered medications prior to visit.    Allergies  Allergen Reactions   Atorvastatin Other (See Comments)    Joint pain and weight gain   Codeine Nausea And Vomiting   Wellbutrin [Bupropion] Anxiety    Insomnia     Review of Systems  Constitutional:  Negative for fever and malaise/fatigue.  HENT:  Negative for congestion.   Eyes:  Negative for blurred vision.  Respiratory:  Positive  for shortness of breath. Negative for sputum production and wheezing.   Cardiovascular:  Negative for chest pain, palpitations and leg swelling.  Gastrointestinal:  Negative for abdominal pain, blood in stool and nausea.  Genitourinary:  Negative for dysuria and frequency.  Musculoskeletal:  Negative for falls.  Skin:  Negative for rash.  Neurological:  Negative for dizziness, loss of consciousness and headaches.  Endo/Heme/Allergies:  Negative for environmental allergies.  Psychiatric/Behavioral:  Negative for depression. The patient is nervous/anxious.        Objective:    Physical Exam Constitutional:      General: She is not in acute distress.    Appearance: Normal appearance. She is not diaphoretic.  HENT:     Head: Normocephalic and atraumatic.     Right Ear: Tympanic membrane, ear canal and external ear normal.     Left Ear: Tympanic membrane, ear canal and external ear normal.     Nose: Nose normal.     Mouth/Throat:     Mouth: Mucous membranes are moist.     Pharynx: Oropharynx is clear. No oropharyngeal exudate.  Eyes:     General: No scleral icterus.       Right eye: No discharge.        Left eye: No discharge.     Conjunctiva/sclera: Conjunctivae normal.     Pupils: Pupils are equal, round, and reactive to light.  Neck:     Thyroid: No thyromegaly.  Cardiovascular:     Rate and Rhythm: Normal rate and regular rhythm.     Heart sounds: Normal heart sounds. No murmur heard. Pulmonary:     Effort: Pulmonary effort is normal. No respiratory distress.     Breath sounds: Normal breath sounds. No wheezing or rales.  Abdominal:     General: Bowel sounds are normal. There is no distension.  Palpations: Abdomen is soft. There is no mass.     Tenderness: There is no abdominal tenderness.  Musculoskeletal:        General: No tenderness. Normal range of motion.     Cervical back: Normal range of motion and neck supple.  Lymphadenopathy:     Cervical: No cervical  adenopathy.  Skin:    General: Skin is warm and dry.     Findings: No rash.  Neurological:     General: No focal deficit present.     Mental Status: She is alert and oriented to person, place, and time.     Cranial Nerves: No cranial nerve deficit.     Coordination: Coordination normal.     Deep Tendon Reflexes: Reflexes are normal and symmetric. Reflexes normal.  Psychiatric:        Mood and Affect: Mood normal.        Behavior: Behavior normal.        Thought Content: Thought content normal.        Judgment: Judgment normal.     BP 130/74 (BP Location: Left Arm, Patient Position: Sitting, Cuff Size: Normal)   Pulse 68   Temp 97.8 F (36.6 C) (Oral)   Resp 16   Ht 5\' 7"  (1.702 m)   Wt 198 lb 12.8 oz (90.2 kg)   SpO2 98%   BMI 31.14 kg/m  Wt Readings from Last 3 Encounters:  08/26/23 198 lb 12.8 oz (90.2 kg)  01/02/23 194 lb (88 kg)  11/22/22 194 lb 9.6 oz (88.3 kg)    Diabetic Foot Exam - Simple   No data filed    Lab Results  Component Value Date   WBC 4.8 05/21/2022   HGB 13.6 05/21/2022   HCT 41.2 05/21/2022   PLT 48.0 Repeated and verified X2. (LL) 05/21/2022   GLUCOSE 89 05/21/2022   CHOL 205 (H) 03/03/2021   TRIG 105.0 03/03/2021   HDL 63.20 03/03/2021   LDLDIRECT 114.0 01/19/2020   LDLCALC 121 (H) 03/03/2021   ALT 13 05/21/2022   AST 17 05/21/2022   NA 137 05/21/2022   K 4.4 05/21/2022   CL 106 05/21/2022   CREATININE 1.03 05/21/2022   BUN 19 05/21/2022   CO2 28 05/21/2022   TSH 2.49 05/21/2022   INR 0.89 05/14/2018   HGBA1C 5.2 05/21/2022    Lab Results  Component Value Date   TSH 2.49 05/21/2022   Lab Results  Component Value Date   WBC 4.8 05/21/2022   HGB 13.6 05/21/2022   HCT 41.2 05/21/2022   MCV 96.4 05/21/2022   PLT 48.0 Repeated and verified X2. (LL) 05/21/2022   Lab Results  Component Value Date   NA 137 05/21/2022   K 4.4 05/21/2022   CO2 28 05/21/2022   GLUCOSE 89 05/21/2022   BUN 19 05/21/2022   CREATININE 1.03  05/21/2022   BILITOT 0.4 05/21/2022   ALKPHOS 91 05/21/2022   AST 17 05/21/2022   ALT 13 05/21/2022   PROT 7.5 05/21/2022   ALBUMIN 4.1 05/21/2022   CALCIUM 9.5 05/21/2022   ANIONGAP 7 06/19/2016   GFR 57.10 (L) 05/21/2022   Lab Results  Component Value Date   CHOL 205 (H) 03/03/2021   Lab Results  Component Value Date   HDL 63.20 03/03/2021   Lab Results  Component Value Date   LDLCALC 121 (H) 03/03/2021   Lab Results  Component Value Date   TRIG 105.0 03/03/2021   Lab Results  Component Value Date  CHOLHDL 3 03/03/2021   Lab Results  Component Value Date   HGBA1C 5.2 05/21/2022       Assessment & Plan:  Preventative health care Assessment & Plan: Patient encouraged to maintain heart healthy diet, regular exercise, adequate sleep. Consider daily probiotics. Take medications as prescribed. Labs reviewed. Dexa scan ordered today. Shingrix is the new shingles shot, 2 shots over 2-6 months. Flu and covid booster.  Colonoscopy 2017 repeat in 2027 Pap TAH in mid 30s no paps presently Mgm ordered Dexa ordered Shingrix is the new shingles shot, 2 shots over 2-6 months, confirm coverage with insurance and document, then can return here for shots with nurse appt or at pharmacy       Hyperlipidemia, mixed Assessment & Plan: Encourage heart healthy diet such as MIND or DASH diet, increase exercise, avoid trans fats, simple carbohydrates and processed foods, consider a krill or fish or flaxseed oil cap daily.   Orders: -     Comprehensive metabolic panel -     Lipid panel -     TSH  Idiopathic thrombocytopenia (HCC) Assessment & Plan: Stable and asymptomatic will continue to monitor  Orders: -     CBC with Differential/Platelet -     TSH  Insomnia, unspecified type Assessment & Plan: Encouraged good sleep hygiene such as dark, quiet room. No blue/green glowing lights such as computer screens in bedroom. No alcohol or stimulants in evening. Cut down on  caffeine as able. Regular exercise is helpful but not just prior to bed time.    Orders: -     TSH  Estrogen deficiency -     DG Bone Density; Future -     TSH  Post-menopausal -     DG Bone Density; Future  Encounter for screening for malignant neoplasm of breast, unspecified screening modality -     3D Screening Mammogram, Left and Right; Future  Need for influenza vaccination -     Flu Vaccine Trivalent High Dose (Fluad)  Depression with anxiety Assessment & Plan: Stable on fluoxetine and alprazolam prn, no changes     Assessment and Plan    Fatigue Likely related to heat intolerance due to age-related decrease in muscle mass and water storage. Improvement noted with cooler weather. -Continue current management strategies including hydration and avoiding excessive heat.  Shortness of breath on exertion No shortness of breath at rest. Echocardiogram from previous year was normal. -Order basic blood work including kidney and liver panels, cholesterol, and thyroid function tests to rule out systemic causes.  Frequent urination No new urinary symptoms. Discussed age-related changes in bladder function. -Consider referral to a urogynecologist if symptoms worsen or become bothersome.  Left ear discomfort and tinnitus No obvious signs of infection or inflammation on examination. Chronic tinnitus present. -Consider referral to an ENT specialist if symptoms persist or worsen.  General Health Maintenance -Administer influenza vaccine today. -Order bone density scan, mammogram, and colonoscopy for routine screening. -Discussed importance of daily intake of vitamin D and calcium. Recommended Citracal [place]it for easier swallowing. -Discussed importance of shingles vaccination and its potential link to decreased risk of dementia. Patient to consider. -Order Prevnar 20 for pneumococcal pneumonia protection. -Discussed potential need for tetanus booster. Patient can obtain at  pharmacy. -Plan for COVID booster in the future, timing dependent on cold and flu season. -Follow-up in six months, potentially virtually, with labs to be done in person.         Danise Edge, MD

## 2023-08-26 NOTE — Assessment & Plan Note (Signed)
Patient encouraged to maintain heart healthy diet, regular exercise, adequate sleep. Consider daily probiotics. Take medications as prescribed. Labs reviewed. Dexa scan ordered today. Shingrix is the new shingles shot, 2 shots over 2-6 months. Flu and covid booster.  Colonoscopy 2017 repeat in 2027 Pap TAH in mid 30s no paps presently Mgm ordered Dexa ordered Shingrix is the new shingles shot, 2 shots over 2-6 months, confirm coverage with insurance and document, then can return here for shots with nurse appt or at pharmacy

## 2023-08-27 LAB — CBC WITH DIFFERENTIAL/PLATELET
Basophils Absolute: 0.1 10*3/uL (ref 0.0–0.1)
Basophils Relative: 1.2 % (ref 0.0–3.0)
Eosinophils Absolute: 0.1 10*3/uL (ref 0.0–0.7)
Eosinophils Relative: 1.1 % (ref 0.0–5.0)
HCT: 40.1 % (ref 36.0–46.0)
Hemoglobin: 13 g/dL (ref 12.0–15.0)
Lymphocytes Relative: 35.8 % (ref 12.0–46.0)
Lymphs Abs: 2.2 10*3/uL (ref 0.7–4.0)
MCHC: 32.5 g/dL (ref 30.0–36.0)
MCV: 99.8 fl (ref 78.0–100.0)
Monocytes Absolute: 0.9 10*3/uL (ref 0.1–1.0)
Monocytes Relative: 14 % — ABNORMAL HIGH (ref 3.0–12.0)
Neutro Abs: 3 10*3/uL (ref 1.4–7.7)
Neutrophils Relative %: 47.9 % (ref 43.0–77.0)
Platelets: 56 10*3/uL — ABNORMAL LOW (ref 150.0–400.0)
RBC: 4.01 Mil/uL (ref 3.87–5.11)
RDW: 13.5 % (ref 11.5–15.5)
WBC: 6.2 10*3/uL (ref 4.0–10.5)

## 2023-08-27 LAB — COMPREHENSIVE METABOLIC PANEL
ALT: 10 U/L (ref 0–35)
AST: 14 U/L (ref 0–37)
Albumin: 4.1 g/dL (ref 3.5–5.2)
Alkaline Phosphatase: 93 U/L (ref 39–117)
BUN: 17 mg/dL (ref 6–23)
CO2: 27 mEq/L (ref 19–32)
Calcium: 9.2 mg/dL (ref 8.4–10.5)
Chloride: 105 mEq/L (ref 96–112)
Creatinine, Ser: 1 mg/dL (ref 0.40–1.20)
GFR: 58.64 mL/min — ABNORMAL LOW (ref >60.00)
Glucose, Bld: 91 mg/dL (ref 70–99)
Potassium: 3.7 mEq/L (ref 3.5–5.1)
Sodium: 139 mEq/L (ref 135–145)
Total Bilirubin: 0.4 mg/dL (ref 0.2–1.2)
Total Protein: 7.2 g/dL (ref 6.0–8.3)

## 2023-08-27 LAB — LIPID PANEL
Cholesterol: 201 mg/dL — ABNORMAL HIGH (ref 0–200)
HDL: 58.1 mg/dL (ref >39.00)
LDL Cholesterol: 100 mg/dL — ABNORMAL HIGH (ref 0–99)
NonHDL: 142.6
Total CHOL/HDL Ratio: 3
Triglycerides: 213 mg/dL — ABNORMAL HIGH (ref 0.0–149.0)
VLDL: 42.6 mg/dL — ABNORMAL HIGH (ref 0.0–40.0)

## 2023-08-27 LAB — TSH: TSH: 1.5 u[IU]/mL (ref 0.35–5.50)

## 2023-09-29 ENCOUNTER — Other Ambulatory Visit: Payer: Self-pay | Admitting: Family Medicine

## 2023-10-07 DIAGNOSIS — K080 Exfoliation of teeth due to systemic causes: Secondary | ICD-10-CM | POA: Diagnosis not present

## 2023-10-10 ENCOUNTER — Inpatient Hospital Stay (HOSPITAL_BASED_OUTPATIENT_CLINIC_OR_DEPARTMENT_OTHER): Admission: RE | Admit: 2023-10-10 | Payer: Medicare Other | Source: Ambulatory Visit

## 2023-10-10 ENCOUNTER — Other Ambulatory Visit (HOSPITAL_BASED_OUTPATIENT_CLINIC_OR_DEPARTMENT_OTHER): Payer: Medicare Other

## 2023-10-22 DIAGNOSIS — K080 Exfoliation of teeth due to systemic causes: Secondary | ICD-10-CM | POA: Diagnosis not present

## 2023-10-28 ENCOUNTER — Other Ambulatory Visit (HOSPITAL_BASED_OUTPATIENT_CLINIC_OR_DEPARTMENT_OTHER): Payer: Self-pay

## 2023-10-28 ENCOUNTER — Ambulatory Visit (HOSPITAL_BASED_OUTPATIENT_CLINIC_OR_DEPARTMENT_OTHER)
Admission: RE | Admit: 2023-10-28 | Discharge: 2023-10-28 | Disposition: A | Discharge status: Home / Self Care | Payer: Medicare Other | Source: Ambulatory Visit | Attending: Family Medicine | Admitting: Family Medicine

## 2023-10-28 ENCOUNTER — Encounter (HOSPITAL_BASED_OUTPATIENT_CLINIC_OR_DEPARTMENT_OTHER): Payer: Self-pay

## 2023-10-28 DIAGNOSIS — Z780 Asymptomatic menopausal state: Secondary | ICD-10-CM | POA: Diagnosis not present

## 2023-10-28 DIAGNOSIS — Z87891 Personal history of nicotine dependence: Secondary | ICD-10-CM | POA: Diagnosis not present

## 2023-10-28 DIAGNOSIS — M8588 Other specified disorders of bone density and structure, other site: Secondary | ICD-10-CM | POA: Insufficient documentation

## 2023-10-28 DIAGNOSIS — Z1239 Encounter for other screening for malignant neoplasm of breast: Secondary | ICD-10-CM | POA: Insufficient documentation

## 2023-10-28 DIAGNOSIS — E2839 Other primary ovarian failure: Secondary | ICD-10-CM | POA: Insufficient documentation

## 2023-10-28 DIAGNOSIS — Z7952 Long term (current) use of systemic steroids: Secondary | ICD-10-CM | POA: Diagnosis not present

## 2023-10-28 DIAGNOSIS — M8589 Other specified disorders of bone density and structure, multiple sites: Secondary | ICD-10-CM | POA: Diagnosis not present

## 2023-10-28 DIAGNOSIS — Z1231 Encounter for screening mammogram for malignant neoplasm of breast: Secondary | ICD-10-CM | POA: Diagnosis not present

## 2023-10-28 MED ORDER — RSVPREF3 VAC RECOMB ADJUVANTED 120 MCG/0.5ML IM SUSR
0.5000 mL | Freq: Once | INTRAMUSCULAR | 0 refills | Status: AC
  Filled 2023-10-28: qty 0.5, 1d supply, fill #0

## 2023-11-19 ENCOUNTER — Other Ambulatory Visit: Payer: Self-pay | Admitting: Family Medicine

## 2023-12-09 DIAGNOSIS — K080 Exfoliation of teeth due to systemic causes: Secondary | ICD-10-CM | POA: Diagnosis not present

## 2023-12-15 ENCOUNTER — Other Ambulatory Visit: Payer: Self-pay | Admitting: Family Medicine

## 2023-12-16 NOTE — Telephone Encounter (Signed)
 Requesting: alprazolam 0.25mg  Contract: Yes 08/26/23 UDS: no last done 2019 Last Visit: 08/26/2023 Next Visit: 02/18/2024 Last Refill: 07/04/23  Please Advise

## 2023-12-29 ENCOUNTER — Other Ambulatory Visit: Payer: Self-pay | Admitting: Family Medicine

## 2024-01-06 DIAGNOSIS — K080 Exfoliation of teeth due to systemic causes: Secondary | ICD-10-CM | POA: Diagnosis not present

## 2024-01-07 ENCOUNTER — Ambulatory Visit (INDEPENDENT_AMBULATORY_CARE_PROVIDER_SITE_OTHER): Payer: Medicare Other

## 2024-01-07 VITALS — Ht 67.0 in | Wt 190.0 lb

## 2024-01-07 DIAGNOSIS — Z0000 Encounter for general adult medical examination without abnormal findings: Secondary | ICD-10-CM | POA: Diagnosis not present

## 2024-01-07 NOTE — Patient Instructions (Addendum)
 Laura Bender , Thank you for taking time to come for your Medicare Wellness Visit. I appreciate your ongoing commitment to your health goals. Please review the following plan we discussed and let me know if I can assist you in the future.   Referrals/Orders/Follow-Ups/Clinician Recommendations:   This is a list of the screening recommended for you and due dates:  Health Maintenance  Topic Date Due   Zoster (Shingles) Vaccine (1 of 2) Never done   COVID-19 Vaccine (3 - 2024-25 season) 08/04/2023   Pneumonia Vaccine (3 of 3 - PPSV23 or PCV20) 07/15/2024   Medicare Annual Wellness Visit  01/06/2025   Mammogram  10/27/2025   Colon Cancer Screening  02/16/2026   DTaP/Tdap/Td vaccine (3 - Td or Tdap) 08/25/2033   Flu Shot  Completed   DEXA scan (bone density measurement)  Completed   Hepatitis C Screening  Completed   HPV Vaccine  Aged Out    Advanced directives: (Copy Requested) Please bring a copy of your health care power of attorney and living will to the office to be added to your chart at your convenience.  Next Medicare Annual Wellness Visit scheduled for next year:

## 2024-01-07 NOTE — Progress Notes (Signed)
 Subjective:   Laura Bender is a 68 y.o. female who presents for Medicare Annual (Subsequent) preventive examination.  Visit Complete: Virtual I connected with  KOBI ALLER on 01/07/24 by a audio enabled telemedicine application and verified that I am speaking with the correct person using two identifiers.  Patient Location: Home  Provider Location: Home Office  I discussed the limitations of evaluation and management by telemedicine. The patient expressed understanding and agreed to proceed.  Vital Signs: Because this visit was a virtual/telehealth visit, some criteria may be missing or patient reported. Any vitals not documented were not able to be obtained and vitals that have been documented are patient reported.   Cardiac Risk Factors include: advanced age (>77men, >75 women)     Objective:    Today's Vitals   01/07/24 1132  Weight: 190 lb (86.2 kg)  Height: 5' 7 (1.702 m)   Body mass index is 29.76 kg/m.     01/07/2024   11:38 AM 01/02/2023   10:36 AM 12/05/2018    3:03 PM 05/14/2018   11:23 AM 02/28/2018    2:58 PM 06/19/2016   12:05 PM 02/13/2016    1:50 PM  Advanced Directives  Does Patient Have a Medical Advance Directive? Yes Yes No Yes Yes No No  Type of Estate Agent of [place];Living will Healthcare Power of [place];Living will  Healthcare Power of [place];Living will Healthcare Power of [place];Living will    Does patient want to make changes to medical advance directive?    No - Patient declined No - Patient declined    Copy of Healthcare Power of Attorney in Chart? No - copy requested No - copy requested  No - copy requested     Would patient like information on creating a medical advance directive?      No - patient declined information No - patient declined information    Current Medications (verified) Outpatient Encounter Medications as of 01/07/2024  Medication Sig   acetaminophen  (TYLENOL ) 500 MG tablet Take 1,000 mg by  mouth every 6 (six) hours as needed for pain.   ALPRAZolam  (XANAX ) 0.25 MG tablet TAKE 1 TABLET(0.25 MG) BY MOUTH TWICE DAILY AS NEEDED FOR ANXIETY   Calcium  Carb-Cholecalciferol (CALCIUM  600 + D PO) Take 1 tablet by mouth daily.   cetirizine  (ZYRTEC ) 10 MG tablet Take 1 tablet (10 mg total) by mouth daily as needed for allergies or rhinitis.   Cholecalciferol (VITAMIN D) 2000 units CAPS Take 2,000 Units by mouth daily.   docusate sodium  (COLACE) 100 MG capsule Take 100 mg by mouth daily.   FLUoxetine  (PROZAC ) 20 MG capsule Take 3 capsules (60 mg total) by mouth daily.   fluticasone  (FLONASE ) 50 MCG/ACT nasal spray Place 1 spray into both nostrils daily.   meclizine  (ANTIVERT ) 25 MG tablet Take 1 tablet (25 mg total) by mouth 3 (three) times daily as needed for dizziness.   omeprazole  (PRILOSEC) 20 MG capsule Take 1 capsule (20 mg total) by mouth daily.   topiramate  (TOPAMAX ) 25 MG tablet TAKE 1 TABLET(25 MG) BY MOUTH DAILY   traZODone  (DESYREL ) 50 MG tablet TAKE 2 TABLETS(100 MG) BY MOUTH AT BEDTIME AS NEEDED FOR SLEEP   No facility-administered encounter medications on file as of 01/07/2024.    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 meds   Depression with anxiety 04/17/2013   ETOH abuse    Fibromyalgia 03-04-12   Sporadic pain   GERD (gastroesophageal reflux disease) 08/17/2013   History of transfusion of platelets X 1   related to ITP   Hyperlipidemia, mixed 10/16/2015   ITP (idiopathic thrombocytopenic purpura) 03-04-12   Dx.  '95- blow normal levels, but stable.   Lipoma 03-04-12   Rt. back about scapular ares   Migraine    that's what the dr thinks I had yesterday; still unsure; never  had one before (06/19/2016)   Overweight 03/06/2015   PONV (postoperative nausea and vomiting)    Preventative health care 04/17/2013; 08/17/2013   Solitary pulmonary nodule 08/17/2013   LLL seen on CT in March 2014   Past Surgical History:  Procedure Laterality Date   APPENDECTOMY  1995   BREAST BIOPSY Left 2012   needle biopsy(benign)-Titanium needle marker remains   CHOLECYSTECTOMY N/A 04/24/2013   Procedure: LAPAROSCOPIC CHOLECYSTECTOMY WITH INTRAOPERATIVE CHOLANGIOGRAM;  Surgeon: Sherlean JINNY Laughter, MD;  Location: [facility];  Service: General;  Laterality: N/A;   COLONOSCOPY  2008   WNL, Dr Buccini   IRRIGATION AND DEBRIDEMENT ABSCESS  ~ 2005 X 2   buttocks   LIPOMA EXCISION  ~ 04/2016   base of my neck   MASS EXCISION  03/10/2012   Procedure: EXCISION MASS;  Surgeon: Krystal JINNY Russell, MD;  Location: [facility];  Service: General;  Laterality: N/A;  Removal of back lipoma   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   SPLENECTOMY, TOTAL  1995   TOTAL ABDOMINAL HYSTERECTOMY  1995   total, endometriosis, fibroid, ovarian atrophy   Family History  Problem Relation Age of Onset   Heart disease Maternal Grandmother    Heart disease Maternal Grandfather    Heart attack Maternal Grandfather 62   Heart disease Paternal Grandmother    Heart attack Paternal Grandmother 16   Heart disease Paternal Grandfather    Cancer Paternal Grandfather        chest- smoker   Diabetes Mother        type 2- controlled by diet   Atrial fibrillation Father    Hyperlipidemia Sister    Migraines Sister    Cancer Sister        pancreatic   Depression Sister    COPD Sister    Social History   Socioeconomic History   Marital status: Single    Spouse name: Not on file   Number of children: 0   Years of education: 12   Highest education level: Not on file  Occupational History   Not on file  Tobacco Use   Smoking status: Former    Current packs/day: 0.00    Average packs/day: 0.8 packs/day  for 5.0 years (3.8 ttl pk-yrs)    Types: Cigarettes    Start date: 02/20/1978    Quit date: 02/21/1983    Years since quitting: 40.9   Smokeless tobacco: Never  Substance and Sexual Activity   Alcohol use: Yes    Alcohol/week: 12.0 standard drinks of alcohol    Types: 12 Cans of beer per week   Drug use: No   Sexual activity: Never    Comment: works as print production planner at [employer], 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   Social Drivers of Corporate Investment Banker Strain: Low Risk  (01/07/2024)   Overall Financial Resource Strain (CARDIA)    Difficulty of Paying Living Expenses: Not hard at all  Food Insecurity: No Food Insecurity (01/07/2024)   Hunger Vital Sign    Worried About Running Out of Food in the Last Year: Never true    Ran Out of Food in the Last Year: Never true  Transportation Needs: No Transportation Needs (01/07/2024)   PRAPARE - Administrator, Civil Service (Medical): No    Lack of Transportation (Non-Medical): No  Physical Activity: Inactive (01/07/2024)   Exercise Vital Sign    Days of Exercise per Week: 0 days    Minutes of Exercise per Session: 0 min  Stress: No Stress Concern Present (01/07/2024)   [employer] of Occupational Health - Occupational Stress Questionnaire    Feeling of Stress : Not at all  Social Connections: Socially Isolated (01/07/2024)   Social Connection and Isolation Panel [NHANES]    Frequency of Communication with Friends and Family: More than three times a week    Frequency of Social Gatherings with Friends and Family: More than three times a week    Attends Religious Services: Never    Database Administrator or Organizations: No    Attends Engineer, Structural: Never    Marital Status: Never married    Tobacco  Counseling Counseling given: Not Answered   Clinical Intake:  Pre-visit preparation completed: Yes  Pain : No/denies pain     BMI - recorded: 29.76 Nutritional Status: BMI 25 -29 Overweight Nutritional Risks: None Diabetes: No  How often do you need to have someone help you when you read instructions, pamphlets, or other written materials from your doctor or pharmacy?: 1 - Never  Interpreter Needed?: No  Information entered by :: Rojelio Blush LPN   Activities of Daily Living    01/07/2024   11:37 AM  In your present state of health, do you have any difficulty performing the following activities:  Hearing? 0  Vision? 0  Difficulty concentrating or making decisions? 0  Walking or climbing stairs? 0  Dressing or bathing? 0  Doing errands, shopping? 0  Preparing Food and eating ? N  Using the Toilet? N  In the past six months, have you accidently leaked urine? N  Do you have problems with loss of bowel control? N  Managing your Medications? N  Managing your Finances? N  Housekeeping or managing your Housekeeping? N    Patient Care Team: Domenica Harlene LABOR, MD as PCP - General (Family Medicine) Donnald Charleston, MD as Consulting Physician (Gastroenterology) Octavia Charleston, MD as Consulting Physician (Ophthalmology)  Indicate any recent Medical Services you may have received from other than [facility] providers in the past year (date may be approximate).     Assessment:   This is a routine wellness examination for Kafi.  Hearing/Vision screen Hearing Screening - Comments:: Denies hearing difficulties   Vision Screening - Comments:: Wears rx glasses - up to date with routine eye exams with  Deferred   Goals Addressed               This Visit's Progress     Increase physical activity (pt-stated)        Stay Active.  Depression Screen    01/07/2024   11:37 AM 08/26/2023    2:29 PM 01/02/2023   10:34 AM 11/22/2022   10:42 AM 05/22/2022    1:44 PM 12/05/2021    11:08 AM 08/24/2021   10:14 AM  PHQ 2/9 Scores  PHQ - 2 Score 0 0 0 2 0 0 2  PHQ- 9 Score  0 0 3 1 1 7     Fall Risk    01/07/2024   11:38 AM 08/26/2023    2:29 PM 01/02/2023   10:37 AM 11/22/2022   10:42 AM 05/22/2022    1:43 PM  Fall Risk   Falls in the past year? 0 0 0 0 0  Number falls in past yr: 0 0 0 0 0  Injury with Fall? 0 0 0 0 0  Risk for fall due to : No Fall Risks  Impaired vision  No Fall Risks  Follow up Falls prevention discussed Falls evaluation completed Falls prevention discussed Falls evaluation completed Falls evaluation completed    MEDICARE RISK AT HOME: Medicare Risk at Home Any stairs in or around the home?: Yes If so, are there any without handrails?: No Home free of loose throw rugs in walkways, pet beds, electrical cords, etc?: Yes Adequate lighting in your home to reduce risk of falls?: Yes Life alert?: No Use of a cane, walker or w/c?: No Grab bars in the bathroom?: Yes Shower chair or bench in shower?: No Elevated toilet seat or a handicapped toilet?: Yes  TIMED UP AND GO:  Was the test performed?  No    Cognitive Function:        01/07/2024   11:39 AM 01/02/2023   10:37 AM  6CIT Screen  What Year? 0 points 0 points  What month? 0 points 0 points  What time? 0 points 0 points  Count back from 20 0 points 0 points  Months in reverse 0 points 0 points  Repeat phrase 0 points 0 points  Total Score 0 points 0 points    Immunizations Immunization History  Administered Date(s) Administered   Fluad  Quad(high Dose 65+) 09/18/2022   Fluad  Trivalent(High Dose 65+) 08/26/2023   Influenza Inj Mdck Quad Pf 08/27/2019   Influenza Split 09/02/2012   Influenza,inj,Quad PF,6+ Mos 08/17/2013, 08/31/2014, 07/31/2016, 09/03/2017, 09/05/2018, 08/22/2020, 08/24/2021   Influenza-Unspecified 09/01/2015, 09/05/2018, 09/20/2022   PFIZER(Purple Top)SARS-COV-2 Vaccination 08/23/2020, 09/14/2020   Pneumococcal Conjugate-13 12/03/1993, 12/04/2003, 10/19/2013    Pneumococcal Polysaccharide-23 07/16/2019   Respiratory Syncytial Virus Vaccine ,Recomb Aduvanted(Arexvy ) 10/28/2023   Tdap 04/15/2013, 08/26/2023    TDAP status: Up to date  Flu Vaccine status: Up to date  Pneumococcal vaccine status: Up to date  Covid-19 vaccine status: Declined, Education has been provided regarding the importance of this vaccine but patient still declined. Advised may receive this vaccine at local pharmacy or Health Dept.or vaccine clinic. Aware to provide a copy of the vaccination record if obtained from local pharmacy or Health Dept. Verbalized acceptance and understanding.  Qualifies for Shingles Vaccine? Yes   Zostavax completed No   Shingrix Completed?: No.    Education has been provided regarding the importance of this vaccine. Patient has been advised to call insurance company to determine out of pocket expense if they have not yet received this vaccine. Advised may also receive vaccine at local pharmacy or Health Dept. Verbalized acceptance and understanding.  Screening Tests Health Maintenance  Topic Date Due   Zoster Vaccines- Shingrix (1 of 2) Never done   COVID-19 Vaccine (  3 - 2024-25 season) 08/04/2023   Pneumonia Vaccine 37+ Years old (3 of 3 - PPSV23 or PCV20) 07/15/2024   Medicare Annual Wellness (AWV)  01/06/2025   MAMMOGRAM  10/27/2025   Colonoscopy  02/16/2026   DTaP/Tdap/Td (3 - Td or Tdap) 08/25/2033   INFLUENZA VACCINE  Completed   DEXA SCAN  Completed   Hepatitis C Screening  Completed   HPV VACCINES  Aged Out    Health Maintenance  Health Maintenance Due  Topic Date Due   Zoster Vaccines- Shingrix (1 of 2) Never done   COVID-19 Vaccine (3 - 2024-25 season) 08/04/2023    Colorectal cancer screening: Type of screening: Colonoscopy. Completed 02/17/16. Repeat every 10 years  Mammogram status: Completed 10/28/23. Repeat every year  Bone Density status: Completed 10/28/23. Results reflect: Bone density results: OSTEOPENIA. Repeat  every   years.     Additional Screening:  Hepatitis C Screening: does qualify; Completed 03/12/17  Vision Screening: Recommended annual ophthalmology exams for early detection of glaucoma and other disorders of the eye. Is the patient up to date with their annual eye exam?  Yes  Who is the provider or what is the name of the office in which the patient attends annual eye exams? Deferred If pt is not established with a provider, would they like to be referred to a provider to establish care? No .   Dental Screening: Recommended annual dental exams for proper oral hygiene   Community Resource Referral / Chronic Care Management:  CRR required this visit?  No   CCM required this visit?  No     Plan:     I have personally reviewed and noted the following in the patient's chart:   Medical and social history Use of alcohol, tobacco or illicit drugs  Current medications and supplements including opioid prescriptions. Patient is not currently taking opioid prescriptions. Functional ability and status Nutritional status Physical activity Advanced directives List of other physicians Hospitalizations, surgeries, and ER visits in previous 12 months Vitals Screenings to include cognitive, depression, and falls Referrals and appointments  In addition, I have reviewed and discussed with patient certain preventive protocols, quality metrics, and best practice recommendations. A written personalized care plan for preventive services as well as general preventive health recommendations were provided to patient.     Rojelio LELON Blush, LPN   06/06/7973   After Visit Summary: (MyChart) Due to this being a telephonic visit, the after visit summary with patients personalized plan was offered to patient via MyChart   Nurse Notes: None

## 2024-02-16 NOTE — Assessment & Plan Note (Signed)
Encouraged increased hydration, 64 ounces of clear fluids daily. Minimize alcohol and caffeine. Eat small frequent meals with lean proteins and complex carbs. Avoid high and low blood sugars. Get adequate sleep, 7-8 hours a night. Needs exercise daily preferably in the morning.  

## 2024-02-16 NOTE — Assessment & Plan Note (Signed)
 Encourage heart healthy diet such as MIND or DASH diet, increase exercise, avoid trans fats, simple carbohydrates and processed foods, consider a krill or fish or flaxseed oil cap daily.

## 2024-02-16 NOTE — Assessment & Plan Note (Signed)
Stable on fluoxetine and alprazolam prn, no changes

## 2024-02-18 ENCOUNTER — Telehealth: Payer: Medicare Other | Admitting: Family Medicine

## 2024-02-18 ENCOUNTER — Encounter: Payer: Self-pay | Admitting: Family Medicine

## 2024-02-18 VITALS — BP 138/75 | HR 70

## 2024-02-18 DIAGNOSIS — F418 Other specified anxiety disorders: Secondary | ICD-10-CM | POA: Diagnosis not present

## 2024-02-18 DIAGNOSIS — E782 Mixed hyperlipidemia: Secondary | ICD-10-CM

## 2024-02-18 MED ORDER — OMEPRAZOLE 20 MG PO CPDR: 20.0000 mg | DELAYED_RELEASE_CAPSULE | Freq: Every day | ORAL | Status: AC

## 2024-02-18 MED ORDER — TRAZODONE HCL 50 MG PO TABS: 50.0000 mg | ORAL_TABLET | Freq: Every evening | ORAL | 1 refills | Status: DC | PRN

## 2024-02-18 MED ORDER — TIZANIDINE HCL 2 MG PO TABS: 1.0000 mg | ORAL_TABLET | Freq: Three times a day (TID) | ORAL | 0 refills | Status: AC | PRN

## 2024-02-18 MED ORDER — FLUOXETINE HCL 20 MG PO CAPS: 60.0000 mg | ORAL_CAPSULE | Freq: Every day | ORAL | 1 refills | Status: DC

## 2024-02-18 NOTE — Progress Notes (Signed)
 MyChart Video Visit    Virtual Visit via Video Note   This patient is at least at moderate risk for complications without adequate follow up. This format is felt to be most appropriate for this patient at this time. Physical exam was limited by quality of the video and audio technology used for the visit. Juanetta, CMA was able to get the patient set up on a video visit.  Patient location: home Patient and provider in visit Provider location: Office  I discussed the limitations of evaluation and management by telemedicine and the availability of in person appointments. The patient expressed understanding and agreed to proceed.  Visit Date: 02/18/2024  Today's healthcare provider: Danise Edge, MD  Subjective:    [name], female    DOB: 05-30-1956, 68 y.o.   MRN: [id number]  Chief Complaint  Patient presents with   Follow-up    HPI Discussed the use of AI scribe software for clinical note transcription with the patient, who gave verbal consent to proceed.  History of Present Illness Laura MIX "Pam" is a 68 year old female who presents with knee pain.  She developed knee pain after 'tweaking' her knee while helping build a deck at her home. She has been trying to rest it for the last few days, which seems to be helping. She manages the pain with Tylenol, taking it at night and in the morning. She did not take it during the day yesterday, resulting in increased discomfort by the end of the day. She prefers not to take pain medication frequently.  She has a history of a platelet disorder, with her platelets currently at 60, which has been consistent without new bleeding or bruising. She experiences low energy when her platelets fluctuate and has been managing this condition since she was 68 years old.  Her recent lab work from September showed triglycerides at 213 and a GFR of 58.64, with normal creatinine and BUN levels. She is planning to follow up with  blood work after her upcoming trip.  She is currently taking alprazolam, trazodone, fluoxetine, omeprazole (over-the-counter), and topiramate for various conditions, including basilar migraines. She is considering tapering off topiramate and plans to consult her pharmacist about it.    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  '95- blow normal levels, but stable.   Lipoma 03-04-12   Rt. back about scapular ares   Migraine    "that's what the dr thinks I had yesterday; still unsure; never had one before" (06/19/2016)   Overweight 03/06/2015   PONV (postoperative nausea and vomiting)    Preventative health care 04/17/2013; 08/17/2013   Solitary pulmonary nodule 08/17/2013   LLL seen on CT in March 2014    Past Surgical History:  Procedure Laterality Date   APPENDECTOMY  1995   BREAST BIOPSY Left 2012   needle biopsy(benign)-Titanium needle marker remains   CHOLECYSTECTOMY N/A 04/24/2013   Procedure: LAPAROSCOPIC CHOLECYSTECTOMY  WITH INTRAOPERATIVE CHOLANGIOGRAM;  Surgeon: Currie Paris, MD;  Location: [facility];  Service: General;  Laterality: N/A;   COLONOSCOPY  2008   WNL, Dr Buccini   IRRIGATION AND DEBRIDEMENT ABSCESS  ~ 2005 X 2   buttocks   LIPOMA EXCISION  ~ 04/2016   "base of my neck"   MASS EXCISION  03/10/2012   Procedure: EXCISION MASS;  Surgeon: Adolph Pollack, MD;  Location: [facility];  Service: General;  Laterality: N/A;  Removal of back lipoma   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   SPLENECTOMY, TOTAL  1995   TOTAL ABDOMINAL HYSTERECTOMY  1995   total, endometriosis, fibroid, ovarian atrophy    Family History  Problem Relation Age of Onset   Heart disease Maternal Grandmother    Heart disease Maternal Grandfather    Heart attack Maternal Grandfather 62   Heart disease Paternal Grandmother    Heart attack Paternal Grandmother 39   Heart disease Paternal Grandfather    Cancer Paternal Grandfather        chest- smoker   Diabetes Mother        type 2- controlled by diet   Atrial fibrillation Father    Hyperlipidemia Sister    Migraines Sister    Cancer Sister        pancreatic   Depression Sister    COPD Sister     Social History   Socioeconomic History   Marital status: Single    Spouse name: Not on file   Number of children: 0   Years of education: 12   Highest education level: Not on file  Occupational History   Not on file  Tobacco Use   Smoking status: Former    Current packs/day: 0.00    Average packs/day: 0.8 packs/day for 5.0 years (3.8 ttl pk-yrs)    Types: Cigarettes    Start date: 02/20/1978    Quit date: 02/21/1983    Years since quitting: 41.0   Smokeless tobacco: Never  Substance and Sexual Activity   Alcohol use: Yes    Alcohol/week: 12.0 standard drinks of alcohol    Types: 12 Cans of beer per week   Drug use: No   Sexual activity: Never    Comment: works as Print production planner at [employer], 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   Social Drivers of Corporate investment banker Strain: Low Risk  (01/07/2024)   Overall Financial Resource Strain (CARDIA)    Difficulty of Paying Living Expenses: Not hard at all  Food Insecurity: No Food Insecurity (01/07/2024)   Hunger Vital Sign    Worried About Running Out of  Food in the Last Year: Never true    Ran Out of Food in the Last Year: Never true  Transportation Needs: No Transportation Needs (01/07/2024)   PRAPARE - Administrator, Civil Service (Medical): No    Lack of Transportation (Non-Medical): No  Physical Activity: Inactive (01/07/2024)   Exercise Vital Sign    Days of Exercise per Week: 0 days    Minutes of Exercise per Session: 0 min  Stress: No Stress Concern Present (01/07/2024)   [employer] of Occupational Health - Occupational Stress Questionnaire    Feeling  of Stress : Not at all  Social Connections: Socially Isolated (01/07/2024)   Social Connection and Isolation Panel [NHANES]    Frequency of Communication with Friends and Family: More than three times a week    Frequency of Social Gatherings with Friends and Family: More than three times a week    Attends Religious Services: Never    Database administrator or Organizations: No    Attends Banker Meetings: Never    Marital Status: Never married  Intimate Partner Violence: Not At Risk (01/07/2024)   Humiliation, Afraid, Rape, and Kick questionnaire    Fear of Current or Ex-Partner: No    Emotionally Abused: No    Physically Abused: No    Sexually Abused: No    Outpatient Medications Prior to Visit  Medication Sig Dispense Refill   acetaminophen (TYLENOL) 500 MG tablet Take 1,000 mg by mouth every 6 (six) hours as needed for pain.     ALPRAZolam (XANAX) 0.25 MG tablet TAKE 1 TABLET(0.25 MG) BY MOUTH TWICE DAILY AS NEEDED FOR ANXIETY 60 tablet 1   Calcium Carb-Cholecalciferol (CALCIUM 600 + D PO) Take 1 tablet by mouth daily.     cetirizine (ZYRTEC) 10 MG tablet Take 1 tablet (10 mg total) by mouth daily as needed for allergies or rhinitis. 90 tablet 1   Cholecalciferol (VITAMIN D) 2000 units CAPS Take 2,000 Units by mouth daily.     docusate sodium (COLACE) 100 MG capsule Take 100 mg by mouth daily.     fluticasone (FLONASE) 50 MCG/ACT nasal spray Place 1  spray into both nostrils daily. 48 g 1   meclizine (ANTIVERT) 25 MG tablet Take 1 tablet (25 mg total) by mouth 3 (three) times daily as needed for dizziness. 30 tablet 0   topiramate (TOPAMAX) 25 MG tablet TAKE 1 TABLET(25 MG) BY MOUTH DAILY 90 tablet 1   FLUoxetine (PROZAC) 20 MG capsule Take 3 capsules (60 mg total) by mouth daily. 270 capsule 1   omeprazole (PRILOSEC) 20 MG capsule Take 1 capsule (20 mg total) by mouth daily. 90 capsule 1   traZODone (DESYREL) 50 MG tablet TAKE 2 TABLETS(100 MG) BY MOUTH AT BEDTIME AS NEEDED FOR SLEEP 180 tablet 1   No facility-administered medications prior to visit.    Allergies  Allergen Reactions   Atorvastatin Other (See Comments)    Joint pain and weight gain   Codeine Nausea And Vomiting   Wellbutrin [Bupropion] Anxiety    Insomnia     Review of Systems  Constitutional:  Negative for fever and malaise/fatigue.  HENT:  Negative for congestion.   Eyes:  Negative for blurred vision.  Respiratory:  Negative for shortness of breath.   Cardiovascular:  Negative for chest pain, palpitations and leg swelling.  Gastrointestinal:  Negative for abdominal pain, blood in stool and nausea.  Genitourinary:  Negative for dysuria and frequency.  Musculoskeletal:  Negative for falls.  Skin:  Negative for rash.  Neurological:  Negative for dizziness, loss of consciousness and headaches.  Endo/Heme/Allergies:  Negative for environmental allergies.  Psychiatric/Behavioral:  Negative for depression. The patient is not nervous/anxious.        Objective:    Physical Exam Constitutional:      General: She is not in acute distress.    Appearance: Normal appearance. She is not ill-appearing or toxic-appearing.  HENT:     Head: Normocephalic and atraumatic.     Right Ear: External ear normal.     Left Ear: External  ear normal.     Nose: Nose normal.  Eyes:     General:        Right eye: No discharge.        Left eye: No discharge.  Pulmonary:      Effort: Pulmonary effort is normal.  Skin:    Findings: No rash.  Neurological:     Mental Status: She is alert and oriented to person, place, and time.  Psychiatric:        Behavior: Behavior normal.     BP 138/75 Comment: Pt obtained  Pulse 70 Comment: Pt obtained Wt Readings from Last 3 Encounters:  01/07/24 190 lb (86.2 kg)  08/26/23 198 lb 12.8 oz (90.2 kg)  01/02/23 194 lb (88 kg)    Diabetic Foot Exam - Simple   No data filed    Lab Results  Component Value Date   WBC 6.2 08/26/2023   HGB 13.0 08/26/2023   HCT 40.1 08/26/2023   PLT 56.0 (L) 08/26/2023   GLUCOSE 91 08/26/2023   CHOL 201 (H) 08/26/2023   TRIG 213.0 (H) 08/26/2023   HDL 58.10 08/26/2023   LDLDIRECT 114.0 01/19/2020   LDLCALC 100 (H) 08/26/2023   ALT 10 08/26/2023   AST 14 08/26/2023   NA 139 08/26/2023   K 3.7 08/26/2023   CL 105 08/26/2023   CREATININE 1.00 08/26/2023   BUN 17 08/26/2023   CO2 27 08/26/2023   TSH 1.50 08/26/2023   INR 0.89 05/14/2018   HGBA1C 5.2 05/21/2022    Lab Results  Component Value Date   TSH 1.50 08/26/2023   Lab Results  Component Value Date   WBC 6.2 08/26/2023   HGB 13.0 08/26/2023   HCT 40.1 08/26/2023   MCV 99.8 08/26/2023   PLT 56.0 (L) 08/26/2023   Lab Results  Component Value Date   NA 139 08/26/2023   K 3.7 08/26/2023   CO2 27 08/26/2023   GLUCOSE 91 08/26/2023   BUN 17 08/26/2023   CREATININE 1.00 08/26/2023   BILITOT 0.4 08/26/2023   ALKPHOS 93 08/26/2023   AST 14 08/26/2023   ALT 10 08/26/2023   PROT 7.2 08/26/2023   ALBUMIN 4.1 08/26/2023   CALCIUM 9.2 08/26/2023   ANIONGAP 7 06/19/2016   GFR 58.64 (L) 08/26/2023   Lab Results  Component Value Date   CHOL 201 (H) 08/26/2023   Lab Results  Component Value Date   HDL 58.10 08/26/2023   Lab Results  Component Value Date   LDLCALC 100 (H) 08/26/2023   Lab Results  Component Value Date   TRIG 213.0 (H) 08/26/2023   Lab Results  Component Value Date   CHOLHDL 3  08/26/2023   Lab Results  Component Value Date   HGBA1C 5.2 05/21/2022       Assessment & Plan:  Hyperlipidemia, mixed Assessment & Plan: Encourage heart healthy diet such as MIND or DASH diet, increase exercise, avoid trans fats, simple carbohydrates and processed foods, consider a krill or fish or flaxseed oil cap daily.    Depression with anxiety Assessment & Plan: Stable on fluoxetine and alprazolam prn, no changes   Other orders -     tiZANidine HCl; Take 0.5-2 tablets (1-4 mg total) by mouth every 8 (eight) hours as needed for muscle spasms.  Dispense: 20 tablet; Refill: 0 -     traZODone HCl; Take 1-2 tablets (50-100 mg total) by mouth at bedtime as needed for sleep.  Dispense: 180 tablet; Refill: 1 -  FLUoxetine HCl; Take 3 capsules (60 mg total) by mouth daily.  Dispense: 270 capsule; Refill: 1 -     Omeprazole; Take 1 capsule (20 mg total) by mouth daily.    Assessment and Plan Assessment & Plan Knee pain Pain likely due to overexertion. Managed with acetaminophen. Discussed tizanidine for additional relief. Emphasized maintaining activity while adjusting intensity. - Advise acetaminophen 1,000 mg twice daily, option to increase to three times daily if needed. - Prescribe tizanidine 2 mg tablets, take 1-2 mg during the day and 2-4 mg at bedtime as needed. - Recommend moist heat application and topical rubs such as Biofreeze, Voltaren, or Aspercreme. - Suggest using CBD cream for muscle spasms if available.  Idiopathic thrombocytopenia Platelet count at 56, consistent with condition. No new bleeding or bruising reported. - Schedule blood work to monitor platelet count and other relevant markers.  Hyperlipidemia, mixed Triglycerides elevated at 213. Plan to monitor with follow-up blood work. - Schedule follow-up blood work to monitor lipid levels.  General Health Maintenance Due for Medicare wellness visit in February 2026. Introduced new Publishing rights manager,  Shanda Bumps. Discussed scheduling challenges. - Schedule physical with nurse practitioner Shanda Bumps at the end of September. - Plan follow-up visit between December and February. - Arrange for blood work at [facility] or [facility] to monitor health markers.  Medication Management Requires refills for trazodone and fluoxetine. Managing medications well. Discussed tapering off topiramate with pharmacist consultation. - Send prescription for trazodone 100 mg, 1-2 tablets at bedtime as needed, 180-day supply with one refill. - Send prescription for fluoxetine 60 mg daily, with a refill. - Change omeprazole to over-the-counter status. - Advise on tapering off topiramate with pharmacist consultation for dose splitting.     Danise Edge, MD

## 2024-02-24 ENCOUNTER — Telehealth: Payer: Medicare Other | Admitting: Family Medicine

## 2024-04-05 DIAGNOSIS — R0981 Nasal congestion: Secondary | ICD-10-CM | POA: Diagnosis not present

## 2024-04-05 DIAGNOSIS — R059 Cough, unspecified: Secondary | ICD-10-CM | POA: Diagnosis not present

## 2024-04-05 DIAGNOSIS — J209 Acute bronchitis, unspecified: Secondary | ICD-10-CM | POA: Diagnosis not present

## 2024-05-13 ENCOUNTER — Other Ambulatory Visit: Payer: Self-pay | Admitting: Family Medicine

## 2024-05-14 NOTE — Telephone Encounter (Signed)
 Requesting: Xanax  0.25 mg  Contract: 08/26/2023 UDS: N/A Last Visit: 02/18/2024 Next Visit: N/A Last Refill: 12/16/2023  Please Advise

## 2024-05-15 NOTE — Telephone Encounter (Signed)
 Patient was advised and scheduled appointment on 08/18/24.

## 2024-06-24 ENCOUNTER — Encounter: Payer: Self-pay | Admitting: Family Medicine

## 2024-06-24 ENCOUNTER — Other Ambulatory Visit: Payer: Self-pay | Admitting: Family Medicine

## 2024-06-24 MED ORDER — TOPIRAMATE 25 MG PO TABS: 25.0000 mg | ORAL_TABLET | Freq: Every day | ORAL | 1 refills | Status: DC

## 2024-06-25 ENCOUNTER — Other Ambulatory Visit: Payer: Self-pay | Admitting: Family

## 2024-06-25 MED ORDER — SCOPOLAMINE 1 MG/3DAYS TD PT72: 1.0000 | MEDICATED_PATCH | TRANSDERMAL | 0 refills | Status: AC

## 2024-08-18 ENCOUNTER — Telehealth: Admitting: Family Medicine

## 2024-09-13 ENCOUNTER — Encounter: Payer: Self-pay | Admitting: Family Medicine

## 2024-09-14 MED ORDER — TRAZODONE HCL 50 MG PO TABS: 50.0000 mg | ORAL_TABLET | Freq: Every evening | ORAL | 1 refills | Status: AC | PRN

## 2024-09-25 MED ORDER — TOPIRAMATE 25 MG PO TABS: 25.0000 mg | ORAL_TABLET | Freq: Every day | ORAL | 1 refills | Status: AC

## 2024-09-25 NOTE — Telephone Encounter (Signed)
 Requesting: alprazolam  0.25mg  Contract: No UDS: will get at next visit Last Visit: 02/18/24 Next Visit: 10/26/2024 Last Refill: 05/14/24  Please Advise

## 2024-09-25 NOTE — Addendum Note (Signed)
 Addended by: ESTELLE GILLIS D on: 09/25/2024 12:38 PM   Modules accepted: Orders

## 2024-09-26 ENCOUNTER — Other Ambulatory Visit: Payer: Self-pay | Admitting: Family

## 2024-09-26 MED ORDER — ALPRAZOLAM 0.25 MG PO TABS: 0.2500 mg | ORAL_TABLET | Freq: Two times a day (BID) | ORAL | 1 refills | Status: AC | PRN

## 2024-10-25 DIAGNOSIS — M8580 Other specified disorders of bone density and structure, unspecified site: Secondary | ICD-10-CM | POA: Insufficient documentation

## 2024-10-25 NOTE — Assessment & Plan Note (Signed)
 Patient encouraged to maintain heart healthy diet, regular exercise, adequate sleep. Consider daily probiotics. Take medications as prescribed. Labs reviewed. Dexa scan ordered today. Shingrix is the new shingles shot, 2 shots over 2-6 months. Flu and covid booster.  Colonoscopy 2017 repeat in 2027 Pap TAH in mid 30s no paps presently Mgm 10/2023 repeat every 1-2 years Dexa 10/2023 repeat every 2-5 years Shingrix is the new shingles shot, 2 shots over 2-6 months, confirm coverage with insurance and document, then can return here for shots with nurse appt or at pharmacy

## 2024-10-25 NOTE — Assessment & Plan Note (Signed)
Avoid offending foods, start probiotics. Do not eat large meals in late evening and consider raising head of bed.  

## 2024-10-25 NOTE — Assessment & Plan Note (Signed)
Stable and asymptomatic will continue to monitor

## 2024-10-25 NOTE — Assessment & Plan Note (Signed)
 Encouraged to get adequate exercise, calcium and vitamin d intake

## 2024-10-25 NOTE — Assessment & Plan Note (Signed)
 Encourage heart healthy diet such as MIND or DASH diet, increase exercise, avoid trans fats, simple carbohydrates and processed foods, consider a krill or fish or flaxseed oil cap daily.

## 2024-10-26 ENCOUNTER — Encounter: Payer: Self-pay | Admitting: Family Medicine

## 2024-10-26 ENCOUNTER — Ambulatory Visit: Admitting: Family Medicine

## 2024-10-26 ENCOUNTER — Ambulatory Visit: Payer: Self-pay | Admitting: Family Medicine

## 2024-10-26 ENCOUNTER — Telehealth: Payer: Self-pay

## 2024-10-26 VITALS — BP 132/74 | HR 65 | Temp 97.9°F | Resp 16 | Ht 67.0 in | Wt 192.2 lb

## 2024-10-26 DIAGNOSIS — Z23 Encounter for immunization: Secondary | ICD-10-CM

## 2024-10-26 DIAGNOSIS — M8580 Other specified disorders of bone density and structure, unspecified site: Secondary | ICD-10-CM | POA: Diagnosis not present

## 2024-10-26 DIAGNOSIS — E782 Mixed hyperlipidemia: Secondary | ICD-10-CM | POA: Diagnosis not present

## 2024-10-26 DIAGNOSIS — Z1231 Encounter for screening mammogram for malignant neoplasm of breast: Secondary | ICD-10-CM

## 2024-10-26 DIAGNOSIS — K219 Gastro-esophageal reflux disease without esophagitis: Secondary | ICD-10-CM

## 2024-10-26 DIAGNOSIS — Z0000 Encounter for general adult medical examination without abnormal findings: Secondary | ICD-10-CM

## 2024-10-26 DIAGNOSIS — D693 Immune thrombocytopenic purpura: Secondary | ICD-10-CM | POA: Diagnosis not present

## 2024-10-26 LAB — LIPID PANEL
Cholesterol: 233 mg/dL — ABNORMAL HIGH (ref 0–200)
HDL: 71.2 mg/dL (ref >39.00)
LDL Cholesterol: 124 mg/dL — ABNORMAL HIGH (ref 0–99)
NonHDL: 161.38
Total CHOL/HDL Ratio: 3
Triglycerides: 189 mg/dL — ABNORMAL HIGH (ref 0.0–149.0)
VLDL: 37.8 mg/dL (ref 0.0–40.0)

## 2024-10-26 LAB — COMPREHENSIVE METABOLIC PANEL WITH GFR
ALT: 12 U/L (ref 0–35)
AST: 20 U/L (ref 0–37)
Albumin: 4.6 g/dL (ref 3.5–5.2)
Alkaline Phosphatase: 80 U/L (ref 39–117)
BUN: 17 mg/dL (ref 6–23)
CO2: 29 meq/L (ref 19–32)
Calcium: 9.7 mg/dL (ref 8.4–10.5)
Chloride: 103 meq/L (ref 96–112)
Creatinine, Ser: 0.93 mg/dL (ref 0.40–1.20)
GFR: 63.45 mL/min (ref >60.00)
Glucose, Bld: 85 mg/dL (ref 70–99)
Potassium: 4.4 meq/L (ref 3.5–5.1)
Sodium: 138 meq/L (ref 135–145)
Total Bilirubin: 0.4 mg/dL (ref 0.2–1.2)
Total Protein: 7.9 g/dL (ref 6.0–8.3)

## 2024-10-26 LAB — CBC WITH DIFFERENTIAL/PLATELET
Basophils Absolute: 0.1 K/uL (ref 0.0–0.1)
Basophils Relative: 1.4 % (ref 0.0–3.0)
Eosinophils Absolute: 0.6 K/uL (ref 0.0–0.7)
Eosinophils Relative: 9.3 % — ABNORMAL HIGH (ref 0.0–5.0)
HCT: 41 % (ref 36.0–46.0)
Hemoglobin: 13.8 g/dL (ref 12.0–15.0)
Lymphocytes Relative: 31.6 % (ref 12.0–46.0)
Lymphs Abs: 2 K/uL (ref 0.7–4.0)
MCHC: 33.6 g/dL (ref 30.0–36.0)
MCV: 99.4 fl (ref 78.0–100.0)
Monocytes Absolute: 0.8 K/uL (ref 0.1–1.0)
Monocytes Relative: 12.1 % — ABNORMAL HIGH (ref 3.0–12.0)
Neutro Abs: 3 K/uL (ref 1.4–7.7)
Neutrophils Relative %: 45.6 % (ref 43.0–77.0)
Platelets: 47 K/uL — CL (ref 150.0–400.0)
RBC: 4.12 Mil/uL (ref 3.87–5.11)
RDW: 15.3 % (ref 11.5–15.5)
WBC: 6.5 K/uL (ref 4.0–10.5)

## 2024-10-26 LAB — TSH: TSH: 1.78 u[IU]/mL (ref 0.35–5.50)

## 2024-10-26 MED ORDER — ESTRADIOL 0.5 MG PO TABS: 0.5000 mg | ORAL_TABLET | Freq: Every day | ORAL | 2 refills | Status: AC

## 2024-10-26 NOTE — Progress Notes (Signed)
 Subjective:    [name], female    DOB: [birth year], 68 y.o.   MRN: [id number]  Chief Complaint  Patient presents with   Medical Management of Chronic Issues    Patient presents today for a 5 month follow-up   Quality Metric Gaps    Zoster, pneumococcal vaccine    HPI Discussed the use of AI scribe software for clinical note transcription with the patient, who gave verbal consent to proceed.  History of Present Illness Laura Bender is a 68 year old female who presents with hip pain and hot flashes.  She experiences increased hip pain, described as achy and cold, particularly when sleeping on her sides, with stiffness noted upon waking. 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 Although a workup for this issue has not yet been pursued, the option was discussed with her.  Her hot flashes have worsened over the past year, occurring randomly and sometimes causing nausea. She copes with these symptoms by carrying a fan and using a cold rag.  She notes a decreased appetite and consumes a lot of soft drinks, believing they help settle her stomach. She also drinks a lot of water and occasionally has regular Pepsi with meals.  She had a prolonged respiratory illness after a cruise in August, lasting about six weeks, treated with a Z-Pak, steroids, and cough pills. She has since recovered but does not feel completely back to normal.  No recent changes in bowel or urinary habits, no chest pain, and no recent infections or fevers. She denies recent bleeding episodes, stomach troubles, or chest pains.    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 meds   Depression with anxiety 04/17/2013   ETOH abuse    Fibromyalgia 03-04-12   Sporadic pain   GERD  (gastroesophageal reflux disease) 08/17/2013   History of transfusion of platelets X 1   related to ITP   Hyperlipidemia, mixed 10/16/2015   ITP (idiopathic thrombocytopenic purpura) 03-04-12   Dx.  '95- blow normal levels, but stable.   Lipoma 03-04-12   Rt. back about scapular ares   Migraine    that's what the dr thinks I had yesterday; still unsure; never had one before (06/19/2016)   Overweight 03/06/2015   PONV (postoperative nausea and vomiting)    Preventative health care 04/17/2013; 08/17/2013   Solitary pulmonary nodule 08/17/2013   LLL seen on CT in March 2014    Past Surgical History:  Procedure Laterality Date   APPENDECTOMY  1995   BREAST BIOPSY Left 2012   needle biopsy(benign)-Titanium needle marker remains   CHOLECYSTECTOMY N/A 04/24/2013   Procedure: LAPAROSCOPIC CHOLECYSTECTOMY WITH INTRAOPERATIVE CHOLANGIOGRAM;  Surgeon: Sherlean JINNY Laughter, MD;  Location: [facility];  Service: General;  Laterality: N/A;   COLONOSCOPY  2008   WNL, Dr Buccini   IRRIGATION AND DEBRIDEMENT ABSCESS  ~ 2005 X 2   buttocks   LIPOMA EXCISION  ~ 04/2016   base of my neck   MASS EXCISION  03/10/2012   Procedure: EXCISION MASS;  Surgeon: Krystal JINNY Russell, MD;  Location: [facility];  Service: General;  Laterality: N/A;  Removal of back lipoma   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   SPLENECTOMY,  TOTAL  1995   TOTAL ABDOMINAL HYSTERECTOMY  1995   total, endometriosis, fibroid, ovarian atrophy    Family History  Problem Relation Age of Onset   Heart disease Maternal Grandmother    Heart disease Maternal Grandfather    Heart attack Maternal Grandfather 69   Heart disease Paternal Grandmother    Heart attack Paternal Grandmother 36   Heart disease Paternal Grandfather    Cancer Paternal Grandfather        chest- smoker   Diabetes Mother        type 2- controlled by diet   Atrial fibrillation Father    Hyperlipidemia Sister    Migraines Sister    Cancer Sister        pancreatic    Depression Sister    COPD Sister     Social History   Socioeconomic History   Marital status: Single    Spouse name: Not on file   Number of children: 0   Years of education: 12   Highest education level: Not on file  Occupational History   Not on file  Tobacco Use   Smoking status: Former    Current packs/day: 0.00    Average packs/day: 0.8 packs/day for 5.0 years (3.8 ttl pk-yrs)    Types: Cigarettes    Start date: 02/20/1978    Quit date: 02/21/1983    Years since quitting: 41.7   Smokeless tobacco: Never  Substance and Sexual Activity   Alcohol use: Yes    Alcohol/week: 12.0 standard drinks of alcohol    Types: 12 Cans of beer per week   Drug use: No   Sexual activity: Never    Comment: works as print production planner at [employer], 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   Social Drivers of Corporate Investment Banker Strain: Low Risk  (01/07/2024)   Overall Financial Resource Strain (CARDIA)    Difficulty of Paying Living Expenses: Not hard at all  Food Insecurity: No Food Insecurity (01/07/2024)   Hunger Vital Sign    Worried About Running Out of Food in the Last Year: Never true    Ran Out of Food in the Last Year: Never true  Transportation Needs: No Transportation Needs (01/07/2024)   PRAPARE - Administrator, Civil Service (Medical): No    Lack of Transportation (Non-Medical): No  Physical Activity: Inactive (01/07/2024)   Exercise Vital Sign    Days of Exercise per Week: 0 days    Minutes of Exercise per Session: 0 min  Stress: No Stress Concern Present (01/07/2024)   [employer] of Occupational Health - Occupational Stress Questionnaire    Feeling of Stress : Not at all  Social Connections: Socially Isolated (01/07/2024)   Social Connection and Isolation Panel     Frequency of Communication with Friends and Family: More than three times a week    Frequency of Social Gatherings with Friends and Family: More than three times a week    Attends Religious Services: Never    Database Administrator or Organizations: No    Attends Banker Meetings: Never    Marital Status: Never married  Intimate Partner Violence: Not At Risk (01/07/2024)   Humiliation, Afraid, Rape, and Kick questionnaire  Fear of Current or Ex-Partner: No    Emotionally Abused: No    Physically Abused: No    Sexually Abused: No    Outpatient Medications Prior to Visit  Medication Sig Dispense Refill   acetaminophen  (TYLENOL ) 500 MG tablet Take 1,000 mg by mouth every 6 (six) hours as needed for pain.     ALPRAZolam  (XANAX ) 0.25 MG tablet Take 1 tablet (0.25 mg total) by mouth 2 (two) times daily as needed for anxiety. 60 tablet 1   Calcium  Carb-Cholecalciferol (CALCIUM  600 + D PO) Take 1 tablet by mouth daily.     cetirizine  (ZYRTEC ) 10 MG tablet Take 1 tablet (10 mg total) by mouth daily as needed for allergies or rhinitis. 90 tablet 1   Cholecalciferol (VITAMIN D) 2000 units CAPS Take 2,000 Units by mouth daily.     docusate sodium  (COLACE) 100 MG capsule Take 100 mg by mouth daily.     FLUoxetine  (PROZAC ) 20 MG capsule Take 3 capsules (60 mg total) by mouth daily. 270 capsule 1   fluticasone  (FLONASE ) 50 MCG/ACT nasal spray Place 1 spray into both nostrils daily. 48 g 1   meclizine  (ANTIVERT ) 25 MG tablet Take 1 tablet (25 mg total) by mouth 3 (three) times daily as needed for dizziness. 30 tablet 0   omeprazole  (PRILOSEC) 20 MG capsule Take 1 capsule (20 mg total) by mouth daily.     scopolamine  (TRANSDERM-SCOP) 1 MG/3DAYS Place 1 patch (1.5 mg total) onto the skin every 3 (three) days. 5 patch 0   tiZANidine  (ZANAFLEX ) 2 MG tablet Take 0.5-2 tablets (1-4 mg total) by mouth every 8 (eight) hours as needed for muscle spasms. 20 tablet 0   topiramate  (TOPAMAX ) 25 MG  tablet Take 1 tablet (25 mg total) by mouth daily. 90 tablet 1   traZODone  (DESYREL ) 50 MG tablet Take 1-2 tablets (50-100 mg total) by mouth at bedtime as needed for sleep. 180 tablet 1   No facility-administered medications prior to visit.    Allergies  Allergen Reactions   Atorvastatin  Other (See Comments)    Joint pain and weight gain   Codeine Nausea And Vomiting   Wellbutrin  [Bupropion ] Anxiety    Insomnia     Review of Systems  Constitutional:  Negative for chills, fever and malaise/fatigue.  HENT:  Negative for congestion and hearing loss.   Eyes:  Negative for discharge.  Respiratory:  Negative for cough, sputum production and shortness of breath.   Cardiovascular:  Negative for chest pain, palpitations and leg swelling.  Gastrointestinal:  Positive for heartburn. Negative for abdominal pain, blood in stool, constipation, diarrhea, nausea and vomiting.  Genitourinary:  Negative for dysuria, frequency, hematuria and urgency.  Musculoskeletal:  Positive for joint pain and myalgias. Negative for back pain and falls.  Skin:  Negative for rash.  Neurological:  Negative for dizziness, sensory change, loss of consciousness, weakness and headaches.  Endo/Heme/Allergies:  Negative for environmental allergies. Does not bruise/bleed easily.  Psychiatric/Behavioral:  Negative for depression and suicidal ideas. The patient is not nervous/anxious and does not have insomnia.        Objective:    Physical Exam Constitutional:      General: She is not in acute distress.    Appearance: Normal appearance. She is not diaphoretic.  HENT:     Head: Normocephalic and atraumatic.     Right Ear: Tympanic membrane, ear canal and external ear normal.     Left Ear: Tympanic membrane, ear canal and external ear normal.  Nose: Nose normal.     Mouth/Throat:     Mouth: Mucous membranes are moist.     Pharynx: Oropharynx is clear. No oropharyngeal exudate.  Eyes:     General: No scleral  icterus.       Right eye: No discharge.        Left eye: No discharge.     Conjunctiva/sclera: Conjunctivae normal.     Pupils: Pupils are equal, round, and reactive to light.  Neck:     Thyroid : No thyromegaly.  Cardiovascular:     Rate and Rhythm: Normal rate and regular rhythm.     Heart sounds: Normal heart sounds. No murmur heard. Pulmonary:     Effort: Pulmonary effort is normal. No respiratory distress.     Breath sounds: Normal breath sounds. No wheezing or rales.  Abdominal:     General: Bowel sounds are normal. There is no distension.     Palpations: Abdomen is soft. There is no mass.     Tenderness: There is no abdominal tenderness.  Musculoskeletal:        General: No tenderness. Normal range of motion.     Cervical back: Normal range of motion and neck supple.  Lymphadenopathy:     Cervical: No cervical adenopathy.  Skin:    General: Skin is warm and dry.     Findings: No rash.  Neurological:     General: No focal deficit present.     Mental Status: She is alert and oriented to person, place, and time.     Cranial Nerves: No cranial nerve deficit.     Coordination: Coordination normal.     Deep Tendon Reflexes: Reflexes are normal and symmetric. Reflexes normal.  Psychiatric:        Mood and Affect: Mood normal.        Behavior: Behavior normal.        Thought Content: Thought content normal.        Judgment: Judgment normal.     BP 132/74   Pulse 65   Temp 97.9 F (36.6 C)   Resp 16   Ht 5' 7 (1.702 m)   Wt 192 lb 3.2 oz (87.2 kg)   SpO2 95%   BMI 30.10 kg/m  Wt Readings from Last 3 Encounters:  10/26/24 192 lb 3.2 oz (87.2 kg)  01/07/24 190 lb (86.2 kg)  08/26/23 198 lb 12.8 oz (90.2 kg)    Diabetic Foot Exam - Simple   No data filed    Lab Results  Component Value Date   WBC 6.2 08/26/2023   HGB 13.0 08/26/2023   HCT 40.1 08/26/2023   PLT 56.0 (L) 08/26/2023   GLUCOSE 91 08/26/2023   CHOL 201 (H) 08/26/2023   TRIG 213.0 (H)  08/26/2023   HDL 58.10 08/26/2023   LDLDIRECT 114.0 01/19/2020   LDLCALC 100 (H) 08/26/2023   ALT 10 08/26/2023   AST 14 08/26/2023   NA 139 08/26/2023   K 3.7 08/26/2023   CL 105 08/26/2023   CREATININE 1.00 08/26/2023   BUN 17 08/26/2023   CO2 27 08/26/2023   TSH 1.50 08/26/2023   INR 0.89 05/14/2018   HGBA1C 5.2 05/21/2022    Lab Results  Component Value Date   TSH 1.50 08/26/2023   Lab Results  Component Value Date   WBC 6.2 08/26/2023   HGB 13.0 08/26/2023   HCT 40.1 08/26/2023   MCV 99.8 08/26/2023   PLT 56.0 (L) 08/26/2023   Lab Results  Component  Value Date   NA 139 08/26/2023   K 3.7 08/26/2023   CO2 27 08/26/2023   GLUCOSE 91 08/26/2023   BUN 17 08/26/2023   CREATININE 1.00 08/26/2023   BILITOT 0.4 08/26/2023   ALKPHOS 93 08/26/2023   AST 14 08/26/2023   ALT 10 08/26/2023   PROT 7.2 08/26/2023   ALBUMIN 4.1 08/26/2023   CALCIUM  9.2 08/26/2023   ANIONGAP 7 06/19/2016   GFR 58.64 (L) 08/26/2023   Lab Results  Component Value Date   CHOL 201 (H) 08/26/2023   Lab Results  Component Value Date   HDL 58.10 08/26/2023   Lab Results  Component Value Date   LDLCALC 100 (H) 08/26/2023   Lab Results  Component Value Date   TRIG 213.0 (H) 08/26/2023   Lab Results  Component Value Date   CHOLHDL 3 08/26/2023   Lab Results  Component Value Date   HGBA1C 5.2 05/21/2022       Assessment & Plan:  Preventative health care Assessment & Plan: Patient encouraged to maintain heart healthy diet, regular exercise, adequate sleep. Consider daily probiotics. Take medications as prescribed. Labs reviewed. Dexa scan ordered today. Shingrix is the new shingles shot, 2 shots over 2-6 months. Flu and covid booster.  Colonoscopy 2017 repeat in 2027 Pap TAH in mid 30s no paps presently Mgm 10/2023 repeat every 1-2 years Dexa 10/2023 repeat every 2-5 years Shingrix is the new shingles shot, 2 shots over 2-6 months, confirm coverage with insurance and document,  then can return here for shots with nurse appt or at pharmacy      Orders: -     Comprehensive metabolic panel with GFR -     CBC with Differential/Platelet -     TSH -     Lipid panel  Osteopenia, unspecified location Assessment & Plan: Encouraged to get adequate exercise, calcium  and vitamin d intake    Idiopathic thrombocytopenia (HCC) Assessment & Plan: Stable and asymptomatic will continue to monitor  Orders: -     CBC with Differential/Platelet  Hyperlipidemia, mixed Assessment & Plan: Encourage heart healthy diet such as MIND or DASH diet, increase exercise, avoid trans fats, simple carbohydrates and processed foods, consider a krill or fish or flaxseed oil cap daily.   Orders: -     Comprehensive metabolic panel with GFR -     TSH -     Lipid panel  Gastroesophageal reflux disease, unspecified whether esophagitis present Assessment & Plan: Avoid offending foods, start probiotics. Do not eat large meals in late evening and consider raising head of bed.    Need for influenza vaccination -     Flu vaccine HIGH DOSE PF(Fluzone Trivalent)  Breast cancer screening by mammogram -     3D Screening Mammogram, Left and Right; Future  Need for pneumococcal 20-valent conjugate vaccination -     Pneumococcal conjugate vaccine 20-valent  Other orders -     Estradiol ; Take 1 tablet (0.5 mg total) by mouth daily.  Dispense: 30 tablet; Refill: 2    Assessment and Plan Assessment & Plan Adult Wellness Visit Routine wellness visit with no new or pressing issues. Discussed lifestyle choices, including alcohol consumption and its impact on health. Emphasized the importance of maintaining independence and managing stress for longevity. - Continue current lifestyle choices with moderation in alcohol consumption. - Encouraged regular exercise and stress management.  Hip osteoarthritis Increased hip pain and stiffness, particularly in the morning and after sleeping on sides,  attributed to past work  and age. Discussed non-pharmacological interventions such as exercise and stretching. Considered x-rays and orthopedist referral if symptoms worsen. - Recommended regular exercise, stretching, yoga, and tai chi. - Suggested chair yoga as a low-impact exercise option. - Will consider x-rays and orthopedist referral if symptoms worsen.  Menopausal symptoms (hot flashes, nausea) Worsening hot flashes and nausea, occurring randomly and more intensely over the past year. Discussed hormone replacement therapy (HRT) with estrogen, considering risks such as blood clots and cancer. Emphasized dietary modifications to reduce carbohydrate intake and the benefits of pairing carbohydrates with protein. - Prescribed lowest dose of Estrace  (estrogen) with refills. - Advised dietary modifications to reduce carbohydrate intake and pair with protein. - Encouraged regular exercise. - Will reevaluate symptoms in one month.  Immunization management (Prevnar 20) Due for Prevnar 20 booster, five years post-Prevnar 13 and Pneumovax. Discussed benefits of Prevnar 20 over previous vaccines. - Administered Prevnar 20 booster.  Screening mammogram for breast cancer Mammogram due, approximately one year since last screening. No current breast concerns reported. - Ordered screening mammogram with a one-year expiration.  Recording duration: 26 minutes     Harlene Horton, MD

## 2024-10-26 NOTE — Telephone Encounter (Signed)
 CRITICAL VALUE STICKER  CRITICAL VALUE: Platelet count- 47,000  MESSENGER (representative from lab): [id number]

## 2024-10-26 NOTE — Patient Instructions (Addendum)
 Chair Yoga   Preventive Care 65 Years and Older, Female Preventive care refers to lifestyle choices and visits with your health care provider that can promote health and wellness. Preventive care visits are also called wellness exams. What can I expect for my preventive care visit? Counseling Your health care provider may ask you questions about your: Medical history, including: Past medical problems. Family medical history. Pregnancy and menstrual history. History of falls. Current health, including: Memory and ability to understand (cognition). Emotional well-being. Home life and relationship well-being. Sexual activity and sexual health. Lifestyle, including: Alcohol, nicotine or tobacco, and drug use. Access to firearms. Diet, exercise, and sleep habits. Work and work astronomer. Sunscreen use. Safety issues such as seatbelt and bike helmet use. Physical exam Your health care provider will check your: Height and weight. These may be used to calculate your BMI (body mass index). BMI is a measurement that tells if you are at a healthy weight. Waist circumference. This measures the distance around your waistline. This measurement also tells if you are at a healthy weight and may help predict your risk of certain diseases, such as type 2 diabetes and high blood pressure. Heart rate and blood pressure. Body temperature. Skin for abnormal spots. What immunizations do I need?  Vaccines are usually given at various ages, according to a schedule. Your health care provider will recommend vaccines for you based on your age, medical history, and lifestyle or other factors, such as travel or where you work. What tests do I need? Screening Your health care provider may recommend screening tests for certain conditions. This may include: Lipid and cholesterol levels. Hepatitis C test. Hepatitis B test. HIV (human immunodeficiency virus) test. STI (sexually transmitted infection)  testing, if you are at risk. Lung cancer screening. Colorectal cancer screening. Diabetes screening. This is done by checking your blood sugar (glucose) after you have not eaten for a while (fasting). Mammogram. Talk with your health care provider about how often you should have regular mammograms. BRCA-related cancer screening. This may be done if you have a family history of breast, ovarian, tubal, or peritoneal cancers. Bone density scan. This is done to screen for osteoporosis. Talk with your health care provider about your test results, treatment options, and if necessary, the need for more tests. Follow these instructions at home: Eating and drinking  Eat a diet that includes fresh fruits and vegetables, whole grains, lean protein, and low-fat dairy products. Limit your intake of foods with high amounts of sugar, saturated fats, and salt. Take vitamin and mineral supplements as recommended by your health care provider. Do not drink alcohol if your health care provider tells you not to drink. If you drink alcohol: Limit how much you have to 0-1 drink a day. Know how much alcohol is in your drink. In the [place] one drink equals one 12 oz bottle of beer (355 mL), one 5 oz glass of wine (148 mL), or one 1 oz glass of hard liquor (44 mL). Lifestyle Brush your teeth every morning and night with fluoride  toothpaste. Floss one time each day. Exercise for at least 30 minutes 5 or more days each week. Do not use any products that contain nicotine or tobacco. These products include cigarettes, chewing tobacco, and vaping devices, such as e-cigarettes. If you need help quitting, ask your health care provider. Do not use drugs. If you are sexually active, practice safe sex. Use a condom or other form of protection in order to prevent STIs. Take aspirin   only as told by your health care provider. Make sure that you understand how much to take and what form to take. Work with your health care provider  to find out whether it is safe and beneficial for you to take aspirin  daily. Ask your health care provider if you need to take a cholesterol-lowering medicine (statin). Find healthy ways to manage stress, such as: Meditation, yoga, or listening to music. Journaling. Talking to a trusted person. Spending time with friends and family. Minimize exposure to UV radiation to reduce your risk of skin cancer. Safety Always wear your seat belt while driving or riding in a vehicle. Do not drive: If you have been drinking alcohol. Do not ride with someone who has been drinking. When you are tired or distracted. While texting. If you have been using any mind-altering substances or drugs. Wear a helmet and other protective equipment during sports activities. If you have firearms in your house, make sure you follow all gun safety procedures. What's next? Visit your health care provider once a year for an annual wellness visit. Ask your health care provider how often you should have your eyes and teeth checked. Stay up to date on all vaccines. This information is not intended to replace advice given to you by your health care provider. Make sure you discuss any questions you have with your health care provider. Document Revised: 05/17/2021 Document Reviewed: 05/17/2021 Elsevier Patient Education  2024 [employer].

## 2024-10-28 ENCOUNTER — Other Ambulatory Visit: Payer: Self-pay | Admitting: Family

## 2024-10-28 MED ORDER — EZETIMIBE 10 MG PO TABS: 10.0000 mg | ORAL_TABLET | Freq: Every day | ORAL | 3 refills | Status: AC

## 2024-12-10 ENCOUNTER — Other Ambulatory Visit: Payer: Self-pay | Admitting: Family Medicine

## 2025-01-12 ENCOUNTER — Ambulatory Visit: Payer: Medicare Other

## 2025-01-19 ENCOUNTER — Ambulatory Visit

## 2025-01-19 ENCOUNTER — Ambulatory Visit (HOSPITAL_BASED_OUTPATIENT_CLINIC_OR_DEPARTMENT_OTHER): Admitting: Radiology

## 2025-04-29 ENCOUNTER — Ambulatory Visit: Admitting: Family Medicine

## 2025-05-18 ENCOUNTER — Encounter: Admitting: Student
